# Patient Record
Sex: Male | Born: 1935 | Race: White | Hispanic: No | Marital: Married | Smoking: Former smoker
Health system: Southern US, Community
[De-identification: ages and names within clinical notes are randomized; demographics above are authoritative.]

## PROBLEM LIST (undated history)

## (undated) DIAGNOSIS — L570 Actinic keratosis: Secondary | ICD-10-CM

## (undated) DIAGNOSIS — E079 Disorder of thyroid, unspecified: Secondary | ICD-10-CM

## (undated) DIAGNOSIS — E119 Type 2 diabetes mellitus without complications: Secondary | ICD-10-CM

## (undated) DIAGNOSIS — I10 Essential (primary) hypertension: Secondary | ICD-10-CM

## (undated) DIAGNOSIS — N189 Chronic kidney disease, unspecified: Secondary | ICD-10-CM

## (undated) DIAGNOSIS — Z974 Presence of external hearing-aid: Secondary | ICD-10-CM

## (undated) DIAGNOSIS — T7840XA Allergy, unspecified, initial encounter: Secondary | ICD-10-CM

## (undated) DIAGNOSIS — K219 Gastro-esophageal reflux disease without esophagitis: Secondary | ICD-10-CM

## (undated) DIAGNOSIS — R42 Dizziness and giddiness: Secondary | ICD-10-CM

## (undated) DIAGNOSIS — E785 Hyperlipidemia, unspecified: Secondary | ICD-10-CM

## (undated) HISTORY — DX: Allergy, unspecified, initial encounter: T78.40XA

## (undated) HISTORY — DX: Actinic keratosis: L57.0

## (undated) HISTORY — PX: EYE SURGERY: SHX253

## (undated) HISTORY — DX: Chronic kidney disease, unspecified: N18.9

## (undated) HISTORY — DX: Essential (primary) hypertension: I10

## (undated) HISTORY — DX: Hyperlipidemia, unspecified: E78.5

## (undated) HISTORY — DX: Type 2 diabetes mellitus without complications: E11.9

## (undated) HISTORY — DX: Gastro-esophageal reflux disease without esophagitis: K21.9

## (undated) HISTORY — DX: Disorder of thyroid, unspecified: E07.9

---

## 2004-11-05 ENCOUNTER — Ambulatory Visit: Payer: Self-pay | Admitting: Family Medicine

## 2004-11-19 ENCOUNTER — Ambulatory Visit: Payer: Self-pay | Admitting: Family Medicine

## 2007-09-06 DIAGNOSIS — E785 Hyperlipidemia, unspecified: Secondary | ICD-10-CM | POA: Insufficient documentation

## 2007-10-18 DIAGNOSIS — I129 Hypertensive chronic kidney disease with stage 1 through stage 4 chronic kidney disease, or unspecified chronic kidney disease: Secondary | ICD-10-CM | POA: Insufficient documentation

## 2008-04-10 ENCOUNTER — Ambulatory Visit: Payer: Self-pay | Admitting: Family Medicine

## 2008-05-14 DIAGNOSIS — J309 Allergic rhinitis, unspecified: Secondary | ICD-10-CM | POA: Insufficient documentation

## 2008-10-03 ENCOUNTER — Ambulatory Visit: Payer: Self-pay | Admitting: Family Medicine

## 2009-02-26 ENCOUNTER — Ambulatory Visit: Payer: Self-pay | Admitting: Gastroenterology

## 2009-02-26 LAB — HM COLONOSCOPY

## 2009-05-24 ENCOUNTER — Emergency Department: Payer: Self-pay | Admitting: Emergency Medicine

## 2009-10-02 ENCOUNTER — Ambulatory Visit: Payer: Self-pay | Admitting: Ophthalmology

## 2009-11-03 ENCOUNTER — Ambulatory Visit: Payer: Self-pay | Admitting: Ophthalmology

## 2010-01-14 ENCOUNTER — Ambulatory Visit: Payer: Self-pay | Admitting: Ophthalmology

## 2010-04-29 LAB — CBC AND DIFFERENTIAL
HCT: 38 % — AB (ref 41–53)
Hemoglobin: 13.4 g/dL — AB (ref 13.5–17.5)
Neutrophils Absolute: 45 /uL
Platelets: 309 10*3/uL (ref 150–399)
WBC: 7.8 10*3/mL

## 2010-05-07 DIAGNOSIS — N184 Chronic kidney disease, stage 4 (severe): Secondary | ICD-10-CM | POA: Insufficient documentation

## 2011-10-13 ENCOUNTER — Ambulatory Visit: Payer: Self-pay | Admitting: Family Medicine

## 2013-11-22 LAB — TSH: TSH: 2.31 u[IU]/mL (ref <=5.90)

## 2013-11-22 LAB — BASIC METABOLIC PANEL
BUN: 31 mg/dL — AB (ref 4–21)
CREATININE: 2.1 mg/dL — AB (ref <=1.3)
Glucose: 116 mg/dL
POTASSIUM: 4.1 mmol/L (ref 3.4–5.3)
Sodium: 139 mmol/L (ref 137–147)

## 2013-11-22 LAB — HEPATIC FUNCTION PANEL
ALT: 24 U/L (ref 10–40)
AST: 26 U/L (ref 14–40)
Alkaline Phosphatase: 70 U/L (ref 25–125)
BILIRUBIN, TOTAL: 0.3 mg/dL

## 2014-06-11 LAB — PSA: PSA: 1

## 2014-12-18 LAB — HEMOGLOBIN A1C: HEMOGLOBIN A1C: 8.2 % — AB (ref 4.0–6.0)

## 2014-12-23 DIAGNOSIS — Z794 Long term (current) use of insulin: Secondary | ICD-10-CM | POA: Insufficient documentation

## 2014-12-23 DIAGNOSIS — R809 Proteinuria, unspecified: Secondary | ICD-10-CM | POA: Insufficient documentation

## 2014-12-23 DIAGNOSIS — H3500 Unspecified background retinopathy: Secondary | ICD-10-CM | POA: Insufficient documentation

## 2014-12-26 LAB — LIPID PANEL
CHOLESTEROL: 147 mg/dL (ref 0–200)
HDL: 54 mg/dL (ref 35–70)
LDL Cholesterol: 77 mg/dL
TRIGLYCERIDES: 80 mg/dL (ref 40–160)

## 2015-03-26 DIAGNOSIS — C4492 Squamous cell carcinoma of skin, unspecified: Secondary | ICD-10-CM

## 2015-03-26 HISTORY — DX: Squamous cell carcinoma of skin, unspecified: C44.92

## 2015-05-05 DIAGNOSIS — J449 Chronic obstructive pulmonary disease, unspecified: Secondary | ICD-10-CM | POA: Insufficient documentation

## 2015-05-05 DIAGNOSIS — L570 Actinic keratosis: Secondary | ICD-10-CM | POA: Insufficient documentation

## 2015-05-05 DIAGNOSIS — K219 Gastro-esophageal reflux disease without esophagitis: Secondary | ICD-10-CM | POA: Insufficient documentation

## 2015-05-05 DIAGNOSIS — M129 Arthropathy, unspecified: Secondary | ICD-10-CM | POA: Insufficient documentation

## 2015-05-05 DIAGNOSIS — N529 Male erectile dysfunction, unspecified: Secondary | ICD-10-CM | POA: Insufficient documentation

## 2015-07-02 ENCOUNTER — Encounter: Payer: Self-pay | Admitting: Family Medicine

## 2015-07-02 ENCOUNTER — Ambulatory Visit (INDEPENDENT_AMBULATORY_CARE_PROVIDER_SITE_OTHER): Payer: Medicare Other | Admitting: Family Medicine

## 2015-07-02 VITALS — BP 142/68 | HR 78 | Temp 98.6°F | Resp 16 | Ht 67.5 in | Wt 192.0 lb

## 2015-07-02 DIAGNOSIS — T148XXA Other injury of unspecified body region, initial encounter: Secondary | ICD-10-CM

## 2015-07-02 DIAGNOSIS — T148 Other injury of unspecified body region: Secondary | ICD-10-CM | POA: Diagnosis not present

## 2015-07-02 DIAGNOSIS — Z0000 Encounter for general adult medical examination without abnormal findings: Secondary | ICD-10-CM | POA: Diagnosis not present

## 2015-07-02 NOTE — Progress Notes (Signed)
Patient ID: Stephen White, male   DOB: 16-Mar-1936, 79 y.o.   MRN: [id number] Patient: Stephen White, Male    DOB: 03-23-1936, 38 y.o.   MRN: [id number] Visit Date: 07/02/2015  Today's Provider: Wilhemena Durie, MD   Chief Complaint  Patient presents with  . Annual Exam   Subjective:   Stephen White is a 79 y.o. male who presents today for his Subsequent Annual Wellness Visit. He feels well. He reports exercising  . He reports he is sleeping well.  Review of Systems  Constitutional: Negative.   HENT: Negative.   Eyes: Negative.   Respiratory: Negative.   Cardiovascular: Negative.   Gastrointestinal: Negative.   Endocrine: Negative.   Genitourinary: Negative.   Musculoskeletal: Negative.   Skin: Negative.   Allergic/Immunologic: Negative.   Neurological: Negative.   Hematological: Negative.   Psychiatric/Behavioral: Negative.          Patient Active Problem List   Diagnosis Date Noted  . Multiple actinic keratoses 05/05/2015  . Arthropathia 05/05/2015  . CAFL (chronic airflow limitation) 05/05/2015  . 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 Gastro-esophageal reflux disease without esophagitis 05/05/2015  . Adult hypothyroidism 05/05/2015  . Type 2 diabetes mellitus treated with insulin 05/29/2010  . Chronic kidney disease 05/07/2010  . Allergic rhinitis 05/14/2008  . Esophagitis, reflux 04/17/2008  . Essential (primary) hypertension 10/18/2007  . HLD (hyperlipidemia) 09/06/2007    History   Social History  . Marital Status: Married    Spouse Name: N/A  . Number of Children: N/A  . Years of Education: N/A   Occupational History  . Not on file.   Social History Main Topics  . Smoking status: Not on file  . Smokeless tobacco: Not on file  . Alcohol Use: Not on file  . Drug Use: Not on file  . Sexual Activity: Not on file   Other Topics Concern  . Not on file   Social History Narrative    Past Surgical History  Procedure Laterality Date  . Eye  surgery      His family history includes Cancer in his brother; Diabetes in his brother, brother, brother, brother, brother, father, mother, sister, and sister.    Outpatient Prescriptions Prior to Visit  Medication Sig Dispense Refill  . amLODipine (NORVASC) 5 MG tablet Take by mouth.    Marland Kitchen aspirin 81 MG tablet Take by mouth.    . fluticasone (FLONASE) 50 MCG/ACT nasal spray Place into the nose.    . insulin aspart (NOVOLOG) 100 UNIT/ML injection Inject into the skin.    Marland Kitchen insulin detemir (LEVEMIR) 100 UNIT/ML injection Inject into the skin.    Marland Kitchen linagliptin (TRADJENTA) 5 MG TABS tablet Take by mouth.    . losartan (COZAAR) 100 MG tablet Take by mouth.    . magnesium oxide (MAG-OX) 400 (241.3 MG) MG tablet Take by mouth.    . montelukast (SINGULAIR) 10 MG tablet Take by mouth.    . MULTIPLE VITAMIN PO Take by mouth.    Marland Kitchen omeprazole (PRILOSEC) 20 MG capsule Take by mouth.    . pravastatin (PRAVACHOL) 10 MG tablet Take by mouth.     No facility-administered medications prior to visit.    No Known Allergies  Patient Care Team: Jerrol Banana., MD as PCP - General (Family Medicine)  Objective:   Vitals: There were no vitals filed for this visit.  Physical Exam  Constitutional: He is oriented to person, place, and time. He appears well-developed  and well-nourished.  HENT:  Head: Normocephalic and atraumatic.  Right Ear: External ear normal.  Left Ear: External ear normal.  Mouth/Throat: Oropharynx is clear and moist.  Eyes: Conjunctivae and EOM are normal. Pupils are equal, round, and reactive to light.  Neck: Normal range of motion. Neck supple.  Cardiovascular: Normal rate, regular rhythm, normal heart sounds and intact distal pulses.   Pulmonary/Chest: Effort normal and breath sounds normal.  Abdominal: Soft. Bowel sounds are normal.  Genitourinary: Rectum normal, prostate normal and penis normal. No penile tenderness.  Musculoskeletal: Normal range of motion.   Neurological: He is alert and oriented to person, place, and time.  Skin: Skin is warm and dry.  Psychiatric: He has a normal mood and affect. His behavior is normal. Judgment and thought content normal.    Activities of Daily Living No flowsheet data found.  Fall Risk Assessment No flowsheet data found.   Depression Screen No flowsheet data found.  Cognitive Testing - 6-CIT    Year: 0 4 points  Month: 0 3 points  Memorize "Pia Mau, [address], [place]"  Time (within 1 hour:) 0 3 points  Count backwards from 20: 0 2 4 points  Name months of year: 0 2 4 points  Repeat Address: 0 2 4 6 8 10  points   Total Score: 6/28  Interpretation : Normal (0-7) Abnormal (8-28)    Assessment & Plan:     Annual Wellness Visit  Reviewed patient's Family Medical History Reviewed and updated list of patient's medical providers Assessment of cognitive impairment was done Assessed patient's functional ability Established a written schedule for health screening [place] Completed and Reviewed  Exercise Activities and Dietary recommendations Goals    None      Immunization History  Administered Date(s) Administered  . Pneumococcal Conjugate-13 12/24/2014  . Pneumococcal Polysaccharide-23 12/25/2012  . Zoster 12/25/2012    Health Maintenance  Topic Date Due  . FOOT EXAM  12/14/1948  . OPHTHALMOLOGY EXAM  12/14/1948  . URINE MICROALBUMIN  12/14/1948  . TETANUS/TDAP  12/14/1957  . HEMOGLOBIN A1C  06/19/2015  . INFLUENZA VACCINE  07/21/2015  . COLONOSCOPY  02/27/2019  . ZOSTAVAX  Completed  . PNA vac Low Risk Adult  Completed      Discussed health benefits of physical activity, and encouraged him to engage in regular exercise appropriate for his age and condition.    Miguel Aschoff MD [facility] 07/02/2015 11:03  AM  ------------------------------------------------------------------------------------------------------------

## 2015-09-11 ENCOUNTER — Other Ambulatory Visit: Payer: Self-pay

## 2015-09-11 MED ORDER — LOSARTAN POTASSIUM 100 MG PO TABS: 100.0000 mg | ORAL_TABLET | Freq: Every day | ORAL | Status: DC

## 2015-10-03 ENCOUNTER — Ambulatory Visit (INDEPENDENT_AMBULATORY_CARE_PROVIDER_SITE_OTHER): Payer: Medicare Other | Admitting: Physician Assistant

## 2015-10-03 ENCOUNTER — Encounter: Payer: Self-pay | Admitting: Physician Assistant

## 2015-10-03 VITALS — BP 158/70 | HR 68 | Temp 97.6°F | Resp 16 | Wt 196.0 lb

## 2015-10-03 DIAGNOSIS — H8110 Benign paroxysmal vertigo, unspecified ear: Secondary | ICD-10-CM

## 2015-10-03 DIAGNOSIS — Z23 Encounter for immunization: Secondary | ICD-10-CM

## 2015-10-03 NOTE — Patient Instructions (Signed)
Benign Positional Vertigo Vertigo is the feeling that you or your surroundings are moving when they are not. Benign positional vertigo is the most common form of vertigo. The cause of this condition is not serious (is benign). This condition is triggered by certain movements and positions (is positional). This condition can be dangerous if it occurs while you are doing something that could endanger you or others, such as driving.  CAUSES In many cases, the cause of this condition is not known. It may be caused by a disturbance in an area of the inner ear that helps your brain to sense movement and balance. This disturbance can be caused by a viral infection (labyrinthitis), head injury, or repetitive motion. RISK FACTORS This condition is more likely to develop in:  Women.  People who are 50 years of age or older. SYMPTOMS Symptoms of this condition usually happen when you move your head or your eyes in different directions. Symptoms may start suddenly, and they usually last for less than a minute. Symptoms may include:  Loss of balance and falling.  Feeling like you are spinning or moving.  Feeling like your surroundings are spinning or moving.  Nausea and vomiting.  Blurred vision.  Dizziness.  Involuntary eye movement (nystagmus). Symptoms can be mild and cause only slight annoyance, or they can be severe and interfere with daily life. Episodes of benign positional vertigo may return (recur) over time, and they may be triggered by certain movements. Symptoms may improve over time. DIAGNOSIS This condition is usually diagnosed by medical history and a physical exam of the head, neck, and ears. You may be referred to a health care provider who specializes in ear, nose, and throat (ENT) problems (otolaryngologist) or a provider who specializes in disorders of the nervous system (neurologist). You may have additional testing, including:  MRI.  A CT scan.  Eye movement tests. Your  health care provider may ask you to change positions quickly while he or she watches you for symptoms of benign positional vertigo, such as nystagmus. Eye movement may be tested with an electronystagmogram (ENG), caloric stimulation, the Dix-Hallpike test, or the roll test.  An electroencephalogram (EEG). This records electrical activity in your brain.  Hearing tests. TREATMENT Usually, your health care provider will treat this by moving your head in specific positions to adjust your inner ear back to normal. Surgery may be needed in severe cases, but this is rare. In some cases, benign positional vertigo may resolve on its own in 2-4 weeks. HOME CARE INSTRUCTIONS Safety  Move slowly.Avoid sudden body or head movements.  Avoid driving.  Avoid operating heavy machinery.  Avoid doing any tasks that would be dangerous to you or others if a vertigo episode would occur.  If you have trouble walking or keeping your balance, try using a cane for stability. If you feel dizzy or unstable, sit down right away.  Return to your normal activities as told by your health care provider. Ask your health care provider what activities are safe for you. General Instructions  Take over-the-counter and prescription medicines only as told by your health care provider.  Avoid certain positions or movements as told by your health care provider.  Drink enough fluid to keep your urine clear or pale yellow.  Keep all follow-up visits as told by your health care provider. This is important. SEEK MEDICAL CARE IF:  You have a fever.  Your condition gets worse or you develop new symptoms.  Your family or friends   notice any behavioral changes.  Your nausea or vomiting gets worse.  You have numbness or a "pins and needles" sensation. SEEK IMMEDIATE MEDICAL CARE IF:  You have difficulty speaking or moving.  You are always dizzy.  You faint.  You develop severe headaches.  You have weakness in your  legs or arms.  You have changes in your hearing or vision.  You develop a stiff neck.  You develop sensitivity to light.   This information is not intended to replace advice given to you by your health care provider. Make sure you discuss any questions you have with your health care provider.   Document Released: 09/13/2006 Document Revised: 08/27/2015 Document Reviewed: 03/31/2015 Elsevier Interactive Patient Education 2016 [employer]  

## 2015-10-03 NOTE — Progress Notes (Signed)
Patient: Stephen White Male    DOB: [birth year]   79 y.o.   MRN: [id number] Visit Date: 10/03/2015  Today's Provider: Mar Daring, PA-C   Chief Complaint  Patient presents with  . Dizziness   Subjective:    Dizziness This is a new problem. The current episode started 1 to 4 weeks ago. The problem occurs daily. The problem has been gradually worsening. Associated symptoms include congestion (mild) and vertigo. Pertinent negatives include no chest pain, chills, coughing, fatigue, fever, headaches, nausea, sore throat, visual change or vomiting. The symptoms are aggravated by standing (when laying down feels that head is turning). Treatments tried: when patient gets the spells, patient states he just closes his eyes.   He has had symptoms like this before a little while back. He does have over-the-counter meclizine at home for when the dizziness does not go away. He has not taken any with this most recent occurrence. He denies any headaches, visual changes, nausea or vomiting, chest pain, shortness of breath, numbness or weakness on one side of the body or the other.    No Known Allergies Previous Medications   AMLODIPINE (NORVASC) 5 MG TABLET       ASPIRIN 81 MG TABLET    Take by mouth.   FLUTICASONE (FLONASE) 50 MCG/ACT NASAL SPRAY    Place into the nose.   HYDROCHLOROTHIAZIDE (HYDRODIURIL) 25 MG TABLET       INSULIN ASPART (NOVOLOG) 100 UNIT/ML INJECTION    Inject into the skin.   INSULIN DETEMIR (LEVEMIR) 100 UNIT/ML INJECTION    Inject into the skin.   LOSARTAN (COZAAR) 100 MG TABLET    Take 1 tablet (100 mg total) by mouth daily.   MAGNESIUM OXIDE (MAG-OX) 400 (241.3 MG) MG TABLET    Take by mouth.   MULTIPLE VITAMIN PO    Take by mouth.   OMEPRAZOLE (PRILOSEC) 20 MG CAPSULE    Take by mouth.   ONE TOUCH ULTRA TEST TEST STRIP       PRAVASTATIN (PRAVACHOL) 10 MG TABLET        Review of Systems  Constitutional: Negative for fever, chills and fatigue.  HENT:  Positive for congestion (mild), hearing loss (uses hearing aids) and rhinorrhea. Negative for ear discharge, ear pain, postnasal drip, sinus pressure, sneezing, sore throat, tinnitus and trouble swallowing.   Eyes: Negative for pain, redness and visual disturbance.  Respiratory: Negative for cough, chest tightness, shortness of breath and wheezing.   Cardiovascular: Negative for chest pain and palpitations.  Gastrointestinal: Negative for nausea and vomiting.  Musculoskeletal: Negative for gait problem.  Neurological: Positive for dizziness, vertigo and light-headedness (when stands up). Negative for headaches.    Social History  Substance Use Topics  . Smoking status: Never Smoker   . Smokeless tobacco: Not on file  . Alcohol Use: No   Objective:   BP 158/70 mmHg  Pulse 68  Temp(Src) 97.6 F (36.4 C) (Oral)  Resp 16  Wt 196 lb (88.905 kg)  SpO2 97%  Physical Exam  Constitutional: He is oriented to person, place, and time. He appears well-developed and well-nourished. No distress.  HENT:  Head: Normocephalic and atraumatic.  Right Ear: Tympanic membrane, external ear and ear canal normal. Decreased hearing (uses hearing aids) is noted.  Left Ear: Tympanic membrane, external ear and ear canal normal. Decreased hearing (uses hearing aids) is noted.  Nose: Nose normal. Right sinus exhibits no maxillary sinus tenderness and no frontal sinus  tenderness. Left sinus exhibits no maxillary sinus tenderness and no frontal sinus tenderness.  Mouth/Throat: Uvula is midline, oropharynx is clear and moist and mucous membranes are normal. No oropharyngeal exudate.  Eyes: Conjunctivae and EOM are normal. Pupils are equal, round, and reactive to light. Right eye exhibits no discharge. Left eye exhibits no discharge. Right eye exhibits no nystagmus. Left eye exhibits no nystagmus.  Neck: Normal range of motion. Neck supple. No JVD present. Carotid bruit is not present. No tracheal deviation present.  No Brudzinski's sign and no Kernig's sign noted. No thyromegaly present.  Cardiovascular: Normal rate, regular rhythm and normal heart sounds.  Exam reveals no gallop and no friction rub.   No murmur heard. Pulmonary/Chest: Effort normal and breath sounds normal. No stridor. No respiratory distress. He has no wheezes. He has no rales.  Lymphadenopathy:    He has no cervical adenopathy.  Neurological: He is alert and oriented to person, place, and time. He has normal strength. No cranial nerve deficit or sensory deficit. He exhibits normal muscle tone. He displays a negative Romberg sign. Coordination and gait normal.  Skin: Skin is warm and dry. He is not diaphoretic.  Psychiatric: He has a normal mood and affect. His behavior is normal. Judgment and thought content normal.  Vitals reviewed.       Assessment & Plan:     1. BPPV (benign paroxysmal positional vertigo), unspecified laterality I do feel that his dizziness is most likely secondary to benign paroxysmal positional vertigo. Neurological exam today was normal. I did offer him a prescription of meclizine but he states he has medication at home already. He has not had any nausea or require an anti-emetic. I did advise him on how to perform Brandt-Daroff exercises at home. I also gave him a handout explaining BPPV. I did advise him to call the office if the dizziness persists and does not improve. I also advised him to call 911 if the dizziness worsens and becomes consistent, is associated with visual changes or headache, or not intractable vomiting. He voiced understanding and agrees.  2. Need for influenza vaccination Flu vaccine was given today in the office without complication. - Flu vaccine HIGH DOSE PF (Fluzone High dose)       Mar Daring, PA-C  [facility]

## 2015-10-15 ENCOUNTER — Other Ambulatory Visit: Payer: Self-pay

## 2015-10-15 MED ORDER — OMEPRAZOLE 20 MG PO CPDR: 20.0000 mg | DELAYED_RELEASE_CAPSULE | Freq: Every day | ORAL | Status: DC

## 2015-11-04 ENCOUNTER — Encounter: Payer: Self-pay | Admitting: Family Medicine

## 2015-11-04 ENCOUNTER — Ambulatory Visit (INDEPENDENT_AMBULATORY_CARE_PROVIDER_SITE_OTHER): Payer: Medicare Other | Admitting: Family Medicine

## 2015-11-04 VITALS — BP 136/68 | HR 60 | Temp 98.3°F | Resp 16 | Ht 68.0 in | Wt 196.0 lb

## 2015-11-04 DIAGNOSIS — E039 Hypothyroidism, unspecified: Secondary | ICD-10-CM

## 2015-11-04 DIAGNOSIS — E119 Type 2 diabetes mellitus without complications: Secondary | ICD-10-CM

## 2015-11-04 DIAGNOSIS — E785 Hyperlipidemia, unspecified: Secondary | ICD-10-CM

## 2015-11-04 DIAGNOSIS — Z794 Long term (current) use of insulin: Secondary | ICD-10-CM | POA: Diagnosis not present

## 2015-11-04 DIAGNOSIS — I10 Essential (primary) hypertension: Secondary | ICD-10-CM

## 2015-11-04 NOTE — Progress Notes (Signed)
Patient ID: Stephen White, male   DOB: 10-05-1936, 79 y.o.   MRN: [id number]       Patient: Stephen White Male    DOB: 1936-10-07   77 y.o.   MRN: [id number] Visit Date: 11/04/2015  Today's Provider: Wilhemena Durie, MD   Chief Complaint  Patient presents with  . Dizziness    1 month F/U.   Marland Kitchen Hyperlipidemia    4 month F/U.  Marland Kitchen Hypertension    4 month F/U.    Subjective:    Dizziness This is a recurrent problem. The current episode started 1 to 4 weeks ago. The problem occurs 2 to 4 times per day. The problem has been unchanged. Pertinent negatives include no chest pain or myalgias. The symptoms are aggravated by walking. He has tried rest and position changes (Patient reports that on last OV, Tawanna Sat recommended some exerices that he could do to help symptoms. ) for the symptoms. The treatment provided no relief.  Hyperlipidemia This is a new problem. The problem is controlled. Pertinent negatives include no chest pain, focal sensory loss, focal weakness, leg pain, myalgias or shortness of breath. Current antihyperlipidemic treatment includes statins. There are no compliance problems.   Hypertension This is a chronic problem. The problem is unchanged. The problem is controlled. Pertinent negatives include no chest pain or shortness of breath. There are no compliance problems.        No Known Allergies Previous Medications   AMLODIPINE (NORVASC) 5 MG TABLET       ASPIRIN 81 MG TABLET    Take by mouth.   FLUTICASONE (FLONASE) 50 MCG/ACT NASAL SPRAY    Place into the nose.   HYDROCHLOROTHIAZIDE (HYDRODIURIL) 25 MG TABLET       INSULIN ASPART (NOVOLOG) 100 UNIT/ML INJECTION    Inject into the skin.   INSULIN DETEMIR (LEVEMIR) 100 UNIT/ML INJECTION    Inject into the skin.   LOSARTAN (COZAAR) 100 MG TABLET    Take 1 tablet (100 mg total) by mouth daily.   MAGNESIUM OXIDE (MAG-OX) 400 (241.3 MG) MG TABLET    Take by mouth.   MULTIPLE VITAMIN PO    Take by mouth.   OMEPRAZOLE  (PRILOSEC) 20 MG CAPSULE    Take 1 capsule (20 mg total) by mouth daily.   ONE TOUCH ULTRA TEST TEST STRIP       PRAVASTATIN (PRAVACHOL) 10 MG TABLET        Review of Systems  Constitutional: Negative.   HENT: Negative.   Respiratory: Negative.  Negative for shortness of breath.   Cardiovascular: Negative.  Negative for chest pain.  Endocrine: Negative.   Musculoskeletal: Negative.  Negative for myalgias.  Neurological: Positive for dizziness. Negative for focal weakness.  Psychiatric/Behavioral: Negative.     Social History  Substance Use Topics  . Smoking status: Never Smoker   . Smokeless tobacco: Not on file  . Alcohol Use: No   Objective:   BP 136/68 mmHg  Pulse 60  Temp(Src) 98.3 F (36.8 C)  Resp 16  Ht 5\' 8"  (1.727 m)  Wt 196 lb (88.905 kg)  BMI 29.81 kg/m2  Physical Exam  Constitutional: He is oriented to person, place, and time. He appears well-developed and well-nourished.  HENT:  Head: Normocephalic and atraumatic.  Right Ear: External ear normal.  Left Ear: External ear normal.  Nose: Nose normal.  Mouth/Throat: Oropharynx is clear and moist.  Eyes:  Bilateral nystagmus.   Neck: Normal range of motion. Neck  supple.  Cardiovascular: Normal rate, regular rhythm, normal heart sounds and intact distal pulses.   Pulmonary/Chest: Effort normal and breath sounds normal.  Neurological: He is alert and oriented to person, place, and time. He has normal reflexes.  Grossly nonfocal. Mild nystagmus noted.  Skin: Skin is warm and dry.  Psychiatric: He has a normal mood and affect. His behavior is normal. Thought content normal.  Nursing note and vitals reviewed.       Assessment & Plan:     1. Essential (primary) hypertension  - Comprehensive metabolic panel  2. Hypothyroidism, unspecified hypothyroidism type  - TSH  3. HLD (hyperlipidemia)  - Lipid panel 4.TIIDM with retinopathy Per endocrine 5.Dizziness nondiagnostic evaluation today. May need  cardiology evaluation due to exertional nature.  I have done the exam and reviewed the above chart and it is accurate to the best of my knowledge.  I have done the exam and reviewed the above chart and it is accurate to the best of my knowledge.       Caro Brundidge Cranford Mon, MD  [facility]

## 2015-11-12 LAB — COMPREHENSIVE METABOLIC PANEL
ALBUMIN: 4.1 g/dL (ref 3.5–4.8)
ALT: 23 IU/L (ref 0–44)
AST: 24 IU/L (ref 0–40)
Albumin/Globulin Ratio: 1.7 (ref 1.1–2.5)
Alkaline Phosphatase: 60 IU/L (ref 39–117)
BILIRUBIN TOTAL: 0.4 mg/dL (ref 0.0–1.2)
BUN / CREAT RATIO: 12 (ref 10–22)
BUN: 28 mg/dL — AB (ref 8–27)
CO2: 26 mmol/L (ref 18–29)
CREATININE: 2.27 mg/dL — AB (ref 0.76–1.27)
Calcium: 9.5 mg/dL (ref 8.6–10.2)
Chloride: 100 mmol/L (ref 97–106)
GFR, EST AFRICAN AMERICAN: 31 mL/min/{1.73_m2} — AB (ref >59)
GFR, EST NON AFRICAN AMERICAN: 27 mL/min/{1.73_m2} — AB (ref >59)
Globulin, Total: 2.4 g/dL (ref 1.5–4.5)
Glucose: 103 mg/dL — ABNORMAL HIGH (ref 65–99)
Potassium: 4.6 mmol/L (ref 3.5–5.2)
Sodium: 141 mmol/L (ref 136–144)
TOTAL PROTEIN: 6.5 g/dL (ref 6.0–8.5)

## 2015-11-12 LAB — LIPID PANEL
Chol/HDL Ratio: 3.5 ratio units (ref 0.0–5.0)
Cholesterol, Total: 191 mg/dL (ref 100–199)
HDL: 55 mg/dL (ref >39)
LDL Calculated: 115 mg/dL — ABNORMAL HIGH (ref 0–99)
Triglycerides: 104 mg/dL (ref 0–149)
VLDL CHOLESTEROL CAL: 21 mg/dL (ref 5–40)

## 2015-11-12 LAB — TSH: TSH: 2.17 u[IU]/mL (ref 0.450–4.500)

## 2015-12-03 ENCOUNTER — Other Ambulatory Visit: Payer: Self-pay

## 2015-12-03 MED ORDER — PRAVASTATIN SODIUM 10 MG PO TABS: 10.0000 mg | ORAL_TABLET | Freq: Every day | ORAL | Status: DC

## 2015-12-26 DIAGNOSIS — E113313 Type 2 diabetes mellitus with moderate nonproliferative diabetic retinopathy with macular edema, bilateral: Secondary | ICD-10-CM | POA: Diagnosis not present

## 2015-12-31 DIAGNOSIS — R69 Illness, unspecified: Secondary | ICD-10-CM | POA: Diagnosis not present

## 2016-01-15 DIAGNOSIS — L578 Other skin changes due to chronic exposure to nonionizing radiation: Secondary | ICD-10-CM | POA: Diagnosis not present

## 2016-01-15 DIAGNOSIS — L820 Inflamed seborrheic keratosis: Secondary | ICD-10-CM | POA: Diagnosis not present

## 2016-01-15 DIAGNOSIS — L570 Actinic keratosis: Secondary | ICD-10-CM | POA: Diagnosis not present

## 2016-01-15 DIAGNOSIS — L812 Freckles: Secondary | ICD-10-CM | POA: Diagnosis not present

## 2016-01-15 DIAGNOSIS — L821 Other seborrheic keratosis: Secondary | ICD-10-CM | POA: Diagnosis not present

## 2016-01-15 DIAGNOSIS — D485 Neoplasm of uncertain behavior of skin: Secondary | ICD-10-CM | POA: Diagnosis not present

## 2016-01-15 DIAGNOSIS — D692 Other nonthrombocytopenic purpura: Secondary | ICD-10-CM | POA: Diagnosis not present

## 2016-01-15 DIAGNOSIS — Z85828 Personal history of other malignant neoplasm of skin: Secondary | ICD-10-CM | POA: Diagnosis not present

## 2016-01-16 DIAGNOSIS — Z794 Long term (current) use of insulin: Secondary | ICD-10-CM | POA: Diagnosis not present

## 2016-01-16 DIAGNOSIS — E113293 Type 2 diabetes mellitus with mild nonproliferative diabetic retinopathy without macular edema, bilateral: Secondary | ICD-10-CM | POA: Diagnosis not present

## 2016-01-23 DIAGNOSIS — Z794 Long term (current) use of insulin: Secondary | ICD-10-CM | POA: Diagnosis not present

## 2016-01-23 DIAGNOSIS — N184 Chronic kidney disease, stage 4 (severe): Secondary | ICD-10-CM | POA: Diagnosis not present

## 2016-01-23 DIAGNOSIS — E1129 Type 2 diabetes mellitus with other diabetic kidney complication: Secondary | ICD-10-CM | POA: Diagnosis not present

## 2016-01-23 DIAGNOSIS — E113293 Type 2 diabetes mellitus with mild nonproliferative diabetic retinopathy without macular edema, bilateral: Secondary | ICD-10-CM | POA: Diagnosis not present

## 2016-01-23 DIAGNOSIS — L84 Corns and callosities: Secondary | ICD-10-CM | POA: Diagnosis not present

## 2016-01-23 DIAGNOSIS — E1122 Type 2 diabetes mellitus with diabetic chronic kidney disease: Secondary | ICD-10-CM | POA: Diagnosis not present

## 2016-01-23 DIAGNOSIS — R809 Proteinuria, unspecified: Secondary | ICD-10-CM | POA: Diagnosis not present

## 2016-02-06 DIAGNOSIS — R69 Illness, unspecified: Secondary | ICD-10-CM | POA: Diagnosis not present

## 2016-02-24 DIAGNOSIS — L603 Nail dystrophy: Secondary | ICD-10-CM | POA: Diagnosis not present

## 2016-02-24 DIAGNOSIS — M79671 Pain in right foot: Secondary | ICD-10-CM | POA: Diagnosis not present

## 2016-02-24 DIAGNOSIS — Z794 Long term (current) use of insulin: Secondary | ICD-10-CM | POA: Diagnosis not present

## 2016-02-24 DIAGNOSIS — D2371 Other benign neoplasm of skin of right lower limb, including hip: Secondary | ICD-10-CM | POA: Diagnosis not present

## 2016-02-24 DIAGNOSIS — E11628 Type 2 diabetes mellitus with other skin complications: Secondary | ICD-10-CM | POA: Diagnosis not present

## 2016-03-04 DIAGNOSIS — H02831 Dermatochalasis of right upper eyelid: Secondary | ICD-10-CM | POA: Diagnosis not present

## 2016-03-04 DIAGNOSIS — H02834 Dermatochalasis of left upper eyelid: Secondary | ICD-10-CM | POA: Diagnosis not present

## 2016-03-18 DIAGNOSIS — Z794 Long term (current) use of insulin: Secondary | ICD-10-CM | POA: Diagnosis not present

## 2016-03-18 DIAGNOSIS — M79671 Pain in right foot: Secondary | ICD-10-CM | POA: Diagnosis not present

## 2016-03-18 DIAGNOSIS — D2371 Other benign neoplasm of skin of right lower limb, including hip: Secondary | ICD-10-CM | POA: Diagnosis not present

## 2016-03-18 DIAGNOSIS — E11628 Type 2 diabetes mellitus with other skin complications: Secondary | ICD-10-CM | POA: Diagnosis not present

## 2016-04-23 DIAGNOSIS — R69 Illness, unspecified: Secondary | ICD-10-CM | POA: Diagnosis not present

## 2016-05-03 ENCOUNTER — Ambulatory Visit (INDEPENDENT_AMBULATORY_CARE_PROVIDER_SITE_OTHER): Payer: Medicare HMO | Admitting: Family Medicine

## 2016-05-03 ENCOUNTER — Encounter: Payer: Self-pay | Admitting: Family Medicine

## 2016-05-03 VITALS — BP 138/66 | HR 76 | Temp 97.9°F | Resp 16 | Wt 200.0 lb

## 2016-05-03 DIAGNOSIS — I10 Essential (primary) hypertension: Secondary | ICD-10-CM | POA: Diagnosis not present

## 2016-05-03 DIAGNOSIS — Z794 Long term (current) use of insulin: Secondary | ICD-10-CM | POA: Diagnosis not present

## 2016-05-03 DIAGNOSIS — E119 Type 2 diabetes mellitus without complications: Secondary | ICD-10-CM

## 2016-05-03 DIAGNOSIS — M25511 Pain in right shoulder: Secondary | ICD-10-CM | POA: Diagnosis not present

## 2016-05-03 DIAGNOSIS — E785 Hyperlipidemia, unspecified: Secondary | ICD-10-CM | POA: Diagnosis not present

## 2016-05-03 MED ORDER — NAPROXEN 500 MG PO TABS: 500.0000 mg | ORAL_TABLET | Freq: Two times a day (BID) | ORAL | Status: DC

## 2016-05-03 NOTE — Patient Instructions (Signed)
Take naproxen (sent to pharmacy) for the rest of the month and stop. Call if you need a referral.

## 2016-05-03 NOTE — Progress Notes (Signed)
Patient ID: Stephen White, male   DOB: [birth year], 80 y.o.   MRN: [id number]    Subjective:  HPI  Hypertension, follow-up:  BP Readings from Last 3 Encounters:  05/03/16 138/66  11/04/15 136/68  10/03/15 158/70    He was last seen for hypertension 6 months ago.  BP at that visit was 136/68. Management since that visit includes none. He reports good compliance with treatment. He is not having side effects.  He is not exercising formally but is active. He is adherent to low salt diet.   Outside blood pressures are not being checked. He is experiencing none.  Patient denies chest pain, chest pressure/discomfort, claudication, dyspnea, exertional chest pressure/discomfort, fatigue, irregular heart beat, lower extremity edema, near-syncope, orthopnea, palpitations and paroxysmal nocturnal dyspnea.     Wt Readings from Last 3 Encounters:  05/03/16 200 lb (90.719 kg)  11/04/15 196 lb (88.905 kg)  10/03/15 196 lb (88.905 kg)  ------------------------------------------------------------------------  Pt is also having shoulder pain. Both shoulders bother him but the right is the one that is worse because he is right handed. He hurt his shoudler while doing yard work. He would like a referral to ortho.    Prior to Admission medications   Medication Sig Start Date End Date Taking? Authorizing Provider  amLODipine (NORVASC) 5 MG tablet  09/10/15  Yes Historical Provider, MD  aspirin 81 MG tablet Take by mouth. 02/03/11  Yes Historical Provider, MD  hydrochlorothiazide (HYDRODIURIL) 25 MG tablet  09/10/15  Yes Historical Provider, MD  insulin aspart (NOVOLOG) 100 UNIT/ML injection Inject into the skin.   Yes Historical Provider, MD  insulin detemir (LEVEMIR) 100 UNIT/ML injection Inject into the skin.   Yes Historical Provider, MD  losartan (COZAAR) 100 MG tablet Take 1 tablet (100 mg total) by mouth daily. 09/11/15  Yes Richard Maceo Pro., MD  magnesium oxide (MAG-OX) 400 (241.3 MG) MG  tablet Take by mouth. 12/24/14  Yes Historical Provider, MD  MULTIPLE VITAMIN PO Take by mouth. 02/03/11  Yes Historical Provider, MD  omeprazole (PRILOSEC) 20 MG capsule Take 1 capsule (20 mg total) by mouth daily. 10/15/15  Yes Richard Maceo Pro., MD  ONE TOUCH ULTRA TEST test strip  09/13/15  Yes Historical Provider, MD  pravastatin (PRAVACHOL) 10 MG tablet Take 1 tablet (10 mg total) by mouth daily. 12/03/15  Yes Richard Maceo Pro., MD  fluticasone [facility]) 50 MCG/ACT nasal spray Place into the nose. Reported on 05/03/2016 12/24/14   Historical Provider, MD    Patient Active Problem List   Diagnosis Date Noted  . Multiple actinic keratoses 05/05/2015  . Arthropathia 05/05/2015  . CAFL (chronic airflow limitation) ([place]) 05/05/2015  . 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 Gastro-esophageal reflux disease without esophagitis 05/05/2015  . Adult hypothyroidism 05/05/2015  . Retinopathy 12/23/2014  . Long term current use of insulin ([place]) 12/23/2014  . Microalbuminuria 12/23/2014  . Type 2 diabetes mellitus treated with insulin ([place]) 05/29/2010  . Chronic kidney disease 05/07/2010  . Allergic rhinitis 05/14/2008  . Esophagitis, reflux 04/17/2008  . Essential (primary) hypertension 10/18/2007  . HLD (hyperlipidemia) 09/06/2007    Past Medical History  Diagnosis Date  . Diabetes mellitus without complication ([place])   . GERD (gastroesophageal reflux disease)   . Hyperlipidemia   . Hypertension   . Chronic kidney disease   . Thyroid disease     Social History   Social History  . Marital Status: Married    Spouse Name: N/A  .  Number of Children: N/A  . Years of Education: N/A   Occupational History  . Not on file.   Social History Main Topics  . Smoking status: Never Smoker   . Smokeless tobacco: Not on file  . Alcohol Use: No  . Drug Use: No  . Sexual Activity: Not on file   Other Topics Concern  . Not on file   Social History Narrative    No Known  Allergies  Review of Systems  Constitutional: Negative.   HENT: Negative.   Eyes: Negative.   Respiratory: Negative.   Cardiovascular: Negative.   Gastrointestinal: Negative.   Genitourinary: Negative.   Musculoskeletal: Positive for joint pain.  Skin: Negative.   Neurological: Negative.   Endo/Heme/Allergies: Negative.   Psychiatric/Behavioral: Negative.     Immunization History  Administered Date(s) Administered  . Influenza, High Dose Seasonal PF 10/03/2015  . Pneumococcal Conjugate-13 12/24/2014  . Pneumococcal Polysaccharide-23 12/25/2012  . Td 07/02/2015  . Zoster 12/25/2012   Objective:  BP 138/66 mmHg  Pulse 76  Temp(Src) 97.9 F (36.6 C) (Oral)  Resp 16  Wt 200 lb (90.719 kg)  Physical Exam  Constitutional: He is oriented to person, place, and time and well-developed, well-nourished, and in no distress.  HENT:  Head: Normocephalic and atraumatic.  Right Ear: External ear normal.  Left Ear: External ear normal.  Nose: Nose normal.  Eyes: Conjunctivae are normal.  Neck: Normal range of motion. Neck supple.  Cardiovascular: Normal rate, regular rhythm, normal heart sounds and intact distal pulses.   Pulmonary/Chest: Effort normal and breath sounds normal.  Musculoskeletal: Normal range of motion.   Exam of his shoulders and upper extremities is completely normal. Full range of motion and no point tenderness  Neurological: He is alert and oriented to person, place, and time. He has normal reflexes. Gait normal. GCS score is 15.  Skin: Skin is warm and dry.  Psychiatric: Mood, memory, affect and judgment normal.    Lab Results  Component Value Date   WBC 7.8 04/29/2010   HGB 13.4* 04/29/2010   HCT 38* 04/29/2010   PLT 309 04/29/2010   GLUCOSE 103* 11/11/2015   CHOL 191 11/11/2015   TRIG 104 11/11/2015   HDL 55 11/11/2015   LDLCALC 115* 11/11/2015   TSH 2.170 11/11/2015   PSA 1.0 06/11/2014   HGBA1C 8.2* 12/18/2014    CMP     Component Value  Date/Time   NA 141 11/11/2015 0950   K 4.6 11/11/2015 0950   CL 100 11/11/2015 0950   CO2 26 11/11/2015 0950   GLUCOSE 103* 11/11/2015 0950   BUN 28* 11/11/2015 0950   CREATININE 2.27* 11/11/2015 0950   CREATININE 2.1* 11/22/2013   CALCIUM 9.5 11/11/2015 0950   PROT 6.5 11/11/2015 0950   ALBUMIN 4.1 11/11/2015 0950   AST 24 11/11/2015 0950   ALT 23 11/11/2015 0950   ALKPHOS 60 11/11/2015 0950   BILITOT 0.4 11/11/2015 0950   GFRNONAA 27* 11/11/2015 0950   GFRAA 31* 11/11/2015 0950    Assessment and Plan :  1. Essential (primary) hypertension Stable.  2. HLD (hyperlipidemia)   3. Type 2 diabetes mellitus treated with insulin ([place]) Followed by Endocrine  4. Right shoulder pain Take Naproxen until the end of the month then stop. If not better may need referral to ortho. - naproxen (NAPROSYN) 500 MG tablet; Take 1 tablet (500 mg total) by mouth 2 (two) times daily with a meal.  Dispense: 60 tablet; Refill: 0 - DG Shoulder  Right; Future  Patient was seen and examined by Dr. Miguel Aschoff, and noted scribed by Webb Laws, [place] MD [facility] 05/03/2016 1:39 PM

## 2016-06-18 DIAGNOSIS — E113293 Type 2 diabetes mellitus with mild nonproliferative diabetic retinopathy without macular edema, bilateral: Secondary | ICD-10-CM | POA: Diagnosis not present

## 2016-06-18 DIAGNOSIS — Z794 Long term (current) use of insulin: Secondary | ICD-10-CM | POA: Diagnosis not present

## 2016-06-25 DIAGNOSIS — E1122 Type 2 diabetes mellitus with diabetic chronic kidney disease: Secondary | ICD-10-CM | POA: Diagnosis not present

## 2016-06-25 DIAGNOSIS — Z794 Long term (current) use of insulin: Secondary | ICD-10-CM | POA: Diagnosis not present

## 2016-06-25 DIAGNOSIS — R809 Proteinuria, unspecified: Secondary | ICD-10-CM | POA: Diagnosis not present

## 2016-06-25 DIAGNOSIS — E1129 Type 2 diabetes mellitus with other diabetic kidney complication: Secondary | ICD-10-CM | POA: Diagnosis not present

## 2016-06-25 DIAGNOSIS — E113293 Type 2 diabetes mellitus with mild nonproliferative diabetic retinopathy without macular edema, bilateral: Secondary | ICD-10-CM | POA: Diagnosis not present

## 2016-06-25 DIAGNOSIS — E1121 Type 2 diabetes mellitus with diabetic nephropathy: Secondary | ICD-10-CM | POA: Diagnosis not present

## 2016-06-25 DIAGNOSIS — N183 Chronic kidney disease, stage 3 (moderate): Secondary | ICD-10-CM | POA: Diagnosis not present

## 2016-07-03 DIAGNOSIS — R609 Edema, unspecified: Secondary | ICD-10-CM | POA: Diagnosis not present

## 2016-07-03 DIAGNOSIS — R69 Illness, unspecified: Secondary | ICD-10-CM | POA: Diagnosis not present

## 2016-07-03 DIAGNOSIS — Z954 Presence of other heart-valve replacement: Secondary | ICD-10-CM | POA: Diagnosis not present

## 2016-07-14 DIAGNOSIS — D1800 Hemangioma unspecified site: Secondary | ICD-10-CM | POA: Diagnosis not present

## 2016-07-14 DIAGNOSIS — L570 Actinic keratosis: Secondary | ICD-10-CM | POA: Diagnosis not present

## 2016-07-14 DIAGNOSIS — L812 Freckles: Secondary | ICD-10-CM | POA: Diagnosis not present

## 2016-07-14 DIAGNOSIS — L820 Inflamed seborrheic keratosis: Secondary | ICD-10-CM | POA: Diagnosis not present

## 2016-07-14 DIAGNOSIS — Z1283 Encounter for screening for malignant neoplasm of skin: Secondary | ICD-10-CM | POA: Diagnosis not present

## 2016-07-14 DIAGNOSIS — Z85828 Personal history of other malignant neoplasm of skin: Secondary | ICD-10-CM | POA: Diagnosis not present

## 2016-07-14 DIAGNOSIS — L578 Other skin changes due to chronic exposure to nonionizing radiation: Secondary | ICD-10-CM | POA: Diagnosis not present

## 2016-07-14 DIAGNOSIS — L821 Other seborrheic keratosis: Secondary | ICD-10-CM | POA: Diagnosis not present

## 2016-07-14 DIAGNOSIS — D229 Melanocytic nevi, unspecified: Secondary | ICD-10-CM | POA: Diagnosis not present

## 2016-07-23 DIAGNOSIS — E113313 Type 2 diabetes mellitus with moderate nonproliferative diabetic retinopathy with macular edema, bilateral: Secondary | ICD-10-CM | POA: Diagnosis not present

## 2016-08-06 ENCOUNTER — Other Ambulatory Visit: Payer: Self-pay

## 2016-08-06 MED ORDER — PRAVASTATIN SODIUM 10 MG PO TABS: 10.0000 mg | ORAL_TABLET | Freq: Every day | ORAL | 3 refills | Status: DC

## 2016-08-06 NOTE — Telephone Encounter (Signed)
This is a Dr. Alben Spittle pt.   Thanks,   -Mickel Baas

## 2016-08-11 DIAGNOSIS — I10 Essential (primary) hypertension: Secondary | ICD-10-CM | POA: Diagnosis not present

## 2016-08-11 DIAGNOSIS — E119 Type 2 diabetes mellitus without complications: Secondary | ICD-10-CM | POA: Diagnosis not present

## 2016-08-11 DIAGNOSIS — K219 Gastro-esophageal reflux disease without esophagitis: Secondary | ICD-10-CM | POA: Diagnosis not present

## 2016-08-11 DIAGNOSIS — Z0000 Encounter for general adult medical examination without abnormal findings: Secondary | ICD-10-CM | POA: Diagnosis not present

## 2016-08-11 DIAGNOSIS — Z794 Long term (current) use of insulin: Secondary | ICD-10-CM | POA: Diagnosis not present

## 2016-09-01 ENCOUNTER — Other Ambulatory Visit: Payer: Self-pay

## 2016-09-01 MED ORDER — LOSARTAN POTASSIUM 100 MG PO TABS: 100.0000 mg | ORAL_TABLET | Freq: Every day | ORAL | 3 refills | Status: DC

## 2016-09-15 DIAGNOSIS — L309 Dermatitis, unspecified: Secondary | ICD-10-CM | POA: Diagnosis not present

## 2016-09-15 DIAGNOSIS — L821 Other seborrheic keratosis: Secondary | ICD-10-CM | POA: Diagnosis not present

## 2016-09-15 DIAGNOSIS — R69 Illness, unspecified: Secondary | ICD-10-CM | POA: Diagnosis not present

## 2016-09-15 DIAGNOSIS — J029 Acute pharyngitis, unspecified: Secondary | ICD-10-CM | POA: Diagnosis not present

## 2016-09-15 DIAGNOSIS — R609 Edema, unspecified: Secondary | ICD-10-CM | POA: Diagnosis not present

## 2016-09-15 DIAGNOSIS — K592 Neurogenic bowel, not elsewhere classified: Secondary | ICD-10-CM | POA: Diagnosis not present

## 2016-09-15 DIAGNOSIS — D1800 Hemangioma unspecified site: Secondary | ICD-10-CM | POA: Diagnosis not present

## 2016-09-15 DIAGNOSIS — L570 Actinic keratosis: Secondary | ICD-10-CM | POA: Diagnosis not present

## 2016-09-15 DIAGNOSIS — G8252 Quadriplegia, C1-C4 incomplete: Secondary | ICD-10-CM | POA: Diagnosis not present

## 2016-09-15 DIAGNOSIS — L578 Other skin changes due to chronic exposure to nonionizing radiation: Secondary | ICD-10-CM | POA: Diagnosis not present

## 2016-09-15 DIAGNOSIS — D229 Melanocytic nevi, unspecified: Secondary | ICD-10-CM | POA: Diagnosis not present

## 2016-09-29 DIAGNOSIS — Z794 Long term (current) use of insulin: Secondary | ICD-10-CM | POA: Diagnosis not present

## 2016-09-29 DIAGNOSIS — E113293 Type 2 diabetes mellitus with mild nonproliferative diabetic retinopathy without macular edema, bilateral: Secondary | ICD-10-CM | POA: Diagnosis not present

## 2016-10-06 DIAGNOSIS — E1122 Type 2 diabetes mellitus with diabetic chronic kidney disease: Secondary | ICD-10-CM | POA: Diagnosis not present

## 2016-10-06 DIAGNOSIS — Z794 Long term (current) use of insulin: Secondary | ICD-10-CM | POA: Diagnosis not present

## 2016-10-06 DIAGNOSIS — E1121 Type 2 diabetes mellitus with diabetic nephropathy: Secondary | ICD-10-CM | POA: Diagnosis not present

## 2016-10-06 DIAGNOSIS — E1129 Type 2 diabetes mellitus with other diabetic kidney complication: Secondary | ICD-10-CM | POA: Diagnosis not present

## 2016-10-06 DIAGNOSIS — N183 Chronic kidney disease, stage 3 (moderate): Secondary | ICD-10-CM | POA: Diagnosis not present

## 2016-10-06 DIAGNOSIS — E113393 Type 2 diabetes mellitus with moderate nonproliferative diabetic retinopathy without macular edema, bilateral: Secondary | ICD-10-CM | POA: Diagnosis not present

## 2016-10-06 DIAGNOSIS — R809 Proteinuria, unspecified: Secondary | ICD-10-CM | POA: Diagnosis not present

## 2016-10-23 DIAGNOSIS — R69 Illness, unspecified: Secondary | ICD-10-CM | POA: Diagnosis not present

## 2016-10-23 DIAGNOSIS — G8252 Quadriplegia, C1-C4 incomplete: Secondary | ICD-10-CM | POA: Diagnosis not present

## 2016-10-23 DIAGNOSIS — K592 Neurogenic bowel, not elsewhere classified: Secondary | ICD-10-CM | POA: Diagnosis not present

## 2016-10-23 DIAGNOSIS — R609 Edema, unspecified: Secondary | ICD-10-CM | POA: Diagnosis not present

## 2016-10-23 DIAGNOSIS — J029 Acute pharyngitis, unspecified: Secondary | ICD-10-CM | POA: Diagnosis not present

## 2016-11-03 ENCOUNTER — Ambulatory Visit (INDEPENDENT_AMBULATORY_CARE_PROVIDER_SITE_OTHER): Payer: Medicare HMO | Admitting: Family Medicine

## 2016-11-03 ENCOUNTER — Encounter: Payer: Self-pay | Admitting: Family Medicine

## 2016-11-03 ENCOUNTER — Ambulatory Visit: Payer: Medicare HMO | Admitting: Family Medicine

## 2016-11-03 VITALS — BP 128/64 | HR 72 | Temp 98.6°F | Resp 16 | Wt 202.0 lb

## 2016-11-03 DIAGNOSIS — Z794 Long term (current) use of insulin: Secondary | ICD-10-CM

## 2016-11-03 DIAGNOSIS — R143 Flatulence: Secondary | ICD-10-CM | POA: Diagnosis not present

## 2016-11-03 DIAGNOSIS — Z23 Encounter for immunization: Secondary | ICD-10-CM | POA: Diagnosis not present

## 2016-11-03 DIAGNOSIS — E7800 Pure hypercholesterolemia, unspecified: Secondary | ICD-10-CM | POA: Diagnosis not present

## 2016-11-03 DIAGNOSIS — I10 Essential (primary) hypertension: Secondary | ICD-10-CM | POA: Diagnosis not present

## 2016-11-03 DIAGNOSIS — E119 Type 2 diabetes mellitus without complications: Secondary | ICD-10-CM

## 2016-11-03 LAB — POCT GLYCOSYLATED HEMOGLOBIN (HGB A1C): HEMOGLOBIN A1C: 7.1

## 2016-11-03 NOTE — Progress Notes (Signed)
Patient: Stephen White Male    DOB: [birth year]   80 y.o.   MRN: [id number] Visit Date: 11/03/2016  Today's Provider: Wilhemena Durie, MD   Chief Complaint  Patient presents with  . Hyperlipidemia  . Diabetes  . Follow-up  . Hypertension   Subjective:    HPI  Diabetes Mellitus Type II, Follow-up:   Lab Results  Component Value Date   HGBA1C 7.1 11/03/2016   HGBA1C 8.2 (A) 12/18/2014   Last seen for diabetes 6 months ago.  Management since then includes continue medication. He reports good compliance with treatment. He is not having side effects.  Current symptoms include none and have been stable. Home blood sugar records: fasting range: 80-130's  Episodes of hypoglycemia? yes    Current Insulin Regimen: Novolin N and Novolin R Weight trend: stable Current diet: in general, an "unhealthy" diet Current exercise: none  ------------------------------------------------------------------------   Hypertension, follow-up:  BP Readings from Last 3 Encounters:  11/03/16 128/64  05/03/16 138/66  11/04/15 136/68    He was last seen for hypertension 6 months ago.  BP at that visit was 138/66. Management since that visit includes continue medication.He reports good compliance with treatment. He is not having side effects.  He is not exercising. He is not adherent to low salt diet.   Outside blood pressures are not being checked. He is experiencing none.  Patient denies chest pain, irregular heart beat and palpitations.   Cardiovascular risk factors include advanced age (older than 57 for men, 14 for women), diabetes mellitus, dyslipidemia and male gender.  Use of agents associated with hypertension: none.   ------------------------------------------------------------------------    Lipid/Cholesterol, Follow-up:   Last seen for this 6 months ago.  Management since that visit includes continue medications.  Last Lipid Panel:    Component Value Date/Time     CHOL 191 11/11/2015 0950   TRIG 104 11/11/2015 0950   HDL 55 11/11/2015 0950   CHOLHDL 3.5 11/11/2015 0950   LDLCALC 115 (H) 11/11/2015 0950    He reports good compliance with treatment. He is not having side effects.   Wt Readings from Last 3 Encounters:  11/03/16 202 lb (91.6 kg)  05/03/16 200 lb (90.7 kg)  11/04/15 196 lb (88.9 kg)   In last couple of months pt has increased flatulence. No other GI symptom. Weight stable. ------------------------------------------------------------------------    Previous Medications   AMLODIPINE (NORVASC) 5 MG TABLET       ASPIRIN 81 MG TABLET    Take by mouth.   FLUTICASONE (FLONASE) 50 MCG/ACT NASAL SPRAY    Place into the nose. Reported on 05/03/2016   HYDROCHLOROTHIAZIDE (HYDRODIURIL) 25 MG TABLET       INSULIN NPH HUMAN (HUMULIN N,NOVOLIN N) 100 UNIT/ML INJECTION    Inject into the skin.   INSULIN REGULAR (NOVOLIN R,HUMULIN R) 250 UNITS/2.5ML (100 UNITS/ML) INJECTION    Inject 15 units at breakfast, 15 units at lunch and 20 units at supper   LOSARTAN (COZAAR) 100 MG TABLET    Take 1 tablet (100 mg total) by mouth daily.   MAGNESIUM OXIDE (MAG-OX) 400 (241.3 MG) MG TABLET    Take by mouth.   MULTIPLE VITAMIN PO    Take by mouth.   NAPROXEN (NAPROSYN) 500 MG TABLET    Take 1 tablet (500 mg total) by mouth 2 (two) times daily with a meal.   OMEPRAZOLE (PRILOSEC) 20 MG CAPSULE    Take 1 capsule (20 mg total) by mouth daily.  ONE TOUCH ULTRA TEST TEST STRIP       PRAVASTATIN (PRAVACHOL) 10 MG TABLET    Take 1 tablet (10 mg total) by mouth daily.    Review of Systems  Constitutional: Negative.   Eyes: Negative.   Respiratory: Negative.   Cardiovascular: Negative.   Gastrointestinal:       Flatulence  Endocrine: Negative.   Genitourinary: Negative.   Allergic/Immunologic: Negative.   Psychiatric/Behavioral: Negative.     Social History  Substance Use Topics  . Smoking status: Never Smoker  . Smokeless tobacco: Not on file  .  Alcohol use No   Objective:   BP 128/64 (BP Location: Right Arm, Patient Position: Sitting, Cuff Size: Normal)   Pulse 72   Temp 98.6 F (37 C) (Oral)   Resp 16   Wt 202 lb (91.6 kg)   BMI 30.71 kg/m   Physical Exam  Constitutional: He is oriented to person, place, and time. He appears well-developed and well-nourished.  HENT:  Head: Normocephalic and atraumatic.  Right Ear: External ear normal.  Left Ear: External ear normal.  Mouth/Throat: Oropharynx is clear and moist.  Neck: Normal range of motion. Neck supple.  Cardiovascular: Normal rate, regular rhythm, normal heart sounds and intact distal pulses.   Pulmonary/Chest: Effort normal and breath sounds normal.  Abdominal: Soft.  Musculoskeletal: Normal range of motion.  Neurological: He is alert and oriented to person, place, and time. He has normal reflexes.  Skin: Skin is warm and dry.  Psychiatric: He has a normal mood and affect. His behavior is normal. Judgment and thought content normal.        Assessment & Plan:     1. Essential (primary) hypertension Stable. Continue Medication  2. Type 2 diabetes mellitus treated with insulin (H[facility]) Stable Continue medication. Followed by [name] /endocrinology. - POCT HgB A1C--7.1 today.  3. Pure hypercholesterolemia Stable. Continue medication  4. Need for influenza vaccination  - Flu vaccine HIGH DOSE PF  5. Flatulence Stop Omeprazole. Start Probiotic once daily. Follow up in 1 month. Consider labs and GI w/u if needed.  Follow up: Return in about 1 month (around 12/03/2016) for flatulence.    I have done the exam and reviewed the chart and it is accurate to the best of my knowledge. Development worker, community has been used and  any errors in dictation or transcription are unintentional. Miguel Aschoff M.D. [facility]

## 2016-11-04 DIAGNOSIS — I10 Essential (primary) hypertension: Secondary | ICD-10-CM | POA: Diagnosis not present

## 2016-11-04 DIAGNOSIS — E7800 Pure hypercholesterolemia, unspecified: Secondary | ICD-10-CM | POA: Diagnosis not present

## 2016-11-05 ENCOUNTER — Telehealth: Payer: Self-pay

## 2016-11-05 LAB — COMPREHENSIVE METABOLIC PANEL
A/G RATIO: 1.5 (ref 1.2–2.2)
ALBUMIN: 4 g/dL (ref 3.5–4.8)
ALK PHOS: 63 IU/L (ref 39–117)
ALT: 16 IU/L (ref 0–44)
AST: 17 IU/L (ref 0–40)
BILIRUBIN TOTAL: 0.3 mg/dL (ref 0.0–1.2)
BUN / CREAT RATIO: 17 (ref 10–24)
BUN: 36 mg/dL — AB (ref 8–27)
CHLORIDE: 102 mmol/L (ref 96–106)
CO2: 26 mmol/L (ref 18–29)
Calcium: 9.3 mg/dL (ref 8.6–10.2)
Creatinine, Ser: 2.18 mg/dL — ABNORMAL HIGH (ref 0.76–1.27)
GFR calc Af Amer: 33 mL/min/{1.73_m2} — ABNORMAL LOW (ref >59)
GFR calc non Af Amer: 28 mL/min/{1.73_m2} — ABNORMAL LOW (ref >59)
GLUCOSE: 141 mg/dL — AB (ref 65–99)
Globulin, Total: 2.6 g/dL (ref 1.5–4.5)
POTASSIUM: 4.7 mmol/L (ref 3.5–5.2)
Sodium: 142 mmol/L (ref 134–144)
Total Protein: 6.6 g/dL (ref 6.0–8.5)

## 2016-11-05 LAB — LIPID PANEL
Chol/HDL Ratio: 3.2 ratio units (ref 0.0–5.0)
Cholesterol, Total: 157 mg/dL (ref 100–199)
HDL: 49 mg/dL (ref >39)
LDL Calculated: 89 mg/dL (ref 0–99)
Triglycerides: 93 mg/dL (ref 0–149)
VLDL Cholesterol Cal: 19 mg/dL (ref 5–40)

## 2016-11-05 LAB — CBC WITH DIFFERENTIAL
BASOS ABS: 0.1 10*3/uL (ref 0.0–0.2)
Basos: 1 %
EOS (ABSOLUTE): 0.9 10*3/uL — AB (ref 0.0–0.4)
Eos: 11 %
Hematocrit: 41.9 % (ref 37.5–51.0)
Hemoglobin: 14.4 g/dL (ref 12.6–17.7)
Immature Grans (Abs): 0 10*3/uL (ref 0.0–0.1)
Immature Granulocytes: 0 %
LYMPHS ABS: 2.5 10*3/uL (ref 0.7–3.1)
Lymphs: 31 %
MCH: 30.8 pg (ref 26.6–33.0)
MCHC: 34.4 g/dL (ref 31.5–35.7)
MCV: 90 fL (ref 79–97)
MONOS ABS: 0.9 10*3/uL (ref 0.1–0.9)
Monocytes: 11 %
NEUTROS ABS: 3.8 10*3/uL (ref 1.4–7.0)
NEUTROS PCT: 46 %
RBC: 4.67 x10E6/uL (ref 4.14–5.80)
RDW: 13.7 % (ref 12.3–15.4)
WBC: 8.1 10*3/uL (ref 3.4–10.8)

## 2016-11-05 LAB — TSH: TSH: 3.35 u[IU]/mL (ref 0.450–4.500)

## 2016-11-05 NOTE — Telephone Encounter (Signed)
Patient has been advised. KW 

## 2016-11-05 NOTE — Telephone Encounter (Signed)
-----   Message from Trinna Post, [place] sent at 11/05/2016 10:46 AM EST ----- Cmp showed slightly high glucose, unsure if this was fasting. However, A1C is 7.1 and seems to be at goal per endocrinology. TSH normal. Lipid panel normal. CBC normal except for slightly high eosinophils. In absence of any unusual symptoms, likely allergies.

## 2016-11-17 DIAGNOSIS — Z85828 Personal history of other malignant neoplasm of skin: Secondary | ICD-10-CM | POA: Diagnosis not present

## 2016-11-17 DIAGNOSIS — L578 Other skin changes due to chronic exposure to nonionizing radiation: Secondary | ICD-10-CM | POA: Diagnosis not present

## 2016-11-17 DIAGNOSIS — L821 Other seborrheic keratosis: Secondary | ICD-10-CM | POA: Diagnosis not present

## 2016-11-17 DIAGNOSIS — L570 Actinic keratosis: Secondary | ICD-10-CM | POA: Diagnosis not present

## 2016-11-17 DIAGNOSIS — L820 Inflamed seborrheic keratosis: Secondary | ICD-10-CM | POA: Diagnosis not present

## 2016-11-25 ENCOUNTER — Telehealth: Payer: Self-pay | Admitting: Family Medicine

## 2016-11-25 MED ORDER — RANITIDINE HCL 150 MG PO CAPS
150.0000 mg | ORAL_CAPSULE | Freq: Two times a day (BID) | ORAL | 5 refills | Status: DC
Start: 2016-11-25 — End: 2017-06-07

## 2016-11-25 NOTE — Telephone Encounter (Signed)
Advised patient as below. Patient requested that Rx be sent. Patient also has an appt scheduled with Dr. Rosanna Randy on 12/18.

## 2016-11-25 NOTE — Telephone Encounter (Signed)
Pt is still having acid reflux.  Everything he has tried is not helping.  Please advise   [facility]   Callback is [phone] or [phone]  Thanks Con Memos

## 2016-11-25 NOTE — Telephone Encounter (Signed)
Left message to call back  

## 2016-11-25 NOTE — Telephone Encounter (Signed)
Zantac 150mg  BID and see me next week or see Adriana tomorrow.

## 2016-11-25 NOTE — Telephone Encounter (Signed)
Patient reports that he his still off of the omeprazole, and he is off of the probiotic. He reports that the probiotic made him feel worse so he stopped taking it. Patient is wanting to know what he can take to help with GERD symptoms. Patient reports that it is worse after eating. Please advise. Thanks!

## 2016-11-26 ENCOUNTER — Encounter: Payer: Self-pay | Admitting: Physician Assistant

## 2016-11-26 ENCOUNTER — Ambulatory Visit (INDEPENDENT_AMBULATORY_CARE_PROVIDER_SITE_OTHER): Payer: Medicare HMO | Admitting: Physician Assistant

## 2016-11-26 VITALS — BP 138/72 | HR 64 | Temp 98.5°F | Resp 16 | Wt 197.0 lb

## 2016-11-26 DIAGNOSIS — K219 Gastro-esophageal reflux disease without esophagitis: Secondary | ICD-10-CM | POA: Diagnosis not present

## 2016-11-26 DIAGNOSIS — K5900 Constipation, unspecified: Secondary | ICD-10-CM

## 2016-11-26 MED ORDER — POLYETHYLENE GLYCOL 3350 17 GM/SCOOP PO POWD: 17.0000 g | Freq: Every day | ORAL | 0 refills | Status: AC

## 2016-11-26 NOTE — Progress Notes (Signed)
Patient: Stephen White Male    DOB: Apr 24, 1936   80 y.o.   MRN: [id number] Visit Date: 11/26/2016  Today's Provider: Trinna Post, PA-C   Chief Complaint  Patient presents with  . Gastroesophageal Reflux   Subjective:    Gastroesophageal Reflux  He complains of abdominal pain, belching, coughing, early satiety and heartburn. He reports no choking, no hoarse voice, no nausea or no wheezing. This is a chronic problem. The problem has been gradually worsening. The heartburn is of moderate intensity. The heartburn does not wake him from sleep. Pertinent negatives include no fatigue. He has tried a PPI for the symptoms.   Patient is a 80 y/o overweight male with hx of HTN, GERD, and Type II DM complaining of heartburn today. He says he has felt this way the past couple of weeks. There is a burning pain in his epigastric region, especially after he eats and made worse when he lies down. He says it is triggered by greasy foods. No melena/hematochezia. No Nausea or vomiting. He has discussed this problem with Dr. Rosanna Randy and tried omeprazole, which did help, however gave him the unpleasant side effect of flatulence and was discontinued. Tried probiotic, which he states gave him "rotten belches" and so he stopped this. Prescription for Zantac was sent to [facility], but for some reason, patient has not started this. Also c/o constipation today, not having bowel movements despite taking laxative twice.     No Known Allergies   Current Outpatient Prescriptions:  .  amLODipine (NORVASC) 5 MG tablet, , Disp: , Rfl:  .  aspirin 81 MG tablet, Take by mouth., Disp: , Rfl:  .  fluticasone (FLONASE) 50 MCG/ACT nasal spray, Place into the nose. Reported on 05/03/2016, Disp: , Rfl:  .  hydrochlorothiazide (HYDRODIURIL) 25 MG tablet, , Disp: , Rfl:  .  losartan (COZAAR) 100 MG tablet, Take 1 tablet (100 mg total) by mouth daily., Disp: 90 tablet, Rfl: 3 .  magnesium oxide (MAG-OX) 400 (241.3 MG) MG  tablet, Take by mouth., Disp: , Rfl:  .  MULTIPLE VITAMIN PO, Take by mouth., Disp: , Rfl:  .  naproxen (NAPROSYN) 500 MG tablet, Take 1 tablet (500 mg total) by mouth 2 (two) times daily with a meal., Disp: 60 tablet, Rfl: 0 .  ONE TOUCH ULTRA TEST test strip, , Disp: , Rfl:  .  pravastatin (PRAVACHOL) 10 MG tablet, Take 1 tablet (10 mg total) by mouth daily., Disp: 90 tablet, Rfl: 3 .  insulin NPH Human (HUMULIN N,NOVOLIN N) 100 UNIT/ML injection, Inject into the skin., Disp: , Rfl:  .  insulin regular (NOVOLIN R,HUMULIN R) 250 units/2.27mL (100 units/mL) injection, Inject 15 units at breakfast, 15 units at lunch and 20 units at supper, Disp: , Rfl:  .  omeprazole (PRILOSEC) 20 MG capsule, Take 1 capsule (20 mg total) by mouth daily. (Patient not taking: Reported on 11/26/2016), Disp: 30 capsule, Rfl: 12 .  polyethylene glycol powder (GLYCOLAX/MIRALAX) powder, Take 17 g by mouth daily., Disp: 119 g, Rfl: 0 .  ranitidine (ZANTAC) 150 MG capsule, Take 1 capsule (150 mg total) by mouth 2 (two) times daily. (Patient not taking: Reported on 11/26/2016), Disp: 60 capsule, Rfl: 5  Review of Systems  Constitutional: Positive for appetite change (Does not have much of an appetite.). Negative for activity change, chills, diaphoresis, fatigue, fever and unexpected weight change.  HENT: Negative for hoarse voice.   Respiratory: Positive for cough. Negative for apnea,  choking, chest tightness, shortness of breath, wheezing and stridor.   Cardiovascular: Negative.   Gastrointestinal: Positive for abdominal pain, constipation and heartburn. Negative for abdominal distention, anal bleeding, blood in stool, diarrhea, nausea, rectal pain and vomiting.  Neurological: Negative for dizziness, light-headedness and headaches.    Social History  Substance Use Topics  . Smoking status: Never Smoker  . Smokeless tobacco: Not on file  . Alcohol use No   Objective:   BP 138/72 (BP Location: Left Arm, Patient Position:  Sitting, Cuff Size: Normal)   Pulse 64   Temp 98.5 F (36.9 C) (Oral)   Resp 16   Wt 197 lb (89.4 kg)   BMI 29.95 kg/m   Physical Exam  Constitutional: He is oriented to person, place, and time. He appears well-developed and well-nourished.  Cardiovascular: Normal rate and regular rhythm.   Pulmonary/Chest: Effort normal and breath sounds normal.  Abdominal: Soft. Bowel sounds are normal. He exhibits no distension, no abdominal bruit, no pulsatile midline mass and no mass. There is no tenderness. There is no rebound and no guarding.  Neurological: He is alert and oriented to person, place, and time.  Skin: Skin is warm and dry.  Psychiatric: He has a normal mood and affect. His behavior is normal.        Assessment & Plan:      Problem List Items Addressed This Visit      Digestive   Gastro-esophageal reflux disease without esophagitis - Primary   Relevant Medications   polyethylene glycol powder (GLYCOLAX/MIRALAX) powder    Other Visit Diagnoses    Constipation, unspecified constipation type       Relevant Medications   polyethylene glycol powder (GLYCOLAX/MIRALAX) powder     Patient is 80 y/o male presenting with heartburn. Has prescription for zantac, but has not taken, so instructed him to start taking 150 mg Zantac BID. Also counseled about putting brick/yoga block under head of bead to elevate while sleeping and hopefully reduce symptoms. For constipation, he may do one scoop of Miralax stirred into his coffee in the morning x 7 days. Has appt with Dr. Rosanna Randy on 12/06/2016 so he can discuss progress then.   Return if symptoms worsen or fail to improve.   Patient Instructions  Gastroesophageal Reflux Disease, Adult Introduction Normally, food travels down the esophagus and stays in the stomach to be digested. If a person has gastroesophageal reflux disease (GERD), food and stomach acid move back up into the esophagus. When this happens, the esophagus becomes sore and  swollen (inflamed). Over time, GERD can make small holes (ulcers) in the lining of the esophagus. Follow these instructions at home: Diet  Follow a diet as told by your doctor. You may need to avoid foods and drinks such as:  Coffee and tea (with or without caffeine).  Drinks that contain alcohol.  Energy drinks and sports drinks.  Carbonated drinks or sodas.  Chocolate and cocoa.  Peppermint and mint flavorings.  Garlic and onions.  Horseradish.  Spicy and acidic foods, such as peppers, chili powder, curry powder, vinegar, hot sauces, and BBQ sauce.  Citrus fruit juices and citrus fruits, such as oranges, lemons, and limes.  Tomato-based foods, such as red sauce, chili, salsa, and pizza with red sauce.  Fried and fatty foods, such as donuts, french fries, potato chips, and high-fat dressings.  High-fat meats, such as hot dogs, rib eye steak, sausage, ham, and bacon.  High-fat dairy items, such as whole milk, butter, and cream cheese.  Eat small meals often. Avoid eating large meals.  Avoid drinking large amounts of liquid with your meals.  Avoid eating meals during the 2-3 hours before bedtime.  Avoid lying down right after you eat.  Do not exercise right after you eat. General instructions  Pay attention to any changes in your symptoms.  Take over-the-counter and prescription medicines only as told by your doctor. Do not take aspirin, ibuprofen, or other NSAIDs unless your doctor says it is okay.  Do not use any tobacco products, including cigarettes, chewing tobacco, and e-cigarettes. If you need help quitting, ask your doctor.  Wear loose clothes. Do not wear anything tight around your waist.  Raise (elevate) the head of your bed about 6 inches (15 cm).  Try to lower your stress. If you need help doing this, ask your doctor.  If you are overweight, lose an amount of weight that is healthy for you. Ask your doctor about a safe weight loss goal.  Keep all  follow-up visits as told by your doctor. This is important. Contact a doctor if:  You have new symptoms.  You lose weight and you do not know why it is happening.  You have trouble swallowing, or it hurts to swallow.  You have wheezing or a cough that keeps happening.  Your symptoms do not get better with treatment.  You have a hoarse voice. Get help right away if:  You have pain in your arms, neck, jaw, teeth, or back.  You feel sweaty, dizzy, or light-headed.  You have chest pain or shortness of breath.  You throw up (vomit) and your throw up looks like blood or coffee grounds.  You pass out (faint).  Your poop (stool) is bloody or black.  You cannot swallow, drink, or eat. This information is not intended to replace advice given to you by your health care provider. Make sure you discuss any questions you have with your health care provider. Document Released: 05/24/2008 Document Revised: 05/13/2016 Document Reviewed: 04/02/2015  2017 Elsevier    The entirety of the information documented in the History of Present Illness, Review of Systems and Physical Exam were personally obtained by me. Portions of this information were initially documented by Ashley Royalty, CMA and reviewed by me for thoroughness and accuracy.         Trinna Post, PA-C  [facility]

## 2016-11-26 NOTE — Patient Instructions (Signed)
Gastroesophageal Reflux Disease, Adult Introduction Normally, food travels down the esophagus and stays in the stomach to be digested. If a person has gastroesophageal reflux disease (GERD), food and stomach acid move back up into the esophagus. When this happens, the esophagus becomes sore and swollen (inflamed). Over time, GERD can make small holes (ulcers) in the lining of the esophagus. Follow these instructions at home: Diet  Follow a diet as told by your doctor. You may need to avoid foods and drinks such as:  Coffee and tea (with or without caffeine).  Drinks that contain alcohol.  Energy drinks and sports drinks.  Carbonated drinks or sodas.  Chocolate and cocoa.  Peppermint and mint flavorings.  Garlic and onions.  Horseradish.  Spicy and acidic foods, such as peppers, chili powder, curry powder, vinegar, hot sauces, and BBQ sauce.  Citrus fruit juices and citrus fruits, such as oranges, lemons, and limes.  Tomato-based foods, such as red sauce, chili, salsa, and pizza with red sauce.  Fried and fatty foods, such as donuts, french fries, potato chips, and high-fat dressings.  High-fat meats, such as hot dogs, rib eye steak, sausage, ham, and bacon.  High-fat dairy items, such as whole milk, butter, and cream cheese.  Eat small meals often. Avoid eating large meals.  Avoid drinking large amounts of liquid with your meals.  Avoid eating meals during the 2-3 hours before bedtime.  Avoid lying down right after you eat.  Do not exercise right after you eat. General instructions  Pay attention to any changes in your symptoms.  Take over-the-counter and prescription medicines only as told by your doctor. Do not take aspirin, ibuprofen, or other NSAIDs unless your doctor says it is okay.  Do not use any tobacco products, including cigarettes, chewing tobacco, and e-cigarettes. If you need help quitting, ask your doctor.  Wear loose clothes. Do not wear anything  tight around your waist.  Raise (elevate) the head of your bed about 6 inches (15 cm).  Try to lower your stress. If you need help doing this, ask your doctor.  If you are overweight, lose an amount of weight that is healthy for you. Ask your doctor about a safe weight loss goal.  Keep all follow-up visits as told by your doctor. This is important. Contact a doctor if:  You have new symptoms.  You lose weight and you do not know why it is happening.  You have trouble swallowing, or it hurts to swallow.  You have wheezing or a cough that keeps happening.  Your symptoms do not get better with treatment.  You have a hoarse voice. Get help right away if:  You have pain in your arms, neck, jaw, teeth, or back.  You feel sweaty, dizzy, or light-headed.  You have chest pain or shortness of breath.  You throw up (vomit) and your throw up looks like blood or coffee grounds.  You pass out (faint).  Your poop (stool) is bloody or black.  You cannot swallow, drink, or eat. This information is not intended to replace advice given to you by your health care provider. Make sure you discuss any questions you have with your health care provider. Document Released: 05/24/2008 Document Revised: 05/13/2016 Document Reviewed: 04/02/2015  2017 Elsevier

## 2016-11-29 DIAGNOSIS — N183 Chronic kidney disease, stage 3 (moderate): Secondary | ICD-10-CM | POA: Diagnosis not present

## 2016-11-29 DIAGNOSIS — R69 Illness, unspecified: Secondary | ICD-10-CM | POA: Diagnosis not present

## 2016-11-29 DIAGNOSIS — N182 Chronic kidney disease, stage 2 (mild): Secondary | ICD-10-CM | POA: Diagnosis not present

## 2016-12-06 ENCOUNTER — Ambulatory Visit (INDEPENDENT_AMBULATORY_CARE_PROVIDER_SITE_OTHER): Payer: Medicare HMO | Admitting: Family Medicine

## 2016-12-06 VITALS — BP 144/58 | HR 72 | Temp 98.7°F | Resp 16 | Wt 200.0 lb

## 2016-12-06 DIAGNOSIS — E1140 Type 2 diabetes mellitus with diabetic neuropathy, unspecified: Secondary | ICD-10-CM | POA: Diagnosis not present

## 2016-12-06 DIAGNOSIS — E118 Type 2 diabetes mellitus with unspecified complications: Secondary | ICD-10-CM

## 2016-12-06 DIAGNOSIS — K219 Gastro-esophageal reflux disease without esophagitis: Secondary | ICD-10-CM | POA: Diagnosis not present

## 2016-12-06 DIAGNOSIS — Z794 Long term (current) use of insulin: Secondary | ICD-10-CM | POA: Diagnosis not present

## 2016-12-06 DIAGNOSIS — I10 Essential (primary) hypertension: Secondary | ICD-10-CM

## 2016-12-06 DIAGNOSIS — E7800 Pure hypercholesterolemia, unspecified: Secondary | ICD-10-CM

## 2016-12-06 NOTE — Progress Notes (Signed)
Stephen White  MRN: [id number] DOB: July 06, 1936  Subjective:  HPI  Patient is here for 1 month follow up. GERD: patient was advised to stop Prilosec due to possible side effects and was told to take Probiotic for flatulence. Heartburn got worse been off Prilosec and patient then advised to take Zantac and heartburn got better but not as good controlled as when he was on Omeprazole. He also developed constipation since been off Prilosec and came to see Adriana on 11/26/16 for constipation and advised to try Glycolax which he did, issue did not improve. He then went and got Magnesium Citrate and symptoms improved after using it. He usually has 1 to 3 bowels a day and now he is lucky to have 1 bowel a day maybe. He does have some abdominal pain in the upper middle abdomen area in the evenings he has noticed. Patient Active Problem List   Diagnosis Date Noted  . Flatulence 11/03/2016  . Multiple actinic keratoses 05/05/2015  . Arthropathia 05/05/2015  . CAFL (chronic airflow limitation) ([place]) 05/05/2015  . 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 Gastro-esophageal reflux disease without esophagitis 05/05/2015  . Retinopathy 12/23/2014  . Long term current use of insulin ([place]) 12/23/2014  . Microalbuminuria 12/23/2014  . Type 2 diabetes mellitus treated with insulin ([place]) 05/29/2010  . Chronic kidney disease 05/07/2010  . Allergic rhinitis 05/14/2008  . Essential (primary) hypertension 10/18/2007  . HLD (hyperlipidemia) 09/06/2007    Past Medical History:  Diagnosis Date  . Chronic kidney disease   . Diabetes mellitus without complication ([place])   . GERD (gastroesophageal reflux disease)   . Hyperlipidemia   . Hypertension   . Thyroid disease     Social History   Social History  . Marital status: Married    Spouse name: N/A  . Number of children: N/A  . Years of education: N/A   Occupational History  . Not on file.   Social History Main Topics  . Smoking status: Never  Smoker  . Smokeless tobacco: Not on file  . Alcohol use No  . Drug use: No  . Sexual activity: Not on file   Other Topics Concern  . Not on file   Social History Narrative  . No narrative on file    Outpatient Encounter Prescriptions as of 12/06/2016  Medication Sig Note  . amLODipine (NORVASC) 5 MG tablet  10/03/2015: Received from: External Pharmacy  . aspirin 81 MG tablet Take by mouth. 05/05/2015: Received from: [facility]  . fluticasone (FLONASE) 50 MCG/ACT nasal spray Place into the nose. Reported on 05/03/2016 05/05/2015: Received from: [facility]  . hydrochlorothiazide (HYDRODIURIL) 25 MG tablet  10/03/2015: Received from: External Pharmacy  . insulin NPH Human (HUMULIN N,NOVOLIN N) 100 UNIT/ML injection Inject into the skin. 11/03/2016: Received from: [place]: Inject subcutaneously. Inject 15 units every morning and 22 units every evening  . insulin regular (NOVOLIN R,HUMULIN R) 250 units/2.108mL (100 units/mL) injection Inject 15 units at breakfast, 15 units at lunch and 20 units at supper 11/03/2016: Received from: [facility]  . losartan (COZAAR) 100 MG tablet Take 1 tablet (100 mg total) by mouth daily.   . magnesium oxide (MAG-OX) 400 (241.3 MG) MG tablet Take by mouth. 05/05/2015: Received from: [facility]  . MULTIPLE VITAMIN PO Take by mouth. 05/05/2015: Received from: [facility]  . naproxen (NAPROSYN) 500 MG tablet Take 1 tablet (500 mg total) by mouth 2 (  two) times daily with a meal.   . ONE TOUCH ULTRA TEST test strip  10/03/2015: Received from: External Pharmacy  . pravastatin (PRAVACHOL) 10 MG tablet Take 1 tablet (10 mg total) by mouth daily.   . ranitidine (ZANTAC) 150 MG capsule Take 1 capsule (150 mg total) by mouth 2 (two) times daily.   Marland Kitchen omeprazole (PRILOSEC) 20 MG capsule Take 1 capsule (20 mg total) by mouth daily. (Patient not taking:  Reported on 12/06/2016)    No facility-administered encounter medications on file as of 12/06/2016.     No Known Allergies  Review of Systems  Constitutional: Negative.   HENT: Positive for hearing loss (chronic).   Eyes: Negative.   Respiratory: Negative.   Cardiovascular: Negative.   Gastrointestinal: Positive for abdominal pain, constipation and heartburn.       Gas, better  Musculoskeletal: Positive for joint pain.  Neurological: Negative.   Endo/Heme/Allergies: Negative.   Psychiatric/Behavioral: Negative.     Objective:  BP (!) 144/58   Pulse 72   Temp 98.7 F (37.1 C)   Resp 16   Wt 200 lb (90.7 kg)   BMI 30.41 kg/m   Physical Exam  Constitutional: He is oriented to person, place, and time and well-developed, well-nourished, and in no distress.  HENT:  Head: Normocephalic and atraumatic.  Eyes: Conjunctivae are normal. Pupils are equal, round, and reactive to light.  Neck: Normal range of motion. Neck supple.  Cardiovascular: Normal rate, regular rhythm, normal heart sounds and intact distal pulses.   No murmur heard. Pulmonary/Chest: Effort normal and breath sounds normal. No respiratory distress. He has no wheezes.  Musculoskeletal: He exhibits no edema or tenderness.  Neurological: He is alert and oriented to person, place, and time.    Assessment and Plan :  1. Gastro-esophageal reflux disease without esophagitis Symptoms are stable at this time on Zantac. Patient seems to have some abdominal pain off and on Zantac but on Prilosec he had gas. Patient will continue Zantac at this time. 2. Controlled type 2 diabetes mellitus with diabetic neuropathy, with long-term current use of insulin ([place]) Followed by endocrinologist.   3. Essential (primary) hypertension Stable.  4. Pure hypercholesterolemia Stable.  5.Hypothyroidism Resolved.  HPI, Exam and A&P transcribed under direction and in the presence of Miguel Aschoff, MD. I have done the exam and  reviewed the chart and it is accurate to the best of my knowledge. Development worker, community has been used and  any errors in dictation or transcription are unintentional. Miguel Aschoff M.D. [facility]

## 2016-12-16 ENCOUNTER — Ambulatory Visit (INDEPENDENT_AMBULATORY_CARE_PROVIDER_SITE_OTHER): Payer: Medicare HMO | Admitting: Physician Assistant

## 2016-12-16 ENCOUNTER — Encounter: Payer: Self-pay | Admitting: Physician Assistant

## 2016-12-16 VITALS — BP 124/66 | HR 63 | Temp 98.1°F | Resp 16 | Wt 199.4 lb

## 2016-12-16 DIAGNOSIS — K5900 Constipation, unspecified: Secondary | ICD-10-CM

## 2016-12-16 NOTE — Patient Instructions (Signed)
METAMUCIL 1 tsp per day in a cup of water/drink of choice. Drink lots of fluids.   Constipation, Adult Constipation is when a person:  Poops (has a bowel movement) fewer times in a week than normal.  Has a hard time pooping.  Has poop that is dry, hard, or bigger than normal. Follow these instructions at home: Eating and drinking  Eat foods that have a lot of fiber, such as:  Fresh fruits and vegetables.  Whole grains.  Beans.  Eat less of foods that are high in fat, low in fiber, or overly processed, such as:  [place] fries.  Hamburgers.  Cookies.  Candy.  Soda.  Drink enough fluid to keep your pee (urine) clear or pale yellow. General instructions  Exercise regularly or as told by your doctor.  Go to the restroom when you feel like you need to poop. Do not hold it in.  Take over-the-counter and prescription medicines only as told by your doctor. These include any fiber supplements.  Do pelvic floor retraining exercises, such as:  Doing deep breathing while relaxing your lower belly (abdomen).  Relaxing your pelvic floor while pooping.  Watch your condition for any changes.  Keep all follow-up visits as told by your doctor. This is important. Contact a doctor if:  You have pain that gets worse.  You have a fever.  You have not pooped for 4 days.  You throw up (vomit).  You are not hungry.  You lose weight.  You are bleeding from the anus.  You have thin, pencil-like poop (stool). Get help right away if:  You have a fever, and your symptoms suddenly get worse.  You leak poop or have blood in your poop.  Your belly feels hard or bigger than normal (is bloated).  You have very bad belly pain.  You feel dizzy or you faint. This information is not intended to replace advice given to you by your health care provider. Make sure you discuss any questions you have with your health care provider. Document Released: 05/24/2008 Document Revised:  06/25/2016 Document Reviewed: 05/26/2016 Elsevier Interactive Patient Education  2017 [employer].

## 2016-12-16 NOTE — Progress Notes (Signed)
Patient: Stephen White Male    DOB: 1936/06/22   80 y.o.   MRN: [id number] Visit Date: 12/16/2016  Today's Provider: Trinna Post, PA-C   Chief Complaint  Patient presents with  . Abdominal Pain   Subjective:    Abdominal Pain  This is a new problem. Episode onset: 1 week ago. The problem has been resolved. The pain is located in the epigastric region and LUQ. The quality of the pain is dull and aching. Associated symptoms include constipation. The pain is relieved by bowel movements. Treatments tried: milk of mag and miralax. The treatment provided significant relief.   Patient reports he has been constipated on and off for a month. Had bowel movement today and the pain significantly decreased. Denies radiation of pain to his back. Denies melena or hematochezia. Denies obstipation. Denies urinary problems. No nausea and no vomiting. Milk of magnesia provided short term relief, though he was told he could not be on this long term. Miralax did not work. He describes his diet as "eating whatever he wants." He gets minimal exercise.    Previous Medications   AMLODIPINE (NORVASC) 5 MG TABLET       ASPIRIN 81 MG TABLET    Take by mouth.   FLUTICASONE (FLONASE) 50 MCG/ACT NASAL SPRAY    Place into the nose. Reported on 05/03/2016   HYDROCHLOROTHIAZIDE (HYDRODIURIL) 25 MG TABLET       INSULIN NPH HUMAN (HUMULIN N,NOVOLIN N) 100 UNIT/ML INJECTION    Inject into the skin.   INSULIN REGULAR (NOVOLIN R,HUMULIN R) 250 UNITS/2.5ML (100 UNITS/ML) INJECTION    Inject 15 units at breakfast, 15 units at lunch and 20 units at supper   LOSARTAN (COZAAR) 100 MG TABLET    Take 1 tablet (100 mg total) by mouth daily.   MAGNESIUM OXIDE (MAG-OX) 400 (241.3 MG) MG TABLET    Take by mouth.   MULTIPLE VITAMIN PO    Take by mouth.   ONE TOUCH ULTRA TEST TEST STRIP       PRAVASTATIN (PRAVACHOL) 10 MG TABLET    Take 1 tablet (10 mg total) by mouth daily.   RANITIDINE (ZANTAC) 150 MG CAPSULE    Take 1 capsule  (150 mg total) by mouth 2 (two) times daily.    Review of Systems  Constitutional: Negative.   Respiratory: Negative.   Cardiovascular: Negative.   Gastrointestinal: Positive for abdominal pain and constipation.    Social History  Substance Use Topics  . Smoking status: Never Smoker  . Smokeless tobacco: Not on file  . Alcohol use No   Objective:   BP 124/66 (BP Location: Right Arm, Patient Position: Sitting, Cuff Size: Normal)   Pulse 63   Temp 98.1 F (36.7 C) (Oral)   Resp 16   Wt 199 lb 6.4 oz (90.4 kg)   BMI 30.32 kg/m   Physical Exam  Constitutional: He is oriented to person, place, and time. He appears well-developed and well-nourished.  Abdominal: Soft. Bowel sounds are normal. He exhibits no distension and no mass. There is no hepatosplenomegaly. There is no tenderness. There is no rebound, no guarding and no CVA tenderness.  Neurological: He is alert and oriented to person, place, and time.  Skin: Skin is warm and dry.  Psychiatric: He has a normal mood and affect. His behavior is normal.        Assessment & Plan:     1. Constipation, unspecified constipation type  Patient presenting with concern for constipation.  Have seen him in clinic nearly a month ago and he was constipated then as well. No alarm symptoms. Have counseled him to try a teaspoon of metamucil stirred into his morning drink, increase fluid intake, and increase moderate walking to promote digestion.   Return if symptoms worsen or fail to improve.   Patient Instructions  METAMUCIL 1 tsp per day in a cup of water/drink of choice. Drink lots of fluids.   Constipation, Adult Constipation is when a person:  Poops (has a bowel movement) fewer times in a week than normal.  Has a hard time pooping.  Has poop that is dry, hard, or bigger than normal. Follow these instructions at home: Eating and drinking  Eat foods that have a lot of fiber, such as:  Fresh fruits and vegetables.  Whole  grains.  Beans.  Eat less of foods that are high in fat, low in fiber, or overly processed, such as:  [place] fries.  Hamburgers.  Cookies.  Candy.  Soda.  Drink enough fluid to keep your pee (urine) clear or pale yellow. General instructions  Exercise regularly or as told by your doctor.  Go to the restroom when you feel like you need to poop. Do not hold it in.  Take over-the-counter and prescription medicines only as told by your doctor. These include any fiber supplements.  Do pelvic floor retraining exercises, such as:  Doing deep breathing while relaxing your lower belly (abdomen).  Relaxing your pelvic floor while pooping.  Watch your condition for any changes.  Keep all follow-up visits as told by your doctor. This is important. Contact a doctor if:  You have pain that gets worse.  You have a fever.  You have not pooped for 4 days.  You throw up (vomit).  You are not hungry.  You lose weight.  You are bleeding from the anus.  You have thin, pencil-like poop (stool). Get help right away if:  You have a fever, and your symptoms suddenly get worse.  You leak poop or have blood in your poop.  Your belly feels hard or bigger than normal (is bloated).  You have very bad belly pain.  You feel dizzy or you faint. This information is not intended to replace advice given to you by your health care provider. Make sure you discuss any questions you have with your health care provider. Document Released: 05/24/2008 Document Revised: 06/25/2016 Document Reviewed: 05/26/2016 Elsevier Interactive Patient Education  2017 [employer].     The entirety of the information documented in the History of Present Illness, Review of Systems and Physical Exam were personally obtained by me. Portions of this information were initially documented by Tiburcio Pea and reviewed by me for thoroughness and accuracy.         Follow up: No Follow-up on file.

## 2017-01-06 DIAGNOSIS — R69 Illness, unspecified: Secondary | ICD-10-CM | POA: Diagnosis not present

## 2017-01-19 DIAGNOSIS — E113313 Type 2 diabetes mellitus with moderate nonproliferative diabetic retinopathy with macular edema, bilateral: Secondary | ICD-10-CM | POA: Diagnosis not present

## 2017-02-11 DIAGNOSIS — R69 Illness, unspecified: Secondary | ICD-10-CM | POA: Diagnosis not present

## 2017-02-21 DIAGNOSIS — Z794 Long term (current) use of insulin: Secondary | ICD-10-CM | POA: Diagnosis not present

## 2017-02-21 DIAGNOSIS — E113393 Type 2 diabetes mellitus with moderate nonproliferative diabetic retinopathy without macular edema, bilateral: Secondary | ICD-10-CM | POA: Diagnosis not present

## 2017-03-01 DIAGNOSIS — N183 Chronic kidney disease, stage 3 (moderate): Secondary | ICD-10-CM | POA: Diagnosis not present

## 2017-03-01 DIAGNOSIS — Z794 Long term (current) use of insulin: Secondary | ICD-10-CM | POA: Diagnosis not present

## 2017-03-01 DIAGNOSIS — E1121 Type 2 diabetes mellitus with diabetic nephropathy: Secondary | ICD-10-CM | POA: Diagnosis not present

## 2017-03-01 DIAGNOSIS — E1122 Type 2 diabetes mellitus with diabetic chronic kidney disease: Secondary | ICD-10-CM | POA: Diagnosis not present

## 2017-03-01 DIAGNOSIS — E113393 Type 2 diabetes mellitus with moderate nonproliferative diabetic retinopathy without macular edema, bilateral: Secondary | ICD-10-CM | POA: Diagnosis not present

## 2017-03-01 DIAGNOSIS — E1129 Type 2 diabetes mellitus with other diabetic kidney complication: Secondary | ICD-10-CM | POA: Diagnosis not present

## 2017-03-01 DIAGNOSIS — R809 Proteinuria, unspecified: Secondary | ICD-10-CM | POA: Diagnosis not present

## 2017-03-16 DIAGNOSIS — K219 Gastro-esophageal reflux disease without esophagitis: Secondary | ICD-10-CM | POA: Diagnosis not present

## 2017-03-16 DIAGNOSIS — Z794 Long term (current) use of insulin: Secondary | ICD-10-CM | POA: Diagnosis not present

## 2017-03-16 DIAGNOSIS — Z0000 Encounter for general adult medical examination without abnormal findings: Secondary | ICD-10-CM | POA: Diagnosis not present

## 2017-03-16 DIAGNOSIS — Z7982 Long term (current) use of aspirin: Secondary | ICD-10-CM | POA: Diagnosis not present

## 2017-03-16 DIAGNOSIS — Z972 Presence of dental prosthetic device (complete) (partial): Secondary | ICD-10-CM | POA: Diagnosis not present

## 2017-03-16 DIAGNOSIS — I10 Essential (primary) hypertension: Secondary | ICD-10-CM | POA: Diagnosis not present

## 2017-03-16 DIAGNOSIS — Z9849 Cataract extraction status, unspecified eye: Secondary | ICD-10-CM | POA: Diagnosis not present

## 2017-03-16 DIAGNOSIS — E119 Type 2 diabetes mellitus without complications: Secondary | ICD-10-CM | POA: Diagnosis not present

## 2017-03-16 DIAGNOSIS — Z6830 Body mass index (BMI) 30.0-30.9, adult: Secondary | ICD-10-CM | POA: Diagnosis not present

## 2017-03-16 DIAGNOSIS — E669 Obesity, unspecified: Secondary | ICD-10-CM | POA: Diagnosis not present

## 2017-03-16 DIAGNOSIS — H9113 Presbycusis, bilateral: Secondary | ICD-10-CM | POA: Diagnosis not present

## 2017-03-16 DIAGNOSIS — Z974 Presence of external hearing-aid: Secondary | ICD-10-CM | POA: Diagnosis not present

## 2017-03-16 DIAGNOSIS — R252 Cramp and spasm: Secondary | ICD-10-CM | POA: Diagnosis not present

## 2017-03-17 DIAGNOSIS — L578 Other skin changes due to chronic exposure to nonionizing radiation: Secondary | ICD-10-CM | POA: Diagnosis not present

## 2017-03-17 DIAGNOSIS — L812 Freckles: Secondary | ICD-10-CM | POA: Diagnosis not present

## 2017-03-17 DIAGNOSIS — L821 Other seborrheic keratosis: Secondary | ICD-10-CM | POA: Diagnosis not present

## 2017-03-17 DIAGNOSIS — L570 Actinic keratosis: Secondary | ICD-10-CM | POA: Diagnosis not present

## 2017-03-24 DIAGNOSIS — R69 Illness, unspecified: Secondary | ICD-10-CM | POA: Diagnosis not present

## 2017-04-29 DIAGNOSIS — R69 Illness, unspecified: Secondary | ICD-10-CM | POA: Diagnosis not present

## 2017-06-06 ENCOUNTER — Ambulatory Visit: Payer: Medicare HMO | Admitting: Family Medicine

## 2017-06-07 ENCOUNTER — Ambulatory Visit (INDEPENDENT_AMBULATORY_CARE_PROVIDER_SITE_OTHER): Payer: Medicare HMO | Admitting: Family Medicine

## 2017-06-07 ENCOUNTER — Encounter: Payer: Self-pay | Admitting: Family Medicine

## 2017-06-07 ENCOUNTER — Ambulatory Visit (INDEPENDENT_AMBULATORY_CARE_PROVIDER_SITE_OTHER): Payer: Medicare HMO

## 2017-06-07 VITALS — BP 132/56 | HR 72 | Temp 98.6°F | Ht 68.0 in | Wt 194.8 lb

## 2017-06-07 VITALS — BP 132/56 | HR 72 | Temp 98.6°F | Wt 194.0 lb

## 2017-06-07 DIAGNOSIS — E1140 Type 2 diabetes mellitus with diabetic neuropathy, unspecified: Secondary | ICD-10-CM | POA: Diagnosis not present

## 2017-06-07 DIAGNOSIS — R079 Chest pain, unspecified: Secondary | ICD-10-CM

## 2017-06-07 DIAGNOSIS — I10 Essential (primary) hypertension: Secondary | ICD-10-CM | POA: Diagnosis not present

## 2017-06-07 DIAGNOSIS — Z0000 Encounter for general adult medical examination without abnormal findings: Secondary | ICD-10-CM

## 2017-06-07 DIAGNOSIS — R69 Illness, unspecified: Secondary | ICD-10-CM | POA: Diagnosis not present

## 2017-06-07 DIAGNOSIS — Z794 Long term (current) use of insulin: Secondary | ICD-10-CM | POA: Diagnosis not present

## 2017-06-07 DIAGNOSIS — K219 Gastro-esophageal reflux disease without esophagitis: Secondary | ICD-10-CM

## 2017-06-07 DIAGNOSIS — E7800 Pure hypercholesterolemia, unspecified: Secondary | ICD-10-CM | POA: Diagnosis not present

## 2017-06-07 NOTE — Progress Notes (Signed)
Subjective:   Stephen White is a 81 y.o. male who presents for Medicare Annual/Subsequent preventive examination.  Review of Systems:  N/A  Cardiac Risk Factors include: advanced age (>63men, >63 women);diabetes mellitus;dyslipidemia;hypertension;male gender     Objective:    Vitals: BP (!) 132/56 (BP Location: Right Arm)   Pulse 72   Temp 98.6 F (37 C) (Oral)   Ht 5\' 8"  (1.727 m)   Wt 194 lb 12.8 oz (88.4 kg)   BMI 29.62 kg/m   Body mass index is 29.62 kg/m.  Tobacco History  Smoking Status  . Never Smoker  Smokeless Tobacco  . Never Used     Counseling given: Not Answered   Past Medical History:  Diagnosis Date  . Chronic kidney disease   . Diabetes mellitus without complication ([place])   . GERD (gastroesophageal reflux disease)   . Hyperlipidemia   . Hypertension   . Thyroid disease    Past Surgical History:  Procedure Laterality Date  . EYE SURGERY     Family History  Problem Relation Age of Onset  . Diabetes Mother   . Diabetes Father   . Diabetes Brother   . Diabetes Sister   . Diabetes Sister   . Diabetes Brother   . Diabetes Brother   . Diabetes Brother   . Cancer Brother   . Diabetes Brother    History  Sexual Activity  . Sexual activity: Not on file    Outpatient Encounter Prescriptions as of 06/07/2017  Medication Sig  . amLODipine (NORVASC) 5 MG tablet Take 5 mg by mouth daily.   Marland Kitchen aspirin 81 MG tablet Take by mouth.  . hydrochlorothiazide (HYDRODIURIL) 25 MG tablet Take 25 mg by mouth daily.   . insulin NPH Human (HUMULIN N,NOVOLIN N) 100 UNIT/ML injection Inject 15 Units into the skin daily before breakfast.   . insulin regular (NOVOLIN R,HUMULIN R) 250 units/2.38mL (100 units/mL) injection Inject 15 units at breakfast, 15 units at lunch and 20 units at supper  . losartan (COZAAR) 100 MG tablet Take 1 tablet (100 mg total) by mouth daily.  . MULTIPLE VITAMIN PO Take by mouth.  Marland Kitchen omeprazole (PRILOSEC) 20 MG capsule Take 20 mg by  mouth daily.  . ONE TOUCH ULTRA TEST test strip   . magnesium oxide (MAG-OX) 400 (241.3 MG) MG tablet Take by mouth.  . [DISCONTINUED] fluticasone (FLONASE) 50 MCG/ACT nasal spray Place into the nose. Reported on 05/03/2016  . [DISCONTINUED] pravastatin (PRAVACHOL) 10 MG tablet Take 1 tablet (10 mg total) by mouth daily.  . [DISCONTINUED] ranitidine (ZANTAC) 150 MG capsule Take 1 capsule (150 mg total) by mouth 2 (two) times daily.   No facility-administered encounter medications on file as of 06/07/2017.     Activities of Daily Living In your present state of health, do you have any difficulty performing the following activities: 06/07/2017  Hearing? Y  Vision? N  Difficulty concentrating or making decisions? N  Walking or climbing stairs? N  Dressing or bathing? N  Doing errands, shopping? N  Preparing Food and eating ? N  Using the Toilet? N  In the past six months, have you accidently leaked urine? Y  Do you have problems with loss of bowel control? N  Managing your Medications? N  Managing your Finances? N  Housekeeping or managing your Housekeeping? N  Some recent data might be hidden    Patient Care Team: Jerrol Banana., MD as PCP - General (Family Medicine) Benay Pillow  Elta Guadeloupe, MD as Consulting Physician (Ophthalmology) Samara Deist, DPM as Referring Physician (Podiatry) Solum, Betsey Holiday, MD as Physician Assistant (Endocrinology) Ralene Bathe, MD as Consulting Physician (Dermatology)   Assessment:     Exercise Activities and Dietary recommendations Current Exercise Habits: The patient does not participate in regular exercise at present (pt states he does a lot for his wife), Exercise limited by: None identified  Goals    . Increase water intake          Recommend increasing water intake to 4-6 glasses of water a day.       Fall Risk Fall Risk  06/07/2017 12/06/2016 07/02/2015  Falls in the past year? No No No   Depression Screen PHQ 2/9 Scores  06/07/2017 12/06/2016 07/02/2015  PHQ - 2 Score 0 0 0    Cognitive Function     6CIT Screen 06/07/2017  What Year? 0 points  What month? 0 points  What time? 0 points  Count back from 20 0 points  Months in reverse 4 points  Repeat phrase 0 points  Total Score 4    Immunization History  Administered Date(s) Administered  . Influenza, High Dose Seasonal PF 10/03/2015, 11/03/2016  . Pneumococcal Conjugate-13 12/24/2014  . Pneumococcal Polysaccharide-23 12/25/2012  . Td 07/02/2015  . Zoster 12/25/2012   Screening Tests Health Maintenance  Topic Date Due  . HEMOGLOBIN A1C  05/03/2017  . INFLUENZA VACCINE  07/20/2017  . OPHTHALMOLOGY EXAM  12/20/2017  . FOOT EXAM  03/01/2018  . TETANUS/TDAP  07/01/2025  . PNA vac Low Risk Adult  Completed      Plan:  I have personally reviewed and addressed the Medicare Annual Wellness questionnaire and have noted the following in the patient's chart:  A. Medical and social history B. Use of alcohol, tobacco or illicit drugs  C. Current medications and supplements D. Functional ability and status E.  Nutritional status F.  Physical activity G. Advance directives H. List of other physicians I.  Hospitalizations, surgeries, and ER visits in previous 12 months J.  [place] such as hearing and vision if needed, cognitive and depression L. Referrals and appointments - none  In addition, I have reviewed and discussed with patient certain preventive protocols, quality metrics, and best practice recommendations. A written personalized care plan for preventive services as well as general preventive health recommendations were provided to patient.  See attached scanned questionnaire for additional information.   Signed,  Fabio Neighbors, LPN Nurse Health Advisor   MD Recommendations: Pt needs Hgb A1c checked today.

## 2017-06-07 NOTE — Progress Notes (Signed)
Subjective:  HPI  Hypertension, follow-up:  BP Readings from Last 3 Encounters:  06/07/17 (!) 132/56  06/07/17 (!) 132/56  12/16/16 124/66    He was last seen for hypertension 6 months ago.  BP at that visit was 124/66. Management since that visit includes none. He reports good compliance with treatment. He is not having side effects.  He is exercising. He is adherent to low salt diet.   Outside blood pressures are not being checked at home. He is experiencing chest pain. This has been occurring for a year and no other symptoms with it and only last for a couple of seconds. Patient denies chest pressure/discomfort, claudication, dyspnea, exertional chest pressure/discomfort, fatigue, irregular heart beat, lower extremity edema, near-syncope, orthopnea, palpitations, paroxysmal nocturnal dyspnea, syncope and tachypnea.   Cardiovascular risk factors include advanced age (older than 4 for men, 44 for women), diabetes mellitus, dyslipidemia, hypertension and male gender.  Wt Readings from Last 3 Encounters:  06/07/17 194 lb (88 kg)  06/07/17 194 lb 12.8 oz (88.4 kg)  12/16/16 199 lb 6.4 oz (90.4 kg)    ------------------------------------------------------------------------    Prior to Admission medications   Medication Sig Start Date End Date Taking? Authorizing Provider  amLODipine (NORVASC) 5 MG tablet Take 5 mg by mouth daily.  09/10/15  Yes [provider]  aspirin 81 MG tablet Take by mouth. 02/03/11  Yes [provider]  hydrochlorothiazide (HYDRODIURIL) 25 MG tablet Take 25 mg by mouth daily.  09/10/15  Yes [provider]  insulin NPH Human (HUMULIN N,NOVOLIN N) 100 UNIT/ML injection Inject 15 Units into the skin daily before breakfast.    Yes [provider]  insulin regular (NOVOLIN R,HUMULIN R) 250 units/2.63mL (100 units/mL) injection Inject 15 units at breakfast, 15 units at lunch and 20 units at supper 03/09/16  Yes [provider]  losartan (COZAAR) 100 MG tablet Take 1 tablet (100 mg total) by mouth daily. 09/01/16  Yes Jerrol Banana., MD  magnesium oxide (MAG-OX) 400 (241.3 MG) MG tablet Take by mouth. 12/24/14  Yes [provider]  MULTIPLE VITAMIN PO Take by mouth. 02/03/11  Yes [provider]  omeprazole (PRILOSEC) 20 MG capsule Take 20 mg by mouth daily. 09/12/14  Yes [provider]  ONE TOUCH ULTRA TEST test strip  09/13/15  Yes [provider]    Patient Active Problem List   Diagnosis Date Noted  . Flatulence 11/03/2016  . Multiple actinic keratoses 05/05/2015  . Arthropathia 05/05/2015  . CAFL (chronic airflow limitation) ([place]) 05/05/2015  . 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 Gastro-esophageal reflux disease without esophagitis 05/05/2015  . Retinopathy 12/23/2014  . Long term current use of insulin ([place]) 12/23/2014  . Microalbuminuria 12/23/2014  . Type 2 diabetes mellitus treated with insulin ([place]) 05/29/2010  . Chronic kidney disease 05/07/2010  . Allergic rhinitis 05/14/2008  . Essential (primary) hypertension 10/18/2007  . HLD (hyperlipidemia) 09/06/2007    Past Medical History:  Diagnosis Date  . Chronic kidney disease   . Diabetes mellitus without complication ([place])   . GERD (gastroesophageal reflux disease)   . Hyperlipidemia   . Hypertension   . Thyroid disease     Social History   Social History  . Marital status: Married    Spouse name: N/A  . Number of children: N/A  . Years of education: N/A   Occupational History  . Not on file.   Social History Main Topics  . Smoking status:  Never Smoker  . Smokeless tobacco: Never Used  . Alcohol use No  . Drug use: No  . Sexual activity: Not on file   Other Topics Concern  . Not on file   Social History Narrative  . No narrative on file    No Known Allergies  Review of Systems  Constitutional: Negative.   HENT: Negative.   Eyes: Negative.   Respiratory:  Negative.   Cardiovascular: Positive for chest pain (x 1 year).       Nonexertional CP. Lasts seconds/minutes. No associated sympptoms.  Gastrointestinal: Negative.   Genitourinary: Negative.   Musculoskeletal: Negative.   Skin: Negative.   Neurological: Negative.   Endo/Heme/Allergies: Negative.   Psychiatric/Behavioral: Negative.     Immunization History  Administered Date(s) Administered  . Influenza, High Dose Seasonal PF 10/03/2015, 11/03/2016  . Pneumococcal Conjugate-13 12/24/2014  . Pneumococcal Polysaccharide-23 12/25/2012  . Td 07/02/2015  . Zoster 12/25/2012    Objective:  BP (!) 132/56   Pulse 72   Temp 98.6 F (37 C)   Wt 194 lb (88 kg)   BMI 29.50 kg/m   Physical Exam  Constitutional: He is oriented to person, place, and time and well-developed, well-nourished, and in no distress.  Eyes: Conjunctivae and EOM are normal. Pupils are equal, round, and reactive to light.  Neck: Normal range of motion. Neck supple.  Cardiovascular: Normal rate, regular rhythm, normal heart sounds and intact distal pulses.   Pulmonary/Chest: Effort normal and breath sounds normal.  Musculoskeletal: Normal range of motion.  Neurological: He is alert and oriented to person, place, and time. He has normal reflexes. Gait normal. GCS score is 15.  Skin: Skin is warm and dry.  Early squamous cell on right side of face   Psychiatric: Mood, memory, affect and judgment normal.    Lab Results  Component Value Date   WBC 8.1 11/04/2016   HGB 14.4 11/04/2016   HCT 41.9 11/04/2016   PLT 309 04/29/2010   GLUCOSE 141 (H) 11/04/2016   CHOL 157 11/04/2016   TRIG 93 11/04/2016   HDL 49 11/04/2016   LDLCALC 89 11/04/2016   TSH 3.350 11/04/2016   PSA 1.0 06/11/2014   HGBA1C 7.1 11/03/2016    CMP     Component Value Date/Time   NA 142 11/04/2016 0853   K 4.7 11/04/2016 0853   CL 102 11/04/2016 0853   CO2 26 11/04/2016 0853   GLUCOSE 141 (H) 11/04/2016 0853   BUN 36 (H) 11/04/2016  0853   CREATININE 2.18 (H) 11/04/2016 0853   CALCIUM 9.3 11/04/2016 0853   PROT 6.6 11/04/2016 0853   ALBUMIN 4.0 11/04/2016 0853   AST 17 11/04/2016 0853   ALT 16 11/04/2016 0853   ALKPHOS 63 11/04/2016 0853   BILITOT 0.3 11/04/2016 0853   GFRNONAA 28 (L) 11/04/2016 0853   GFRAA 33 (L) 11/04/2016 0853    Assessment and Plan :  1. Chest pain, unspecified type Noncrdiac. - EKG 12-Lead  2. Essential (primary) hypertension   3. Gastro-esophageal reflux disease without esophagitis   4. Controlled type 2 diabetes mellitus with diabetic neuropathy, with long-term current use of insulin ([place])   5. Pure hypercholesterolemia   HPI, Exam, and A&P Transcribed under the direction and in the presence of Katilynn Sinkler L. Cranford Mon, MD  Electronically Signed: Katina Dung, [facility] 06/07/2017 3:25 PM

## 2017-06-07 NOTE — Patient Instructions (Signed)
Stephen White , Thank you for taking time to come for your Medicare Wellness Visit. I appreciate your ongoing commitment to your health goals. Please review the following plan we discussed and let me know if I can assist you in the future.   Screening recommendations/referrals: Colonoscopy: completed 02/26/09 Recommended yearly ophthalmology/optometry visit for glaucoma screening and checkup Recommended yearly dental visit for hygiene and checkup  Vaccinations: Influenza vaccine: up to date, due 08/2017 Pneumococcal vaccine: completed series Tdap vaccine: completed 07/02/15, due 06/2025 Shingles vaccine: completed 12/25/12  Advanced directives: Please bring a copy of your POA (Power of [place]) and/or Living Will to your next appointment.   Conditions/risks identified: Recommend increasing water intake to 4-6 glasses of water a day.   Next appointment: None, need to schedule 1 year AWV.  Preventive Care 79 Years and Older, Male Preventive care refers to lifestyle choices and visits with your health care provider that can promote health and wellness. What does preventive care include?  A yearly physical exam. This is also called an annual well check.  Dental exams once or twice a year.  Routine eye exams. Ask your health care provider how often you should have your eyes checked.  Personal lifestyle choices, including:  Daily care of your teeth and gums.  Regular physical activity.  Eating a healthy diet.  Avoiding tobacco and drug use.  Limiting alcohol use.  Practicing safe sex.  Taking low doses of aspirin every day.  Taking vitamin and mineral supplements as recommended by your health care provider. What happens during an annual well check? The services and screenings done by your health care provider during your annual well check will depend on your age, overall health, lifestyle risk factors, and family history of disease. Counseling  Your health care provider may  ask you questions about your:  Alcohol use.  Tobacco use.  Drug use.  Emotional well-being.  Home and relationship well-being.  Sexual activity.  Eating habits.  History of falls.  Memory and ability to understand (cognition).  Work and work Statistician. Screening  You may have the following tests or measurements:  Height, weight, and BMI.  Blood pressure.  Lipid and cholesterol levels. These may be checked every 5 years, or more frequently if you are over 79 years old.  Skin check.  Lung cancer screening. You may have this screening every year starting at age 31 if you have a 30-pack-year history of smoking and currently smoke or have quit within the past 15 years.  Fecal occult blood test (FOBT) of the stool. You may have this test every year starting at age 27.  Flexible sigmoidoscopy or colonoscopy. You may have a sigmoidoscopy every 5 years or a colonoscopy every 10 years starting at age 81.  Prostate cancer screening. Recommendations will vary depending on your family history and other risks.  Hepatitis C blood test.  Hepatitis B blood test.  Sexually transmitted disease (STD) testing.  Diabetes screening. This is done by checking your blood sugar (glucose) after you have not eaten for a while (fasting). You may have this done every 1-3 years.  Abdominal aortic aneurysm (AAA) screening. You may need this if you are a current or former smoker.  Osteoporosis. You may be screened starting at age 79 if you are at high risk. Talk with your health care provider about your test results, treatment options, and if necessary, the need for more tests. Vaccines  Your health care provider may recommend certain vaccines, such as:  Influenza  vaccine. This is recommended every year.  Tetanus, diphtheria, and acellular pertussis (Tdap, Td) vaccine. You may need a Td booster every 10 years.  Zoster vaccine. You may need this after age 53.  Pneumococcal 13-valent  conjugate (PCV13) vaccine. One dose is recommended after age 8.  Pneumococcal polysaccharide (PPSV23) vaccine. One dose is recommended after age 48. Talk to your health care provider about which screenings and vaccines you need and how often you need them. This information is not intended to replace advice given to you by your health care provider. Make sure you discuss any questions you have with your health care provider. Document Released: 01/02/2016 Document Revised: 08/25/2016 Document Reviewed: 10/07/2015 Elsevier Interactive Patient Education  2017 [place] Prevention in the Home Falls can cause injuries. They can happen to people of all ages. There are many things you can do to make your home safe and to help prevent falls. What can I do on the outside of my home?  Regularly fix the edges of walkways and driveways and fix any cracks.  Remove anything that might make you trip as you walk through a door, such as a raised step or threshold.  Trim any bushes or trees on the path to your home.  Use bright outdoor lighting.  Clear any walking paths of anything that might make someone trip, such as rocks or tools.  Regularly check to see if handrails are loose or broken. Make sure that both sides of any steps have handrails.  Any raised decks and porches should have guardrails on the edges.  Have any leaves, snow, or ice cleared regularly.  Use sand or salt on walking paths during winter.  Clean up any spills in your garage right away. This includes oil or grease spills. What can I do in the bathroom?  Use night lights.  Install grab bars by the toilet and in the tub and shower. Do not use towel bars as grab bars.  Use non-skid mats or decals in the tub or shower.  If you need to sit down in the shower, use a plastic, non-slip stool.  Keep the floor dry. Clean up any water that spills on the floor as soon as it happens.  Remove soap buildup in the tub or  shower regularly.  Attach bath mats securely with double-sided non-slip rug tape.  Do not have throw rugs and other things on the floor that can make you trip. What can I do in the bedroom?  Use night lights.  Make sure that you have a light by your bed that is easy to reach.  Do not use any sheets or blankets that are too big for your bed. They should not hang down onto the floor.  Have a firm chair that has side arms. You can use this for support while you get dressed.  Do not have throw rugs and other things on the floor that can make you trip. What can I do in the kitchen?  Clean up any spills right away.  Avoid walking on wet floors.  Keep items that you use a lot in easy-to-reach places.  If you need to reach something above you, use a strong step stool that has a grab bar.  Keep electrical cords out of the way.  Do not use floor polish or wax that makes floors slippery. If you must use wax, use non-skid floor wax.  Do not have throw rugs and other things on the floor that can  make you trip. What can I do with my stairs?  Do not leave any items on the stairs.  Make sure that there are handrails on both sides of the stairs and use them. Fix handrails that are broken or loose. Make sure that handrails are as long as the stairways.  Check any carpeting to make sure that it is firmly attached to the stairs. Fix any carpet that is loose or worn.  Avoid having throw rugs at the top or bottom of the stairs. If you do have throw rugs, attach them to the floor with carpet tape.  Make sure that you have a light switch at the top of the stairs and the bottom of the stairs. If you do not have them, ask someone to add them for you. What else can I do to help prevent falls?  Wear shoes that:  Do not have high heels.  Have rubber bottoms.  Are comfortable and fit you well.  Are closed at the toe. Do not wear sandals.  If you use a stepladder:  Make sure that it is fully  opened. Do not climb a closed stepladder.  Make sure that both sides of the stepladder are locked into place.  Ask someone to hold it for you, if possible.  Clearly mark and make sure that you can see:  Any grab bars or handrails.  First and last steps.  Where the edge of each step is.  Use tools that help you move around (mobility aids) if they are needed. These include:  Canes.  Walkers.  Scooters.  Crutches.  Turn on the lights when you go into a dark area. Replace any light bulbs as soon as they burn out.  Set up your furniture so you have a clear path. Avoid moving your furniture around.  If any of your floors are uneven, fix them.  If there are any pets around you, be aware of where they are.  Review your medicines with your doctor. Some medicines can make you feel dizzy. This can increase your chance of falling. Ask your doctor what other things that you can do to help prevent falls. This information is not intended to replace advice given to you by your health care provider. Make sure you discuss any questions you have with your health care provider. Document Released: 10/02/2009 Document Revised: 05/13/2016 Document Reviewed: 01/10/2015 Elsevier Interactive Patient Education  2017 [employer].

## 2017-07-11 ENCOUNTER — Telehealth: Payer: Self-pay | Admitting: Family Medicine

## 2017-07-11 MED ORDER — MECLIZINE HCL 25 MG PO TABS
25.0000 mg | ORAL_TABLET | Freq: Three times a day (TID) | ORAL | 1 refills | Status: DC | PRN
Start: 2017-07-11 — End: 2017-11-21

## 2017-07-11 NOTE — Telephone Encounter (Signed)
RX sent to [facility]. Left patient a message on home voicemail advising him.

## 2017-07-11 NOTE — Telephone Encounter (Signed)
Antivert 25 mg TID prn ,#35,1rf/

## 2017-07-11 NOTE — Telephone Encounter (Signed)
Pt has been having vertigo.  Pt wants Dr. Rayetta Pigg to prescribe him something.  Pt does not want to come in.  I offered him an appt telling him I am not sure something could be called in without him being seen.   Pt's call back is [phone]  He uses [facility]  Thanks Con Memos

## 2017-07-15 DIAGNOSIS — R69 Illness, unspecified: Secondary | ICD-10-CM | POA: Diagnosis not present

## 2017-08-13 DIAGNOSIS — R69 Illness, unspecified: Secondary | ICD-10-CM | POA: Diagnosis not present

## 2017-09-06 DIAGNOSIS — E113393 Type 2 diabetes mellitus with moderate nonproliferative diabetic retinopathy without macular edema, bilateral: Secondary | ICD-10-CM | POA: Diagnosis not present

## 2017-09-06 DIAGNOSIS — Z794 Long term (current) use of insulin: Secondary | ICD-10-CM | POA: Diagnosis not present

## 2017-09-06 DIAGNOSIS — N183 Chronic kidney disease, stage 3 (moderate): Secondary | ICD-10-CM | POA: Diagnosis not present

## 2017-09-06 DIAGNOSIS — E1122 Type 2 diabetes mellitus with diabetic chronic kidney disease: Secondary | ICD-10-CM | POA: Diagnosis not present

## 2017-09-06 DIAGNOSIS — E1121 Type 2 diabetes mellitus with diabetic nephropathy: Secondary | ICD-10-CM | POA: Diagnosis not present

## 2017-09-06 LAB — HEMOGLOBIN A1C: HEMOGLOBIN A1C: 7.6

## 2017-09-06 LAB — MICROALBUMIN, URINE: MICROALB UR: 37

## 2017-09-08 ENCOUNTER — Other Ambulatory Visit: Payer: Self-pay | Admitting: Family Medicine

## 2017-09-09 DIAGNOSIS — Z794 Long term (current) use of insulin: Secondary | ICD-10-CM | POA: Diagnosis not present

## 2017-09-09 DIAGNOSIS — E1122 Type 2 diabetes mellitus with diabetic chronic kidney disease: Secondary | ICD-10-CM | POA: Diagnosis not present

## 2017-09-09 DIAGNOSIS — E11649 Type 2 diabetes mellitus with hypoglycemia without coma: Secondary | ICD-10-CM | POA: Diagnosis not present

## 2017-09-09 DIAGNOSIS — E113293 Type 2 diabetes mellitus with mild nonproliferative diabetic retinopathy without macular edema, bilateral: Secondary | ICD-10-CM | POA: Diagnosis not present

## 2017-09-09 DIAGNOSIS — N183 Chronic kidney disease, stage 3 (moderate): Secondary | ICD-10-CM | POA: Diagnosis not present

## 2017-09-09 DIAGNOSIS — E1121 Type 2 diabetes mellitus with diabetic nephropathy: Secondary | ICD-10-CM | POA: Diagnosis not present

## 2017-09-09 DIAGNOSIS — R69 Illness, unspecified: Secondary | ICD-10-CM | POA: Diagnosis not present

## 2017-09-12 DIAGNOSIS — E113313 Type 2 diabetes mellitus with moderate nonproliferative diabetic retinopathy with macular edema, bilateral: Secondary | ICD-10-CM | POA: Diagnosis not present

## 2017-09-12 DIAGNOSIS — H2512 Age-related nuclear cataract, left eye: Secondary | ICD-10-CM | POA: Diagnosis not present

## 2017-09-19 DIAGNOSIS — D1800 Hemangioma unspecified site: Secondary | ICD-10-CM | POA: Diagnosis not present

## 2017-09-19 DIAGNOSIS — L578 Other skin changes due to chronic exposure to nonionizing radiation: Secondary | ICD-10-CM | POA: Diagnosis not present

## 2017-09-19 DIAGNOSIS — L821 Other seborrheic keratosis: Secondary | ICD-10-CM | POA: Diagnosis not present

## 2017-09-19 DIAGNOSIS — Z1283 Encounter for screening for malignant neoplasm of skin: Secondary | ICD-10-CM | POA: Diagnosis not present

## 2017-09-19 DIAGNOSIS — D229 Melanocytic nevi, unspecified: Secondary | ICD-10-CM | POA: Diagnosis not present

## 2017-09-19 DIAGNOSIS — L820 Inflamed seborrheic keratosis: Secondary | ICD-10-CM | POA: Diagnosis not present

## 2017-09-19 DIAGNOSIS — L570 Actinic keratosis: Secondary | ICD-10-CM | POA: Diagnosis not present

## 2017-09-19 DIAGNOSIS — L812 Freckles: Secondary | ICD-10-CM | POA: Diagnosis not present

## 2017-10-06 DIAGNOSIS — R69 Illness, unspecified: Secondary | ICD-10-CM | POA: Diagnosis not present

## 2017-10-10 DIAGNOSIS — R69 Illness, unspecified: Secondary | ICD-10-CM | POA: Diagnosis not present

## 2017-10-26 ENCOUNTER — Other Ambulatory Visit: Payer: Self-pay | Admitting: Family Medicine

## 2017-10-26 MED ORDER — LOSARTAN POTASSIUM 100 MG PO TABS: 100.0000 mg | ORAL_TABLET | Freq: Every day | ORAL | 3 refills | Status: DC

## 2017-10-26 NOTE — Telephone Encounter (Signed)
[facility] faxed a request for the following medication. Thanks [facility]  losartan (COZAAR) 100 MG tablet

## 2017-10-27 ENCOUNTER — Other Ambulatory Visit: Payer: Self-pay

## 2017-10-27 ENCOUNTER — Telehealth: Payer: Self-pay

## 2017-10-27 MED ORDER — RANITIDINE HCL 150 MG PO TABS: 150.0000 mg | ORAL_TABLET | Freq: Two times a day (BID) | ORAL | 3 refills | Status: DC

## 2017-10-27 NOTE — Telephone Encounter (Signed)
Patient's wife, Mardene Celeste is calling back saying a nurse called her about a medication refill (?) please call her back. Contact number is correct. Thanks!

## 2017-10-27 NOTE — Telephone Encounter (Signed)
Despite our chart records verified today patient is taking Ranitidine 2 times daily and not Omeprazole. Medications updated and refill sent in-Anastasiya Estell Harpin, RMA

## 2017-11-03 ENCOUNTER — Ambulatory Visit: Payer: Medicare HMO | Admitting: Family Medicine

## 2017-11-08 DIAGNOSIS — R69 Illness, unspecified: Secondary | ICD-10-CM | POA: Diagnosis not present

## 2017-11-21 ENCOUNTER — Ambulatory Visit: Payer: Medicare HMO | Admitting: Family Medicine

## 2017-11-21 VITALS — BP 136/56 | HR 68 | Temp 98.1°F | Resp 16 | Wt 198.6 lb

## 2017-11-21 DIAGNOSIS — E1140 Type 2 diabetes mellitus with diabetic neuropathy, unspecified: Secondary | ICD-10-CM

## 2017-11-21 DIAGNOSIS — E7800 Pure hypercholesterolemia, unspecified: Secondary | ICD-10-CM

## 2017-11-21 DIAGNOSIS — Z794 Long term (current) use of insulin: Secondary | ICD-10-CM

## 2017-11-21 DIAGNOSIS — I10 Essential (primary) hypertension: Secondary | ICD-10-CM | POA: Diagnosis not present

## 2017-11-21 DIAGNOSIS — K219 Gastro-esophageal reflux disease without esophagitis: Secondary | ICD-10-CM | POA: Diagnosis not present

## 2017-11-21 NOTE — Progress Notes (Signed)
Stephen White  MRN: [id number] DOB: [birth year]  Subjective:  HPI  Patient is here for 6 months follow up. Last office visit was on 06/07/17. HTN: patient does not check his b/p. No cardiac symptoms. BP Readings from Last 3 Encounters:  11/21/17 (!) 136/56  06/07/17 (!) 132/56  06/07/17 (!) 132/56   Wt Readings from Last 3 Encounters:  11/21/17 198 lb 9.6 oz (90.1 kg)  06/07/17 194 lb (88 kg)  06/07/17 194 lb 12.8 oz (88.4 kg)   Patient is still seen [name] for diabetic follow up. Last lab work with [place] was done on 11/04/16. BMP was done with [name] on 09/06/17.  Patient Active Problem List   Diagnosis Date Noted  . Flatulence 11/03/2016  . Multiple actinic keratoses 05/05/2015  . Arthropathia 05/05/2015  . CAFL (chronic airflow limitation) ([place]) 05/05/2015  . 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 Gastro-esophageal reflux disease without esophagitis 05/05/2015  . Retinopathy 12/23/2014  . Long term current use of insulin ([place]) 12/23/2014  . Microalbuminuria 12/23/2014  . Type 2 diabetes mellitus treated with insulin ([place]) 05/29/2010  . Chronic kidney disease 05/07/2010  . Allergic rhinitis 05/14/2008  . Essential (primary) hypertension 10/18/2007  . HLD (hyperlipidemia) 09/06/2007    Past Medical History:  Diagnosis Date  . Chronic kidney disease   . Diabetes mellitus without complication ([place])   . GERD (gastroesophageal reflux disease)   . Hyperlipidemia   . Hypertension   . Thyroid disease     Social History   Socioeconomic History  . Marital status: Married    Spouse name: Not on file  . Number of children: Not on file  . Years of education: Not on file  . Highest education level: Not on file  Social Needs  . Financial resource strain: Not on file  . Food insecurity - worry: Not on file  . Food insecurity - inability: Not on file  . Transportation needs - medical: Not on file  . Transportation needs - non-medical: Not on file  Occupational  History  . Not on file  Tobacco Use  . Smoking status: Never Smoker  . Smokeless tobacco: Never Used  Substance and Sexual Activity  . Alcohol use: No    Alcohol/week: 0.0 oz  . Drug use: No  . Sexual activity: Not on file  Other Topics Concern  . Not on file  Social History Narrative  . Not on file    Outpatient Encounter Medications as of 11/21/2017  Medication Sig Note  . amLODipine (NORVASC) 5 MG tablet Take 5 mg by mouth daily.  10/03/2015: Received from: External Pharmacy  . aspirin 81 MG tablet Take by mouth. 05/05/2015: Received from: [facility]  . FLUZONE HIGH-DOSE 0.5 ML injection    . hydrochlorothiazide (HYDRODIURIL) 25 MG tablet Take 25 mg by mouth daily.  10/03/2015: Received from: External Pharmacy  . insulin NPH Human (HUMULIN N,NOVOLIN N) 100 UNIT/ML injection Inject 15 Units into the skin daily before breakfast.  11/03/2016: Received from: [facility]: Inject subcutaneously. Inject 15 units every morning and 22 units every evening  . insulin regular (NOVOLIN R,HUMULIN R) 250 units/2.24mL (100 units/mL) injection Inject 15 units at breakfast, 15 units at lunch and 20 units at supper 11/03/2016: Received from: [facility]  . losartan (COZAAR) 100 MG tablet Take 1 tablet (100 mg total) daily by mouth.   . MULTIPLE VITAMIN PO Take by mouth. 05/05/2015: Received from: [facility]  .  ONE TOUCH ULTRA TEST test strip  10/03/2015: Received from: External Pharmacy  . pravastatin (PRAVACHOL) 10 MG tablet TAKE ONE (1) TABLET BY MOUTH EVERY DAY   . ranitidine (ZANTAC) 150 MG tablet Take 1 tablet (150 mg total) 2 (two) times daily by mouth.   . [DISCONTINUED] magnesium oxide (MAG-OX) 400 (241.3 MG) MG tablet Take by mouth. 05/05/2015: Received from: [facility]  . [DISCONTINUED] meclizine (ANTIVERT) 25 MG tablet Take 1 tablet (25 mg total) by mouth 3 (three) times daily as needed for  dizziness.    No facility-administered encounter medications on file as of 11/21/2017.     No Known Allergies  Review of Systems  Constitutional: Negative.   Eyes: Negative.   Respiratory: Negative.   Cardiovascular: Negative.   Gastrointestinal: Negative.   Musculoskeletal: Negative.   Skin: Negative.   Neurological: Negative.   Endo/Heme/Allergies: Negative.   Psychiatric/Behavioral: Negative.     Objective:  BP (!) 136/56   Pulse 68   Temp 98.1 F (36.7 C)   Resp 16   Wt 198 lb 9.6 oz (90.1 kg)   BMI 30.20 kg/m   Physical Exam  Constitutional: He is oriented to person, place, and time and well-developed, well-nourished, and in no distress.  HENT:  Head: Normocephalic and atraumatic.  Right Ear: External ear normal.  Left Ear: External ear normal.  Nose: Nose normal.  Eyes: Conjunctivae are normal.  Neck: No thyromegaly present.  Cardiovascular: Normal rate, regular rhythm and normal heart sounds.  Pulmonary/Chest: Effort normal and breath sounds normal.  Abdominal: Soft.  Lymphadenopathy:    He has no cervical adenopathy.  Neurological: He is alert and oriented to person, place, and time. Gait normal. GCS score is 15.  Skin: Skin is warm and dry.  Psychiatric: Mood, memory, affect and judgment normal.    Assessment and Plan :  1. Essential (primary) hypertension Stable. - CBC with Differential/Platelet - COMPLETE METABOLIC PANEL WITH GFR - TSH  2. Gastro-esophageal reflux disease without esophagitis Stable. Patient advised to verify his medication at home and compare to the list we have on file. - TSH  3. Controlled type 2 diabetes mellitus with diabetic neuropathy, with long-term current use of insulin ([facility]) Follows [name].  4. Pure hypercholesterolemia - COMPLETE METABOLIC PANEL WITH GFR - Lipid Profile  HPI, Exam and A&P transcribed by Tiffany Kocher, RMA under direction and in the presence of Miguel Aschoff, MD. I have done the exam and  reviewed the chart and it is accurate to the best of my knowledge. Development worker, community has been used and  any errors in dictation or transcription are unintentional. Miguel Aschoff M.D. [facility]

## 2017-12-05 DIAGNOSIS — E7800 Pure hypercholesterolemia, unspecified: Secondary | ICD-10-CM | POA: Diagnosis not present

## 2017-12-05 DIAGNOSIS — K219 Gastro-esophageal reflux disease without esophagitis: Secondary | ICD-10-CM | POA: Diagnosis not present

## 2017-12-05 DIAGNOSIS — I10 Essential (primary) hypertension: Secondary | ICD-10-CM | POA: Diagnosis not present

## 2017-12-05 LAB — CBC WITH DIFFERENTIAL/PLATELET
BASOS ABS: 176 {cells}/uL (ref 0–200)
Basophils Relative: 1.6 %
Eosinophils Absolute: 1551 cells/uL — ABNORMAL HIGH (ref 15–500)
Eosinophils Relative: 14.1 %
HCT: 43.3 % (ref 38.5–50.0)
Hemoglobin: 14.7 g/dL (ref 13.2–17.1)
Lymphs Abs: 3267 cells/uL (ref 850–3900)
MCH: 31 pg (ref 27.0–33.0)
MCHC: 33.9 g/dL (ref 32.0–36.0)
MCV: 91.4 fL (ref 80.0–100.0)
MONOS PCT: 8.8 %
MPV: 11.5 fL (ref 7.5–12.5)
Neutro Abs: 5038 cells/uL (ref 1500–7800)
Neutrophils Relative %: 45.8 %
PLATELETS: 253 10*3/uL (ref 140–400)
RBC: 4.74 10*6/uL (ref 4.20–5.80)
RDW: 12.2 % (ref 11.0–15.0)
TOTAL LYMPHOCYTE: 29.7 %
WBC mixed population: 968 cells/uL — ABNORMAL HIGH (ref 200–950)
WBC: 11 10*3/uL — ABNORMAL HIGH (ref 3.8–10.8)

## 2017-12-05 LAB — COMPLETE METABOLIC PANEL WITH GFR
AG RATIO: 1.6 (calc) (ref 1.0–2.5)
ALBUMIN MSPROF: 3.9 g/dL (ref 3.6–5.1)
ALKALINE PHOSPHATASE (APISO): 46 U/L (ref 40–115)
ALT: 17 U/L (ref 9–46)
AST: 18 U/L (ref 10–35)
BILIRUBIN TOTAL: 0.6 mg/dL (ref 0.2–1.2)
BUN / CREAT RATIO: 13 (calc) (ref 6–22)
BUN: 30 mg/dL — AB (ref 7–25)
CHLORIDE: 102 mmol/L (ref 98–110)
CO2: 29 mmol/L (ref 20–32)
Calcium: 9.3 mg/dL (ref 8.6–10.3)
Creat: 2.35 mg/dL — ABNORMAL HIGH (ref 0.70–1.18)
GFR, Est African American: 30 mL/min/{1.73_m2} — ABNORMAL LOW (ref >=60)
GFR, Est Non African American: 26 mL/min/{1.73_m2} — ABNORMAL LOW (ref >=60)
GLUCOSE: 133 mg/dL — AB (ref 65–99)
Globulin: 2.5 g/dL (calc) (ref 1.9–3.7)
POTASSIUM: 4.3 mmol/L (ref 3.5–5.3)
Sodium: 139 mmol/L (ref 135–146)
Total Protein: 6.4 g/dL (ref 6.1–8.1)

## 2017-12-05 LAB — LIPID PANEL
Cholesterol: 178 mg/dL (ref <200)
HDL: 59 mg/dL (ref >40)
LDL Cholesterol (Calc): 99 mg/dL (calc)
Non-HDL Cholesterol (Calc): 119 mg/dL (calc) (ref <130)
TRIGLYCERIDES: 105 mg/dL (ref <150)
Total CHOL/HDL Ratio: 3 (calc) (ref <5.0)

## 2017-12-05 LAB — TSH: TSH: 2.31 m[IU]/L (ref 0.40–4.50)

## 2017-12-06 ENCOUNTER — Telehealth: Payer: Self-pay | Admitting: Family Medicine

## 2017-12-06 NOTE — Telephone Encounter (Signed)
Patient advised as below.  

## 2017-12-06 NOTE — Telephone Encounter (Signed)
-----   Message from Jerrol Banana., MD sent at 12/06/2017  9:18 AM EST ----- Stable for now.

## 2017-12-09 DIAGNOSIS — R69 Illness, unspecified: Secondary | ICD-10-CM | POA: Diagnosis not present

## 2017-12-12 DIAGNOSIS — E119 Type 2 diabetes mellitus without complications: Secondary | ICD-10-CM | POA: Diagnosis not present

## 2017-12-23 DIAGNOSIS — Z794 Long term (current) use of insulin: Secondary | ICD-10-CM | POA: Diagnosis not present

## 2017-12-23 DIAGNOSIS — E11649 Type 2 diabetes mellitus with hypoglycemia without coma: Secondary | ICD-10-CM | POA: Diagnosis not present

## 2017-12-30 DIAGNOSIS — Z794 Long term (current) use of insulin: Secondary | ICD-10-CM | POA: Diagnosis not present

## 2017-12-30 DIAGNOSIS — N184 Chronic kidney disease, stage 4 (severe): Secondary | ICD-10-CM | POA: Diagnosis not present

## 2017-12-30 DIAGNOSIS — E113293 Type 2 diabetes mellitus with mild nonproliferative diabetic retinopathy without macular edema, bilateral: Secondary | ICD-10-CM | POA: Diagnosis not present

## 2017-12-30 DIAGNOSIS — E1122 Type 2 diabetes mellitus with diabetic chronic kidney disease: Secondary | ICD-10-CM | POA: Diagnosis not present

## 2017-12-30 DIAGNOSIS — E1165 Type 2 diabetes mellitus with hyperglycemia: Secondary | ICD-10-CM | POA: Diagnosis not present

## 2017-12-30 DIAGNOSIS — E1121 Type 2 diabetes mellitus with diabetic nephropathy: Secondary | ICD-10-CM | POA: Diagnosis not present

## 2018-01-11 DIAGNOSIS — E119 Type 2 diabetes mellitus without complications: Secondary | ICD-10-CM | POA: Diagnosis not present

## 2018-01-23 DIAGNOSIS — R234 Changes in skin texture: Secondary | ICD-10-CM | POA: Diagnosis not present

## 2018-01-23 DIAGNOSIS — L570 Actinic keratosis: Secondary | ICD-10-CM | POA: Diagnosis not present

## 2018-01-23 DIAGNOSIS — L578 Other skin changes due to chronic exposure to nonionizing radiation: Secondary | ICD-10-CM | POA: Diagnosis not present

## 2018-01-23 DIAGNOSIS — L821 Other seborrheic keratosis: Secondary | ICD-10-CM | POA: Diagnosis not present

## 2018-01-23 DIAGNOSIS — L812 Freckles: Secondary | ICD-10-CM | POA: Diagnosis not present

## 2018-01-25 ENCOUNTER — Encounter: Payer: Self-pay | Admitting: Family Medicine

## 2018-01-25 ENCOUNTER — Ambulatory Visit (INDEPENDENT_AMBULATORY_CARE_PROVIDER_SITE_OTHER): Payer: Medicare HMO | Admitting: Family Medicine

## 2018-01-25 ENCOUNTER — Ambulatory Visit
Admission: RE | Admit: 2018-01-25 | Discharge: 2018-01-25 | Disposition: A | Discharge status: Home / Self Care | Payer: Medicare HMO | Source: Ambulatory Visit | Attending: Family Medicine | Admitting: Family Medicine

## 2018-01-25 VITALS — BP 110/62 | HR 77 | Temp 97.7°F | Resp 16

## 2018-01-25 DIAGNOSIS — Z794 Long term (current) use of insulin: Secondary | ICD-10-CM

## 2018-01-25 DIAGNOSIS — M5136 Other intervertebral disc degeneration, lumbar region: Secondary | ICD-10-CM | POA: Diagnosis not present

## 2018-01-25 DIAGNOSIS — E119 Type 2 diabetes mellitus without complications: Secondary | ICD-10-CM

## 2018-01-25 DIAGNOSIS — M545 Low back pain, unspecified: Secondary | ICD-10-CM

## 2018-01-25 DIAGNOSIS — I10 Essential (primary) hypertension: Secondary | ICD-10-CM

## 2018-01-25 DIAGNOSIS — E7800 Pure hypercholesterolemia, unspecified: Secondary | ICD-10-CM | POA: Diagnosis not present

## 2018-01-25 MED ORDER — NAPROXEN 375 MG PO TABS: 375.0000 mg | ORAL_TABLET | Freq: Two times a day (BID) | ORAL | 0 refills | Status: DC

## 2018-01-25 NOTE — Progress Notes (Signed)
Patient: Stephen White Male    DOB: 07-Sep-1936   82 y.o.   MRN: [id number] Visit Date: 01/25/2018  Today's Provider: Wilhemena Durie, MD   Chief Complaint  Patient presents with  . Back Pain   Subjective:    Back Pain  This is a new problem. Episode onset: x 2-3 weeks. The problem occurs constantly. The problem is unchanged. The pain is present in the gluteal (bilateral; right worse than left). The quality of the pain is described as aching. The pain does not radiate. The symptoms are aggravated by standing. Associated symptoms include weakness. Pertinent negatives include no abdominal pain, bladder incontinence, bowel incontinence, chest pain, dysuria, fever, headaches, leg pain, numbness or tingling. He has tried NSAIDs for the symptoms. The treatment provided moderate relief.       No Known Allergies   Current Outpatient Medications:  .  amLODipine (NORVASC) 5 MG tablet, Take 5 mg by mouth daily. , Disp: , Rfl:  .  aspirin 81 MG tablet, Take by mouth., Disp: , Rfl:  .  hydrochlorothiazide (HYDRODIURIL) 25 MG tablet, Take 25 mg by mouth daily., Disp: , Rfl:  .  insulin NPH Human (HUMULIN N,NOVOLIN N) 100 UNIT/ML injection, Inject 15 Units into the skin daily before breakfast. , Disp: , Rfl:  .  insulin regular (NOVOLIN R,HUMULIN R) 250 units/2.47mL (100 units/mL) injection, Inject 15 units at breakfast, and 22 units at supper, Disp: , Rfl:  .  losartan (COZAAR) 100 MG tablet, Take 1 tablet (100 mg total) daily by mouth., Disp: 90 tablet, Rfl: 3 .  MULTIPLE VITAMIN PO, Take by mouth., Disp: , Rfl:  .  ONE TOUCH ULTRA TEST test strip, , Disp: , Rfl:  .  pravastatin (PRAVACHOL) 10 MG tablet, TAKE ONE (1) TABLET BY MOUTH EVERY DAY, Disp: 90 tablet, Rfl: 3 .  ranitidine (ZANTAC) 150 MG tablet, Take 1 tablet (150 mg total) 2 (two) times daily by mouth., Disp: 180 tablet, Rfl: 3  Review of Systems  Constitutional: Negative for fever.  Cardiovascular: Negative for chest pain.    Gastrointestinal: Negative for abdominal pain and bowel incontinence.  Genitourinary: Negative for bladder incontinence and dysuria.  Musculoskeletal: Positive for back pain.  Neurological: Positive for weakness. Negative for tingling, numbness and headaches.    Social History   Tobacco Use  . Smoking status: Former Smoker    Packs/day: 0.50    Years: 15.00    Pack years: 7.50    Types: Cigarettes    Last attempt to quit: 12/19/1978    Years since quitting: 39.1  . Smokeless tobacco: Never Used  Substance Use Topics  . Alcohol use: No    Alcohol/week: 0.0 oz   Objective:   BP 110/62 (BP Location: Right Arm, Patient Position: Sitting, Cuff Size: Large)   Pulse 77   Temp 97.7 F (36.5 C) (Oral)   Resp 16   SpO2 95%  Vitals:   01/25/18 1335  BP: 110/62  Pulse: 77  Resp: 16  Temp: 97.7 F (36.5 C)  TempSrc: Oral  SpO2: 95%     Physical Exam  Constitutional: He is oriented to person, place, and time. He appears well-developed and well-nourished.  HENT:  Head: Normocephalic and atraumatic.  Eyes: Conjunctivae are normal. No scleral icterus.  Neck: No thyromegaly present.  Cardiovascular: Normal rate, regular rhythm and normal heart sounds.  Pulmonary/Chest: Effort normal and breath sounds normal.  Abdominal: Soft. There is no tenderness. There is no  CVA tenderness.  Musculoskeletal:  Straight leg raise negative.  Neurological: He is alert and oriented to person, place, and time.  Skin: Skin is warm and dry.  Psychiatric: He has a normal mood and affect. His behavior is normal. Judgment and thought content normal.        Assessment & Plan:     1. Acute bilateral low back pain without sciatica Consider PT.RTC 2-3 weeks. - naproxen (NAPROSYN) 375 MG tablet; Take 1 tablet (375 mg total) by mouth 2 (two) times daily with a meal.  Dispense: 60 tablet; Refill: 0 - DG Lumbar Spine Complete 2.DM 3.HTN 4.HLD    Patient seen and examined by Miguel Aschoff, MD,  and note scribed by Martha Clan, CMA. I have done the exam and reviewed the above chart and it is accurate to the best of my knowledge. Development worker, community has been used in this note in any air is in the dictation or transcription are unintentional. Wilhemena Durie, MD  [place]

## 2018-02-07 DIAGNOSIS — L570 Actinic keratosis: Secondary | ICD-10-CM | POA: Diagnosis not present

## 2018-02-08 ENCOUNTER — Ambulatory Visit: Payer: Self-pay | Admitting: Family Medicine

## 2018-02-18 DIAGNOSIS — R69 Illness, unspecified: Secondary | ICD-10-CM | POA: Diagnosis not present

## 2018-02-22 DIAGNOSIS — I10 Essential (primary) hypertension: Secondary | ICD-10-CM | POA: Diagnosis not present

## 2018-02-22 DIAGNOSIS — E669 Obesity, unspecified: Secondary | ICD-10-CM | POA: Diagnosis not present

## 2018-02-22 DIAGNOSIS — K219 Gastro-esophageal reflux disease without esophagitis: Secondary | ICD-10-CM | POA: Diagnosis not present

## 2018-02-22 DIAGNOSIS — E785 Hyperlipidemia, unspecified: Secondary | ICD-10-CM | POA: Diagnosis not present

## 2018-02-22 DIAGNOSIS — Z809 Family history of malignant neoplasm, unspecified: Secondary | ICD-10-CM | POA: Diagnosis not present

## 2018-02-22 DIAGNOSIS — Z6830 Body mass index (BMI) 30.0-30.9, adult: Secondary | ICD-10-CM | POA: Diagnosis not present

## 2018-02-22 DIAGNOSIS — Z791 Long term (current) use of non-steroidal anti-inflammatories (NSAID): Secondary | ICD-10-CM | POA: Diagnosis not present

## 2018-02-22 DIAGNOSIS — E119 Type 2 diabetes mellitus without complications: Secondary | ICD-10-CM | POA: Diagnosis not present

## 2018-02-22 DIAGNOSIS — Z794 Long term (current) use of insulin: Secondary | ICD-10-CM | POA: Diagnosis not present

## 2018-02-22 DIAGNOSIS — G8929 Other chronic pain: Secondary | ICD-10-CM | POA: Diagnosis not present

## 2018-03-03 DIAGNOSIS — E119 Type 2 diabetes mellitus without complications: Secondary | ICD-10-CM | POA: Diagnosis not present

## 2018-03-13 DIAGNOSIS — E113313 Type 2 diabetes mellitus with moderate nonproliferative diabetic retinopathy with macular edema, bilateral: Secondary | ICD-10-CM | POA: Diagnosis not present

## 2018-03-13 DIAGNOSIS — H2512 Age-related nuclear cataract, left eye: Secondary | ICD-10-CM | POA: Diagnosis not present

## 2018-03-13 LAB — HM DIABETES EYE EXAM

## 2018-03-28 DIAGNOSIS — E1165 Type 2 diabetes mellitus with hyperglycemia: Secondary | ICD-10-CM | POA: Diagnosis not present

## 2018-03-28 DIAGNOSIS — N184 Chronic kidney disease, stage 4 (severe): Secondary | ICD-10-CM | POA: Diagnosis not present

## 2018-03-28 DIAGNOSIS — E1122 Type 2 diabetes mellitus with diabetic chronic kidney disease: Secondary | ICD-10-CM | POA: Diagnosis not present

## 2018-03-28 DIAGNOSIS — Z794 Long term (current) use of insulin: Secondary | ICD-10-CM | POA: Diagnosis not present

## 2018-03-31 DIAGNOSIS — H9190 Unspecified hearing loss, unspecified ear: Secondary | ICD-10-CM | POA: Diagnosis not present

## 2018-04-03 DIAGNOSIS — E119 Type 2 diabetes mellitus without complications: Secondary | ICD-10-CM | POA: Diagnosis not present

## 2018-04-04 DIAGNOSIS — E1121 Type 2 diabetes mellitus with diabetic nephropathy: Secondary | ICD-10-CM | POA: Diagnosis not present

## 2018-04-04 DIAGNOSIS — Z794 Long term (current) use of insulin: Secondary | ICD-10-CM | POA: Diagnosis not present

## 2018-04-04 DIAGNOSIS — E1165 Type 2 diabetes mellitus with hyperglycemia: Secondary | ICD-10-CM | POA: Diagnosis not present

## 2018-04-04 DIAGNOSIS — E113293 Type 2 diabetes mellitus with mild nonproliferative diabetic retinopathy without macular edema, bilateral: Secondary | ICD-10-CM | POA: Diagnosis not present

## 2018-04-04 DIAGNOSIS — N184 Chronic kidney disease, stage 4 (severe): Secondary | ICD-10-CM | POA: Diagnosis not present

## 2018-04-04 DIAGNOSIS — E1122 Type 2 diabetes mellitus with diabetic chronic kidney disease: Secondary | ICD-10-CM | POA: Diagnosis not present

## 2018-04-24 DIAGNOSIS — C44219 Basal cell carcinoma of skin of left ear and external auricular canal: Secondary | ICD-10-CM | POA: Diagnosis not present

## 2018-04-24 DIAGNOSIS — D485 Neoplasm of uncertain behavior of skin: Secondary | ICD-10-CM | POA: Diagnosis not present

## 2018-04-24 DIAGNOSIS — L570 Actinic keratosis: Secondary | ICD-10-CM | POA: Diagnosis not present

## 2018-04-24 DIAGNOSIS — E119 Type 2 diabetes mellitus without complications: Secondary | ICD-10-CM | POA: Diagnosis not present

## 2018-04-24 DIAGNOSIS — L578 Other skin changes due to chronic exposure to nonionizing radiation: Secondary | ICD-10-CM | POA: Diagnosis not present

## 2018-04-25 ENCOUNTER — Other Ambulatory Visit: Payer: Self-pay | Admitting: Family Medicine

## 2018-04-25 ENCOUNTER — Ambulatory Visit: Payer: Medicare HMO | Admitting: Family Medicine

## 2018-04-25 ENCOUNTER — Encounter: Payer: Self-pay | Admitting: Family Medicine

## 2018-04-25 VITALS — BP 132/62 | HR 68 | Temp 97.9°F | Resp 16 | Wt 198.0 lb

## 2018-04-25 DIAGNOSIS — G8929 Other chronic pain: Secondary | ICD-10-CM

## 2018-04-25 DIAGNOSIS — M5136 Other intervertebral disc degeneration, lumbar region: Secondary | ICD-10-CM

## 2018-04-25 DIAGNOSIS — I10 Essential (primary) hypertension: Secondary | ICD-10-CM

## 2018-04-25 DIAGNOSIS — Z794 Long term (current) use of insulin: Secondary | ICD-10-CM

## 2018-04-25 DIAGNOSIS — E119 Type 2 diabetes mellitus without complications: Secondary | ICD-10-CM

## 2018-04-25 DIAGNOSIS — M545 Low back pain: Secondary | ICD-10-CM

## 2018-04-25 MED ORDER — CYCLOBENZAPRINE HCL 5 MG PO TABS: 5.0000 mg | ORAL_TABLET | Freq: Three times a day (TID) | ORAL | 2 refills | Status: DC | PRN

## 2018-04-25 MED ORDER — PREDNISONE 10 MG (21) PO TBPK: ORAL_TABLET | ORAL | 0 refills | Status: DC

## 2018-04-25 MED ORDER — IBUPROFEN 600 MG PO TABS: 600.0000 mg | ORAL_TABLET | Freq: Three times a day (TID) | ORAL | 1 refills | Status: DC | PRN

## 2018-04-25 NOTE — Progress Notes (Signed)
Patient: Stephen White Male    DOB: Mar 29, 1936   82 y.o.   MRN: [id number] Visit Date: 04/25/2018  Today's Provider: Wilhemena Durie, MD   Chief Complaint  Patient presents with  . Back Pain   Subjective:    HPI Pt is here for back pain. He reports that he was seen on 01/25/18 and treated with naproxen. He reports that this did not help. He report that ibuprofen helps better than the Naproxen. X- rays showed DDD. 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 He reports that pain is worse when he stands up to walk.  The pain is better today, but it has down this before.        No Known Allergies   Current Outpatient Medications:  .  amLODipine (NORVASC) 5 MG tablet, Take 5 mg by mouth daily. , Disp: , Rfl:  .  aspirin 81 MG tablet, Take by mouth., Disp: , Rfl:  .  hydrochlorothiazide (HYDRODIURIL) 25 MG tablet, Take 25 mg by mouth daily., Disp: , Rfl:  .  insulin NPH Human (HUMULIN N,NOVOLIN N) 100 UNIT/ML injection, Inject 15 Units into the skin daily before breakfast. , Disp: , Rfl:  .  insulin regular (NOVOLIN R,HUMULIN R) 250 units/2.56mL (100 units/mL) injection, Inject 15 units at breakfast, and 22 units at supper, Disp: , Rfl:  .  losartan (COZAAR) 100 MG tablet, Take 1 tablet (100 mg total) daily by mouth., Disp: 90 tablet, Rfl: 3 .  ONE TOUCH ULTRA TEST test strip, , Disp: , Rfl:  .  pravastatin (PRAVACHOL) 10 MG tablet, TAKE ONE (1) TABLET BY MOUTH EVERY DAY, Disp: 90 tablet, Rfl: 3 .  ranitidine (ZANTAC) 150 MG tablet, Take 1 tablet (150 mg total) 2 (two) times daily by mouth., Disp: 180 tablet, Rfl: 3 .  MULTIPLE VITAMIN PO, Take by mouth., Disp: , Rfl:  .  naproxen (NAPROSYN) 375 MG tablet, Take 1 tablet (375 mg total) by mouth 2 (two) times daily with a meal. (Patient not taking: Reported on 04/25/2018), Disp: 60 tablet, Rfl: 0  Review of Systems    Constitutional: Negative.   HENT: Negative.   Eyes: Negative.   Respiratory: Negative.   Cardiovascular: Negative.   Gastrointestinal: Negative.   Endocrine: Negative.   Genitourinary: Negative.   Musculoskeletal: Positive for back pain.  Skin: Negative.   Allergic/Immunologic: Negative.   Neurological: Negative.   Hematological: Negative.   Psychiatric/Behavioral: Negative.     Social History   Tobacco Use  . Smoking status: Former Smoker    Packs/day: 0.50    Years: 15.00    Pack years: 7.50    Types: Cigarettes    Last attempt to quit: 12/19/1978    Years since quitting: 39.3  . Smokeless tobacco: Never Used  Substance Use Topics  . Alcohol use: No    Alcohol/week: 0.0 oz   Objective:   BP 132/62 (BP Location: Left Arm, Patient Position: Sitting, Cuff Size: Normal)   Pulse 68   Temp 97.9 F (36.6 C) (Oral)   Resp 16   Wt 198 lb (89.8 kg)   BMI 30.11 kg/m  Vitals:   04/25/18 0956  BP: 132/62  Pulse: 68  Resp: 16  Temp: 97.9 F (36.6 C)  TempSrc: Oral  Weight: 198 lb (89.8 kg)     Physical Exam  Constitutional: He is oriented to person, place, and time. He appears well-developed and well-nourished.  HENT:  Head: Normocephalic and atraumatic.  Nose: Nose normal.  Eyes: Conjunctivae are normal. No scleral icterus.  Neck: No thyromegaly present.  Cardiovascular: Normal rate, regular rhythm and normal heart sounds.  Pulmonary/Chest: Effort normal and breath sounds normal.  Abdominal: Soft.  Musculoskeletal: He exhibits no edema.  Neurological: He is alert and oriented to person, place, and time.  Skin: Skin is warm and dry.  Psychiatric: He has a normal mood and affect. His behavior is normal. Judgment and thought content normal.        Assessment & Plan:     1. DDD (degenerative disc disease), lumbar  - cyclobenzaprine (FLEXERIL) 5 MG tablet; Take 1 tablet (5 mg total) by mouth 3 (three) times daily as needed for muscle spasms.  Dispense: 60  tablet; Refill: 2 - Ambulatory referral to Physical Therapy  2. Chronic low back pain, unspecified back pain laterality, with sciatica presence unspecified  - cyclobenzaprine (FLEXERIL) 5 MG tablet; Take 1 tablet (5 mg total) by mouth 3 (three) times daily as needed for muscle spasms.  Dispense: 60 tablet; Refill: 2 - Ambulatory referral to Physical Therapy 3.TIIDM 4.HTN  I have done the exam and reviewed the chart and it is accurate to the best of my knowledge. Development worker, community has been used and  any errors in dictation or transcription are unintentional. Miguel Aschoff M.D. [place], MD  [facility]

## 2018-05-09 ENCOUNTER — Ambulatory Visit: Payer: Medicare HMO | Attending: Family Medicine | Admitting: Physical Therapy

## 2018-05-09 ENCOUNTER — Encounter: Payer: Self-pay | Admitting: Physical Therapy

## 2018-05-09 DIAGNOSIS — M545 Low back pain, unspecified: Secondary | ICD-10-CM

## 2018-05-09 NOTE — Therapy (Signed)
[facility] [address], [place], [address] Phone: [phone]   Fax:  [phone]  Physical Therapy Evaluation  Patient Details  Name: Stephen White MRN: [id number] Date of Birth: May 19, 1936 Referring Provider: Rosanna Randy MD   Encounter Date: 05/09/2018  PT End of Session - 05/09/18 1327    Visit Number  1    Number of Visits  17    Date for PT Re-Evaluation  07/04/18    PT Start Time  0100    PT Stop Time  0200    PT Time Calculation (min)  60 min    Activity Tolerance  Patient tolerated treatment well    Behavior During Therapy  [facility] for tasks assessed/performed       Past Medical History:  Diagnosis Date  . Chronic kidney disease   . Diabetes mellitus without complication ([place])   . GERD (gastroesophageal reflux disease)   . Hyperlipidemia   . Hypertension   . Thyroid disease     Past Surgical History:  Procedure Laterality Date  . EYE SURGERY      There were no vitals filed for this visit.   Subjective Assessment - 05/09/18 1308    Subjective  LBP    Pertinent History  Patient is a 82 year old male that reports LBP that has progressed over the past 2 months with insidious onset. 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 Patient reports his pain is worse in the morning, and limits how far he can walk. Patient reports doing an exercise (described like a prone press up) that makes it easier for him to get out of bed. Patient reports his back pain is often only on the R side into the R hip, patient reports no radiation down the back of the LEs. Patient reports in the past week the worst pain he has had was 8/10; best 0/10    Limitations  Walking;House hold activities;Lifting    How long can you sit comfortably?  unlimited    How long can you stand comfortably?  80min    How long can you walk comfortably?  35min    Diagnostic tests  DG  lumbar spine Moderate to severe degenerative disc disease of the lower lumbar spine    Currently in Pain?  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    Multiple Pain Sites  No       ROM Lumbar flexion: slightly limited d/t hamstring tightness Lumbar ext wnl Lumbar rotation wnl bilat with slight pain in lower lumbar paraspinals with R rotation that increases with over pressure Lumbar side bending wnl bilat Hip motions wnl bilat with soft tissue end feel with bilat FADIR and 90/90, with FADIR reproducing back pain sx  Strength Hip flex: 5/5 bilat Hip abd L:5/5 R 4/+5  Hip Ext in prone: 4+/5 bilat Hip IR: 5/5 bilat Hip ER: 4+/5 bilat     Special Tests/ Other Reflexes 2+ bilat patella and achilles  (-) slump bilat (+) SLR (-) Crossed SLR (+) Elys on L for hip flexor tightness Patient reports no pain response with lumbar CPA/UPAs (+) 90/90 bilat for hamstring tightness (-) FABER bilat (+) FADIR bilat R>L for reproduction of back pain (-) Obers bilat (-) Scour bilat (-) SI cluster 6MWT: 1471ft 5xSTS 14sec  Gait Assessment: Patient ambulates with decreased trunk rotation and decreased arm swing   Ther-Ex -Seated hamstring stretch 4x  30sec holds (HEP) - Repeated standing ext x 10 (HEP) - Seated hamstring stretch 4x  30sec holds (HEP) -Education on PT role and there-ex role on pain and strengthening muscles surrounding the spine to increase stability and decrease pain  [facility] PT Assessment - 05/09/18 0001      Assessment    Medical Diagnosis  Lumbar DDD    Referring Provider  Rosanna Randy MD    Onset Date/Surgical Date  02/21/18    Hand Dominance  Right    Next MD Visit  Yes; unknown    Prior Therapy  no      Balance Screen   Has the patient fallen in the past 6 months  No    Has the patient had a decrease in activity level because of a fear of falling?   No    Is the patient reluctant to leave their home because of a fear of falling?   No      Home Environment   Living Environment  Private residence    Living Arrangements  Spouse/significant other    Available Help at Discharge  Family    Type of [place]  One level      Prior Function   Level of [place]  Retired    Leisure  -- [place]; running      Sensation   Light Touch  Appears Intact                      Objective measurements completed on examination: See above findings.              PT Education - 05/09/18 1328    Education provided  Yes    Education Details  Patient was educated on diagnosis, anatomy and pathology involved, prognosis, role of PT, and was given an HEP, demonstrating exercise with proper form following verbal and tactile cues, and was given a paper hand out to continue exercise at home. Pt was educated on and agreed to plan of care.    Person(s) Educated  Patient    Methods  Explanation;Demonstration;Tactile cues;Verbal cues;Handout    Comprehension  Verbal cues required;Returned demonstration;Verbalized understanding;Tactile cues required       PT Short Term Goals - 05/09/18 1427      PT SHORT TERM GOAL #1   Title  Pt will be independent with HEP in order to improve strength and balance in order to decrease fall risk and improve function at home and work.    Time  4    Period  Weeks    Status  New        PT Long Term Goals - 05/09/18 1425      PT LONG TERM GOAL #1   Title  Patient will  increase FOTO  score to 66 to demonstrate predicted increase in functional mobility to complete ADLs    Baseline  5/21: 55     Time  8    Period  Weeks    Status  New      PT LONG TERM GOAL #2   Title  Pt will decrease worst pain as reported on NPRS by at least 3 points in order to demonstrate clinically significant reduction in pain.    Baseline  Worst pain: 9/10    Time  8    Period  Weeks    Status  New      PT LONG TERM GOAL #3   Title  Pt will increase 6MWT by at least 30m (135ft); with normalized gait pattern in order to demonstrate clinically significant improvement in muscular endurance and community ambulation    Baseline  05/09/18: 1465ft with decreased trunk motion and arm swing    Time  8    Period  Weeks    Status  New             Plan - 05/09/18 1428    Clinical Impression Statement   Pt is a 82 year old male with R low back/hip pain. Current activity limitations in bending, prolonged walking, and squatting/kneeling at work. Impairments including painful trunk ROM, soft tissue tenderness at R lower lumbar paraspinals, glute weakness, low back/L flank pain with referral into L buttock region, and core weakness. 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 Pt will benefit from skilled PT intervention to address the aforementioned impairments and activity limitations for best return to PLOF.    History and Personal Factors relevant to plan of care:  HTN, lumbar DDD    Clinical Presentation  Evolving    Clinical Presentation due to:  2 personal factors/comorbidities, 3 body systems/activity limitations/participation restrictions     Rehab Potential  Good    Clinical Impairments Affecting Rehab Potential  (+) Motivation, active lifestyle (-) Caregiver burden, age, other comorbidities    PT Frequency  2x / week    PT Duration  8 weeks    PT Next Visit Plan  Squat assessment and review of proper mechanics, HEP and goal review    PT Home Exercise Plan  Hamstring and  modified childs pose stretch 30sec hold, repeated ext for when pain occurs x12    Consulted and Agree with Plan of Care  Patient       Patient will benefit from skilled therapeutic intervention in order to improve the following deficits and impairments:  Abnormal gait, Decreased endurance, Decreased mobility, Difficulty walking, Increased muscle spasms, Decreased range of motion, Improper body mechanics, Decreased activity tolerance, Decreased strength, Increased fascial restricitons, Postural dysfunction, Pain  Visit Diagnosis: Acute right-sided low back pain without sciatica     Problem List Patient Active Problem List   Diagnosis Date Noted  . Flatulence 11/03/2016  . Multiple actinic keratoses 05/05/2015  . Arthropathia 05/05/2015  . CAFL (chronic airflow limitation) ([place]) 05/05/2015  . 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 Gastro-esophageal reflux disease without esophagitis 05/05/2015  . Retinopathy 12/23/2014  . Long term current use of insulin ([place]) 12/23/2014  . Microalbuminuria 12/23/2014  . Type 2 diabetes mellitus treated with insulin ([place]) 05/29/2010  . Chronic kidney disease 05/07/2010  . Allergic rhinitis 05/14/2008  . Essential (primary) hypertension 10/18/2007  . HLD (hyperlipidemia) 09/06/2007   Shelton Silvas PT, DPT Shelton Silvas 05/09/2018, 2:34 PM  [facility] [address], [place], [address] Phone: [phone]   Fax:  [phone]  Name: LEDON WEIHE MRN: [id number] Date of Birth: 06-Aug-1936

## 2018-05-17 ENCOUNTER — Encounter: Payer: Self-pay | Admitting: Physical Therapy

## 2018-05-17 ENCOUNTER — Ambulatory Visit: Payer: Medicare HMO | Admitting: Physical Therapy

## 2018-05-17 DIAGNOSIS — M545 Low back pain, unspecified: Secondary | ICD-10-CM

## 2018-05-17 DIAGNOSIS — E119 Type 2 diabetes mellitus without complications: Secondary | ICD-10-CM | POA: Diagnosis not present

## 2018-05-17 NOTE — Therapy (Addendum)
[facility] [address], [place], [address] Phone: [phone]   Fax:  [phone]  Physical Therapy Treatment  Patient Details  Name: Stephen White MRN: [id number] Date of Birth: [birth year] Referring Provider: Rosanna Randy MD   Encounter Date: 05/17/2018  PT End of Session - 05/22/18 1342    Visit Number  3    Number of Visits  17    Date for PT Re-Evaluation  07/04/18    PT Start Time  0100    PT Stop Time  0145    PT Time Calculation (min)  45 min    Activity Tolerance  Patient tolerated treatment well    Behavior During Therapy  [facility] for tasks assessed/performed       Past Medical History:  Diagnosis Date  . Chronic kidney disease   . Diabetes mellitus without complication ([place])   . GERD (gastroesophageal reflux disease)   . Hyperlipidemia   . Hypertension   . Thyroid disease     Past Surgical History:  Procedure Laterality Date  . EYE SURGERY      There were no vitals filed for this visit.  Subjective Assessment - 05/22/18 1257    Subjective  Patient reports good pain relief the "couple days" following last session. Patient reports that he felt as though he had "good pain relief" from ESTIM + heat treatment. Patient reported his back pain "flared up" this morning, but reports little pain now.     Pertinent History  Patient is a 82 year old male that reports LBP that has progressed over the past 2 months with insidious onset. 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 Patient reports his pain is worse in the morning, and limits how far he can walk. Patient reports doing an exercise (described like a prone press up) that makes it easier for him to get out of bed. Patient reports his back pain is often only on the R side into the R hip, patient reports no radiation down the back of the LEs. Patient reports in the past week the worst pain he  has had was 8/10; best 0/10    Limitations  Walking;House hold activities;Lifting    How long can you sit comfortably?  unlimited    How long can you stand comfortably?  32min    How long can you walk comfortably?  65min    Diagnostic tests  DG lumbar spine Moderate to severe degenerative disc disease of the lower lumbar spine    Pain Onset  More than a month ago       Ther-Ex -Hamstring stretch 3x 30sec (HEP review) -Repeated ext (HEP review)  -Modified child's pose 30sec hold each side (HEP review)  -Prone hip ext 3x 07/28/09 on R with cuing for eccentric control, with muscle fatigue noted at the end of sets; patient able to complete 1x 10 on L with no difficulty for comparison -Sidelying abd 3x 10 with cuing for eccentric control and TC initially for proper form    ESTIM + heat pack [employer] ESTIM 13 min at patient tolerated 280V increased to 290V through treatment at R lumbar paraspinals . Attempted to decrease muscle spasm pain at this area. PT explained use, benefits, and precautions to treatment and patient consented to treatment. With PT assessing patient tolerance throughout (decreasing intensity as needed), monitoring skin integrity (normal), with decreased pain noted from patient. Utilized  this time to educate and demonstrate proper squat mechanics, as opposed to bending at the spine. Patient reports he tries to do this when he is working in his yard, but sometimes he feels as though it is hard to stand back up from this position- PT educated patient on the importance of glute strength for this                        PT Education - 05/22/18 1341    Education provided  Yes    Education Details  Exercise form; education on length/tension relationship between lumbar paraspinals and ant core musculature.     Person(s) Educated  Patient    Methods  Explanation;Demonstration;Verbal cues    Comprehension  Verbal cues required;Returned demonstration;Verbalized  understanding       PT Short Term Goals - 05/09/18 1427      PT SHORT TERM GOAL #1   Title  Pt will be independent with HEP in order to improve strength and balance in order to decrease fall risk and improve function at home and work.    Time  4    Period  Weeks    Status  New        PT Long Term Goals - 05/09/18 1425      PT LONG TERM GOAL #1   Title  Patient will increase FOTO score to 66 to demonstrate predicted increase in functional mobility to complete ADLs    Baseline  5/21: 55     Time  8    Period  Weeks    Status  New      PT LONG TERM GOAL #2   Title  Pt will decrease worst pain as reported on NPRS by at least 3 points in order to demonstrate clinically significant reduction in pain.    Baseline  Worst pain: 9/10    Time  8    Period  Weeks    Status  New      PT LONG TERM GOAL #3   Title  Pt will increase 6MWT by at least 49m (144ft); with normalized gait pattern in order to demonstrate clinically significant improvement in muscular endurance and community ambulation    Baseline  05/09/18: 1431ft with decreased trunk motion and arm swing    Time  8    Period  Weeks    Status  New              Patient will benefit from skilled therapeutic intervention in order to improve the following deficits and impairments:  Abnormal gait, Decreased endurance, Decreased mobility, Difficulty walking, Increased muscle spasms, Decreased range of motion, Improper body mechanics, Decreased activity tolerance, Decreased strength, Increased fascial restricitons, Postural dysfunction, Pain  Visit Diagnosis: Acute right-sided low back pain without sciatica     Problem List Patient Active Problem List   Diagnosis Date Noted  . Flatulence 11/03/2016  . Multiple actinic keratoses 05/05/2015  . Arthropathia 05/05/2015  . CAFL (chronic airflow limitation) ([place]) 05/05/2015  . 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 Gastro-esophageal reflux disease without esophagitis  05/05/2015  . Retinopathy 12/23/2014  . Long term current use of insulin ([place]) 12/23/2014  . Microalbuminuria 12/23/2014  . Type 2 diabetes mellitus treated with insulin ([place]) 05/29/2010  . Chronic kidney disease 05/07/2010  . Allergic rhinitis 05/14/2008  . Essential (primary) hypertension 10/18/2007  . HLD (hyperlipidemia) 09/06/2007   Shelton Silvas PT, DPT Shelton Silvas 05/22/2018, 1:44 PM  [facility]  [place] [address], [place], [address] Phone: [phone]   Fax:  [phone]  Name: Stephen White MRN: [id number] Date of Birth: July 12, 1936

## 2018-05-18 DIAGNOSIS — R69 Illness, unspecified: Secondary | ICD-10-CM | POA: Diagnosis not present

## 2018-05-19 ENCOUNTER — Ambulatory Visit: Payer: Medicare HMO | Admitting: Physical Therapy

## 2018-05-22 ENCOUNTER — Ambulatory Visit: Payer: Medicare HMO | Attending: Family Medicine | Admitting: Physical Therapy

## 2018-05-22 ENCOUNTER — Encounter: Payer: Self-pay | Admitting: Physical Therapy

## 2018-05-22 DIAGNOSIS — M545 Low back pain, unspecified: Secondary | ICD-10-CM

## 2018-05-22 NOTE — Therapy (Signed)
[facility] [address] [place], [address] Phone: [phone]   Fax:  [phone]  Physical Therapy Treatment  Patient Details  Name: Stephen White MRN: [id number] Date of Birth: April 17, 1936 Referring Provider: Rosanna Randy MD   Encounter Date: 05/22/2018  PT End of Session - 05/22/18 1342    Visit Number  3    Number of Visits  17    Date for PT Re-Evaluation  07/04/18    PT Start Time  0100    PT Stop Time  0145    PT Time Calculation (min)  45 min    Activity Tolerance  Patient tolerated treatment well    Behavior During Therapy  [facility] for tasks assessed/performed       Past Medical History:  Diagnosis Date  . Chronic kidney disease   . Diabetes mellitus without complication ([place])   . GERD (gastroesophageal reflux disease)   . Hyperlipidemia   . Hypertension   . Thyroid disease     Past Surgical History:  Procedure Laterality Date  . EYE SURGERY      There were no vitals filed for this visit.  Subjective Assessment - 05/22/18 1257    Subjective  Patient reports good pain relief the "couple days" following last session. Patient reports that he felt as though he had "good pain relief" from ESTIM + heat treatment. Patient reported his back pain "flared up" this morning, but reports little pain now.     Pertinent History  Patient is a 82 year old male that reports LBP that has progressed over the past 2 months with insidious onset. 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 Patient reports his pain is worse in the morning, and limits how far he can walk. Patient reports doing an exercise (described like a prone press up) that makes it easier for him to get out of bed. Patient reports his back pain is often only on the R side into the R hip, patient reports no radiation down the back of the LEs. Patient reports in the past week the worst pain he  has had was 8/10; best 0/10    Limitations  Walking;House hold activities;Lifting    How long can you sit comfortably?  unlimited    How long can you stand comfortably?  39min    How long can you walk comfortably?  26min    Diagnostic tests  DG lumbar spine Moderate to severe degenerative disc disease of the lower lumbar spine    Pain Onset  More than a month ago       Manual -R lumbar paraspinal STM with trigger point release  -L2-S3 Grade I-II CPA mobs 30sec bouts; 4 bouts each segment for pain modulation - Manual lumbar traction 10sec traction/10sec relax x10  -R hamstring contract relax 10sec contract/10sec relax x3 (increasing ROM each time)   Ther-Ex -Bridge with red tband at knees 3x 10 with cuing for eccentric control  -Posterior pelvic tilts 3x 10 with 3sec holds with max TC and VC needed for proper core contraction without glute activation and proper breath control -Prone hip ext 3x 10 on R with 90d knee bend to reduce lever arm to increase pt success, with min cuing needed for proper glute activation    ESTIM+ heat packHiVolt ESTIM13 min at patient tolerated240Vincreased to255V through treatmentat R lumbar paraspinals. Attempted to d/t success of previous treatment. With  PT assessing patient tolerance throughout (decreasing intensity as needed), monitoring skin integrity (normal), with decreased pain noted from patient. Utilized this time to re-encourage HEP, with further education on restoring length/tension relationship, to strengthen hip ext and core to reduce lumbar paraspinal strain with heavy activities.                           PT Education - 05/22/18 1341    Education provided  Yes    Education Details  Exercise form; education on length/tension relationship between lumbar paraspinals and ant core musculature.     Person(s) Educated  Patient    Methods  Explanation;Demonstration;Verbal cues    Comprehension  Verbal cues  required;Returned demonstration;Verbalized understanding       PT Short Term Goals - 05/09/18 1427      PT SHORT TERM GOAL #1   Title  Pt will be independent with HEP in order to improve strength and balance in order to decrease fall risk and improve function at home and work.    Time  4    Period  Weeks    Status  New        PT Long Term Goals - 05/09/18 1425      PT LONG TERM GOAL #1   Title  Patient will increase FOTO score to 66 to demonstrate predicted increase in functional mobility to complete ADLs    Baseline  5/21: 55     Time  8    Period  Weeks    Status  New      PT LONG TERM GOAL #2   Title  Pt will decrease worst pain as reported on NPRS by at least 3 points in order to demonstrate clinically significant reduction in pain.    Baseline  Worst pain: 9/10    Time  8    Period  Weeks    Status  New      PT LONG TERM GOAL #3   Title  Pt will increase 6MWT by at least 64m (169ft); with normalized gait pattern in order to demonstrate clinically significant improvement in muscular endurance and community ambulation    Baseline  05/09/18: 1473ft with decreased trunk motion and arm swing    Time  8    Period  Weeks    Status  New            Plan - 05/22/18 1344    Clinical Impression Statement   PT continued to lead patient through hip ext strenghtening, which patient is able to complete with min cuing needed from PT. PT initiated core strengthening to begin to restore length/tension relationship, educating patient on the importance of strengthening core and stretching/relieving tension in over-active lumbar paraspinals. PT utilized visual aid to ensure understanding from patient. Following session patient reports no pain.    Clinical Impairments Affecting Rehab Potential  (+) Motivation, active lifestyle (-) Caregiver burden, age, other comorbidities    PT Frequency  2x / week    PT Duration  8 weeks    PT Treatment/Interventions  Gait training;Moist  Heat;Iontophoresis 4mg /ml Dexamethasone;Stair training;Functional mobility training;Traction;Ultrasound;Cryotherapy;Electrical Stimulation;Therapeutic exercise;Therapeutic activities;Patient/family education;Manual techniques;Dry needling;Energy conservation;Taping;Passive range of motion;Neuromuscular re-education;Balance training    PT Next Visit Plan  Glute/core strengthening as tolerated, manual + modalities for soft tissue restriciton (add mobilization to increase lumbar rotation)    PT Home Exercise Plan  Hamstring and modified childs pose stretch 30sec hold, repeated ext for when pain occurs  x12    Consulted and Agree with Plan of Care  Patient       Patient will benefit from skilled therapeutic intervention in order to improve the following deficits and impairments:  Abnormal gait, Decreased endurance, Decreased mobility, Difficulty walking, Increased muscle spasms, Decreased range of motion, Improper body mechanics, Decreased activity tolerance, Decreased strength, Increased fascial restricitons, Postural dysfunction, Pain  Visit Diagnosis: Acute right-sided low back pain without sciatica     Problem List Patient Active Problem List   Diagnosis Date Noted  . Flatulence 11/03/2016  . Multiple actinic keratoses 05/05/2015  . Arthropathia 05/05/2015  . CAFL (chronic airflow limitation) ([place]) 05/05/2015  . 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 Gastro-esophageal reflux disease without esophagitis 05/05/2015  . Retinopathy 12/23/2014  . Long term current use of insulin ([place]) 12/23/2014  . Microalbuminuria 12/23/2014  . Type 2 diabetes mellitus treated with insulin ([place]) 05/29/2010  . Chronic kidney disease 05/07/2010  . Allergic rhinitis 05/14/2008  . Essential (primary) hypertension 10/18/2007  . HLD (hyperlipidemia) 09/06/2007   Shelton Silvas PT, DPT  Shelton Silvas 05/22/2018, 1:56 PM  [facility] [place] [address], [place], [address] Phone: [phone]   Fax:  [phone]  Name: MONTEY EBEL MRN: [id number] Date of Birth: [birth year]

## 2018-05-23 ENCOUNTER — Ambulatory Visit: Payer: Self-pay | Admitting: Family Medicine

## 2018-05-24 ENCOUNTER — Ambulatory Visit: Payer: Medicare HMO | Admitting: Physical Therapy

## 2018-05-29 ENCOUNTER — Encounter: Payer: Self-pay | Admitting: Physical Therapy

## 2018-05-29 ENCOUNTER — Ambulatory Visit: Payer: Medicare HMO | Admitting: Physical Therapy

## 2018-05-29 DIAGNOSIS — M545 Low back pain, unspecified: Secondary | ICD-10-CM

## 2018-05-29 NOTE — Therapy (Signed)
[facility] [address], [place], [address] Phone: [phone]   Fax:  [phone]  Physical Therapy Treatment  Patient Details  Name: RODELL MARRS MRN: [id number] Date of Birth: [birth year] Referring Provider: Rosanna Randy MD   Encounter Date: 05/29/2018  PT End of Session - 05/29/18 1525    Visit Number  4    Number of Visits  17    Date for PT Re-Evaluation  07/04/18    PT Start Time  0315    PT Stop Time  0400    PT Time Calculation (min)  45 min    Activity Tolerance  Patient tolerated treatment well    Behavior During Therapy  [facility] for tasks assessed/performed       Past Medical History:  Diagnosis Date  . Chronic kidney disease   . Diabetes mellitus without complication ([place])   . GERD (gastroesophageal reflux disease)   . Hyperlipidemia   . Hypertension   . Thyroid disease     Past Surgical History:  Procedure Laterality Date  . EYE SURGERY      There were no vitals filed for this visit.  Subjective Assessment - 05/29/18 1521    Subjective  Patient reports following last session he had no pain for a couple days, and reports he did not have to take any pain medication or wear his back brace for a few days. Patient reports his pain came back on this morning and is a 2/10 right now. Reports compliance with his HEP with no questions or concerns.     Pertinent History  Patient is a 82 year old male that reports LBP that has progressed over the past 2 months with insidious onset. 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 Patient reports his pain is worse in the morning, and limits how far he can walk. Patient reports doing an exercise (described like a prone press up) that makes it easier for him to get out of bed. Patient reports his back pain is often only on the R side into the R hip, patient reports no radiation down the back of the LEs.  Patient reports in the past week the worst pain he has had was 8/10; best 0/10    Limitations  Walking;House hold activities;Lifting    How long can you sit comfortably?  unlimited    How long can you stand comfortably?  67min    How long can you walk comfortably?  61min    Diagnostic tests  DG lumbar spine Moderate to severe degenerative disc disease of the lower lumbar spine    Pain Onset  More than a month ago          Manual -R lumbar paraspinal STM with trigger point release  -L2-S3 Grade I-II CPA mobs 30sec bouts; 4 bouts each segment for pain modulation  -R hamstring contract relax 10sec contract/10sec relax x3 (increasing ROM each time)   Ther-Ex -Bridge with red tband at knees 3x 12 with cuing for eccentric control  -Posterior pelvic tilts x15 with 3sec holds with mod TC and VC needed for proper core contraction without glute activation and proper breath control (attemted TA marches initially, but needed to return to post pelvic tilt for proper TA activation.  -TA marches 3x 10 (5each LE lift) with max TC and VC to maintain core activation/post pelvic tilt throughout -Lower lumbar rotations in hooklying x20  ESTIM+ heat packHiVolt ESTIM3min at patient tolerated210Vincreased to235V through treatmentatR lumbar paraspinals. Attemptedto d/t success of previous treatment.With PT assessing patient tolerance throughout (decreasing intensity as needed), monitoring skin integrity (normal), with decreased pain noted from patient. Utilized this time to re-encourage HEP                       PT Education - 05/29/18 1524    Education provided  Yes    Education Details  Exercise form; HEP review and encouragement    Person(s) Educated  Patient    Methods  Explanation;Tactile cues;Verbal cues    Comprehension  Verbalized understanding;Verbal cues required;Tactile cues required       PT Short Term Goals - 05/09/18 1427      PT SHORT TERM GOAL #1    Title  Pt will be independent with HEP in order to improve strength and balance in order to decrease fall risk and improve function at home and work.    Time  4    Period  Weeks    Status  New        PT Long Term Goals - 05/09/18 1425      PT LONG TERM GOAL #1   Title  Patient will increase FOTO score to 66 to demonstrate predicted increase in functional mobility to complete ADLs    Baseline  5/21: 55     Time  8    Period  Weeks    Status  New      PT LONG TERM GOAL #2   Title  Pt will decrease worst pain as reported on NPRS by at least 3 points in order to demonstrate clinically significant reduction in pain.    Baseline  Worst pain: 9/10    Time  8    Period  Weeks    Status  New      PT LONG TERM GOAL #3   Title  Pt will increase 6MWT by at least 53m (175ft); with normalized gait pattern in order to demonstrate clinically significant improvement in muscular endurance and community ambulation    Baseline  05/09/18: 1459ft with decreased trunk motion and arm swing    Time  8    Period  Weeks    Status  New            Plan - 05/29/18 1549    Clinical Impression Statement  Patient is continuing to respond well to [facility] + modality treatment with no pain reported following. PT was able to led patient through therex with increased core activation demand, which patient was able to complete with accuracy following max cuing from PT. Pt will continued to benefit from skilled PT to increase core activation, with functional movements, and pain modulation.     Rehab Potential  Good    Clinical Impairments Affecting Rehab Potential  (+) Motivation, active lifestyle (-) Caregiver burden, age, other comorbidities    PT Frequency  2x / week    PT Duration  8 weeks    PT Treatment/Interventions  Gait training;Moist Heat;Iontophoresis 4mg /ml Dexamethasone;Stair training;Functional mobility training;Traction;Ultrasound;Cryotherapy;Electrical Stimulation;Therapeutic exercise;Therapeutic  activities;Patient/family education;Manual techniques;Dry needling;Energy conservation;Taping;Passive range of motion;Neuromuscular re-education;Balance training    PT Next Visit Plan  Glute/core strengthening as tolerated, manual + modalities for soft tissue restriciton (add mobilization to increase lumbar rotation)    PT Home Exercise Plan  Hamstring and modified childs pose stretch 30sec hold, repeated ext for when pain occurs x12    Consulted and Agree  with Plan of Care  Patient       Patient will benefit from skilled therapeutic intervention in order to improve the following deficits and impairments:  Abnormal gait, Decreased endurance, Decreased mobility, Difficulty walking, Increased muscle spasms, Decreased range of motion, Improper body mechanics, Decreased activity tolerance, Decreased strength, Increased fascial restricitons, Postural dysfunction, Pain  Visit Diagnosis: Acute right-sided low back pain without sciatica     Problem List Patient Active Problem List   Diagnosis Date Noted  . Flatulence 11/03/2016  . Multiple actinic keratoses 05/05/2015  . Arthropathia 05/05/2015  . CAFL (chronic airflow limitation) ([place]) 05/05/2015  . 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 Gastro-esophageal reflux disease without esophagitis 05/05/2015  . Retinopathy 12/23/2014  . Long term current use of insulin ([place]) 12/23/2014  . Microalbuminuria 12/23/2014  . Type 2 diabetes mellitus treated with insulin ([place]) 05/29/2010  . Chronic kidney disease 05/07/2010  . Allergic rhinitis 05/14/2008  . Essential (primary) hypertension 10/18/2007  . HLD (hyperlipidemia) 09/06/2007   Shelton Silvas PT, DPT Shelton Silvas 05/29/2018, 3:58 PM  [facility] [address] [place], [address] Phone: [phone]   Fax:  [phone]  Name: JAQUAVIAN FIRKUS MRN: [id number] Date of Birth: 02-07-1936

## 2018-05-31 ENCOUNTER — Ambulatory Visit: Payer: Medicare HMO | Admitting: Physical Therapy

## 2018-05-31 ENCOUNTER — Encounter: Payer: Self-pay | Admitting: Physical Therapy

## 2018-05-31 DIAGNOSIS — M545 Low back pain, unspecified: Secondary | ICD-10-CM

## 2018-05-31 NOTE — Therapy (Signed)
[facility] [address], [place], [address] Phone: [phone]   Fax:  [phone]  Physical Therapy Treatment  Patient Details  Name: Stephen White MRN: [id number] Date of Birth: Sep 16, 1936 Referring Provider: Rosanna Randy MD   Encounter Date: 05/31/2018  PT End of Session - 05/31/18 1010    Visit Number  5    Number of Visits  17    Date for PT Re-Evaluation  07/04/18    PT Start Time  0945    PT Stop Time  1030    PT Time Calculation (min)  45 min    Activity Tolerance  Patient tolerated treatment well    Behavior During Therapy  [facility] for tasks assessed/performed       Past Medical History:  Diagnosis Date  . Chronic kidney disease   . Diabetes mellitus without complication ([place])   . GERD (gastroesophageal reflux disease)   . Hyperlipidemia   . Hypertension   . Thyroid disease     Past Surgical History:  Procedure Laterality Date  . EYE SURGERY      There were no vitals filed for this visit.  Subjective Assessment - 05/31/18 0942    Subjective  Patient reports minimal pain following last session. He reports yesterday he had some pain while he was walking around "in town" but was able to put on his lumbar brace support, which alleviated  his pain. Patient reports minimal pain today, with 8/10 pain yesterday with prolonged walking. Patient reports compliance with his HEP with no questions or concerns    Pertinent History  Patient is a 82 year old male that reports LBP that has progressed over the past 2 months with insidious onset. 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 Patient reports his pain is worse in the morning, and limits how far he can walk. Patient reports doing an exercise (described like a prone press up) that makes it easier for him to get out of bed. Patient reports his back pain is often only on the R side into the R hip,  patient reports no radiation down the back of the LEs. Patient reports in the past week the worst pain he has had was 8/10; best 0/10    Limitations  Walking;House hold activities;Lifting    How long can you sit comfortably?  unlimited    How long can you stand comfortably?  50min    How long can you walk comfortably?  42min    Diagnostic tests  DG lumbar spine Moderate to severe degenerative disc disease of the lower lumbar spine    Pain Onset  More than a month ago           Manual -R lumbar paraspinal STM withtrigger point release -L2-S3 Grade I-II CPA mobs 30sec bouts; 4 bouts each segment for pain modulation -L2-5 UPA grade III-IV 30sec bouts 4 bouts each segment, each side for increased trunk rotation  -R hamstring contract relax 10sec contract/10sec relax x3 (increasing ROM each time) / 2x on L with patient reporting this was not as difficult   Ther-Ex  -TA marches 2x 10 (5each LE lift) with min TC and VC to maintain core activation/post pelvic tilt throughout, with patient demonstrating good carry over from previous session -TA pull downs using lat pull down bar 3x 10 20# with min cuing for proper posture -Palloff press tband 3x 10 with visual  target for proper UE "pushing alignment" without trunk rotation compensation for proper oblique contraction  -Lower lumbar rotations in hooklying x20   ESTIM+ heat packHiVolt ESTIM2min at patient tolerated205Vincreased to215V through treatmentatRlumbar paraspinalsand 210V at L paraspinals. Attemptedtod/tsuccess of previous treatment, and to decrease muscle tension following increased therex progression.With PT assessing patient tolerance throughout (decreasing intensity as needed), monitoring skin integrity (normal), with decreased pain noted from patient. Utilized this timeto educate patient on strengthening core musculature to use this as an "intrinsic back brace" which patient verbalized understanding of.                          PT Education - 05/31/18 1010    Education provided  Yes    Education Details  Exercise form    Person(s) Educated  Patient    Methods  Explanation;Demonstration;Tactile cues;Verbal cues    Comprehension  Verbal cues required;Returned demonstration;Verbalized understanding;Tactile cues required       PT Short Term Goals - 05/09/18 1427      PT SHORT TERM GOAL #1   Title  Pt will be independent with HEP in order to improve strength and balance in order to decrease fall risk and improve function at home and work.    Time  4    Period  Weeks    Status  New        PT Long Term Goals - 05/09/18 1425      PT LONG TERM GOAL #1   Title  Patient will increase FOTO score to 66 to demonstrate predicted increase in functional mobility to complete ADLs    Baseline  5/21: 55     Time  8    Period  Weeks    Status  New      PT LONG TERM GOAL #2   Title  Pt will decrease worst pain as reported on NPRS by at least 3 points in order to demonstrate clinically significant reduction in pain.    Baseline  Worst pain: 9/10    Time  8    Period  Weeks    Status  New      PT LONG TERM GOAL #3   Title  Pt will increase 6MWT by at least 57m (126ft); with normalized gait pattern in order to demonstrate clinically significant improvement in muscular endurance and community ambulation    Baseline  05/09/18: 1414ft with decreased trunk motion and arm swing    Time  8    Period  Weeks    Status  New            Plan - 05/31/18 1044    Clinical Impression Statement  PT led patient through therex progression for carry over of core stability in WB positions, with no pain, only fatigue noted, requiring min cuing for proper form. Patient is continuing to respond well to [facility] + modality treatment with more normalized trunk rotation in gait following and "tension relief" noted from patient. Pt  is reporting pain relief when using his back brace, and PT  educated him on the importance of neutral spine posture to reduce lumbar strain/pain, and the goal of strengthening core musculature (intrinsic back brace) to correct posture deficits.     Rehab Potential  Good    Clinical Impairments Affecting Rehab Potential  (+) Motivation, active lifestyle (-) Caregiver burden, age, other comorbidities    PT Frequency  2x / week    PT Duration  8 weeks  PT Treatment/Interventions  Gait training;Moist Heat;Iontophoresis 4mg /ml Dexamethasone;Stair training;Functional mobility training;Traction;Ultrasound;Cryotherapy;Electrical Stimulation;Therapeutic exercise;Therapeutic activities;Patient/family education;Manual techniques;Dry needling;Energy conservation;Taping;Passive range of motion;Neuromuscular re-education;Balance training    PT Next Visit Plan  Glute/core strengthening as tolerated, manual + modalities for soft tissue restriciton (add mobilization to increase lumbar rotation)    PT Home Exercise Plan  Hamstring and modified childs pose stretch 30sec hold, repeated ext for when pain occurs x12    Consulted and Agree with Plan of Care  Patient       Patient will benefit from skilled therapeutic intervention in order to improve the following deficits and impairments:  Abnormal gait, Decreased endurance, Decreased mobility, Difficulty walking, Increased muscle spasms, Decreased range of motion, Improper body mechanics, Decreased activity tolerance, Decreased strength, Increased fascial restricitons, Postural dysfunction, Pain  Visit Diagnosis: Acute right-sided low back pain without sciatica     Problem List Patient Active Problem List   Diagnosis Date Noted  . Flatulence 11/03/2016  . Multiple actinic keratoses 05/05/2015  . Arthropathia 05/05/2015  . CAFL (chronic airflow limitation) ([place]) 05/05/2015  . 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 Gastro-esophageal reflux disease without esophagitis 05/05/2015  . Retinopathy 12/23/2014  . Long  term current use of insulin ([place]) 12/23/2014  . Microalbuminuria 12/23/2014  . Type 2 diabetes mellitus treated with insulin ([place]) 05/29/2010  . Chronic kidney disease 05/07/2010  . Allergic rhinitis 05/14/2008  . Essential (primary) hypertension 10/18/2007  . HLD (hyperlipidemia) 09/06/2007   Shelton Silvas PT, DPT Shelton Silvas 05/31/2018, 11:16 AM  [place] [facility] [address] [place], [address] Phone: [phone]   Fax:  [phone]  Name: Stephen White MRN: [id number] Date of Birth: [birth year]

## 2018-06-06 ENCOUNTER — Ambulatory Visit: Payer: Medicare HMO | Admitting: Physical Therapy

## 2018-06-08 ENCOUNTER — Ambulatory Visit (INDEPENDENT_AMBULATORY_CARE_PROVIDER_SITE_OTHER): Payer: Medicare HMO | Admitting: Family Medicine

## 2018-06-08 ENCOUNTER — Ambulatory Visit (INDEPENDENT_AMBULATORY_CARE_PROVIDER_SITE_OTHER): Payer: Medicare HMO

## 2018-06-08 ENCOUNTER — Ambulatory Visit: Payer: Medicare HMO | Admitting: Physical Therapy

## 2018-06-08 VITALS — BP 136/58 | HR 68 | Temp 98.3°F | Ht 68.0 in | Wt 196.8 lb

## 2018-06-08 DIAGNOSIS — Z0000 Encounter for general adult medical examination without abnormal findings: Secondary | ICD-10-CM | POA: Diagnosis not present

## 2018-06-08 NOTE — Progress Notes (Signed)
Subjective:   Stephen White is a 82 y.o. male who presents for Medicare Annual/Subsequent preventive examination.  Review of Systems:  N/A  Cardiac Risk Factors include: advanced age (>75men, >6 women);diabetes mellitus;dyslipidemia;hypertension;male gender     Objective:    Vitals: BP (!) 136/58 (BP Location: Right Arm)   Pulse 68   Temp 98.3 F (36.8 C) (Oral)   Ht 5\' 8"  (1.727 m)   Wt 196 lb 12.8 oz (89.3 kg)   BMI 29.92 kg/m   Body mass index is 29.92 kg/m.  Advanced Directives 06/08/2018 05/09/2018 06/07/2017 10/03/2015 07/02/2015  Does Patient Have a Medical Advance Directive? Yes Yes Yes Yes Yes  Type of Paramedic of [place];Living will [place];Living will Living will [place];Living will Living will  Does patient want to make changes to medical advance directive? - Yes (Inpatient - patient defers changing a medical advance directive at this time) - - -  Copy of [place] in Chart? No - copy requested No - copy requested - No - copy requested -    Tobacco Social History   Tobacco Use  Smoking Status Former Smoker  . Packs/day: 0.50  . Years: 15.00  . Pack years: 7.50  . Types: Cigarettes  . Last attempt to quit: 12/19/1978  . Years since quitting: 39.4  Smokeless Tobacco Never Used     Counseling given: Not Answered   Clinical Intake:  Pre-visit preparation completed: Yes  Pain : No/denies pain Pain Score: 0-No pain     Nutritional Status: BMI 25 -29 Overweight Nutritional Risks: None Diabetes: Yes(type 2) CBG done?: No Did pt. bring in CBG monitor from home?: No  How often do you need to have someone help you when you read instructions, pamphlets, or other written materials from your doctor or pharmacy?: 1 - Never  Interpreter Needed?: No  Information entered by :: [facility], LPN  Past Medical History:  Diagnosis Date  . Chronic kidney disease   .  Diabetes mellitus without complication ([place])   . GERD (gastroesophageal reflux disease)   . Hyperlipidemia   . Hypertension   . Thyroid disease    Past Surgical History:  Procedure Laterality Date  . EYE SURGERY     Family History  Problem Relation Age of Onset  . Diabetes Mother   . Diabetes Father   . Diabetes Brother   . Diabetes Sister   . Diabetes Sister   . Diabetes Brother   . Diabetes Brother   . Diabetes Brother   . Cancer Brother   . Diabetes Brother    Social History   Socioeconomic History  . Marital status: Married    Spouse name: Not on file  . Number of children: 1  . Years of education: Not on file  . Highest education level: 12th grade  Occupational History  . Occupation: retired  Scientific laboratory technician  . Financial resource strain: Not hard at all  . Food insecurity:    Worry: Never true    Inability: Never true  . Transportation needs:    Medical: No    Non-medical: No  Tobacco Use  . Smoking status: Former Smoker    Packs/day: 0.50    Years: 15.00    Pack years: 7.50    Types: Cigarettes    Last attempt to quit: 12/19/1978    Years since quitting: 39.4  . Smokeless tobacco: Never Used  Substance and Sexual Activity  . Alcohol  use: No    Alcohol/week: 0.0 oz  . Drug use: No  . Sexual activity: Not on file  Lifestyle  . Physical activity:    Days per week: Not on file    Minutes per session: Not on file  . Stress: Not at all  Relationships  . Social connections:    Talks on phone: Not on file    Gets together: Not on file    Attends religious service: Not on file    Active member of club or organization: Not on file    Attends meetings of clubs or organizations: Not on file    Relationship status: Not on file  Other Topics Concern  . Not on file  Social History Narrative  . Not on file    Outpatient Encounter Medications as of 06/08/2018  Medication Sig  . amLODipine (NORVASC) 5 MG tablet Take 5 mg by mouth daily.   Marland Kitchen aspirin 81 MG  tablet Take 81 mg by mouth daily.   . hydrochlorothiazide (HYDRODIURIL) 25 MG tablet Take 25 mg by mouth daily.  Marland Kitchen ibuprofen (ADVIL,MOTRIN) 600 MG tablet TAKE 1 TABLET(600 MG) BY MOUTH THREE TIMES DAILY AS NEEDED  . insulin NPH Human (HUMULIN N,NOVOLIN N) 100 UNIT/ML injection Inject 15 Units into the skin daily before breakfast.   . insulin regular (NOVOLIN R,HUMULIN R) 250 units/2.80mL (100 units/mL) injection Inject 8 units at breakfast, and 15 units at lunch and 15 units at supper  . losartan (COZAAR) 100 MG tablet Take 1 tablet (100 mg total) daily by mouth.  . ONE TOUCH ULTRA TEST test strip   . pravastatin (PRAVACHOL) 10 MG tablet TAKE ONE (1) TABLET BY MOUTH EVERY DAY  . cyclobenzaprine (FLEXERIL) 5 MG tablet Take 1 tablet (5 mg total) by mouth 3 (three) times daily as needed for muscle spasms. (Patient not taking: Reported on 06/08/2018)  . MULTIPLE VITAMIN PO Take by mouth daily.   Marland Kitchen omeprazole (PRILOSEC) 20 MG capsule Take 20 mg by mouth daily.  . ranitidine (ZANTAC) 150 MG tablet Take 1 tablet (150 mg total) 2 (two) times daily by mouth. (Patient not taking: Reported on 06/08/2018)   No facility-administered encounter medications on file as of 06/08/2018.     Activities of Daily Living In your present state of health, do you have any difficulty performing the following activities: 06/08/2018  Hearing? Y  Comment Wears bilateral hearing aids.  Vision? Y  Comment Up close vision.   Difficulty concentrating or making decisions? N  Walking or climbing stairs? N  Dressing or bathing? N  Doing errands, shopping? N  Preparing Food and eating ? N  Using the Toilet? N  In the past six months, have you accidently leaked urine? N  Do you have problems with loss of bowel control? N  Managing your Medications? N  Managing your Finances? N  Housekeeping or managing your Housekeeping? N  Some recent data might be hidden    Patient Care Team: Jerrol Banana., MD as PCP - General  (Family Medicine) Eulogio Bear, MD as Consulting Physician (Ophthalmology) Gabriel Carina Betsey Holiday, MD as Physician Assistant (Endocrinology) Ralene Bathe, MD as Consulting Physician (Dermatology)   Assessment:   This is a routine wellness examination for [employer].  Exercise Activities and Dietary recommendations Current Exercise Habits: The patient does not participate in regular exercise at present(Does yardwork. ), Exercise limited by: None identified  Goals    . DIET - INCREASE WATER INTAKE  Recommend increasing water intake to 6-8 glasses a day.        Fall Risk Fall Risk  06/08/2018 06/07/2017 12/06/2016 07/02/2015  Falls in the past year? No No No No   Is the patient's home free of loose throw rugs in walkways, pet beds, electrical cords, etc?   yes      Grab bars in the bathroom? yes      Handrails on the stairs?   no      Adequate lighting?   yes  Timed Get Up and Go Performed: N/A  Depression Screen PHQ 2/9 Scores 06/08/2018 06/07/2017 12/06/2016 07/02/2015  PHQ - 2 Score 0 0 0 0    Cognitive Function     6CIT Screen 06/08/2018 06/07/2017  What Year? 4 points 0 points  What month? 0 points 0 points  What time? 0 points 0 points  Count back from 20 0 points 0 points  Months in reverse 2 points 4 points  Repeat phrase 0 points 0 points  Total Score 6 4    Immunization History  Administered Date(s) Administered  . Influenza, High Dose Seasonal PF 10/03/2015, 11/03/2016, 10/06/2017  . Pneumococcal Conjugate-13 12/24/2014  . Pneumococcal Polysaccharide-23 12/25/2012  . Td 07/02/2015  . Zoster 12/25/2012    Qualifies for Shingles Vaccine? Due for Shingles vaccine. Declined my offer to administer today. Education has been provided regarding the importance of this vaccine. Pt has been advised to call her insurance company to determine her out of pocket expense. Advised she may also receive this vaccine at her local pharmacy or Health Dept. Verbalized acceptance and  understanding.  Screening Tests Health Maintenance  Topic Date Due  . HEMOGLOBIN A1C  03/06/2018  . INFLUENZA VACCINE  07/20/2018  . OPHTHALMOLOGY EXAM  03/14/2019  . FOOT EXAM  04/05/2019  . TETANUS/TDAP  07/01/2025  . PNA vac Low Risk Adult  Completed   Cancer Screenings: Lung: Low Dose CT Chest recommended if Age 38-80 years, 30 pack-year currently smoking OR have quit w/in 15years. Patient does not qualify. Colorectal: Up to date  Additional Screenings:  Hepatitis C Screening: N/A      Plan:  I have personally reviewed and addressed the Medicare Annual Wellness questionnaire and have noted the following in the patient's chart:  A. Medical and social history B. Use of alcohol, tobacco or illicit drugs  C. Current medications and supplements D. Functional ability and status E.  Nutritional status F.  Physical activity G. Advance directives H. List of other physicians I.  Hospitalizations, surgeries, and ER visits in previous 12 months J.  [place] such as hearing and vision if needed, cognitive and depression L. Referrals and appointments - none  In addition, I have reviewed and discussed with patient certain preventive protocols, quality metrics, and best practice recommendations. A written personalized care plan for preventive services as well as general preventive health recommendations were provided to patient.  See attached scanned questionnaire for additional information.   Signed,  Fabio Neighbors, LPN Nurse Health Advisor   Nurse Recommendations: Pt needs his Hgb A1c checked today.

## 2018-06-08 NOTE — Progress Notes (Signed)
Patient: Stephen White, Male    DOB: [birth year], 82 y.o.   MRN: [id number] Visit Date: 06/08/2018  Today's Provider: Wilhemena Durie, MD   Chief Complaint  Patient presents with  . Annual Wellness Exam   Subjective:    Annual wellness visit Stephen White is a 82 y.o. male. He feels well. He reports exercising not regul. He reports he is sleeping well. 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  Colonoscopy- 02/26/2009. Hyperplastic polyps.     Review of Systems  Constitutional: Negative.   HENT: Negative.   Eyes: Negative.   Respiratory: Negative.   Cardiovascular: Negative.   Gastrointestinal: Negative.   Endocrine: Negative.   Genitourinary: Negative.   Musculoskeletal: Negative.   Skin: Negative.   Allergic/Immunologic: Negative.   Neurological: Negative.   Hematological: Negative.   Psychiatric/Behavioral: Negative.     Social History   Socioeconomic History  . Marital status: Married    Spouse name: Not on file  . Number of children: 1  . Years of education: Not on file  . Highest education level: 12th grade  Occupational History  . Occupation: retired  Scientific laboratory technician  . Financial resource strain: Not hard at all  . Food insecurity:    Worry: Never true    Inability: Never true  . Transportation needs:    Medical: No    Non-medical: No  Tobacco Use  . Smoking status: Former Smoker    Packs/day: 0.50    Years: 15.00    Pack years: 7.50    Types: Cigarettes    Last attempt to quit: 12/19/1978    Years since quitting: 39.4  . Smokeless tobacco: Never Used  Substance and Sexual Activity  . Alcohol use: No    Alcohol/week: 0.0 oz  . Drug use: No  . Sexual activity: Not on file  Lifestyle  . Physical activity:    Days per week: Not on file    Minutes per session: Not on file  . Stress: Not at all  Relationships  . Social connections:    Talks on phone: Not on file    Gets together: Not on file    Attends  religious service: Not on file    Active member of club or organization: Not on file    Attends meetings of clubs or organizations: Not on file    Relationship status: Not on file  . Intimate partner violence:    Fear of current or ex partner: Not on file    Emotionally abused: Not on file    Physically abused: Not on file    Forced sexual activity: Not on file  Other Topics Concern  . Not on file  Social History Narrative  . Not on file    Past Medical History:  Diagnosis Date  . Chronic kidney disease   . Diabetes mellitus without complication ([place])   . GERD (gastroesophageal reflux disease)   . Hyperlipidemia   . Hypertension   . Thyroid disease      Patient Active Problem List   Diagnosis Date Noted  . Flatulence 11/03/2016  . Multiple actinic keratoses 05/05/2015  . Arthropathia 05/05/2015  . CAFL (chronic airflow limitation) ([place]) 05/05/2015  . 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 Gastro-esophageal reflux disease without esophagitis 05/05/2015  . Retinopathy 12/23/2014  . Long term current use of insulin ([place]) 12/23/2014  . Microalbuminuria 12/23/2014  . Type 2 diabetes mellitus treated with insulin (  [place]) 05/29/2010  . Chronic kidney disease 05/07/2010  . Allergic rhinitis 05/14/2008  . Essential (primary) hypertension 10/18/2007  . HLD (hyperlipidemia) 09/06/2007    Past Surgical History:  Procedure Laterality Date  . EYE SURGERY      His family history includes Cancer in his brother; Diabetes in his brother, brother, brother, brother, brother, father, mother, sister, and sister.      Current Outpatient Medications:  .  amLODipine (NORVASC) 5 MG tablet, Take 5 mg by mouth daily. , Disp: , Rfl:  .  aspirin 81 MG tablet, Take 81 mg by mouth daily. , Disp: , Rfl:  .  cyclobenzaprine (FLEXERIL) 5 MG tablet, Take 1 tablet (5 mg total) by mouth 3 (three) times daily as needed for muscle spasms. (Patient not taking: Reported on 06/08/2018), Disp: 60 tablet,  Rfl: 2 .  hydrochlorothiazide (HYDRODIURIL) 25 MG tablet, Take 25 mg by mouth daily., Disp: , Rfl:  .  ibuprofen (ADVIL,MOTRIN) 600 MG tablet, TAKE 1 TABLET(600 MG) BY MOUTH THREE TIMES DAILY AS NEEDED, Disp: 270 tablet, Rfl: 1 .  insulin NPH Human (HUMULIN N,NOVOLIN N) 100 UNIT/ML injection, Inject 15 Units into the skin daily before breakfast. , Disp: , Rfl:  .  insulin regular (NOVOLIN R,HUMULIN R) 250 units/2.57mL (100 units/mL) injection, Inject 8 units at breakfast, and 15 units at lunch and 15 units at supper, Disp: , Rfl:  .  losartan (COZAAR) 100 MG tablet, Take 1 tablet (100 mg total) daily by mouth., Disp: 90 tablet, Rfl: 3 .  MULTIPLE VITAMIN PO, Take by mouth daily. , Disp: , Rfl:  .  omeprazole (PRILOSEC) 20 MG capsule, Take 20 mg by mouth daily., Disp: , Rfl:  .  ONE TOUCH ULTRA TEST test strip, , Disp: , Rfl:  .  pravastatin (PRAVACHOL) 10 MG tablet, TAKE ONE (1) TABLET BY MOUTH EVERY DAY, Disp: 90 tablet, Rfl: 3 .  ranitidine (ZANTAC) 150 MG tablet, Take 1 tablet (150 mg total) 2 (two) times daily by mouth. (Patient not taking: Reported on 06/08/2018), Disp: 180 tablet, Rfl: 3  Patient Care Team: Jerrol Banana., MD as PCP - General (Family Medicine) Eulogio Bear, MD as Consulting Physician (Ophthalmology) Gabriel Carina Betsey Holiday, MD as Physician Assistant (Endocrinology) Ralene Bathe, MD as Consulting Physician (Dermatology)     Objective:    Vitals: BP 136/58 (BP Location: Right Arm)    Pulse 68    Temp 98.3 F (36.8 C) (Oral)    Ht 5\' 8"  (1.727 m)    Wt 196 lb 12.8 oz (89.3 kg)    BMI 29.92 kg/m    BSA 2.07 m        More Vitals   Physical Exam  Constitutional: He is oriented to person, place, and time. He appears well-developed and well-nourished.  HENT:  Head: Normocephalic and atraumatic.  Right Ear: External ear normal.  Left Ear: External ear normal.  Nose: Nose normal.  Mouth/Throat: Oropharynx is clear and moist.  Eyes: Conjunctivae  are normal. No scleral icterus.  Left cataract.  Neck: No thyromegaly present.  Cardiovascular: Normal rate, regular rhythm and normal heart sounds.  Pulmonary/Chest: Effort normal and breath sounds normal.  Abdominal: Soft.  Musculoskeletal: He exhibits no edema.  Lymphadenopathy:    He has no cervical adenopathy.  Neurological: He is alert and oriented to person, place, and time.  Skin: Skin is warm and dry.  Psychiatric: He has a normal mood and affect. His behavior is normal. Judgment and thought  content normal.    Activities of Daily Living In your present state of health, do you have any difficulty performing the following activities: 06/08/2018  Hearing? Y  Comment Wears bilateral hearing aids.  Vision? Y  Comment Up close vision.   Difficulty concentrating or making decisions? N  Walking or climbing stairs? N  Dressing or bathing? N  Doing errands, shopping? N  Preparing Food and eating ? N  Using the Toilet? N  In the past six months, have you accidently leaked urine? N  Do you have problems with loss of bowel control? N  Managing your Medications? N  Managing your Finances? N  Housekeeping or managing your Housekeeping? N  Some recent data might be hidden    Fall Risk Assessment Fall Risk  06/08/2018 06/07/2017 12/06/2016 07/02/2015  Falls in the past year? No No No No     Depression Screen PHQ 2/9 Scores 06/08/2018 06/07/2017 12/06/2016 07/02/2015  PHQ - 2 Score 0 0 0 0    Cognitive Testing - 6-CIT 6CIT Screen 06/08/2018 06/07/2017  What Year? 4 points 0 points  What month? 0 points 0 points  What time? 0 points 0 points  Count back from 20 0 points 0 points  Months in reverse 2 points 4 points  Repeat phrase 0 points 0 points  Total Score 6 4         Assessment & Plan:     Annual Wellness Visit  Reviewed patient's Family Medical History Reviewed and updated list of patient's medical providers Assessment of cognitive impairment was done Assessed  patient's functional ability Established a written schedule for health screening [place] Completed and Reviewed  Exercise Activities and Dietary recommendations Goals    . DIET - INCREASE WATER INTAKE     Recommend increasing water intake to 6-8 glasses a day.        Immunization History  Administered Date(s) Administered  . Influenza, High Dose Seasonal PF 10/03/2015, 11/03/2016, 10/06/2017  . Pneumococcal Conjugate-13 12/24/2014  . Pneumococcal Polysaccharide-23 12/25/2012  . Td 07/02/2015  . Zoster 12/25/2012    Health Maintenance  Topic Date Due  . HEMOGLOBIN A1C  03/06/2018  . INFLUENZA VACCINE  07/20/2018  . OPHTHALMOLOGY EXAM  03/14/2019  . FOOT EXAM  04/05/2019  . TETANUS/TDAP  07/01/2025  . PNA vac Low Risk Adult  Completed     Discussed health benefits of physical activity, and encouraged him to engage in regular exercise appropriate for his age and condition.  TIIDM Per [name].  I have done the exam and reviewed the above chart and it is accurate to the best of my knowledge. Development worker, community has been used in this note in any air is in the dictation or transcription are unintentional.  Wilhemena Durie, MD  [place]

## 2018-06-08 NOTE — Patient Instructions (Addendum)
[name] , Thank you for taking time to come for your Medicare Wellness Visit. I appreciate your ongoing commitment to your health goals. Please review the following plan we discussed and let me know if I can assist you in the future.   Screening recommendations/referrals: Colonoscopy: Up to date Recommended yearly ophthalmology/optometry visit for glaucoma screening and checkup Recommended yearly dental visit for hygiene and checkup  Vaccinations: Influenza vaccine: Up to date Pneumococcal vaccine: Up to date Tdap vaccine: Up to date Shingles vaccine: Pt declines today.     Advanced directives: Please bring a copy of your POA (Power of Attorney) and/or Living Will to your next appointment.   Conditions/risks identified: Recommend increasing water intake to 6-8 glasses a day.   Next appointment: 9:40 AM with Dr Rosanna Randy.  Preventive Care 17 Years and Older, Male Preventive care refers to lifestyle choices and visits with your health care provider that can promote health and wellness. What does preventive care include?  A yearly physical exam. This is also called an annual well check.  Dental exams once or twice a year.  Routine eye exams. Ask your health care provider how often you should have your eyes checked.  Personal lifestyle choices, including:  Daily care of your teeth and gums.  Regular physical activity.  Eating a healthy diet.  Avoiding tobacco and drug use.  Limiting alcohol use.  Practicing safe sex.  Taking low doses of aspirin every day.  Taking vitamin and mineral supplements as recommended by your health care provider. What happens during an annual well check? The services and screenings done by your health care provider during your annual well check will depend on your age, overall health, lifestyle risk factors, and family history of disease. Counseling  Your health care provider may ask you questions about your:  Alcohol use.  Tobacco  use.  Drug use.  Emotional well-being.  Home and relationship well-being.  Sexual activity.  Eating habits.  History of falls.  Memory and ability to understand (cognition).  Work and work Statistician. Screening  You may have the following tests or measurements:  Height, weight, and BMI.  Blood pressure.  Lipid and cholesterol levels. These may be checked every 5 years, or more frequently if you are over 63 years old.  Skin check.  Lung cancer screening. You may have this screening every year starting at age 60 if you have a 30-pack-year history of smoking and currently smoke or have quit within the past 15 years.  Fecal occult blood test (FOBT) of the stool. You may have this test every year starting at age 79.  Flexible sigmoidoscopy or colonoscopy. You may have a sigmoidoscopy every 5 years or a colonoscopy every 10 years starting at age 80.  Prostate cancer screening. Recommendations will vary depending on your family history and other risks.  Hepatitis C blood test.  Hepatitis B blood test.  Sexually transmitted disease (STD) testing.  Diabetes screening. This is done by checking your blood sugar (glucose) after you have not eaten for a while (fasting). You may have this done every 1-3 years.  Abdominal aortic aneurysm (AAA) screening. You may need this if you are a current or former smoker.  Osteoporosis. You may be screened starting at age 54 if you are at high risk. Talk with your health care provider about your test results, treatment options, and if necessary, the need for more tests. Vaccines  Your health care provider may recommend certain vaccines, such as:  Influenza vaccine.  This is recommended every year.  Tetanus, diphtheria, and acellular pertussis (Tdap, Td) vaccine. You may need a Td booster every 10 years.  Zoster vaccine. You may need this after age 67.  Pneumococcal 13-valent conjugate (PCV13) vaccine. One dose is recommended after age  71.  Pneumococcal polysaccharide (PPSV23) vaccine. One dose is recommended after age 86. Talk to your health care provider about which screenings and vaccines you need and how often you need them. This information is not intended to replace advice given to you by your health care provider. Make sure you discuss any questions you have with your health care provider. Document Released: 01/02/2016 Document Revised: 08/25/2016 Document Reviewed: 10/07/2015 Elsevier Interactive Patient Education  2017 [employer] Prevention in the Home Falls can cause injuries. They can happen to people of all ages. There are many things you can do to make your home safe and to help prevent falls. What can I do on the outside of my home?  Regularly fix the edges of walkways and driveways and fix any cracks.  Remove anything that might make you trip as you walk through a door, such as a raised step or threshold.  Trim any bushes or trees on the path to your home.  Use bright outdoor lighting.  Clear any walking paths of anything that might make someone trip, such as rocks or tools.  Regularly check to see if handrails are loose or broken. Make sure that both sides of any steps have handrails.  Any raised decks and porches should have guardrails on the edges.  Have any leaves, snow, or ice cleared regularly.  Use sand or salt on walking paths during winter.  Clean up any spills in your garage right away. This includes oil or grease spills. What can I do in the bathroom?  Use night lights.  Install grab bars by the toilet and in the tub and shower. Do not use towel bars as grab bars.  Use non-skid mats or decals in the tub or shower.  If you need to sit down in the shower, use a plastic, non-slip stool.  Keep the floor dry. Clean up any water that spills on the floor as soon as it happens.  Remove soap buildup in the tub or shower regularly.  Attach bath mats securely with double-sided  non-slip rug tape.  Do not have throw rugs and other things on the floor that can make you trip. What can I do in the bedroom?  Use night lights.  Make sure that you have a light by your bed that is easy to reach.  Do not use any sheets or blankets that are too big for your bed. They should not hang down onto the floor.  Have a firm chair that has side arms. You can use this for support while you get dressed.  Do not have throw rugs and other things on the floor that can make you trip. What can I do in the kitchen?  Clean up any spills right away.  Avoid walking on wet floors.  Keep items that you use a lot in easy-to-reach places.  If you need to reach something above you, use a strong step stool that has a grab bar.  Keep electrical cords out of the way.  Do not use floor polish or wax that makes floors slippery. If you must use wax, use non-skid floor wax.  Do not have throw rugs and other things on the floor that can make  you trip. What can I do with my stairs?  Do not leave any items on the stairs.  Make sure that there are handrails on both sides of the stairs and use them. Fix handrails that are broken or loose. Make sure that handrails are as long as the stairways.  Check any carpeting to make sure that it is firmly attached to the stairs. Fix any carpet that is loose or worn.  Avoid having throw rugs at the top or bottom of the stairs. If you do have throw rugs, attach them to the floor with carpet tape.  Make sure that you have a light switch at the top of the stairs and the bottom of the stairs. If you do not have them, ask someone to add them for you. What else can I do to help prevent falls?  Wear shoes that:  Do not have high heels.  Have rubber bottoms.  Are comfortable and fit you well.  Are closed at the toe. Do not wear sandals.  If you use a stepladder:  Make sure that it is fully opened. Do not climb a closed stepladder.  Make sure that both  sides of the stepladder are locked into place.  Ask someone to hold it for you, if possible.  Clearly mark and make sure that you can see:  Any grab bars or handrails.  First and last steps.  Where the edge of each step is.  Use tools that help you move around (mobility aids) if they are needed. These include:  Canes.  Walkers.  Scooters.  Crutches.  Turn on the lights when you go into a dark area. Replace any light bulbs as soon as they burn out.  Set up your furniture so you have a clear path. Avoid moving your furniture around.  If any of your floors are uneven, fix them.  If there are any pets around you, be aware of where they are.  Review your medicines with your doctor. Some medicines can make you feel dizzy. This can increase your chance of falling. Ask your doctor what other things that you can do to help prevent falls. This information is not intended to replace advice given to you by your health care provider. Make sure you discuss any questions you have with your health care provider. Document Released: 10/02/2009 Document Revised: 05/13/2016 Document Reviewed: 01/10/2015 Elsevier Interactive Patient Education  2017 [employer].

## 2018-06-12 ENCOUNTER — Ambulatory Visit: Payer: Medicare HMO | Admitting: Physical Therapy

## 2018-06-16 ENCOUNTER — Ambulatory Visit: Payer: Medicare HMO | Admitting: Physical Therapy

## 2018-06-19 ENCOUNTER — Encounter: Payer: Medicare HMO | Admitting: Physical Therapy

## 2018-06-21 ENCOUNTER — Encounter: Payer: Medicare HMO | Admitting: Physical Therapy

## 2018-06-26 ENCOUNTER — Encounter: Payer: Medicare HMO | Admitting: Physical Therapy

## 2018-06-28 ENCOUNTER — Encounter: Payer: Medicare HMO | Admitting: Physical Therapy

## 2018-06-29 DIAGNOSIS — E119 Type 2 diabetes mellitus without complications: Secondary | ICD-10-CM | POA: Diagnosis not present

## 2018-07-12 DIAGNOSIS — E1165 Type 2 diabetes mellitus with hyperglycemia: Secondary | ICD-10-CM | POA: Diagnosis not present

## 2018-07-19 DIAGNOSIS — Z794 Long term (current) use of insulin: Secondary | ICD-10-CM | POA: Diagnosis not present

## 2018-07-19 DIAGNOSIS — E113293 Type 2 diabetes mellitus with mild nonproliferative diabetic retinopathy without macular edema, bilateral: Secondary | ICD-10-CM | POA: Diagnosis not present

## 2018-07-19 DIAGNOSIS — E1122 Type 2 diabetes mellitus with diabetic chronic kidney disease: Secondary | ICD-10-CM | POA: Diagnosis not present

## 2018-07-19 DIAGNOSIS — E1165 Type 2 diabetes mellitus with hyperglycemia: Secondary | ICD-10-CM | POA: Diagnosis not present

## 2018-07-19 DIAGNOSIS — N184 Chronic kidney disease, stage 4 (severe): Secondary | ICD-10-CM | POA: Diagnosis not present

## 2018-07-19 DIAGNOSIS — E1121 Type 2 diabetes mellitus with diabetic nephropathy: Secondary | ICD-10-CM | POA: Diagnosis not present

## 2018-08-04 DIAGNOSIS — E119 Type 2 diabetes mellitus without complications: Secondary | ICD-10-CM | POA: Diagnosis not present

## 2018-08-29 DIAGNOSIS — C44219 Basal cell carcinoma of skin of left ear and external auricular canal: Secondary | ICD-10-CM | POA: Diagnosis not present

## 2018-08-29 DIAGNOSIS — C4491 Basal cell carcinoma of skin, unspecified: Secondary | ICD-10-CM

## 2018-08-29 HISTORY — DX: Basal cell carcinoma of skin, unspecified: C44.91

## 2018-09-04 DIAGNOSIS — C44219 Basal cell carcinoma of skin of left ear and external auricular canal: Secondary | ICD-10-CM | POA: Diagnosis not present

## 2018-09-05 DIAGNOSIS — E119 Type 2 diabetes mellitus without complications: Secondary | ICD-10-CM | POA: Diagnosis not present

## 2018-09-12 DIAGNOSIS — E113313 Type 2 diabetes mellitus with moderate nonproliferative diabetic retinopathy with macular edema, bilateral: Secondary | ICD-10-CM | POA: Diagnosis not present

## 2018-09-12 DIAGNOSIS — H2512 Age-related nuclear cataract, left eye: Secondary | ICD-10-CM | POA: Diagnosis not present

## 2018-09-14 DIAGNOSIS — R69 Illness, unspecified: Secondary | ICD-10-CM | POA: Diagnosis not present

## 2018-09-15 ENCOUNTER — Other Ambulatory Visit: Payer: Self-pay | Admitting: Family Medicine

## 2018-09-15 DIAGNOSIS — M545 Low back pain, unspecified: Secondary | ICD-10-CM

## 2018-09-28 ENCOUNTER — Other Ambulatory Visit: Payer: Self-pay | Admitting: Family Medicine

## 2018-09-28 DIAGNOSIS — C44219 Basal cell carcinoma of skin of left ear and external auricular canal: Secondary | ICD-10-CM | POA: Diagnosis not present

## 2018-10-16 ENCOUNTER — Ambulatory Visit: Payer: Medicare HMO | Admitting: Family Medicine

## 2018-10-16 DIAGNOSIS — E1121 Type 2 diabetes mellitus with diabetic nephropathy: Secondary | ICD-10-CM | POA: Diagnosis not present

## 2018-10-16 DIAGNOSIS — Z794 Long term (current) use of insulin: Secondary | ICD-10-CM | POA: Diagnosis not present

## 2018-10-20 DIAGNOSIS — E119 Type 2 diabetes mellitus without complications: Secondary | ICD-10-CM | POA: Diagnosis not present

## 2018-11-06 ENCOUNTER — Ambulatory Visit: Payer: Medicare HMO | Admitting: Family Medicine

## 2018-11-06 ENCOUNTER — Encounter: Payer: Self-pay | Admitting: Family Medicine

## 2018-11-06 VITALS — BP 134/62 | HR 68 | Temp 97.9°F | Resp 16 | Ht 68.0 in | Wt 198.0 lb

## 2018-11-06 DIAGNOSIS — Z794 Long term (current) use of insulin: Secondary | ICD-10-CM

## 2018-11-06 DIAGNOSIS — I10 Essential (primary) hypertension: Secondary | ICD-10-CM | POA: Diagnosis not present

## 2018-11-06 DIAGNOSIS — E119 Type 2 diabetes mellitus without complications: Secondary | ICD-10-CM

## 2018-11-06 DIAGNOSIS — E113293 Type 2 diabetes mellitus with mild nonproliferative diabetic retinopathy without macular edema, bilateral: Secondary | ICD-10-CM | POA: Diagnosis not present

## 2018-11-06 DIAGNOSIS — E1122 Type 2 diabetes mellitus with diabetic chronic kidney disease: Secondary | ICD-10-CM | POA: Diagnosis not present

## 2018-11-06 DIAGNOSIS — E7800 Pure hypercholesterolemia, unspecified: Secondary | ICD-10-CM | POA: Diagnosis not present

## 2018-11-06 DIAGNOSIS — N183 Chronic kidney disease, stage 3 (moderate): Secondary | ICD-10-CM | POA: Diagnosis not present

## 2018-11-06 DIAGNOSIS — Z23 Encounter for immunization: Secondary | ICD-10-CM | POA: Diagnosis not present

## 2018-11-06 DIAGNOSIS — E1165 Type 2 diabetes mellitus with hyperglycemia: Secondary | ICD-10-CM | POA: Diagnosis not present

## 2018-11-06 DIAGNOSIS — L309 Dermatitis, unspecified: Secondary | ICD-10-CM | POA: Diagnosis not present

## 2018-11-06 DIAGNOSIS — E1121 Type 2 diabetes mellitus with diabetic nephropathy: Secondary | ICD-10-CM | POA: Diagnosis not present

## 2018-11-06 NOTE — Progress Notes (Signed)
Patient: Stephen White Male    DOB: 1936-04-07   82 y.o.   MRN: [id number] Visit Date: 11/06/2018  Today's Provider: Wilhemena Durie, MD   Chief Complaint  Patient presents with  . Hyperlipidemia  . Hypertension   Subjective:    HPI  Hypertension, follow-up:  BP Readings from Last 3 Encounters:  11/06/18 134/62  06/08/18 (!) 136/58  04/25/18 132/62    He was last seen for hypertension 5 months ago.  BP at that visit was 136/58. Management since that visit includes . He reports good compliance with treatment. He is not having side effects.  He is not exercising. He is adherent to low salt diet.   Outside blood pressures are not being checked. He is experiencing none.  Patient denies exertional chest pressure/discomfort, lower extremity edema and palpitations.   Cardiovascular risk factors include diabetes mellitus and dyslipidemia.   Weight trend: stable Wt Readings from Last 3 Encounters:  11/06/18 198 lb (89.8 kg)  06/08/18 196 lb 12.8 oz (89.3 kg)  04/25/18 198 lb (89.8 kg)    Current diet: well balanced   Lipid/Cholesterol, Follow-up:   Last seen for this5 months ago.  Management changes since that visit include no changes. . Last Lipid Panel:    Component Value Date/Time   CHOL 178 12/05/2017 0944   CHOL 157 11/04/2016 0853   TRIG 105 12/05/2017 0944   HDL 59 12/05/2017 0944   HDL 49 11/04/2016 0853   CHOLHDL 3.0 12/05/2017 0944   LDLCALC 99 12/05/2017 0944    Risk factors for vascular disease include diabetes mellitus and hypertension  He reports good compliance with treatment. He is not having side effects.  Current symptoms include none and have been stable.  No Known Allergies   Current Outpatient Medications:  .  amLODipine (NORVASC) 5 MG tablet, Take 5 mg by mouth daily. , Disp: , Rfl:  .  aspirin 81 MG tablet, Take 81 mg by mouth daily. , Disp: , Rfl:  .  hydrochlorothiazide (HYDRODIURIL) 25 MG tablet, Take 25 mg by  mouth daily., Disp: , Rfl:  .  ibuprofen (ADVIL,MOTRIN) 600 MG tablet, TAKE 1 TABLET(600 MG) BY MOUTH THREE TIMES DAILY AS NEEDED, Disp: 270 tablet, Rfl: 1 .  insulin NPH Human (HUMULIN N,NOVOLIN N) 100 UNIT/ML injection, Inject 15 Units into the skin daily before breakfast. , Disp: , Rfl:  .  insulin regular (NOVOLIN R,HUMULIN R) 250 units/2.48mL (100 units/mL) injection, Inject 8 units at breakfast, and 15 units at lunch and 15 units at supper, Disp: , Rfl:  .  losartan (COZAAR) 100 MG tablet, Take 1 tablet (100 mg total) daily by mouth., Disp: 90 tablet, Rfl: 3 .  cyclobenzaprine (FLEXERIL) 5 MG tablet, Take 1 tablet (5 mg total) by mouth 3 (three) times daily as needed for muscle spasms. (Patient not taking: Reported on 06/08/2018), Disp: 60 tablet, Rfl: 2 .  MULTIPLE VITAMIN PO, Take by mouth daily. , Disp: , Rfl:  .  omeprazole (PRILOSEC) 20 MG capsule, Take 20 mg by mouth daily., Disp: , Rfl:  .  ONE TOUCH ULTRA TEST test strip, , Disp: , Rfl:  .  pravastatin (PRAVACHOL) 10 MG tablet, TAKE 1 TABLET BY MOUTH EVERY DAY, Disp: 90 tablet, Rfl: 3 .  ranitidine (ZANTAC) 150 MG tablet, Take 1 tablet (150 mg total) 2 (two) times daily by mouth. (Patient not taking: Reported on 06/08/2018), Disp: 180 tablet, Rfl: 3  Review of Systems  Constitutional:  Negative.   HENT: Negative.   Eyes: Negative.   Respiratory: Negative.   Cardiovascular: Negative.   Endocrine: Negative.   Musculoskeletal: Negative.   Allergic/Immunologic: Negative.   Neurological: Negative.   Psychiatric/Behavioral: Negative.     Social History   Tobacco Use  . Smoking status: Former Smoker    Packs/day: 0.50    Years: 15.00    Pack years: 7.50    Types: Cigarettes    Last attempt to quit: 12/19/1978    Years since quitting: 39.9  . Smokeless tobacco: Never Used  Substance Use Topics  . Alcohol use: No    Alcohol/week: 0.0 standard drinks   Objective:   BP 134/62   Pulse 68   Temp 97.9 F (36.6 C)   Resp 16    Ht 5\' 8"  (1.727 m)   Wt 198 lb (89.8 kg)   SpO2 96%   BMI 30.11 kg/m  Vitals:   11/06/18 1325  BP: 134/62  Pulse: 68  Resp: 16  Temp: 97.9 F (36.6 C)  SpO2: 96%  Weight: 198 lb (89.8 kg)  Height: 5\' 8"  (1.727 m)     Physical Exam  Constitutional: He is oriented to person, place, and time. He appears well-developed and well-nourished.  HENT:  Head: Normocephalic and atraumatic.  Right Ear: External ear normal.  Left Ear: External ear normal.  Nose: Nose normal.  Eyes: Conjunctivae are normal. No scleral icterus.  Neck: No thyromegaly present.  Cardiovascular: Normal rate, regular rhythm and normal heart sounds.  Pulmonary/Chest: Effort normal and breath sounds normal.  Abdominal: Soft.  Musculoskeletal: He exhibits no edema.  Neurological: He is alert and oriented to person, place, and time.  Skin: Skin is dry.  Psychiatric: He has a normal mood and affect. His behavior is normal. Judgment and thought content normal.        Assessment & Plan:     1. Essential (primary) hypertension  - Comprehensive metabolic panel  2. Pure hypercholesterolemia  - Lipid panel - TSH  3. Type 2 diabetes mellitus treated with insulin ([place]) Per Endocrine. - CBC with Differential/Platelet  4. Eczema, unspecified type Behind knee.  5. Needs flu shot  - Flu vaccine HIGH DOSE PF (Fluzone High dose     I have done the exam and reviewed the above chart and it is accurate to the best of my knowledge. Development worker, community has been used in this note in any air is in the dictation or transcription are unintentional.  Wilhemena Durie, MD  [facility]

## 2018-11-07 DIAGNOSIS — E119 Type 2 diabetes mellitus without complications: Secondary | ICD-10-CM | POA: Diagnosis not present

## 2018-11-07 DIAGNOSIS — Z794 Long term (current) use of insulin: Secondary | ICD-10-CM | POA: Diagnosis not present

## 2018-11-07 DIAGNOSIS — I10 Essential (primary) hypertension: Secondary | ICD-10-CM | POA: Diagnosis not present

## 2018-11-07 DIAGNOSIS — E7800 Pure hypercholesterolemia, unspecified: Secondary | ICD-10-CM | POA: Diagnosis not present

## 2018-11-08 LAB — COMPREHENSIVE METABOLIC PANEL
ALBUMIN: 4.1 g/dL (ref 3.5–4.8)
ALK PHOS: 55 IU/L (ref 39–117)
ALT: 17 IU/L (ref 0–44)
AST: 16 IU/L (ref 0–40)
Albumin/Globulin Ratio: 1.6 (ref 1.2–2.2)
BUN/Creatinine Ratio: 16 (ref 10–24)
BUN: 37 mg/dL — AB (ref 8–27)
Bilirubin Total: 0.4 mg/dL (ref 0.0–1.2)
CALCIUM: 9.6 mg/dL (ref 8.6–10.2)
CO2: 22 mmol/L (ref 20–29)
CREATININE: 2.32 mg/dL — AB (ref 0.76–1.27)
Chloride: 102 mmol/L (ref 96–106)
GFR calc Af Amer: 30 mL/min/{1.73_m2} — ABNORMAL LOW (ref >59)
GFR calc non Af Amer: 26 mL/min/{1.73_m2} — ABNORMAL LOW (ref >59)
GLUCOSE: 139 mg/dL — AB (ref 65–99)
Globulin, Total: 2.5 g/dL (ref 1.5–4.5)
Potassium: 4.7 mmol/L (ref 3.5–5.2)
Sodium: 141 mmol/L (ref 134–144)
Total Protein: 6.6 g/dL (ref 6.0–8.5)

## 2018-11-08 LAB — LIPID PANEL
CHOLESTEROL TOTAL: 162 mg/dL (ref 100–199)
Chol/HDL Ratio: 3 ratio (ref 0.0–5.0)
HDL: 54 mg/dL (ref >39)
LDL CALC: 92 mg/dL (ref 0–99)
Triglycerides: 78 mg/dL (ref 0–149)
VLDL CHOLESTEROL CAL: 16 mg/dL (ref 5–40)

## 2018-11-08 LAB — CBC WITH DIFFERENTIAL/PLATELET
Basophils Absolute: 0.1 10*3/uL (ref 0.0–0.2)
Basos: 1 %
EOS (ABSOLUTE): 1 10*3/uL — ABNORMAL HIGH (ref 0.0–0.4)
EOS: 11 %
HEMOGLOBIN: 13.8 g/dL (ref 13.0–17.7)
Hematocrit: 41.2 % (ref 37.5–51.0)
IMMATURE GRANULOCYTES: 0 %
Immature Grans (Abs): 0 10*3/uL (ref 0.0–0.1)
LYMPHS ABS: 2.7 10*3/uL (ref 0.7–3.1)
LYMPHS: 29 %
MCH: 30.9 pg (ref 26.6–33.0)
MCHC: 33.5 g/dL (ref 31.5–35.7)
MCV: 92 fL (ref 79–97)
MONOCYTES: 10 %
Monocytes Absolute: 0.9 10*3/uL (ref 0.1–0.9)
NEUTROS PCT: 49 %
Neutrophils Absolute: 4.4 10*3/uL (ref 1.4–7.0)
Platelets: 278 10*3/uL (ref 150–450)
RBC: 4.47 x10E6/uL (ref 4.14–5.80)
RDW: 12.4 % (ref 12.3–15.4)
WBC: 9.3 10*3/uL (ref 3.4–10.8)

## 2018-11-08 LAB — TSH: TSH: 2.67 u[IU]/mL (ref 0.450–4.500)

## 2018-11-22 DIAGNOSIS — E119 Type 2 diabetes mellitus without complications: Secondary | ICD-10-CM | POA: Diagnosis not present

## 2018-11-23 DIAGNOSIS — L578 Other skin changes due to chronic exposure to nonionizing radiation: Secondary | ICD-10-CM | POA: Diagnosis not present

## 2018-11-23 DIAGNOSIS — Z85828 Personal history of other malignant neoplasm of skin: Secondary | ICD-10-CM | POA: Diagnosis not present

## 2018-11-23 DIAGNOSIS — L570 Actinic keratosis: Secondary | ICD-10-CM | POA: Diagnosis not present

## 2018-12-04 ENCOUNTER — Other Ambulatory Visit: Payer: Self-pay | Admitting: Family Medicine

## 2018-12-28 DIAGNOSIS — E119 Type 2 diabetes mellitus without complications: Secondary | ICD-10-CM | POA: Diagnosis not present

## 2019-01-15 ENCOUNTER — Other Ambulatory Visit: Payer: Self-pay | Admitting: Family Medicine

## 2019-01-15 DIAGNOSIS — K219 Gastro-esophageal reflux disease without esophagitis: Secondary | ICD-10-CM

## 2019-01-15 NOTE — Telephone Encounter (Signed)
Please review. Thanks!  

## 2019-01-15 NOTE — Telephone Encounter (Signed)
[facility] faxed refill request for the following medications:  ranitidine (ZANTAC) 150 MG tablet   Please advise.

## 2019-01-16 NOTE — Telephone Encounter (Signed)
Patient's wife is calling stating the this medication ranitidine (ZANTAC) 150 MG tablet is on recall and they need a alternative sent to [facility] in [place].

## 2019-01-17 MED ORDER — FAMOTIDINE 20 MG PO TABS: 20.0000 mg | ORAL_TABLET | Freq: Two times a day (BID) | ORAL | 5 refills | Status: DC

## 2019-01-17 NOTE — Telephone Encounter (Signed)
Sent med into the pharmacy. Patient was advised.

## 2019-01-17 NOTE — Telephone Encounter (Signed)
Famotadine 20mg  BID prn please/

## 2019-01-30 DIAGNOSIS — E119 Type 2 diabetes mellitus without complications: Secondary | ICD-10-CM | POA: Diagnosis not present

## 2019-02-07 DIAGNOSIS — Z794 Long term (current) use of insulin: Secondary | ICD-10-CM | POA: Diagnosis not present

## 2019-02-07 DIAGNOSIS — E1121 Type 2 diabetes mellitus with diabetic nephropathy: Secondary | ICD-10-CM | POA: Diagnosis not present

## 2019-02-14 DIAGNOSIS — Z794 Long term (current) use of insulin: Secondary | ICD-10-CM | POA: Diagnosis not present

## 2019-02-14 DIAGNOSIS — N184 Chronic kidney disease, stage 4 (severe): Secondary | ICD-10-CM | POA: Diagnosis not present

## 2019-02-14 DIAGNOSIS — E1121 Type 2 diabetes mellitus with diabetic nephropathy: Secondary | ICD-10-CM | POA: Insufficient documentation

## 2019-02-14 DIAGNOSIS — E1165 Type 2 diabetes mellitus with hyperglycemia: Secondary | ICD-10-CM | POA: Diagnosis not present

## 2019-02-14 DIAGNOSIS — E1122 Type 2 diabetes mellitus with diabetic chronic kidney disease: Secondary | ICD-10-CM | POA: Diagnosis not present

## 2019-02-14 DIAGNOSIS — E113293 Type 2 diabetes mellitus with mild nonproliferative diabetic retinopathy without macular edema, bilateral: Secondary | ICD-10-CM | POA: Diagnosis not present

## 2019-02-27 DIAGNOSIS — E119 Type 2 diabetes mellitus without complications: Secondary | ICD-10-CM | POA: Diagnosis not present

## 2019-04-05 DIAGNOSIS — E119 Type 2 diabetes mellitus without complications: Secondary | ICD-10-CM | POA: Diagnosis not present

## 2019-04-26 ENCOUNTER — Encounter: Payer: Self-pay | Admitting: Family Medicine

## 2019-04-26 ENCOUNTER — Ambulatory Visit: Payer: Medicare HMO | Admitting: Family Medicine

## 2019-04-26 ENCOUNTER — Other Ambulatory Visit: Payer: Self-pay

## 2019-04-26 VITALS — BP 152/68 | HR 69 | Temp 97.6°F | Ht 67.0 in | Wt 194.8 lb

## 2019-04-26 DIAGNOSIS — E7800 Pure hypercholesterolemia, unspecified: Secondary | ICD-10-CM

## 2019-04-26 DIAGNOSIS — Z794 Long term (current) use of insulin: Secondary | ICD-10-CM | POA: Diagnosis not present

## 2019-04-26 DIAGNOSIS — E119 Type 2 diabetes mellitus without complications: Secondary | ICD-10-CM | POA: Diagnosis not present

## 2019-04-26 DIAGNOSIS — I10 Essential (primary) hypertension: Secondary | ICD-10-CM

## 2019-04-26 DIAGNOSIS — L603 Nail dystrophy: Secondary | ICD-10-CM | POA: Diagnosis not present

## 2019-04-26 NOTE — Progress Notes (Signed)
Patient: Stephen White Male    DOB: 06-13-1936   83 y.o.   MRN: [id number] Visit Date: 04/26/2019  Today's Provider: Wilhemena Durie, MD   Chief Complaint  Patient presents with  . fingernail fungus   Subjective:     HPI  Pt reports that his fingernails are red and breaks off on both hands.  The nails have ridges in them also.  He noticed it about 1 month ago or longer Feb 2020.   Pt reports the he has a dark spot on the back of his right knee.  No itching or burning. No Known Allergies   Current Outpatient Medications:  .  amLODipine (NORVASC) 5 MG tablet, Take 5 mg by mouth daily. , Disp: , Rfl:  .  aspirin 81 MG tablet, Take 81 mg by mouth daily. , Disp: , Rfl:  .  famotidine (PEPCID) 20 MG tablet, Take 1 tablet (20 mg total) by mouth 2 (two) times daily., Disp: 60 tablet, Rfl: 5 .  hydrochlorothiazide (HYDRODIURIL) 25 MG tablet, Take 25 mg by mouth daily., Disp: , Rfl:  .  ibuprofen (ADVIL,MOTRIN) 600 MG tablet, TAKE 1 TABLET(600 MG) BY MOUTH THREE TIMES DAILY AS NEEDED, Disp: 270 tablet, Rfl: 1 .  insulin NPH Human (HUMULIN N,NOVOLIN N) 100 UNIT/ML injection, Inject into the skin daily before breakfast. Takes 20 units in the morning, 15 units at lunch and 20 at night, Disp: , Rfl:  .  insulin regular (NOVOLIN R,HUMULIN R) 250 units/2.62mL (100 units/mL) injection, 15 units in the morning and 10 at night, Disp: , Rfl:  .  losartan (COZAAR) 100 MG tablet, TAKE 1 TABLET BY MOUTH EVERY DAY, Disp: 90 tablet, Rfl: 3 .  MULTIPLE VITAMIN PO, Take by mouth daily. , Disp: , Rfl:  .  omeprazole (PRILOSEC) 20 MG capsule, Take 20 mg by mouth daily., Disp: , Rfl:  .  ONE TOUCH ULTRA TEST test strip, , Disp: , Rfl:  .  pravastatin (PRAVACHOL) 10 MG tablet, TAKE 1 TABLET BY MOUTH EVERY DAY, Disp: 90 tablet, Rfl: 3  Review of Systems  Constitutional: Negative.   HENT: Negative.   Eyes: Negative.   Respiratory: Negative.   Cardiovascular: Negative.   Gastrointestinal: Negative.    Endocrine: Negative.   Genitourinary: Negative.   Musculoskeletal: Negative.   Skin: Negative.   Allergic/Immunologic: Negative.   Neurological: Negative.   Hematological: Negative.   Psychiatric/Behavioral: Negative.     Social History   Tobacco Use  . Smoking status: Former Smoker    Packs/day: 0.50    Years: 15.00    Pack years: 7.50    Types: Cigarettes    Last attempt to quit: 12/19/1978    Years since quitting: 40.3  . Smokeless tobacco: Never Used  Substance Use Topics  . Alcohol use: No    Alcohol/week: 0.0 standard drinks      Objective:   BP (!) 152/68 (BP Location: Right Arm, Patient Position: Sitting, Cuff Size: Normal)   Pulse 69   Temp 97.6 F (36.4 C) (Oral)   Ht 5\' 7"  (1.702 m)   Wt 194 lb 12.8 oz (88.4 kg)   SpO2 97%   BMI 30.51 kg/m  Vitals:   04/26/19 1356  BP: (!) 152/68  Pulse: 69  Temp: 97.6 F (36.4 C)  TempSrc: Oral  SpO2: 97%  Weight: 194 lb 12.8 oz (88.4 kg)  Height: 5\' 7"  (1.702 m)     Physical Exam Vitals signs reviewed.  Constitutional:      Appearance: Normal appearance.  HENT:     Right Ear: External ear normal.     Left Ear: External ear normal.  Eyes:     General: No scleral icterus. Cardiovascular:     Rate and Rhythm: Normal rate and regular rhythm.     Heart sounds: Normal heart sounds.  Pulmonary:     Effort: Pulmonary effort is normal.     Breath sounds: Normal breath sounds.  Skin:    General: Skin is warm and dry.     Comments: Brittle fingernails with ridges.  Neurological:     Mental Status: He is alert and oriented to person, place, and time. Mental status is at baseline.  Psychiatric:        Behavior: Behavior normal.        Thought Content: Thought content normal.        Judgment: Judgment normal.         Assessment & Plan    1. Brittle nails I do not think this is onychomychosis. - Ambulatory referral to Dermatology  2. Essential (primary) hypertension   3. Type 2 diabetes mellitus  treated with insulin ([place]) Followed by endocrine.  4. Pure hypercholesterolemia      Wilhemena Durie, MD  [facility]

## 2019-05-08 ENCOUNTER — Ambulatory Visit: Payer: Self-pay | Admitting: Family Medicine

## 2019-05-08 DIAGNOSIS — E119 Type 2 diabetes mellitus without complications: Secondary | ICD-10-CM | POA: Diagnosis not present

## 2019-05-10 DIAGNOSIS — D0461 Carcinoma in situ of skin of right upper limb, including shoulder: Secondary | ICD-10-CM | POA: Diagnosis not present

## 2019-05-10 DIAGNOSIS — D2230 Melanocytic nevi of unspecified part of face: Secondary | ICD-10-CM | POA: Diagnosis not present

## 2019-05-10 DIAGNOSIS — L570 Actinic keratosis: Secondary | ICD-10-CM | POA: Diagnosis not present

## 2019-05-10 DIAGNOSIS — L209 Atopic dermatitis, unspecified: Secondary | ICD-10-CM | POA: Diagnosis not present

## 2019-05-10 DIAGNOSIS — D229 Melanocytic nevi, unspecified: Secondary | ICD-10-CM | POA: Diagnosis not present

## 2019-05-10 DIAGNOSIS — Z85828 Personal history of other malignant neoplasm of skin: Secondary | ICD-10-CM | POA: Diagnosis not present

## 2019-05-10 DIAGNOSIS — Z1283 Encounter for screening for malignant neoplasm of skin: Secondary | ICD-10-CM | POA: Diagnosis not present

## 2019-05-10 DIAGNOSIS — L578 Other skin changes due to chronic exposure to nonionizing radiation: Secondary | ICD-10-CM | POA: Diagnosis not present

## 2019-05-10 DIAGNOSIS — D225 Melanocytic nevi of trunk: Secondary | ICD-10-CM | POA: Diagnosis not present

## 2019-05-10 DIAGNOSIS — L821 Other seborrheic keratosis: Secondary | ICD-10-CM | POA: Diagnosis not present

## 2019-05-18 ENCOUNTER — Other Ambulatory Visit: Payer: Self-pay

## 2019-05-18 ENCOUNTER — Ambulatory Visit: Payer: Medicare HMO | Admitting: Podiatry

## 2019-05-18 ENCOUNTER — Encounter: Payer: Self-pay | Admitting: Podiatry

## 2019-05-18 VITALS — BP 130/67 | HR 69 | Temp 97.9°F

## 2019-05-18 DIAGNOSIS — M79676 Pain in unspecified toe(s): Secondary | ICD-10-CM

## 2019-05-18 DIAGNOSIS — B351 Tinea unguium: Secondary | ICD-10-CM

## 2019-05-18 DIAGNOSIS — E0843 Diabetes mellitus due to underlying condition with diabetic autonomic (poly)neuropathy: Secondary | ICD-10-CM

## 2019-05-18 NOTE — Progress Notes (Signed)
[phone] 

## 2019-05-21 NOTE — Progress Notes (Signed)
   SUBJECTIVE Patient with a history of diabetes mellitus presents to office today complaining of elongated, thickened nails that cause pain while ambulating in shoes. He is unable to trim his own nails. Patient is here for further evaluation and treatment.   Past Medical History:  Diagnosis Date  . Chronic kidney disease   . Diabetes mellitus without complication ([place])   . GERD (gastroesophageal reflux disease)   . Hyperlipidemia   . Hypertension   . Thyroid disease     OBJECTIVE General Patient is awake, alert, and oriented x 3 and in no acute distress. Derm Skin is dry and supple bilateral. Negative open lesions or macerations. Remaining integument unremarkable. Nails are tender, long, thickened and dystrophic with subungual debris, consistent with onychomycosis, 1-5 bilateral. No signs of infection noted. Vasc  DP and PT pedal pulses palpable bilaterally. Temperature gradient within normal limits.  Neuro Epicritic and protective threshold sensation diminished bilaterally.  Musculoskeletal Exam No symptomatic pedal deformities noted bilateral. Muscular strength within normal limits.  ASSESSMENT 1. Diabetes Mellitus w/ peripheral neuropathy 2. Onychomycosis of nail due to dermatophyte bilateral 3. Pain in foot bilateral  PLAN OF CARE 1. Patient evaluated today. 2. Instructed to maintain good pedal hygiene and foot care. Stressed importance of controlling blood sugar.  3. Mechanical debridement of nails 1-5 bilaterally performed using a nail nipper. Filed with dremel without incident.  4. Return to clinic in 3 mos.     Edrick Kins, DPM [facility]  Dr. Edrick Kins, [address]                                        [place], [place] [address]                Office [phone]  Fax [phone]

## 2019-05-25 DIAGNOSIS — E113293 Type 2 diabetes mellitus with mild nonproliferative diabetic retinopathy without macular edema, bilateral: Secondary | ICD-10-CM | POA: Diagnosis not present

## 2019-05-25 DIAGNOSIS — Z794 Long term (current) use of insulin: Secondary | ICD-10-CM | POA: Diagnosis not present

## 2019-05-29 DIAGNOSIS — E1122 Type 2 diabetes mellitus with diabetic chronic kidney disease: Secondary | ICD-10-CM | POA: Diagnosis not present

## 2019-05-29 DIAGNOSIS — E113293 Type 2 diabetes mellitus with mild nonproliferative diabetic retinopathy without macular edema, bilateral: Secondary | ICD-10-CM | POA: Diagnosis not present

## 2019-05-29 DIAGNOSIS — E1165 Type 2 diabetes mellitus with hyperglycemia: Secondary | ICD-10-CM | POA: Diagnosis not present

## 2019-05-29 DIAGNOSIS — E1121 Type 2 diabetes mellitus with diabetic nephropathy: Secondary | ICD-10-CM | POA: Diagnosis not present

## 2019-05-29 DIAGNOSIS — N184 Chronic kidney disease, stage 4 (severe): Secondary | ICD-10-CM | POA: Diagnosis not present

## 2019-05-29 DIAGNOSIS — Z794 Long term (current) use of insulin: Secondary | ICD-10-CM | POA: Diagnosis not present

## 2019-06-07 DIAGNOSIS — E119 Type 2 diabetes mellitus without complications: Secondary | ICD-10-CM | POA: Diagnosis not present

## 2019-06-13 ENCOUNTER — Encounter: Payer: Medicare HMO | Admitting: Family Medicine

## 2019-06-13 ENCOUNTER — Ambulatory Visit: Payer: Medicare HMO

## 2019-06-20 DIAGNOSIS — E119 Type 2 diabetes mellitus without complications: Secondary | ICD-10-CM | POA: Diagnosis not present

## 2019-07-03 DIAGNOSIS — H2512 Age-related nuclear cataract, left eye: Secondary | ICD-10-CM | POA: Diagnosis not present

## 2019-07-03 DIAGNOSIS — E113313 Type 2 diabetes mellitus with moderate nonproliferative diabetic retinopathy with macular edema, bilateral: Secondary | ICD-10-CM | POA: Diagnosis not present

## 2019-07-21 DIAGNOSIS — E119 Type 2 diabetes mellitus without complications: Secondary | ICD-10-CM | POA: Diagnosis not present

## 2019-07-26 DIAGNOSIS — E113313 Type 2 diabetes mellitus with moderate nonproliferative diabetic retinopathy with macular edema, bilateral: Secondary | ICD-10-CM | POA: Diagnosis not present

## 2019-07-26 DIAGNOSIS — H2512 Age-related nuclear cataract, left eye: Secondary | ICD-10-CM | POA: Diagnosis not present

## 2019-07-26 LAB — HM DIABETES EYE EXAM

## 2019-08-23 ENCOUNTER — Encounter: Payer: Self-pay | Admitting: Podiatry

## 2019-08-23 ENCOUNTER — Other Ambulatory Visit: Payer: Self-pay

## 2019-08-23 ENCOUNTER — Ambulatory Visit: Payer: Medicare HMO | Admitting: Podiatry

## 2019-08-23 DIAGNOSIS — B351 Tinea unguium: Secondary | ICD-10-CM | POA: Diagnosis not present

## 2019-08-23 DIAGNOSIS — M79676 Pain in unspecified toe(s): Secondary | ICD-10-CM

## 2019-08-23 DIAGNOSIS — E0843 Diabetes mellitus due to underlying condition with diabetic autonomic (poly)neuropathy: Secondary | ICD-10-CM

## 2019-08-23 NOTE — Progress Notes (Signed)
Complaint:  Visit Type: Patient returns to my office for continued preventative foot care services. Complaint: Patient states" my nails have grown long and thick and become painful to walk and wear shoes" Patient has been diagnosed with DM with no foot complications. The patient presents for preventative foot care services. No changes to ROS  Podiatric Exam: Vascular: dorsalis pedis and posterior tibial pulses are palpable bilateral. Capillary return is immediate. Temperature gradient is WNL. Skin turgor WNL  Sensorium: Normal Semmes Weinstein monofilament test. Normal tactile sensation bilaterally. Nail Exam: Pt has thick disfigured discolored nails with subungual debris noted bilateral entire nail hallux through fifth toenails Ulcer Exam: There is no evidence of ulcer or pre-ulcerative changes or infection. Orthopedic Exam: Muscle tone and strength are WNL. No limitations in general ROM. No crepitus or effusions noted. Foot type and digits show no abnormalities. Bony prominences are unremarkable. Skin: No Porokeratosis. No infection or ulcers  Diagnosis:  Onychomycosis, , Pain in right toe, pain in left toes  Treatment & Plan Procedures and Treatment: Consent by patient was obtained for treatment procedures.   Debridement of mycotic and hypertrophic toenails, 1 through 5 bilateral and clearing of subungual debris. No ulceration, no infection noted.  Return Visit-Office Procedure: Patient instructed to return to the office for a follow up visit 3 months for continued evaluation and treatment.    Etosha Wetherell DPM 

## 2019-08-29 DIAGNOSIS — E1165 Type 2 diabetes mellitus with hyperglycemia: Secondary | ICD-10-CM | POA: Diagnosis not present

## 2019-09-05 DIAGNOSIS — E113293 Type 2 diabetes mellitus with mild nonproliferative diabetic retinopathy without macular edema, bilateral: Secondary | ICD-10-CM | POA: Diagnosis not present

## 2019-09-05 DIAGNOSIS — E1165 Type 2 diabetes mellitus with hyperglycemia: Secondary | ICD-10-CM | POA: Diagnosis not present

## 2019-09-05 DIAGNOSIS — E1121 Type 2 diabetes mellitus with diabetic nephropathy: Secondary | ICD-10-CM | POA: Diagnosis not present

## 2019-09-05 DIAGNOSIS — E1122 Type 2 diabetes mellitus with diabetic chronic kidney disease: Secondary | ICD-10-CM | POA: Diagnosis not present

## 2019-09-05 DIAGNOSIS — N184 Chronic kidney disease, stage 4 (severe): Secondary | ICD-10-CM | POA: Diagnosis not present

## 2019-09-05 DIAGNOSIS — Z794 Long term (current) use of insulin: Secondary | ICD-10-CM | POA: Diagnosis not present

## 2019-09-10 DIAGNOSIS — E119 Type 2 diabetes mellitus without complications: Secondary | ICD-10-CM | POA: Diagnosis not present

## 2019-09-27 DIAGNOSIS — E113299 Type 2 diabetes mellitus with mild nonproliferative diabetic retinopathy without macular edema, unspecified eye: Secondary | ICD-10-CM | POA: Insufficient documentation

## 2019-10-04 ENCOUNTER — Other Ambulatory Visit: Payer: Self-pay | Admitting: Family Medicine

## 2019-10-04 DIAGNOSIS — K219 Gastro-esophageal reflux disease without esophagitis: Secondary | ICD-10-CM

## 2019-10-05 ENCOUNTER — Other Ambulatory Visit: Payer: Self-pay | Admitting: Family Medicine

## 2019-10-17 DIAGNOSIS — E119 Type 2 diabetes mellitus without complications: Secondary | ICD-10-CM | POA: Diagnosis not present

## 2019-11-02 ENCOUNTER — Ambulatory Visit (INDEPENDENT_AMBULATORY_CARE_PROVIDER_SITE_OTHER): Payer: Medicare HMO

## 2019-11-02 DIAGNOSIS — Z23 Encounter for immunization: Secondary | ICD-10-CM

## 2019-11-19 DIAGNOSIS — E119 Type 2 diabetes mellitus without complications: Secondary | ICD-10-CM | POA: Diagnosis not present

## 2019-11-22 ENCOUNTER — Other Ambulatory Visit: Payer: Self-pay

## 2019-11-22 ENCOUNTER — Encounter: Payer: Self-pay | Admitting: Podiatry

## 2019-11-22 ENCOUNTER — Ambulatory Visit (INDEPENDENT_AMBULATORY_CARE_PROVIDER_SITE_OTHER): Payer: Medicare HMO | Admitting: Podiatry

## 2019-11-22 DIAGNOSIS — B351 Tinea unguium: Secondary | ICD-10-CM

## 2019-11-22 DIAGNOSIS — M79676 Pain in unspecified toe(s): Secondary | ICD-10-CM

## 2019-11-22 DIAGNOSIS — E0843 Diabetes mellitus due to underlying condition with diabetic autonomic (poly)neuropathy: Secondary | ICD-10-CM

## 2019-11-22 NOTE — Progress Notes (Signed)
Complaint:  Visit Type: Patient returns to my office for continued preventative foot care services. Complaint: Patient states" my nails have grown long and thick and become painful to walk and wear shoes" Patient has been diagnosed with DM with no foot complications. The patient presents for preventative foot care services. No changes to ROS  Podiatric Exam: Vascular: dorsalis pedis and posterior tibial pulses are palpable bilateral. Capillary return is immediate. Temperature gradient is WNL. Skin turgor WNL  Sensorium: Normal Semmes Weinstein monofilament test. Normal tactile sensation bilaterally. Nail Exam: Pt has thick disfigured discolored nails with subungual debris noted bilateral entire nail hallux through fifth toenails Ulcer Exam: There is no evidence of ulcer or pre-ulcerative changes or infection. Orthopedic Exam: Muscle tone and strength are WNL. No limitations in general ROM. No crepitus or effusions noted. Foot type and digits show no abnormalities. Bony prominences are unremarkable. Skin: No Porokeratosis. No infection or ulcers  Diagnosis:  Onychomycosis, , Pain in right toe, pain in left toes  Treatment & Plan Procedures and Treatment: Consent by patient was obtained for treatment procedures.   Debridement of mycotic and hypertrophic toenails, 1 through 5 bilateral and clearing of subungual debris. No ulceration, no infection noted.  Return Visit-Office Procedure: Patient instructed to return to the office for a follow up visit 6  months for continued evaluation and treatment.    August Longest DPM 

## 2019-11-30 DIAGNOSIS — Z794 Long term (current) use of insulin: Secondary | ICD-10-CM | POA: Diagnosis not present

## 2019-11-30 DIAGNOSIS — E1121 Type 2 diabetes mellitus with diabetic nephropathy: Secondary | ICD-10-CM | POA: Diagnosis not present

## 2019-11-30 LAB — HEMOGLOBIN A1C: Hemoglobin A1C: 8.5

## 2019-12-11 ENCOUNTER — Telehealth: Payer: Self-pay

## 2019-12-11 DIAGNOSIS — Z794 Long term (current) use of insulin: Secondary | ICD-10-CM | POA: Diagnosis not present

## 2019-12-11 DIAGNOSIS — E1165 Type 2 diabetes mellitus with hyperglycemia: Secondary | ICD-10-CM | POA: Diagnosis not present

## 2019-12-11 DIAGNOSIS — E1122 Type 2 diabetes mellitus with diabetic chronic kidney disease: Secondary | ICD-10-CM | POA: Diagnosis not present

## 2019-12-11 DIAGNOSIS — N184 Chronic kidney disease, stage 4 (severe): Secondary | ICD-10-CM | POA: Diagnosis not present

## 2019-12-11 DIAGNOSIS — E785 Hyperlipidemia, unspecified: Secondary | ICD-10-CM | POA: Diagnosis not present

## 2019-12-11 DIAGNOSIS — E113293 Type 2 diabetes mellitus with mild nonproliferative diabetic retinopathy without macular edema, bilateral: Secondary | ICD-10-CM | POA: Diagnosis not present

## 2019-12-11 DIAGNOSIS — E1169 Type 2 diabetes mellitus with other specified complication: Secondary | ICD-10-CM | POA: Diagnosis not present

## 2019-12-11 DIAGNOSIS — E1121 Type 2 diabetes mellitus with diabetic nephropathy: Secondary | ICD-10-CM | POA: Diagnosis not present

## 2019-12-11 NOTE — Telephone Encounter (Signed)
Scheduled telephonic AWV on 12/18/19 @ 10:20 AM.

## 2019-12-11 NOTE — Telephone Encounter (Signed)
Called pt to schedule a telephonic AWV and pt states he is not home currently and would like a CB around 330 this afternoon. Note made.

## 2019-12-17 NOTE — Progress Notes (Signed)
Subjective:   Stephen White is a 83 y.o. male who presents for Medicare Annual/Subsequent preventive examination.    This visit is being conducted through telemedicine due to the COVID-19 pandemic. This patient has given me verbal consent via doximity to conduct this visit, patient states they are participating from their home address. Some vital signs may be absent or patient reported.    Patient identification: identified by name, DOB, and current address  Review of Systems:  N/A  Cardiac Risk Factors include: advanced age (>5men, >77 women);diabetes mellitus;dyslipidemia;hypertension;male gender     Objective:    Vitals: There were no vitals taken for this visit.  There is no height or weight on file to calculate BMI. Unable to obtain vitals due to visit being conducted via telephonically.   Advanced Directives 12/18/2019 06/08/2018 05/09/2018 06/07/2017 10/03/2015 07/02/2015  Does Patient Have a Medical Advance Directive? Yes Yes Yes Yes Yes Yes  Type of Paramedic of [place];Living will [place];Living will [place];Living will Living will [place];Living will Living will  Does patient want to make changes to medical advance directive? - - Yes (Inpatient - patient defers changing a medical advance directive at this time) - - -  Copy of [place] in Chart? No - copy requested No - copy requested No - copy requested - No - copy requested -    Tobacco Social History   Tobacco Use  Smoking Status Former Smoker  . Packs/day: 0.50  . Years: 15.00  . Pack years: 7.50  . Types: Cigarettes  . Quit date: 12/19/1978  . Years since quitting: 41.0  Smokeless Tobacco Never Used     Counseling given: Not Answered   Clinical Intake:  Pre-visit preparation completed: Yes  Pain : No/denies pain Pain Score: 0-No pain     Nutritional Risks: None Diabetes: Yes  How often do  you need to have someone help you when you read instructions, pamphlets, or other written materials from your doctor or pharmacy?: 1 - Never   Diabetes:  Is the patient diabetic?  Yes type 2 If diabetic, was a CBG obtained today?  No  Did the patient bring in their glucometer from home?  No  How often do you monitor your CBG's? Several time daily.   Financial Strains and Diabetes Management:  Are you having any financial strains with the device, your supplies or your medication? No .  Does the patient want to be seen by Chronic Care Management for management of their diabetes?  No  Would the patient like to be referred to a Nutritionist or for Diabetic Management?  No   Diabetic Exams:  Diabetic Eye Exam: Completed 07/26/19. Repeat yearly.  Diabetic Foot Exam: Completed 07/26/19. Repeat yearly.  Interpreter Needed?: No  Information entered by :: [facility], LPN  Past Medical History:  Diagnosis Date  . Chronic kidney disease   . Diabetes mellitus without complication ([place])   . GERD (gastroesophageal reflux disease)   . Hyperlipidemia   . Hypertension   . Thyroid disease    Past Surgical History:  Procedure Laterality Date  . EYE SURGERY     Family History  Problem Relation Age of Onset  . Diabetes Mother   . Diabetes Father   . Diabetes Brother   . Diabetes Sister   . Diabetes Sister   . Diabetes Brother   . Diabetes Brother   . Diabetes Brother   . Cancer  Brother   . Diabetes Brother    Social History   Socioeconomic History  . Marital status: Married    Spouse name: Not on file  . Number of children: 1  . Years of education: Not on file  . Highest education level: 12th grade  Occupational History  . Occupation: retired  Tobacco Use  . Smoking status: Former Smoker    Packs/day: 0.50    Years: 15.00    Pack years: 7.50    Types: Cigarettes    Quit date: 12/19/1978    Years since quitting: 41.0  . Smokeless tobacco: Never Used  Substance and Sexual  Activity  . Alcohol use: No    Alcohol/week: 0.0 standard drinks  . Drug use: No  . Sexual activity: Not on file  Other Topics Concern  . Not on file  Social History Narrative  . Not on file   Social Determinants of Health   Financial Resource Strain: Low Risk   . Difficulty of Paying Living Expenses: Not hard at all  Food Insecurity: No Food Insecurity  . Worried About Charity fundraiser in the Last Year: Never true  . Ran Out of Food in the Last Year: Never true  Transportation Needs: No Transportation Needs  . Lack of Transportation (Medical): No  . Lack of Transportation (Non-Medical): No  Physical Activity: Inactive  . Days of Exercise per Week: 0 days  . Minutes of Exercise per Session: 0 min  Stress: No Stress Concern Present  . Feeling of Stress : Not at all  Social Connections: Somewhat Isolated  . Frequency of Communication with Friends and Family: Never  . Frequency of Social Gatherings with Friends and Family: Never  . Attends Religious Services: More than 4 times per year  . Active Member of Clubs or Organizations: No  . Attends Archivist Meetings: Never  . Marital Status: Married    Outpatient Encounter Medications as of 12/18/2019  Medication Sig  . amLODipine (NORVASC) 5 MG tablet Take 5 mg by mouth daily.   Marland Kitchen aspirin 81 MG tablet Take 81 mg by mouth daily.   . Biotin (BIOTIN 5000) 5 MG CAPS Take by mouth daily.  . Calcium-Magnesium-Zinc 333-133-5 MG TABS Take 1 tablet by mouth daily. Unsure dose  . Continuous Blood Gluc Receiver (FREESTYLE LIBRE 14 DAY READER) DEVI by Does not apply route.  . Emollient (GOLD BOND ULTIMATE) LOTN Apply topically at bedtime. Diabetic lotion  . famotidine (PEPCID) 20 MG tablet TAKE 1 TABLET(20 MG) BY MOUTH TWICE DAILY  . hydrochlorothiazide (HYDRODIURIL) 25 MG tablet Take 25 mg by mouth daily.  . Insulin Detemir (LEVEMIR) 100 UNIT/ML Pen Inject into the skin. 25 units in AM and 25 units in PM - depending on BS  reading  . Insulin Lispro (HUMALOG KWIKPEN [place]) Inject into the skin as directed. As needed depending on BS readings twice daily Prescribed as 25 in AM, 15 at dinner and 25 at supper.  . loratadine (ALLERGY RELIEF) 10 MG tablet Take 10 mg by mouth daily as needed for allergies.  Marland Kitchen losartan (COZAAR) 100 MG tablet TAKE 1 TABLET BY MOUTH EVERY DAY  . MULTIPLE VITAMIN PO Take by mouth daily.   . ONE TOUCH ULTRA TEST test strip 1 each by Other route as needed.   Marland Kitchen OZEMPIC, 0.25 OR 0.5 MG/DOSE, 2 MG/1.5ML SOPN Inject 0.5 mg into the skin once a week.  . pravastatin (PRAVACHOL) 10 MG tablet TAKE 1 TABLET BY MOUTH EVERY DAY  .  ibuprofen (ADVIL,MOTRIN) 600 MG tablet TAKE 1 TABLET(600 MG) BY MOUTH THREE TIMES DAILY AS NEEDED (Patient not taking: Reported on 12/18/2019)  . insulin NPH Human (HUMULIN N,NOVOLIN N) 100 UNIT/ML injection Inject into the skin daily before breakfast. Takes 25 units in the morning, 15 units at lunch and 20 at night  . insulin regular (NOVOLIN R,HUMULIN R) 250 units/2.45mL (100 units/mL) injection 15 units in the morning and 10 at night  . omeprazole (PRILOSEC) 20 MG capsule Take 20 mg by mouth daily.  . Semaglutide,0.25 or 0.5MG /DOS, 2 MG/1.5ML SOPN Inject 0.5 mg weekly   No facility-administered encounter medications on file as of 12/18/2019.    Activities of Daily Living In your present state of health, do you have any difficulty performing the following activities: 12/18/2019  Hearing? Y  Comment Wears bilteral hearing aids.  Vision? N  Difficulty concentrating or making decisions? N  Walking or climbing stairs? Y  Comment Due to tri-focal glasses. Pt states he needs new glasses. Pt plans to schedule an eye exam in 2021.  Dressing or bathing? N  Doing errands, shopping? N  Preparing Food and eating ? N  Using the Toilet? N  In the past six months, have you accidently leaked urine? Y  Comment Occasionally due to urges.  Do you have problems with loss of bowel control?  N  Managing your Medications? N  Managing your Finances? Y  Comment Wife manages finances.  Housekeeping or managing your Housekeeping? N  Some recent data might be hidden    Patient Care Team: Jerrol Banana., MD as PCP - General (Family Medicine) Eulogio Bear, MD as Consulting Physician (Ophthalmology) Gabriel Carina Betsey Holiday, MD as Physician Assistant (Endocrinology) Ralene Bathe, MD as Consulting Physician (Dermatology)   Assessment:   This is a routine wellness examination for [employer].  Exercise Activities and Dietary recommendations Current Exercise Habits: The patient does not participate in regular exercise at present, Exercise limited by: None identified  Goals    . DIET - INCREASE WATER INTAKE     Recommend increasing water intake to 6-8 glasses a day.        Fall Risk Fall Risk  12/18/2019 04/26/2019 06/08/2018 06/07/2017 12/06/2016  Falls in the past year? 0 0 No No No  Number falls in past yr: 0 - - - -  Injury with Fall? 0 - - - -   FALL RISK PREVENTION PERTAINING TO THE HOME:  Any stairs in or around the home? Yes  If so, are there any without handrails? No   Home free of loose throw rugs in walkways, pet beds, electrical cords, etc? Yes  Adequate lighting in your home to reduce risk of falls? Yes   ASSISTIVE DEVICES UTILIZED TO PREVENT FALLS:  Life alert? No  Use of a cane, walker or w/c? No  Grab bars in the bathroom? Yes  Shower chair or bench in shower? No  Elevated toilet seat or a handicapped toilet? Yes    TIMED UP AND GO:  Was the test performed? No .    Depression Screen PHQ 2/9 Scores 12/18/2019 04/26/2019 06/08/2018 06/07/2017  PHQ - 2 Score 0 0 0 0    Cognitive Function: Declined today.      6CIT Screen 06/08/2018 06/07/2017  What Year? 4 points 0 points  What month? 0 points 0 points  What time? 0 points 0 points  Count back from 20 0 points 0 points  Months in reverse 2 points 4 points  Repeat phrase 0 points 0 points    Total Score 6 4    Immunization History  Administered Date(s) Administered  . Fluad Quad(high Dose 65+) 11/02/2019  . Influenza, High Dose Seasonal PF 10/03/2015, 11/03/2016, 10/06/2017, 11/06/2018  . Pneumococcal Conjugate-13 12/24/2014  . Pneumococcal Polysaccharide-23 12/25/2012  . Td 07/02/2015  . Zoster 12/25/2012    Qualifies for Shingles Vaccine? Yes  Zostavax completed 12/25/12. Due for Shingrix. Pt has been advised to call insurance company to determine out of pocket expense. Advised may also receive vaccine at local pharmacy or Health Dept. Verbalized acceptance and understanding.  Tdap: Up to date  Flu Vaccine: Up to date  Pneumococcal Vaccine: Up to date  Screening Tests Health Maintenance  Topic Date Due  . HEMOGLOBIN A1C  06/10/2020  . OPHTHALMOLOGY EXAM  07/25/2020  . FOOT EXAM  08/22/2020  . TETANUS/TDAP  07/01/2025  . INFLUENZA VACCINE  Completed  . PNA vac Low Risk Adult  Completed   Cancer Screenings:  Colorectal Screening: No longer required.   Lung Cancer Screening: (Low Dose CT Chest recommended if Age 73-80 years, 30 pack-year currently smoking OR have quit w/in 15years.) does not qualify.   Additional Screening:  Dental Screening: Recommended annual dental exams for proper oral hygiene  Community Resource Referral:  CRR required this visit?  No        Plan:  I have personally reviewed and addressed the Medicare Annual Wellness questionnaire and have noted the following in the patient's chart:  A. Medical and social history B. Use of alcohol, tobacco or illicit drugs  C. Current medications and supplements D. Functional ability and status E.  Nutritional status F.  Physical activity G. Advance directives H. List of other physicians I.  Hospitalizations, surgeries, and ER visits in previous 12 months J.  [place] such as hearing and vision if needed, cognitive and depression L. Referrals and appointments   In addition, I  have reviewed and discussed with patient certain preventive protocols, quality metrics, and best practice recommendations. A written personalized care plan for preventive services as well as general preventive health recommendations were provided to patient.   Glendora Score, LPN  12/22/7251 Nurse Health Advisor  Nurse Notes: None.

## 2019-12-18 ENCOUNTER — Ambulatory Visit (INDEPENDENT_AMBULATORY_CARE_PROVIDER_SITE_OTHER): Payer: Medicare HMO

## 2019-12-18 ENCOUNTER — Other Ambulatory Visit: Payer: Self-pay | Admitting: Family Medicine

## 2019-12-18 ENCOUNTER — Other Ambulatory Visit: Payer: Self-pay

## 2019-12-18 DIAGNOSIS — Z0000 Encounter for general adult medical examination without abnormal findings: Secondary | ICD-10-CM | POA: Diagnosis not present

## 2019-12-18 MED ORDER — LOSARTAN POTASSIUM 100 MG PO TABS: 100.0000 mg | ORAL_TABLET | Freq: Every day | ORAL | 3 refills | Status: DC

## 2019-12-18 NOTE — Telephone Encounter (Signed)
[facility] faxed refill request for the following medications:   losartan (COZAAR) 100 MG tablet    Please advise.  

## 2019-12-18 NOTE — Patient Instructions (Signed)
[name] , Thank you for taking time to come for your Medicare Wellness Visit. I appreciate your ongoing commitment to your health goals. Please review the following plan we discussed and let me know if I can assist you in the future.   Screening recommendations/referrals: Colonoscopy: No longer required.  Recommended yearly ophthalmology/optometry visit for glaucoma screening and checkup Recommended yearly dental visit for hygiene and checkup  Vaccinations: Influenza vaccine: Up to date Pneumococcal vaccine: Completed series Tdap vaccine: Up to date, due 06/2025 Shingles vaccine: Pt declines today.     Advanced directives: Please bring a copy of your POA (Power of Attorney) and/or Living Will to your next appointment.   Conditions/risks identified: Continue to increase water intake to 6-8 8 oz glasses a day.   Next appointment: 01/10/20 @ 2:20 PM with Dr Rosanna Randy.   Preventive Care 65 Years and Older, Male Preventive care refers to lifestyle choices and visits with your health care provider that can promote health and wellness. What does preventive care include?  A yearly physical exam. This is also called an annual well check.  Dental exams once or twice a year.  Routine eye exams. Ask your health care provider how often you should have your eyes checked.  Personal lifestyle choices, including:  Daily care of your teeth and gums.  Regular physical activity.  Eating a healthy diet.  Avoiding tobacco and drug use.  Limiting alcohol use.  Practicing safe sex.  Taking low doses of aspirin every day.  Taking vitamin and mineral supplements as recommended by your health care provider. What happens during an annual well check? The services and screenings done by your health care provider during your annual well check will depend on your age, overall health, lifestyle risk factors, and family history of disease. Counseling  Your health care provider may ask you questions  about your:  Alcohol use.  Tobacco use.  Drug use.  Emotional well-being.  Home and relationship well-being.  Sexual activity.  Eating habits.  History of falls.  Memory and ability to understand (cognition).  Work and work Statistician. Screening  You may have the following tests or measurements:  Height, weight, and BMI.  Blood pressure.  Lipid and cholesterol levels. These may be checked every 5 years, or more frequently if you are over 63 years old.  Skin check.  Lung cancer screening. You may have this screening every year starting at age 34 if you have a 30-pack-year history of smoking and currently smoke or have quit within the past 15 years.  Fecal occult blood test (FOBT) of the stool. You may have this test every year starting at age 13.  Flexible sigmoidoscopy or colonoscopy. You may have a sigmoidoscopy every 5 years or a colonoscopy every 10 years starting at age 62.  Prostate cancer screening. Recommendations will vary depending on your family history and other risks.  Hepatitis C blood test.  Hepatitis B blood test.  Sexually transmitted disease (STD) testing.  Diabetes screening. This is done by checking your blood sugar (glucose) after you have not eaten for a while (fasting). You may have this done every 1-3 years.  Abdominal aortic aneurysm (AAA) screening. You may need this if you are a current or former smoker.  Osteoporosis. You may be screened starting at age 32 if you are at high risk. Talk with your health care provider about your test results, treatment options, and if necessary, the need for more tests. Vaccines  Your health care provider may  recommend certain vaccines, such as:  Influenza vaccine. This is recommended every year.  Tetanus, diphtheria, and acellular pertussis (Tdap, Td) vaccine. You may need a Td booster every 10 years.  Zoster vaccine. You may need this after age 38.  Pneumococcal 13-valent conjugate (PCV13)  vaccine. One dose is recommended after age 8.  Pneumococcal polysaccharide (PPSV23) vaccine. One dose is recommended after age 27. Talk to your health care provider about which screenings and vaccines you need and how often you need them. This information is not intended to replace advice given to you by your health care provider. Make sure you discuss any questions you have with your health care provider. Document Released: 01/02/2016 Document Revised: 08/25/2016 Document Reviewed: 10/07/2015 Elsevier Interactive Patient Education  2017 [place] Prevention in the Home Falls can cause injuries. They can happen to people of all ages. There are many things you can do to make your home safe and to help prevent falls. What can I do on the outside of my home?  Regularly fix the edges of walkways and driveways and fix any cracks.  Remove anything that might make you trip as you walk through a door, such as a raised step or threshold.  Trim any bushes or trees on the path to your home.  Use bright outdoor lighting.  Clear any walking paths of anything that might make someone trip, such as rocks or tools.  Regularly check to see if handrails are loose or broken. Make sure that both sides of any steps have handrails.  Any raised decks and porches should have guardrails on the edges.  Have any leaves, snow, or ice cleared regularly.  Use sand or salt on walking paths during winter.  Clean up any spills in your garage right away. This includes oil or grease spills. What can I do in the bathroom?  Use night lights.  Install grab bars by the toilet and in the tub and shower. Do not use towel bars as grab bars.  Use non-skid mats or decals in the tub or shower.  If you need to sit down in the shower, use a plastic, non-slip stool.  Keep the floor dry. Clean up any water that spills on the floor as soon as it happens.  Remove soap buildup in the tub or shower  regularly.  Attach bath mats securely with double-sided non-slip rug tape.  Do not have throw rugs and other things on the floor that can make you trip. What can I do in the bedroom?  Use night lights.  Make sure that you have a light by your bed that is easy to reach.  Do not use any sheets or blankets that are too big for your bed. They should not hang down onto the floor.  Have a firm chair that has side arms. You can use this for support while you get dressed.  Do not have throw rugs and other things on the floor that can make you trip. What can I do in the kitchen?  Clean up any spills right away.  Avoid walking on wet floors.  Keep items that you use a lot in easy-to-reach places.  If you need to reach something above you, use a strong step stool that has a grab bar.  Keep electrical cords out of the way.  Do not use floor polish or wax that makes floors slippery. If you must use wax, use non-skid floor wax.  Do not have throw rugs and  other things on the floor that can make you trip. What can I do with my stairs?  Do not leave any items on the stairs.  Make sure that there are handrails on both sides of the stairs and use them. Fix handrails that are broken or loose. Make sure that handrails are as long as the stairways.  Check any carpeting to make sure that it is firmly attached to the stairs. Fix any carpet that is loose or worn.  Avoid having throw rugs at the top or bottom of the stairs. If you do have throw rugs, attach them to the floor with carpet tape.  Make sure that you have a light switch at the top of the stairs and the bottom of the stairs. If you do not have them, ask someone to add them for you. What else can I do to help prevent falls?  Wear shoes that:  Do not have high heels.  Have rubber bottoms.  Are comfortable and fit you well.  Are closed at the toe. Do not wear sandals.  If you use a stepladder:  Make sure that it is fully  opened. Do not climb a closed stepladder.  Make sure that both sides of the stepladder are locked into place.  Ask someone to hold it for you, if possible.  Clearly mark and make sure that you can see:  Any grab bars or handrails.  First and last steps.  Where the edge of each step is.  Use tools that help you move around (mobility aids) if they are needed. These include:  Canes.  Walkers.  Scooters.  Crutches.  Turn on the lights when you go into a dark area. Replace any light bulbs as soon as they burn out.  Set up your furniture so you have a clear path. Avoid moving your furniture around.  If any of your floors are uneven, fix them.  If there are any pets around you, be aware of where they are.  Review your medicines with your doctor. Some medicines can make you feel dizzy. This can increase your chance of falling. Ask your doctor what other things that you can do to help prevent falls. This information is not intended to replace advice given to you by your health care provider. Make sure you discuss any questions you have with your health care provider. Document Released: 10/02/2009 Document Revised: 05/13/2016 Document Reviewed: 01/10/2015 Elsevier Interactive Patient Education  2017 [employer].

## 2019-12-19 DIAGNOSIS — E119 Type 2 diabetes mellitus without complications: Secondary | ICD-10-CM | POA: Diagnosis not present

## 2020-01-10 ENCOUNTER — Ambulatory Visit: Payer: Medicare HMO | Admitting: Family Medicine

## 2020-01-10 ENCOUNTER — Encounter: Payer: Self-pay | Admitting: Family Medicine

## 2020-01-10 ENCOUNTER — Ambulatory Visit (INDEPENDENT_AMBULATORY_CARE_PROVIDER_SITE_OTHER): Payer: Medicare HMO | Admitting: Family Medicine

## 2020-01-10 DIAGNOSIS — K219 Gastro-esophageal reflux disease without esophagitis: Secondary | ICD-10-CM | POA: Diagnosis not present

## 2020-01-10 DIAGNOSIS — Z794 Long term (current) use of insulin: Secondary | ICD-10-CM | POA: Diagnosis not present

## 2020-01-10 DIAGNOSIS — N183 Chronic kidney disease, stage 3 unspecified: Secondary | ICD-10-CM

## 2020-01-10 DIAGNOSIS — E119 Type 2 diabetes mellitus without complications: Secondary | ICD-10-CM | POA: Diagnosis not present

## 2020-01-10 DIAGNOSIS — I10 Essential (primary) hypertension: Secondary | ICD-10-CM

## 2020-01-10 NOTE — Progress Notes (Signed)
Patient: Stephen White Male    DOB: 11-30-1936   84 y.o.   MRN: [id number] Visit Date: 01/10/2020  Today's Provider: Wilhemena Durie, MD   Chief Complaint  Patient presents with  . Follow-up  . Hypertension  . Hyperlipidemia  . Diabetes   Subjective:     Virtual Visit via Video Note  I connected with Stephen White on 01/10/20 at  2:20 PM EST by a video enabled telemedicine application and verified that I am speaking with the correct person using two identifiers.  Location: Patient: Home Provider: Home   I discussed the limitations of evaluation and management by telemedicine and the availability of in person appointments. The patient expressed understanding and agreed to proceed.    HPI   Essential (primary) hypertension From 11/06/2018-labs stable.  Pure hypercholesterolemia From 11/06/2018-labs stable.  Type 2 diabetes mellitus treated with insulin ([place]) Per Endocrine. From care everywhere on 11/30/2019 Hgb A1c was 8.5.    Allergies  Allergen Reactions  . Prednisone     Unsure reaction      Current Outpatient Medications:  .  amLODipine (NORVASC) 5 MG tablet, Take 5 mg by mouth daily. , Disp: , Rfl:  .  aspirin 81 MG tablet, Take 81 mg by mouth daily. , Disp: , Rfl:  .  Biotin (BIOTIN 5000) 5 MG CAPS, Take by mouth daily., Disp: , Rfl:  .  Calcium-Magnesium-Zinc 333-133-5 MG TABS, Take 1 tablet by mouth daily. Unsure dose, Disp: , Rfl:  .  Carboxymethylcellulose Sodium (EYE DROPS OP), Apply to eye., Disp: , Rfl:  .  Continuous Blood Gluc Receiver (FREESTYLE LIBRE 14 DAY READER) DEVI, by Does not apply route., Disp: , Rfl:  .  Emollient (GOLD BOND ULTIMATE) LOTN, Apply topically at bedtime. Diabetic lotion, Disp: , Rfl:  .  famotidine (PEPCID) 20 MG tablet, TAKE 1 TABLET(20 MG) BY MOUTH TWICE DAILY, Disp: 60 tablet, Rfl: 5 .  hydrochlorothiazide (HYDRODIURIL) 25 MG tablet, Take 25 mg by mouth daily., Disp: , Rfl:  .  Insulin Detemir (LEVEMIR)  100 UNIT/ML Pen, Inject into the skin. 25 units in AM and 25 units in PM - depending on BS reading, Disp: , Rfl:  .  Insulin Lispro (HUMALOG KWIKPEN [place]), Inject into the skin as directed. As needed depending on BS readings twice daily Prescribed as 25 in AM, 15 at dinner and 25 at supper., Disp: , Rfl:  .  losartan (COZAAR) 100 MG tablet, Take 1 tablet (100 mg total) by mouth daily., Disp: 90 tablet, Rfl: 3 .  MULTIPLE VITAMIN PO, Take by mouth daily. , Disp: , Rfl:  .  ONE TOUCH ULTRA TEST test strip, 1 each by Other route as needed. , Disp: , Rfl:  .  OZEMPIC, 0.25 OR 0.5 MG/DOSE, 2 MG/1.5ML SOPN, Inject 0.5 mg into the skin once a week., Disp: , Rfl:  .  pravastatin (PRAVACHOL) 10 MG tablet, TAKE 1 TABLET BY MOUTH EVERY DAY, Disp: 90 tablet, Rfl: 3 .  ibuprofen (ADVIL,MOTRIN) 600 MG tablet, TAKE 1 TABLET(600 MG) BY MOUTH THREE TIMES DAILY AS NEEDED (Patient not taking: Reported on 12/18/2019), Disp: 270 tablet, Rfl: 1 .  insulin NPH Human (HUMULIN N,NOVOLIN N) 100 UNIT/ML injection, Inject into the skin daily before breakfast. Takes 25 units in the morning, 15 units at lunch and 20 at night, Disp: , Rfl:  .  insulin regular (NOVOLIN R,HUMULIN R) 250 units/2.75mL (100 units/mL) injection, 15 units in the morning and  10 at night, Disp: , Rfl:  .  loratadine (ALLERGY RELIEF) 10 MG tablet, Take 10 mg by mouth daily as needed for allergies., Disp: , Rfl:  .  omeprazole (PRILOSEC) 20 MG capsule, Take 20 mg by mouth daily., Disp: , Rfl:  .  Semaglutide,0.25 or 0.5MG /DOS, 2 MG/1.5ML SOPN, Inject 0.5 mg weekly, Disp: , Rfl:   Review of Systems  Constitutional: Negative for appetite change, chills and fever.  Respiratory: Negative for chest tightness, shortness of breath and wheezing.   Cardiovascular: Negative for chest pain and palpitations.  Gastrointestinal: Negative for abdominal pain, nausea and vomiting.    Social History   Tobacco Use  . Smoking status: Former Smoker    Packs/day: 0.50     Years: 15.00    Pack years: 7.50    Types: Cigarettes    Quit date: 12/19/1978    Years since quitting: 41.0  . Smokeless tobacco: Never Used  Substance Use Topics  . Alcohol use: No    Alcohol/week: 0.0 standard drinks      Objective:   There were no vitals taken for this visit. There were no vitals filed for this visit.There is no height or weight on file to calculate BMI.   Physical Exam   Results for orders placed or performed in visit on 01/10/20  Hemoglobin A1c  Result Value Ref Range   Hemoglobin A1C 8.5        Assessment & Plan     1. Essential (primary) hypertension Stable.  2. Gastro-esophageal reflux disease without esophagitis   3. Type 2 diabetes mellitus treated with insulin (HCC)   4. Stage 3 chronic kidney disease, unspecified whether stage 3a or 3b CKD   I discussed the assessment and treatment plan with the patient. The patient was provided an opportunity to ask questions and all were answered. The patient agreed with the plan and demonstrated an understanding of the instructions.   The patient was advised to call back or seek an in-person evaluation if the symptoms worsen or if the condition fails to improve as anticipated.  I provided 12 minutes of non-face-to-face time during this encounter.    Richard Cranford Mon, MD  [facility]

## 2020-01-30 LAB — HM DIABETES EYE EXAM

## 2020-02-19 ENCOUNTER — Encounter: Payer: Self-pay | Admitting: Family Medicine

## 2020-02-19 ENCOUNTER — Other Ambulatory Visit: Payer: Self-pay

## 2020-02-19 ENCOUNTER — Ambulatory Visit: Payer: Medicare HMO | Admitting: Family Medicine

## 2020-02-19 VITALS — BP 110/60 | HR 68 | Temp 97.5°F | Resp 16 | Ht 67.0 in | Wt 190.0 lb

## 2020-02-19 DIAGNOSIS — N1832 Chronic kidney disease, stage 3b: Secondary | ICD-10-CM | POA: Diagnosis not present

## 2020-02-19 DIAGNOSIS — E1165 Type 2 diabetes mellitus with hyperglycemia: Secondary | ICD-10-CM

## 2020-02-19 DIAGNOSIS — I10 Essential (primary) hypertension: Secondary | ICD-10-CM | POA: Diagnosis not present

## 2020-02-19 DIAGNOSIS — R42 Dizziness and giddiness: Secondary | ICD-10-CM

## 2020-02-19 DIAGNOSIS — E1121 Type 2 diabetes mellitus with diabetic nephropathy: Secondary | ICD-10-CM

## 2020-02-19 DIAGNOSIS — IMO0002 Reserved for concepts with insufficient information to code with codable children: Secondary | ICD-10-CM

## 2020-02-19 NOTE — Progress Notes (Signed)
Subjective:    Patient ID: Stephen White, male    DOB: September 24, 1936, 84 y.o.   MRN: [id number]  Stephen White is a 84 y.o. male presenting on 02/19/2020 for Establish Care (dizziness)  Previous PCP Dr Rosanna Randy at [facility]  HPI  CHRONIC DM, Type 2 with CKD-III Reports concerns with Endocrinology [facility] [name] started him on Ozempic in the past dose up from 0.25 to 0.5 he had side effects with dizziness, nose bleeding, constipation and just overall did not feel well. Has not discussed or seen Nephro CBGs: Avg 180-200+, Low >100. Checks CBGs regularly with Freestyle Libre sensor Meds: Levemir now down to 15u daily, and Humalog 25 15 25  units wc Reports good compliance. Tolerating well w/o side-effects Currently on ARB Denies hypoglycemia, polyuria, visual changes, numbness or tingling.  CHRONIC HTN: Reports not checking BP regularly. Seems to have dizziness assoc with morning and taking meds, see below Current Meds - HCTZ 25mg  daily, Losartan 100mg  daily, Amlodipine 5mg  daily all at once in AM Reports good compliance, took meds today. Tolerating well, w/o complaints. Denies CP, dyspnea, HA, edema  Dizziness Describes lightheadedness and swimmy headed during or after breakfast. Not worse if move or stand up sudden. He is cautious about falling. See above, takes HTN meds all at once - Also takes Biotene, Daily MVI, ASA 81  Health Maintenance: UTD CoVID19 vaccine, awaiting record.  Depression screen [facility] 2/9 02/19/2020 12/18/2019 04/26/2019  Decreased Interest 0 0 0  Down, Depressed, Hopeless 0 0 0  PHQ - 2 Score 0 0 0    Past Medical History:  Diagnosis Date  . Allergy   . Chronic kidney disease   . GERD (gastroesophageal reflux disease)   . Hyperlipidemia   . Hypertension   . Thyroid disease    Past Surgical History:  Procedure Laterality Date  . EYE SURGERY     Social History   Socioeconomic History  . Marital status: Married    Spouse name: Not on file  .  Number of children: 1  . Years of education: Not on file  . Highest education level: 12th grade  Occupational History  . Occupation: retired  Tobacco Use  . Smoking status: Former Smoker    Packs/day: 0.50    Years: 15.00    Pack years: 7.50    Types: Cigarettes    Quit date: 12/19/1978    Years since quitting: 41.1  . Smokeless tobacco: Former Network engineer and Sexual Activity  . Alcohol use: No    Alcohol/week: 0.0 standard drinks  . Drug use: No  . Sexual activity: Not on file  Other Topics Concern  . Not on file  Social History Narrative  . Not on file   Social Determinants of Health   Financial Resource Strain: Low Risk   . Difficulty of Paying Living Expenses: Not hard at all  Food Insecurity: No Food Insecurity  . Worried About Charity fundraiser in the Last Year: Never true  . Ran Out of Food in the Last Year: Never true  Transportation Needs: No Transportation Needs  . Lack of Transportation (Medical): No  . Lack of Transportation (Non-Medical): No  Physical Activity: Inactive  . Days of Exercise per Week: 0 days  . Minutes of Exercise per Session: 0 min  Stress: No Stress Concern Present  . Feeling of Stress : Not at all  Social Connections: Somewhat Isolated  . Frequency of Communication with Friends and Family: Never  .  Frequency of Social Gatherings with Friends and Family: Never  . Attends Religious Services: More than 4 times per year  . Active Member of Clubs or Organizations: No  . Attends Archivist Meetings: Never  . Marital Status: Married  Human resources officer Violence: Not At Risk  . Fear of Current or Ex-Partner: No  . Emotionally Abused: No  . Physically Abused: No  . Sexually Abused: No   Family History  Problem Relation Age of Onset  . Diabetes Mother   . Diabetes Father   . Diabetes Brother   . Diabetes Sister   . Diabetes Sister   . Diabetes Brother   . Diabetes Brother   . Diabetes Brother   . Cancer Brother   .  Diabetes Brother    Current Outpatient Medications on File Prior to Visit  Medication Sig  . amLODipine (NORVASC) 5 MG tablet Take 5 mg by mouth at bedtime.  Marland Kitchen aspirin 81 MG tablet Take 81 mg by mouth daily.   . Biotin (BIOTIN 5000) 5 MG CAPS Take by mouth daily.  . Calcium-Magnesium-Zinc 333-133-5 MG TABS Take 1 tablet by mouth daily. Unsure dose  . Continuous Blood Gluc Receiver (FREESTYLE LIBRE 14 DAY READER) DEVI by Does not apply route.  . Emollient (GOLD BOND ULTIMATE) LOTN Apply topically at bedtime. Diabetic lotion  . famotidine (PEPCID) 20 MG tablet TAKE 1 TABLET(20 MG) BY MOUTH TWICE DAILY  . hydrochlorothiazide (HYDRODIURIL) 25 MG tablet Take 12.5 mg by mouth daily. In AM (half dose)  . Insulin Detemir (LEVEMIR) 100 UNIT/ML Pen Inject 30-35 Units into the skin. 35 units in AM and 30 units in PM - depending on BS reading  . Insulin Lispro (HUMALOG KWIKPEN [place]) Inject into the skin as directed. As needed depending on BS readings twice daily Prescribed as 25 in AM, 15 at dinner and 25 at supper.  . loratadine (ALLERGY RELIEF) 10 MG tablet Take 10 mg by mouth daily as needed for allergies.  Marland Kitchen losartan (COZAAR) 100 MG tablet Take 1 tablet (100 mg total) by mouth daily.  . MULTIPLE VITAMIN PO Take by mouth daily.   . ONE TOUCH ULTRA TEST test strip 1 each by Other route as needed.   . pravastatin (PRAVACHOL) 10 MG tablet TAKE 1 TABLET BY MOUTH EVERY DAY  . Carboxymethylcellulose Sodium (EYE DROPS OP) Apply to eye.  Marland Kitchen ibuprofen (ADVIL,MOTRIN) 600 MG tablet TAKE 1 TABLET(600 MG) BY MOUTH THREE TIMES DAILY AS NEEDED (Patient not taking: Reported on 12/18/2019)  . omeprazole (PRILOSEC) 20 MG capsule Take 20 mg by mouth daily.   No current facility-administered medications on file prior to visit.    Review of Systems Per HPI unless specifically indicated above      Objective:    BP 110/60 (BP Location: Left Arm, Cuff Size: Normal)   Pulse 68   Temp (!) 97.5 F (36.4 C) (Temporal)    Resp 16   Ht 5\' 7"  (1.702 m)   Wt 190 lb (86.2 kg)   BMI 29.76 kg/m   Wt Readings from Last 3 Encounters:  02/19/20 190 lb (86.2 kg)  04/26/19 194 lb 12.8 oz (88.4 kg)  11/06/18 198 lb (89.8 kg)    Physical Exam Vitals and nursing note reviewed.  Constitutional:      General: He is not in acute distress.    Appearance: He is well-developed. He is not diaphoretic.     Comments: Well-appearing, comfortable, cooperative  HENT:     Head: Normocephalic  and atraumatic.     Comments: Poor hearing, has hearing aids bilateral.    Right Ear: Tympanic membrane, ear canal and external ear normal.     Left Ear: Tympanic membrane, ear canal and external ear normal.  Eyes:     General:        Right eye: No discharge.        Left eye: No discharge.     Conjunctiva/sclera: Conjunctivae normal.  Neck:     Thyroid: No thyromegaly.  Cardiovascular:     Rate and Rhythm: Normal rate and regular rhythm.     Heart sounds: Normal heart sounds. No murmur.  Pulmonary:     Effort: Pulmonary effort is normal. No respiratory distress.     Breath sounds: Normal breath sounds. No wheezing or rales.  Musculoskeletal:        General: Normal range of motion.     Cervical back: Normal range of motion and neck supple.  Lymphadenopathy:     Cervical: No cervical adenopathy.  Skin:    General: Skin is warm and dry.     Findings: No erythema or rash.  Neurological:     Mental Status: He is alert and oriented to person, place, and time.  Psychiatric:        Behavior: Behavior normal.     Comments: Well groomed, good eye contact, normal speech and thoughts       Results for orders placed or performed in visit on 01/10/20  Hemoglobin A1c  Result Value Ref Range   Hemoglobin A1C 8.5       Assessment & Plan:   Problem List Items Addressed This Visit    Uncontrolled type 2 diabetes mellitus with hyperglycemia ([place]) - Primary   Essential (primary) hypertension   Chronic kidney disease      #DM2,  uncontrolled Complicated by DM Nephropathy CKD III, Hyperglycemia - he is off Ozempic self discontinued now - I advised that he should be at least back on prior dose Levemir for basal dosing, 35u am and 30 u pm, instead of lower dose at 15, because he lowered insulin when switched to ozempic, now he is off ozempic, therefore needs to resume pre-ozempic insulin dosing - he can follow up soon w. [name] to discuss insulin dosing. - Offered in future maybe other meds may be better tolerated as add ons such as Rybelsus or Januvia, can review w/ Endocrinology  #HTN / Dizziness Clinically well appearing, no significant history or finding of vertigo or neuro deficits, seems to be localized dizziness vs lightheaded usually in AM around breakfast time, seems most linked to his HTN medication - Has been on same meds for while. Not checking BP at home - Today repeat BP is much lower than automated cuff - Advised to start we can try reduce HCTZ in HALF from 25 daily down to 12.5mg  AM daily and switch Amlodipine 5mg  from AM to PM dosing. Continue Losartan 100mg  daily. New instructions written. No new rx sent yet. - Keep track of BP and dizziness - Follow-up as needed or within 3 months   No orders of the defined types were placed in this encounter.    Follow up plan: Return in about 3 months (around 05/21/2020) for 3 month HTN, dizziness, med adjust, updates DM [facility] Endocrine).  Nobie Putnam, DO [facility] 02/19/2020, 3:37 PM

## 2020-02-19 NOTE — Patient Instructions (Addendum)
Thank you for coming to the office today.  Please resume previous insulin regimen:  Levemir insulin 35 units in morning and 30 units in evening NovoLog insulin 25 units before breakfast and 15-20 units before supper  You do have declining kidney function, Chronic Kidney Disease Stage IV  You can check back with [name] to determine if she would like to change your insulin further. And also can consider Januvia as an option, or Rybelsus (may be more mild, even though it is the same chemical as the Ozempic)  BP is lower when I rechecked it. Your dizziness may be due to medications.  Try CUTTING Hydrochlorothiazide 25mg  in HALF for dose 12.5mg  in MORNING.  Switch Amlodipine 5mg  to EVENING PM dose, instead of morning.  Continue Losartan 100mg  daily in morning.  Please schedule a Follow-up Appointment to: Return in about 3 months (around 05/21/2020) for 3 month HTN, dizziness, med adjust, updates DM [facility] Endocrine).  If you have any other questions or concerns, please feel free to call the office or send a message through [place]. You may also schedule an earlier appointment if necessary.  Additionally, you may be receiving a survey about your experience at our office within a few days to 1 week by e-mail or mail. We value your feedback.  Nobie Putnam, DO [place]

## 2020-03-06 LAB — COMPREHENSIVE METABOLIC PANEL: GFR calc non Af Amer: 25

## 2020-03-06 LAB — BASIC METABOLIC PANEL
BUN: 43 — AB (ref 4–21)
Creatinine: 2.5 — AB (ref 0.6–1.3)

## 2020-03-06 LAB — HEMOGLOBIN A1C: Hemoglobin A1C: 9.2

## 2020-04-01 ENCOUNTER — Encounter: Payer: Self-pay | Admitting: Family Medicine

## 2020-04-01 ENCOUNTER — Ambulatory Visit (INDEPENDENT_AMBULATORY_CARE_PROVIDER_SITE_OTHER): Payer: Medicare HMO | Admitting: Family Medicine

## 2020-04-01 ENCOUNTER — Other Ambulatory Visit: Payer: Self-pay

## 2020-04-01 VITALS — BP 125/50 | HR 72 | Temp 97.3°F | Ht 67.0 in | Wt 189.8 lb

## 2020-04-01 DIAGNOSIS — I10 Essential (primary) hypertension: Secondary | ICD-10-CM

## 2020-04-01 DIAGNOSIS — R0600 Dyspnea, unspecified: Secondary | ICD-10-CM

## 2020-04-01 DIAGNOSIS — R42 Dizziness and giddiness: Secondary | ICD-10-CM | POA: Diagnosis not present

## 2020-04-01 DIAGNOSIS — R55 Syncope and collapse: Secondary | ICD-10-CM | POA: Diagnosis not present

## 2020-04-01 DIAGNOSIS — R0609 Other forms of dyspnea: Secondary | ICD-10-CM

## 2020-04-01 NOTE — Patient Instructions (Addendum)
Thank you for coming to the office today.  I am concerned that your kidney function is reduced still, and I am worried this could be contributing. See if your Diabetes specialist can help recheck this, otherwise if you need [place] to check kidneys we can do this soon.  Referral to Cardiologist   [facility]) HeartCare at [place], [place] [address] Main: [phone]   Stay tuned for apt coming up. This is to evaluate your heart with the shortness of breath and variety of symptoms you are experiencing.  ------ Head CT scan ordered, stay tuned we will get it approved and scheduled.  [facility] - Radiology  Address: [address] [place], [place], [place] [address] Phone: [phone]  If you have any significant chest pain that does not go away within 30 minutes, is accompanied by nausea, sweating, shortness of breath, or made worse by activity, this may be evidence of a heart attack, especially if symptoms worsening instead of improving, please call 911 or go directly to the emergency room immediately for evaluation.  If you have severe headache, loss of vision, weakness in arm or leg, slurred speech, stroke symptoms, pass out then seek care immediately call 911 or go to emergency room   Please schedule a Follow-up Appointment to: Return in about 2 weeks (around 04/15/2020), or if symptoms worsen or fail to improve, for shortness of breath, lightheadedness, BP.  If you have any other questions or concerns, please feel free to call the office or send a message through [place]. You may also schedule an earlier appointment if necessary.  Additionally, you may be receiving a survey about your experience at our office within a few days to 1 week by e-mail or mail. We value your feedback.  Nobie Putnam, DO [place]

## 2020-04-01 NOTE — Progress Notes (Signed)
Subjective:    Patient ID: Stephen White, male    DOB: 1936-01-29, 84 y.o.   MRN: [id number]  Stephen White is a 84 y.o. male presenting on 04/01/2020 for Headache (per patient he has been feeling light headed for a couple of weeks. Pt also has SOB when doing little to no movement) and Extremity Weakness (3-4 days patient's legs have been shaking while standing)  Patient presents for a same day appointment.   HPI  Episodic Lightheadedness / Dizziness / Dyspnea on Exertion /Generalized Weakness  Previous visit with me for new patient evaluation on 02/19/20 discussed some concerns of lightheadedness and dizziness. Seemed to be provoked or predictable. His BP meds were changed as he has had some lower BP. He was reduced on HCTZ by half from 25 down to 12.5mg  and Amlodipine switched to PM dosing instead of AM.  Today he reports no significant improvement in HTN med changes. He is adhering to meds. Has monitored BP.  He is concerned about a variety of symptoms that have gradually worsened without improvement now with some headache associated with lightheadedness dizzy feeling still. Not always predictable.  He has bilateral lower extremity legs feeling somewhat weaker, some shaking if standing too long. He does not endorse one leg or arm weaker than other or giving out or passing out.  He has worsening shortness of breath, less able to tolerate mild exertion than he used to.  Additionally admits episodic Left sided chest pain - says very mild, Not active currently. Not always exertional  Previously saw Dr Serafina Royals Cardiology at [facility] - had negative work up by his report, but do not see any records in careeverywhere   Additional PMH - Type 2 DM uncontrolled, however recent sugars have improved. Back on insulin regimen, off Ozempic.  Known CKD-IV with some slight progression has been relatively stable >4+ years. Based on results Creatinine GFR < 30. - Last lab done in March 2021, he will  return to Endocrinology soon. - Last A1c 9.2 (03/06/20) - Now significant improved avg CBG 80-150s   Depression screen [facility] 2/9 04/01/2020 02/19/2020 12/18/2019  Decreased Interest 0 0 0  Down, Depressed, Hopeless 0 0 0  PHQ - 2 Score 0 0 0    Social History   Tobacco Use  . Smoking status: Former Smoker    Packs/day: 0.30    Years: 25.00    Pack years: 7.50    Types: Cigarettes    Quit date: 12/19/1978    Years since quitting: 41.3  . Smokeless tobacco: Former Network engineer Use Topics  . Alcohol use: No    Alcohol/week: 0.0 standard drinks  . Drug use: No    Review of Systems Per HPI unless specifically indicated above     Objective:    BP (!) 125/50   Pulse 72   Temp (!) 97.3 F (36.3 C) (Temporal)   Ht 5\' 7"  (1.702 m)   Wt 189 lb 12.8 oz (86.1 kg)   SpO2 96%   BMI 29.73 kg/m   Wt Readings from Last 3 Encounters:  04/01/20 189 lb 12.8 oz (86.1 kg)  02/19/20 190 lb (86.2 kg)  04/26/19 194 lb 12.8 oz (88.4 kg)    Physical Exam Vitals and nursing note reviewed.  Constitutional:      General: He is not in acute distress.    Appearance: He is well-developed. He is not diaphoretic.     Comments: Well-appearing, comfortable, cooperative  HENT:  Head: Normocephalic and atraumatic.  Eyes:     General:        Right eye: No discharge.        Left eye: No discharge.     Conjunctiva/sclera: Conjunctivae normal.  Neck:     Thyroid: No thyromegaly.  Cardiovascular:     Rate and Rhythm: Normal rate and regular rhythm.     Heart sounds: Normal heart sounds. No murmur.  Pulmonary:     Effort: Pulmonary effort is normal. No respiratory distress.     Breath sounds: Normal breath sounds. No wheezing or rales.  Musculoskeletal:        General: Normal range of motion.     Cervical back: Normal range of motion and neck supple.  Lymphadenopathy:     Cervical: No cervical adenopathy.  Skin:    General: Skin is warm and dry.     Findings: No erythema or rash.    Neurological:     Mental Status: He is alert and oriented to person, place, and time.     Cranial Nerves: No cranial nerve deficit, dysarthria or facial asymmetry.     Sensory: No sensory deficit.     Motor: No weakness.  Psychiatric:        Behavior: Behavior normal.     Comments: Well groomed, good eye contact, normal speech and thoughts      Recent Labs    11/30/19 0000  HGBA1C 8.5     Results for orders placed or performed in visit on 01/10/20  Hemoglobin A1c  Result Value Ref Range   Hemoglobin A1C 8.5       Assessment & Plan:   Problem List Items Addressed This Visit    Essential (primary) hypertension   Relevant Orders   Ambulatory referral to Cardiology   Episodic lightheadedness - Primary   Relevant Orders   CT Head Wo Contrast   Ambulatory referral to Cardiology    Other Visit Diagnoses    Near syncope       Relevant Orders   CT Head Wo Contrast   Ambulatory referral to Cardiology   Dyspnea on exertion       Relevant Orders   Ambulatory referral to Cardiology      Constellation of symptoms concerning at this time, no acute focal neurological change, therefore will not pursue STAT order or send to ED at this time. Will pursue outpatient work up with some CT head imaging and further diagnostic eval from Cardiology for ischemic work up.  Hemodynamically stable, already on reduced BP meds, no significant change to his lightheadedness/dizziness. Unclear etiology therefore will rule out other neurological causes with Head CT as next step.  Has been managed by Endocrinology for DM2, now improving control Progressive CKD-IV chronic issue for years now, seems less likely acute cause, but it is concerning and may be contributing.  No change to BP medications today.  Goal is pursue diagnostic testing at this time.  Consider repeat lab soon, however he has upcoming apt with Endocrinology.  Advised strict precautions when to go to hospital ED if worsening  headache, stroke symptoms, syncope, chest pain dyspnea.   Orders Placed This Encounter  Procedures  . CT Head Wo Contrast    Standing Status:   Future    Standing Expiration Date:   07/01/2021    Order Specific Question:   ** REASON FOR EXAM (FREE TEXT)    Answer:   episodic lightheadedness / dizziness, rule out vascular cause  Order Specific Question:   Preferred imaging location?    Answer:   [facility]    Order Specific Question:   Radiology Contrast Protocol - do NOT remove file path    Answer:   [email or web address]  . Hemoglobin A1c    This external order was created through the Results Console.  . Basic metabolic panel    This external order was created through the Results Console.  . Comprehensive metabolic panel    This external order was created through the Results Console.  . Ambulatory referral to Cardiology    Referral Priority:   Routine    Referral Type:   Consultation    Referral Reason:   Specialty Services Required    Requested Specialty:   Cardiology    Number of Visits Requested:   1      No orders of the defined types were placed in this encounter.     Follow up plan: Return in about 2 weeks (around 04/15/2020), or if symptoms worsen or fail to improve, for shortness of breath, lightheadedness, BP.   Nobie Putnam, [facility] 04/01/2020, 4:30 PM

## 2020-04-03 ENCOUNTER — Ambulatory Visit: Payer: Medicare HMO | Admitting: Cardiovascular Disease

## 2020-04-14 ENCOUNTER — Telehealth: Payer: Self-pay | Admitting: Family Medicine

## 2020-04-14 ENCOUNTER — Ambulatory Visit
Admission: RE | Admit: 2020-04-14 | Discharge: 2020-04-14 | Disposition: A | Discharge status: Home / Self Care | Payer: Medicare HMO | Source: Ambulatory Visit | Attending: Family Medicine | Admitting: Family Medicine

## 2020-04-14 ENCOUNTER — Other Ambulatory Visit: Payer: Self-pay

## 2020-04-14 DIAGNOSIS — R42 Dizziness and giddiness: Secondary | ICD-10-CM | POA: Diagnosis present

## 2020-04-14 DIAGNOSIS — R55 Syncope and collapse: Secondary | ICD-10-CM | POA: Diagnosis present

## 2020-04-14 NOTE — Telephone Encounter (Signed)
Patients wife called and was read CT of head note by Dr Parks Ranger today 04/14/20.  She verbalized understanding of all information.

## 2020-04-14 NOTE — Progress Notes (Signed)
Unable to reach the patient CRM message send.

## 2020-04-16 ENCOUNTER — Other Ambulatory Visit: Payer: Self-pay | Admitting: Family Medicine

## 2020-04-16 DIAGNOSIS — K219 Gastro-esophageal reflux disease without esophagitis: Secondary | ICD-10-CM

## 2020-04-22 ENCOUNTER — Encounter: Payer: Self-pay | Admitting: Cardiovascular Disease

## 2020-04-22 ENCOUNTER — Ambulatory Visit (INDEPENDENT_AMBULATORY_CARE_PROVIDER_SITE_OTHER): Payer: Medicare HMO | Admitting: Cardiovascular Disease

## 2020-04-22 ENCOUNTER — Other Ambulatory Visit: Payer: Self-pay

## 2020-04-22 VITALS — BP 122/60 | HR 70 | Ht 67.0 in | Wt 190.5 lb

## 2020-04-22 DIAGNOSIS — R42 Dizziness and giddiness: Secondary | ICD-10-CM

## 2020-04-22 DIAGNOSIS — I10 Essential (primary) hypertension: Secondary | ICD-10-CM | POA: Diagnosis not present

## 2020-04-22 DIAGNOSIS — E1165 Type 2 diabetes mellitus with hyperglycemia: Secondary | ICD-10-CM | POA: Diagnosis not present

## 2020-04-22 DIAGNOSIS — J449 Chronic obstructive pulmonary disease, unspecified: Secondary | ICD-10-CM

## 2020-04-22 DIAGNOSIS — R29898 Other symptoms and signs involving the musculoskeletal system: Secondary | ICD-10-CM

## 2020-04-22 NOTE — Progress Notes (Signed)
Cardiology Office Note  Date:  04/22/2020   ID:  Stephen White, DOB 08-Feb-1936, MRN [id number]  PCP:  Olin Hauser, DO   Chief Complaint  Patient presents with  . other    Ref by Dr. Parks Ranger for near syncope, lightheadedness & HTN. Meds reviewed by the pt. verbally. Pt. c/o shortness of breath and chest pain.     HPI:  [name] is a 84 year old gentleman with past medical history of Diabetes type 2, Last A1c 9.2 (03/06/20) CKD-IV,  GFR 25., CR 2.5, up from 2./1 six years ago Former smoker, 25 years Who presents by referral from Dr. Parks Ranger for dizziness, leg weakness  Per notes from primary care, he reports having feelings of light headness for a couple of weeks. Pt also has SOB and extremity Weakness   Medications were reduced with no significant improvement in symptoms reduced on HCTZ by half from 25 down to 12.5mg  and Amlodipine switched to PM dosing instead of AM.  some headache associated with lightheadedness dizzy feeling still. "not dizziness", head or neck feels weak   Brain CT 1. Negative for bleed or other acute intracranial process. 2. Atrophy and nonspecific white matter changes.  On further discussion, reports he has been very sedentary over the past year particularly this past winter [place], Plans on doing mowing and bush hogging  Legs weak past 2 months,  If he stands in one place they start to shake  Sometimes will depend on what he is done Able to walk longer distances, no leg shaking, no significant shortness of breath when he walks Hips hurt with long walks Able to walk from parking lot to medical mall to the medical arts building without stopping, no shortness of breath or leg weakness  Wife "is crippled" He has to do the house chores Gets the groceries, "I do fine" Wife does the driving, he does not drive, "my eyes"  With walking, no chest pain, no SOB  Does not feel he is having dizziness episodes per se,  just feels that his neck is weak at times, head feels unsteady  EKG personally reviewed by myself on todays visit NSR rate 70 bpm, PAC   PMH:   has a past medical history of Allergy, Chronic kidney disease, GERD (gastroesophageal reflux disease), Hyperlipidemia, Hypertension, and Thyroid disease.  PSH:    Past Surgical History:  Procedure Laterality Date  . EYE SURGERY      Current Outpatient Medications  Medication Sig Dispense Refill  . amLODipine (NORVASC) 5 MG tablet Take 5 mg by mouth at bedtime.    Marland Kitchen aspirin 81 MG tablet Take 81 mg by mouth daily.     . Biotin (BIOTIN 5000) 5 MG CAPS Take by mouth daily.    . Calcium-Magnesium-Zinc 333-133-5 MG TABS Take 1 tablet by mouth daily. Unsure dose    . Carboxymethylcellulose Sodium (EYE DROPS OP) Apply to eye.    . Continuous Blood Gluc Receiver (FREESTYLE LIBRE 14 DAY READER) DEVI by Does not apply route.    . Emollient (GOLD BOND ULTIMATE) LOTN Apply topically at bedtime. Diabetic lotion    . famotidine (PEPCID) 20 MG tablet TAKE 1 TABLET(20 MG) BY MOUTH TWICE DAILY 180 tablet 0  . hydrochlorothiazide (HYDRODIURIL) 25 MG tablet Take 12.5 mg by mouth daily. In AM (half dose)    . ibuprofen (ADVIL,MOTRIN) 600 MG tablet TAKE 1 TABLET(600 MG) BY MOUTH THREE TIMES DAILY AS NEEDED 270 tablet 1  . insulin aspart (  NOVOLOG) 100 UNIT/ML FlexPen Inject 25 units BEFORE breakfast and supper. Inject 15 units BEFORE lunch.    . Insulin Detemir (LEVEMIR) 100 UNIT/ML Pen Inject 30-35 Units into the skin. 35 units in AM and 30 units in PM - depending on BS reading    . Insulin Lispro (HUMALOG KWIKPEN [place]) Inject into the skin as directed. As needed depending on BS readings twice daily Prescribed as 25 in AM, 15 at dinner and 25 at supper.    . loratadine (ALLERGY RELIEF) 10 MG tablet Take 10 mg by mouth daily as needed for allergies.    Marland Kitchen losartan (COZAAR) 100 MG tablet Take 1 tablet (100 mg total) by mouth daily. 90 tablet 3  . MULTIPLE VITAMIN PO  Take by mouth daily.     Marland Kitchen omeprazole (PRILOSEC) 20 MG capsule Take 20 mg by mouth daily.    . ONE TOUCH ULTRA TEST test strip 1 each by Other route as needed.     . pravastatin (PRAVACHOL) 10 MG tablet TAKE 1 TABLET BY MOUTH EVERY DAY 90 tablet 3   No current facility-administered medications for this visit.     Allergies:   Prednisone   Social History:  The patient  reports that he quit smoking about 41 years ago. His smoking use included cigarettes. He has a 7.50 pack-year smoking history. He has quit using smokeless tobacco. He reports that he does not drink alcohol or use drugs.   Family History:   family history includes Cancer in his brother; Diabetes in his brother, brother, brother, brother, brother, father, mother, sister, and sister; Stroke in his father.    Review of Systems: Review of Systems  Constitutional: Negative.   HENT: Negative.   Eyes: Positive for blurred vision.  Respiratory: Negative.   Cardiovascular: Negative.   Gastrointestinal: Negative.   Musculoskeletal: Negative.        Leg weakness when standing in 1 place  Neurological: Negative.   Psychiatric/Behavioral: Negative.   All other systems reviewed and are negative.   PHYSICAL EXAM: VS:  BP 122/60 (BP Location: Right Arm, Patient Position: Sitting, Cuff Size: Normal)   Pulse 70   Ht 5\' 7"  (1.702 m)   Wt 190 lb 8 oz (86.4 kg)   SpO2 98%   BMI 29.84 kg/m  , BMI Body mass index is 29.84 kg/m. GEN: Well nourished, well developed, in no acute distress HEENT: normal Neck: no JVD, carotid bruits, or masses Cardiac: RRR; no murmurs, rubs, or gallops,no edema  Respiratory:  clear to auscultation bilaterally, normal work of breathing GI: soft, nontender, nondistended, + BS MS: no deformity or atrophy Skin: warm and dry, no rash Neuro:  Strength and sensation are intact Psych: euthymic mood, full affect  Recent Labs: 03/06/2020: BUN 43; Creatinine 2.5    Lipid Panel Lab Results  Component  Value Date   CHOL 162 11/07/2018   HDL 54 11/07/2018   LDLCALC 92 11/07/2018   TRIG 78 11/07/2018      Wt Readings from Last 3 Encounters:  04/22/20 190 lb 8 oz (86.4 kg)  04/01/20 189 lb 12.8 oz (86.1 kg)  02/19/20 190 lb (86.2 kg)     ASSESSMENT AND PLAN:  Problem List Items Addressed This Visit      Cardiology Problems   Essential (primary) hypertension     Other   Uncontrolled type 2 diabetes mellitus with hyperglycemia (HCC) - Primary   Relevant Medications   insulin aspart (NOVOLOG) 100 UNIT/ML FlexPen   CAFL (chronic  airflow limitation) ([place])    Other Visit Diagnoses    Dizziness       Weakness of lower extremity, unspecified laterality         Diabetes type 2 poorly controlled Stressed importance of aggressive diet, lifestyle modification Working with endocrinology Has been very sedentary through the winter, recommended regular walking program  Leg weakness/tremor Able to walk long distances " okay" Seems to happen more when he has muscle fatigue then tries to stand, has to sit down Likely secondary to conditioning Recommend a regular walking program for conditioning, has been very sedentary through the winter  Dizziness Atypical, he does not feel he is getting dizzy only has some neck weakness extending into his head, feels like he needs to drop his head to the side Does not seem to be a cardiac etiology, less likely arrhythmia  Hypertension Orthostatics checked today, No significant drop in numbers with standing No medication changes made   Disposition:   F/U as needed   Total encounter time more than 45 minutes  Greater than 50% was spent in counseling and coordination of care with the patient    Signed, Esmond Plants, M.D., Ph.D. [place], [place]

## 2020-04-22 NOTE — Patient Instructions (Signed)

## 2020-05-13 ENCOUNTER — Other Ambulatory Visit: Payer: Self-pay | Admitting: Family Medicine

## 2020-05-13 ENCOUNTER — Encounter: Payer: Self-pay | Admitting: Family Medicine

## 2020-05-13 ENCOUNTER — Ambulatory Visit (INDEPENDENT_AMBULATORY_CARE_PROVIDER_SITE_OTHER): Payer: Medicare HMO | Admitting: Family Medicine

## 2020-05-13 ENCOUNTER — Other Ambulatory Visit: Payer: Self-pay

## 2020-05-13 VITALS — BP 138/55 | HR 58 | Temp 97.1°F | Resp 16 | Ht 67.0 in | Wt 194.0 lb

## 2020-05-13 DIAGNOSIS — R42 Dizziness and giddiness: Secondary | ICD-10-CM | POA: Diagnosis not present

## 2020-05-13 DIAGNOSIS — R29898 Other symptoms and signs involving the musculoskeletal system: Secondary | ICD-10-CM

## 2020-05-13 DIAGNOSIS — I10 Essential (primary) hypertension: Secondary | ICD-10-CM

## 2020-05-13 DIAGNOSIS — I129 Hypertensive chronic kidney disease with stage 1 through stage 4 chronic kidney disease, or unspecified chronic kidney disease: Secondary | ICD-10-CM

## 2020-05-13 DIAGNOSIS — E1169 Type 2 diabetes mellitus with other specified complication: Secondary | ICD-10-CM

## 2020-05-13 DIAGNOSIS — E785 Hyperlipidemia, unspecified: Secondary | ICD-10-CM

## 2020-05-13 DIAGNOSIS — Z0000 Encounter for general adult medical examination without abnormal findings: Secondary | ICD-10-CM

## 2020-05-13 DIAGNOSIS — R351 Nocturia: Secondary | ICD-10-CM

## 2020-05-13 DIAGNOSIS — N184 Chronic kidney disease, stage 4 (severe): Secondary | ICD-10-CM

## 2020-05-13 DIAGNOSIS — E1165 Type 2 diabetes mellitus with hyperglycemia: Secondary | ICD-10-CM

## 2020-05-13 NOTE — Patient Instructions (Addendum)
Thank you for coming to the office today.  Since Dr Rockey Situ has already evaluated your heart, and there was no new concern. If you have more episodes of palpitations - call his office back.  I would recommend gradual improving your activity and see if that helps your lower extremities to limit shaking, it could be muscle weakness in legs at times.  Next step if not resolving or still having episodes of lightheaded or other concerns  I would recommend a referral to a Neurologist - at [facility] - let me know if you would like referral.  Maybe more than arthritis in back, may be pinched nerve or neuropathy affecting you - can cause tremors in legs as well, can discuss with Neurologist in future about MRI.  DUE for FASTING BLOOD WORK (no food or drink after midnight before the lab appointment, only water or coffee without cream/sugar on the morning of)  SCHEDULE "Lab Only" visit in the morning at the clinic for lab draw in 3 MONTHS   - Make sure Lab Only appointment is at about 1 week before your next appointment, so that results will be available  For Lab Results, once available within 2-3 days of blood draw, you can can log in to MyChart online to view your results and a brief explanation. Also, we can discuss results at next follow-up visit.    Please schedule a Follow-up Appointment to: Return in about 3 months (around 08/13/2020) for Annual Physical.  If you have any other questions or concerns, please feel free to call the office or send a message through [facility]. You may also schedule an earlier appointment if necessary.  Additionally, you may be receiving a survey about your experience at our office within a few days to 1 week by e-mail or mail. We value your feedback.  Nobie Putnam, DO [place]

## 2020-05-13 NOTE — Progress Notes (Signed)
Subjective:    Patient ID: Stephen White, male    DOB: [birth year], 84 y.o.   MRN: [id number]  Stephen White is a 84 y.o. male presenting on 05/13/2020 for Hypertension and Palpitations (just today and he could not sleep at night and had some palpitation in the morning)   HPI  Episodic Lightheadedness / Generalized Weakness vs Tremors in Lower Extremity - Last visit with me for same problem, on 04/01/20 treated with lab panel and referral to Cardiology and Head CT, see prior notes for background information. - Interval update with reviewed head CT negative for acute change or stroke, showed some chronic white matter changes, labs unremarkable - Saw Dr Rockey Situ on 04/22/20 evaluation done, ultimately thought his episodes and symptoms unrelated to his heart. He has since had a palpitation episode but did not discuss this with Dr Rockey Situ - it has resolved - Today patient reports same problem, seems unresolved, no worse or better. But has same issue with activity and prolonged activity standing he will get issue with lower extremity tremors and shaking at times. He feels like he needs to stop and rest at times. - He has episodic lightheadedness without obvious dizziness. He had prior heavy injury 50+ years ago, lifting a log and injured his back, has had pain ever since. He was told by prior PCP that he had arthritis but he doesn't agree with this says that he probably does have arthritis but he is concerned about nerves. He did PT in past, and they said he may have some nerve issues.  BP is improved. No concern with low BP  He has bilateral lower extremity legs feeling somewhat weaker, some shaking if standing too long. He does not endorse one leg or arm weaker than other or giving out or passing out.  No longer endorsing chest pain or dyspnea with exertion   Additional PMH - Type 2 DM uncontrolled, followed by [facility]  Known CKD-IV with some slight progression has been  relatively stable >4+ years. Based on results Creatinine GFR < 30. - Last lab done in March 2021, he will return to Endocrinology soon. - Last A1c 9.2 (03/06/20) - Now significant improved avg CBG 80-150s   Depression screen [facility] 2/9 04/01/2020 02/19/2020 12/18/2019  Decreased Interest 0 0 0  Down, Depressed, Hopeless 0 0 0  PHQ - 2 Score 0 0 0    Social History   Tobacco Use  . Smoking status: Former Smoker    Packs/day: 0.30    Years: 25.00    Pack years: 7.50    Types: Cigarettes    Quit date: 12/19/1978    Years since quitting: 41.4  . Smokeless tobacco: Former Network engineer Use Topics  . Alcohol use: No    Alcohol/week: 0.0 standard drinks  . Drug use: No    Review of Systems Per HPI unless specifically indicated above     Objective:    BP (!) 138/55   Pulse (!) 58   Temp (!) 97.1 F (36.2 C) (Temporal)   Resp 16   Ht 5\' 7"  (1.702 m)   Wt 194 lb (88 kg)   SpO2 100%   BMI 30.38 kg/m   Wt Readings from Last 3 Encounters:  05/13/20 194 lb (88 kg)  04/22/20 190 lb 8 oz (86.4 kg)  04/01/20 189 lb 12.8 oz (86.1 kg)    Physical Exam Vitals and nursing note reviewed.  Constitutional:      General: He is  not in acute distress.    Appearance: He is well-developed. He is not diaphoretic.     Comments: Well-appearing, comfortable, cooperative  HENT:     Head: Normocephalic and atraumatic.  Eyes:     General:        Right eye: No discharge.        Left eye: No discharge.     Conjunctiva/sclera: Conjunctivae normal.  Neck:     Thyroid: No thyromegaly.  Cardiovascular:     Rate and Rhythm: Normal rate and regular rhythm.     Heart sounds: Normal heart sounds. No murmur.     Comments: No ectopy Pulmonary:     Effort: Pulmonary effort is normal. No respiratory distress.     Breath sounds: Normal breath sounds. No wheezing or rales.  Musculoskeletal:        General: Normal range of motion.     Cervical back: Normal range of motion and neck supple.     Right  lower leg: No edema.     Left lower leg: No edema.     Comments: Upper / Lower Extremities: - Normal muscle tone, strength bilateral upper extremities 5/5, lower extremities 5/5 - Bilateral shoulders, knees, wrist, ankles without deformity, tenderness, effusion - Normal Gait   Lymphadenopathy:     Cervical: No cervical adenopathy.  Skin:    General: Skin is warm and dry.     Findings: No erythema or rash.  Neurological:     Mental Status: He is alert and oriented to person, place, and time.  Psychiatric:        Behavior: Behavior normal.     Comments: Well groomed, good eye contact, normal speech and thoughts      I have personally reviewed the radiology report from 04/14/20 - Head CT  CLINICAL DATA:  Dizziness  EXAM: CT HEAD WITHOUT CONTRAST  TECHNIQUE: Contiguous axial images were obtained from the base of the skull through the vertex without intravenous contrast.  COMPARISON:  None.  FINDINGS: Brain: Diffuse parenchymal atrophy. Patchy areas of hypoattenuation in deep and periventricular white matter bilaterally. Prominent arachnoid cyst in the posterior cranial fossa on the left. Negative for acute intracranial hemorrhage, mass lesion, acute infarction, midline shift, or mass-effect. Acute infarct may be inapparent on noncontrast CT. Ventricles and sulci symmetric.  Vascular: No hyperdense vessel or unexpected calcification.  Skull: Normal. Negative for fracture or focal lesion.  Sinuses/Orbits: No acute finding.  Other: None  IMPRESSION: 1. Negative for bleed or other acute intracranial process. 2. Atrophy and nonspecific white matter changes.   Electronically Signed   By: Lucrezia Europe M.D.   On: 04/14/2020 09:02  --------------  I have personally reviewed the radiology report from 01/25/18 Lumbar Spine.  CLINICAL DATA:  Low back pain for the past 2-3 weeks.  No injury.  EXAM: LUMBAR SPINE - COMPLETE 4+ VIEW  COMPARISON:   None.  FINDINGS: Five lumbar type vertebral bodies. No acute fracture or subluxation. Vertebral body heights are preserved. Slight dextrocurvature of the lumbar spine. Trace stepwise retrolisthesis at L2-L3 and L3-L4. Moderate to severe disc height loss and endplate spurring at Y3-K1 and L5-S1. Mild lumbar facet arthropathy. The sacroiliac joints are intact.  IMPRESSION: Moderate to severe degenerative disc disease of the lower lumbar spine.   Electronically Signed   By: Titus Dubin M.D.   On: 01/26/2018 08:17  Results for orders placed or performed in visit on 04/01/20  Hemoglobin A1c  Result Value Ref Range   Hemoglobin A1C  9.2   Basic metabolic panel  Result Value Ref Range   BUN 43 (A) 4 - 21   Creatinine 2.5 (A) 0.6 - 1.3  Comprehensive metabolic panel  Result Value Ref Range   GFR calc non Af Amer 25       Assessment & Plan:   Problem List Items Addressed This Visit    Essential (primary) hypertension - Primary   Episodic lightheadedness    Other Visit Diagnoses    Weakness of both lower extremities          Persistent problems, without worsening or improvement Reassuring Head CT without acute problem. Likely some component related to gradual atrophy Seems to describe mostly neurologically mediated symptoms at this point with lower extremity tremors with activity - may be weakness deconditioning - Also prior history spinal issue with heavy lifting injury in past, question if his spinal Lumbar DJD is contributing to any nerve impingement or symptoms affecting his legs  Reassurance with recent cardiac work up Dr Rockey Situ, less likely cardiac. Reviewed palpitation episode, self limited if recurrence can return to Cardiology if needed  Hemodynamically stable, already on reduced BP meds, no significant change to his lightheadedness/dizziness.   He will work on re-conditioning with exercise and more activity for now, as he was sedentary before.  If not  improving or persistent lower extremity and possible neurological episodes Will refer to [facility] for consultation. Ultimately he may warrant MRI of Lumbar spine if the thought is for some nerve impingement.  Advised strict precautions when to go to hospital ED if worsening headache, stroke symptoms, syncope, chest pain dyspnea.   No orders of the defined types were placed in this encounter.    Follow up plan: Return in about 3 months (around 08/13/2020) for Annual Physical.  Future labs ordered for 08/01/20 NO CMET A1c - but do LIPID CBC PSA TSH  Nobie Putnam, DO [facility] 05/13/2020, 1:34 PM

## 2020-05-22 ENCOUNTER — Ambulatory Visit: Payer: Medicare HMO | Admitting: Podiatry

## 2020-05-28 ENCOUNTER — Ambulatory Visit: Payer: Medicare HMO | Admitting: Dermatology

## 2020-05-28 ENCOUNTER — Other Ambulatory Visit: Payer: Self-pay

## 2020-05-28 DIAGNOSIS — L821 Other seborrheic keratosis: Secondary | ICD-10-CM

## 2020-05-28 DIAGNOSIS — L820 Inflamed seborrheic keratosis: Secondary | ICD-10-CM | POA: Diagnosis not present

## 2020-05-28 DIAGNOSIS — L570 Actinic keratosis: Secondary | ICD-10-CM | POA: Diagnosis not present

## 2020-05-28 DIAGNOSIS — L578 Other skin changes due to chronic exposure to nonionizing radiation: Secondary | ICD-10-CM

## 2020-05-28 NOTE — Patient Instructions (Signed)
Cryotherapy Aftercare  . Wash gently with soap and water everyday.   . Apply Vaseline and Band-Aid daily until healed.  

## 2020-05-28 NOTE — Progress Notes (Signed)
   Follow-Up Visit   Subjective  Stephen White is a 84 y.o. male who presents for the following: Actinic Keratosis (3 months f/u, pt c/o scaly pink irritated areas on his face ).   The following portions of the chart were reviewed this encounter and updated as appropriate:  Tobacco  Allergies  Meds  Problems  Med Hx  Surg Hx  Fam Hx      Review of Systems:  No other skin or systemic complaints except as noted in HPI or Assessment and Plan.  Objective  Well appearing patient in no apparent distress; mood and affect are within normal limits.  A focused examination was performed including face, scalp. Relevant physical exam findings are noted in the Assessment and Plan.  Objective  Head - Anterior (Face) (26): Erythematous thin papules/macules with gritty scale.   Objective  Head - Anterior (Face) (3): Erythematous keratotic or waxy stuck-on papule or plaque.    Assessment & Plan    Seborrheic Keratoses - Stuck-on, waxy, tan-brown papules and plaques  - Discussed benign etiology and prognosis. - Observe - Call for any changes  Actinic Damage - diffuse scaly erythematous macules with underlying dyspigmentation - Recommend daily broad spectrum sunscreen SPF 30+ to sun-exposed areas, reapply every 2 hours as needed.  - Call for new or changing lesions.  AK (actinic keratosis) (26) Head - Anterior (Face)  Destruction of lesion - Head - Anterior (Face) Complexity: simple   Destruction method: cryotherapy   Informed consent: discussed and consent obtained   Timeout:  patient name, date of birth, surgical site, and procedure verified Lesion destroyed using liquid nitrogen: Yes   Region frozen until ice ball extended beyond lesion: Yes   Outcome: patient tolerated procedure well with no complications   Post-procedure details: wound care instructions given    Inflamed seborrheic keratosis (3) Head - Anterior (Face)  Destruction of lesion - Head - Anterior  (Face) Complexity: simple   Destruction method: cryotherapy   Informed consent: discussed and consent obtained   Timeout:  patient name, date of birth, surgical site, and procedure verified Lesion destroyed using liquid nitrogen: Yes   Region frozen until ice ball extended beyond lesion: Yes   Outcome: patient tolerated procedure well with no complications   Post-procedure details: wound care instructions given    Return in about 2 months (around 07/28/2020). IMarye Round, CMA, am acting as scribe for Sarina Ser, MD .  Documentation: I have reviewed the above documentation for accuracy and completeness, and I agree with the above.  Sarina Ser, MD

## 2020-06-03 ENCOUNTER — Encounter: Payer: Self-pay | Admitting: Dermatology

## 2020-08-01 ENCOUNTER — Other Ambulatory Visit: Payer: Self-pay

## 2020-08-01 ENCOUNTER — Other Ambulatory Visit: Payer: Medicare HMO

## 2020-08-01 DIAGNOSIS — R351 Nocturia: Secondary | ICD-10-CM

## 2020-08-01 DIAGNOSIS — E1169 Type 2 diabetes mellitus with other specified complication: Secondary | ICD-10-CM

## 2020-08-01 DIAGNOSIS — Z0000 Encounter for general adult medical examination without abnormal findings: Secondary | ICD-10-CM

## 2020-08-01 DIAGNOSIS — N184 Chronic kidney disease, stage 4 (severe): Secondary | ICD-10-CM

## 2020-08-01 DIAGNOSIS — E785 Hyperlipidemia, unspecified: Secondary | ICD-10-CM

## 2020-08-01 DIAGNOSIS — E1165 Type 2 diabetes mellitus with hyperglycemia: Secondary | ICD-10-CM

## 2020-08-01 DIAGNOSIS — I129 Hypertensive chronic kidney disease with stage 1 through stage 4 chronic kidney disease, or unspecified chronic kidney disease: Secondary | ICD-10-CM

## 2020-08-01 LAB — CBC WITH DIFFERENTIAL/PLATELET
Absolute Monocytes: 886 cells/uL (ref 200–950)
Basophils Absolute: 139 cells/uL (ref 0–200)
Basophils Relative: 1.7 %
Eosinophils Absolute: 1000 cells/uL — ABNORMAL HIGH (ref 15–500)
Eosinophils Relative: 12.2 %
HCT: 42.2 % (ref 38.5–50.0)
Hemoglobin: 13.8 g/dL (ref 13.2–17.1)
Lymphs Abs: 2608 cells/uL (ref 850–3900)
MCH: 31.2 pg (ref 27.0–33.0)
MCHC: 32.7 g/dL (ref 32.0–36.0)
MCV: 95.3 fL (ref 80.0–100.0)
MPV: 11.3 fL (ref 7.5–12.5)
Monocytes Relative: 10.8 %
Neutro Abs: 3567 cells/uL (ref 1500–7800)
Neutrophils Relative %: 43.5 %
Platelets: 194 10*3/uL (ref 140–400)
RBC: 4.43 10*6/uL (ref 4.20–5.80)
RDW: 12 % (ref 11.0–15.0)
Total Lymphocyte: 31.8 %
WBC: 8.2 10*3/uL (ref 3.8–10.8)

## 2020-08-01 LAB — LIPID PANEL
Cholesterol: 152 mg/dL (ref <200)
HDL: 56 mg/dL (ref >=40)
LDL Cholesterol (Calc): 82 mg/dL (calc)
Non-HDL Cholesterol (Calc): 96 mg/dL (calc) (ref <130)
Total CHOL/HDL Ratio: 2.7 (calc) (ref <5.0)
Triglycerides: 68 mg/dL (ref <150)

## 2020-08-01 LAB — TSH: TSH: 2.37 mIU/L (ref 0.40–4.50)

## 2020-08-01 LAB — PSA: PSA: 1.6 ng/mL (ref <=4.0)

## 2020-08-04 ENCOUNTER — Other Ambulatory Visit: Payer: Self-pay | Admitting: Family Medicine

## 2020-08-05 ENCOUNTER — Ambulatory Visit: Payer: Medicare HMO | Admitting: Dermatology

## 2020-08-05 ENCOUNTER — Ambulatory Visit: Payer: Medicare HMO

## 2020-08-05 ENCOUNTER — Telehealth: Payer: Self-pay

## 2020-08-05 ENCOUNTER — Other Ambulatory Visit: Payer: Self-pay

## 2020-08-05 DIAGNOSIS — C44329 Squamous cell carcinoma of skin of other parts of face: Secondary | ICD-10-CM

## 2020-08-05 DIAGNOSIS — C44310 Basal cell carcinoma of skin of unspecified parts of face: Secondary | ICD-10-CM

## 2020-08-05 DIAGNOSIS — C44320 Squamous cell carcinoma of skin of unspecified parts of face: Secondary | ICD-10-CM

## 2020-08-05 DIAGNOSIS — C44319 Basal cell carcinoma of skin of other parts of face: Secondary | ICD-10-CM

## 2020-08-05 DIAGNOSIS — L820 Inflamed seborrheic keratosis: Secondary | ICD-10-CM | POA: Diagnosis not present

## 2020-08-05 DIAGNOSIS — L578 Other skin changes due to chronic exposure to nonionizing radiation: Secondary | ICD-10-CM

## 2020-08-05 DIAGNOSIS — D492 Neoplasm of unspecified behavior of bone, soft tissue, and skin: Secondary | ICD-10-CM

## 2020-08-05 DIAGNOSIS — L570 Actinic keratosis: Secondary | ICD-10-CM

## 2020-08-05 NOTE — Patient Instructions (Addendum)

## 2020-08-05 NOTE — Progress Notes (Signed)
Follow-Up Visit   Subjective  Stephen White is a 84 y.o. male who presents for the following: Actinic Keratosis (face, 24m f/u) and ISK f/u (face 37m f/u).  The following portions of the chart were reviewed this encounter and updated as appropriate:  Tobacco  Allergies  Meds  Problems  Med Hx  Surg Hx  Fam Hx     Review of Systems:  No other skin or systemic complaints except as noted in HPI or Assessment and Plan.  Objective  Well appearing patient in no apparent distress; mood and affect are within normal limits.  A focused examination was performed including face, ears. Relevant physical exam findings are noted in the Assessment and Plan.  Objective  face/ears x 18 (18): Pink scaly macules   Objective  Left lat forehead above the brow: 1.1cm hyperkeratotic pap  Objective  R of midline forehead: 0.6cm hyperkeratotic pap  Objective  face x 2 (2): Erythematous keratotic or waxy stuck-on papule or plaque.    Assessment & Plan    Actinic Damage - diffuse scaly erythematous macules with underlying dyspigmentation - Recommend daily broad spectrum sunscreen SPF 30+ to sun-exposed areas, reapply every 2 hours as needed.  - Call for new or changing lesions.   AK (actinic keratosis) (18) face/ears x 18  Destruction of lesion - face/ears x 18 Complexity: simple   Destruction method: cryotherapy   Informed consent: discussed and consent obtained   Timeout:  patient name, date of birth, surgical site, and procedure verified Lesion destroyed using liquid nitrogen: Yes   Region frozen until ice ball extended beyond lesion: Yes   Outcome: patient tolerated procedure well with no complications   Post-procedure details: wound care instructions given    Neoplasm of skin (2) Left lat forehead above the brow  Epidermal / dermal shaving  Lesion diameter (cm):  1.1 Informed consent: discussed and consent obtained   Timeout: patient name, date of birth, surgical site,  and procedure verified   Procedure prep:  Patient was prepped and draped in usual sterile fashion Prep type:  Isopropyl alcohol Anesthesia: the lesion was anesthetized in a standard fashion   Anesthetic:  1% lidocaine w/ epinephrine 1-100,000 buffered w/ 8.4% NaHCO3 Instrument used: flexible razor blade   Hemostasis achieved with: pressure, aluminum chloride and electrodesiccation   Outcome: patient tolerated procedure well   Post-procedure details: sterile dressing applied and wound care instructions given   Dressing type: bandage and petrolatum    Destruction of lesion Complexity: extensive   Destruction method: electrodesiccation and curettage   Informed consent: discussed and consent obtained   Timeout:  patient name, date of birth, surgical site, and procedure verified Procedure prep:  Patient was prepped and draped in usual sterile fashion Prep type:  Isopropyl alcohol Anesthesia: the lesion was anesthetized in a standard fashion   Anesthetic:  1% lidocaine w/ epinephrine 1-100,000 buffered w/ 8.4% NaHCO3 Curettage performed in three different directions: Yes   Electrodesiccation performed over the curetted area: Yes   Lesion length (cm):  1.1 Lesion width (cm):  1.1 Margin per side (cm):  0.2 Final wound size (cm):  1.5 Hemostasis achieved with:  pressure, aluminum chloride and electrodesiccation Outcome: patient tolerated procedure well with no complications   Post-procedure details: sterile dressing applied and wound care instructions given   Dressing type: bandage and petrolatum    Specimen 1 - Surgical pathology Differential Diagnosis: D48.5 R/O SCC Check Margins: No 1.1cm hyperkeratotic pap EDC today  R of midline forehead  Epidermal / dermal shaving  Lesion diameter (cm):  0.6 Informed consent: discussed and consent obtained   Timeout: patient name, date of birth, surgical site, and procedure verified   Procedure prep:  Patient was prepped and draped in usual  sterile fashion Prep type:  Isopropyl alcohol Anesthesia: the lesion was anesthetized in a standard fashion   Anesthetic:  1% lidocaine w/ epinephrine 1-100,000 buffered w/ 8.4% NaHCO3 Instrument used: flexible razor blade   Hemostasis achieved with: pressure, aluminum chloride and electrodesiccation   Outcome: patient tolerated procedure well   Post-procedure details: sterile dressing applied and wound care instructions given   Dressing type: bandage and petrolatum    Destruction of lesion Complexity: extensive   Destruction method: electrodesiccation and curettage   Informed consent: discussed and consent obtained   Timeout:  patient name, date of birth, surgical site, and procedure verified Procedure prep:  Patient was prepped and draped in usual sterile fashion Prep type:  Isopropyl alcohol Anesthesia: the lesion was anesthetized in a standard fashion   Anesthetic:  1% lidocaine w/ epinephrine 1-100,000 buffered w/ 8.4% NaHCO3 Curettage performed in three different directions: Yes   Electrodesiccation performed over the curetted area: Yes   Lesion length (cm):  0.6 Lesion width (cm):  0.6 Margin per side (cm):  0.2 Final wound size (cm):  1 Hemostasis achieved with:  pressure, aluminum chloride and electrodesiccation Outcome: patient tolerated procedure well with no complications   Post-procedure details: sterile dressing applied and wound care instructions given   Dressing type: bandage and petrolatum    Specimen 2 - Surgical pathology Differential Diagnosis: D48.5 R/O SCC Check Margins: No 0.6cm hyperkeratotic pap EDC today  R/O SCC L lat forehead above the brow, bx and EDC today  R/O SCC R of midline forehead, bx and EDC today  Inflamed seborrheic keratosis (2) face x 2  Destruction of lesion - face x 2 Complexity: simple   Destruction method: cryotherapy   Informed consent: discussed and consent obtained   Timeout:  patient name, date of birth, surgical site,  and procedure verified Lesion destroyed using liquid nitrogen: Yes   Region frozen until ice ball extended beyond lesion: Yes   Outcome: patient tolerated procedure well with no complications   Post-procedure details: wound care instructions given    Return in about 3 months (around 11/05/2020) for AK f/u.   I, Othelia Pulling, RMA, am acting as scribe for Sarina Ser, MD .  Documentation: I have reviewed the above documentation for accuracy and completeness, and I agree with the above.  Sarina Ser, MD

## 2020-08-05 NOTE — Telephone Encounter (Signed)
This nurse called patient in order  To perform scheduled telephonic AWV. Patient stated that he was unable to talk right now and asked if we could reschedule for next week. We will talk 08/12/2020 at 2:40.

## 2020-08-11 ENCOUNTER — Telehealth: Payer: Self-pay

## 2020-08-11 NOTE — Telephone Encounter (Signed)
-----   Message from Ralene Bathe, MD sent at 08/07/2020  6:31 PM EDT ----- 1. Skin , left lat forehead above the brow BASAL CELL CARCINOMA, NODULAR PATTERN 2. Skin , right of midline forehead WELL DIFFERENTIATED SQUAMOUS CELL CARCINOMA  1&2 - both cancer = BCC and SCC Both already treated Recheck next visit

## 2020-08-11 NOTE — Telephone Encounter (Signed)
Patient informed of pathology results 

## 2020-08-12 ENCOUNTER — Telehealth: Payer: Self-pay

## 2020-08-12 ENCOUNTER — Encounter: Payer: Self-pay | Admitting: Dermatology

## 2020-08-12 ENCOUNTER — Ambulatory Visit (INDEPENDENT_AMBULATORY_CARE_PROVIDER_SITE_OTHER): Payer: Medicare HMO

## 2020-08-12 VITALS — Ht 67.0 in | Wt 195.0 lb

## 2020-08-12 DIAGNOSIS — Z0000 Encounter for general adult medical examination without abnormal findings: Secondary | ICD-10-CM

## 2020-08-12 DIAGNOSIS — E119 Type 2 diabetes mellitus without complications: Secondary | ICD-10-CM | POA: Diagnosis not present

## 2020-08-12 DIAGNOSIS — Z794 Long term (current) use of insulin: Secondary | ICD-10-CM | POA: Diagnosis not present

## 2020-08-12 NOTE — Progress Notes (Signed)
I connected with Stephen White today by telephone and verified that I am speaking with the correct person using two identifiers. Location patient: home Location provider: work Persons participating in the virtual visit: Stephen White, Glenna Durand LPN.   I discussed the limitations, risks, security and privacy concerns of performing an evaluation and management service by telephone and the availability of in person appointments. I also discussed with the patient that there may be a patient responsible charge related to this service. The patient expressed understanding and verbally consented to this telephonic visit.    Interactive audio and video telecommunications were attempted between this provider and patient, however failed, due to patient having technical difficulties OR patient did not have access to video capability.  We continued and completed visit with audio only.    Vital signs may be patient reported or missing.   Subjective:   Stephen White is a 84 y.o. male who presents for Medicare Annual/Subsequent preventive examination.  Review of Systems     Cardiac Risk Factors include: advanced age (>26men, >11 women);diabetes mellitus;dyslipidemia;hypertension;male gender;obesity (BMI >30kg/m2);sedentary lifestyle     Objective:    Today's Vitals   08/12/20 1456  Weight: 195 lb (88.5 kg)  Height: 5\' 7"  (1.702 m)   Body mass index is 30.54 kg/m.  Advanced Directives 08/12/2020 12/18/2019 06/08/2018 05/09/2018 06/07/2017 10/03/2015 07/02/2015  Does Patient Have a Medical Advance Directive? Yes Yes Yes Yes Yes Yes Yes  Type of Paramedic of [place];Living will [place];Living will [place];Living will [place];Living will Living will [place];Living will Living will  Does patient want to make changes to medical advance directive? - - - Yes (Inpatient - patient defers changing a  medical advance directive at this time) - - -  Copy of [place] in Chart? No - copy requested No - copy requested No - copy requested No - copy requested - No - copy requested -    Current Medications (verified) Outpatient Encounter Medications as of 08/12/2020  Medication Sig  . amLODipine (NORVASC) 5 MG tablet Take 5 mg by mouth at bedtime.  Marland Kitchen aspirin 81 MG tablet Take 81 mg by mouth daily.   . Biotin (BIOTIN 5000) 5 MG CAPS Take by mouth daily.  . Carboxymethylcellulose Sodium (EYE DROPS OP) Apply to eye.  . Continuous Blood Gluc Receiver (FREESTYLE LIBRE 14 DAY READER) DEVI by Does not apply route.  . Emollient (GOLD BOND ULTIMATE) LOTN Apply topically at bedtime. Diabetic lotion  . famotidine (PEPCID) 20 MG tablet TAKE 1 TABLET(20 MG) BY MOUTH TWICE DAILY  . gabapentin (NEURONTIN) 300 MG capsule Take 300 mg by mouth at bedtime as needed.  . hydrochlorothiazide (HYDRODIURIL) 25 MG tablet Take 12.5 mg by mouth daily. In AM (half dose)  . insulin aspart (NOVOLOG) 100 UNIT/ML FlexPen Inject 25 units BEFORE breakfast and supper. Inject 15 units BEFORE lunch.  . Insulin Detemir (LEVEMIR) 100 UNIT/ML Pen Inject 30-35 Units into the skin. 35 units in AM and 30 units in PM - depending on BS reading  . loratadine (ALLERGY RELIEF) 10 MG tablet Take 10 mg by mouth daily as needed for allergies.  Marland Kitchen losartan (COZAAR) 100 MG tablet Take 1 tablet (100 mg total) by mouth daily.  . MULTIPLE VITAMIN PO Take by mouth daily.   . ONE TOUCH ULTRA TEST test strip 1 each by Other route as needed.   . pravastatin (PRAVACHOL) 10 MG tablet  TAKE 1 TABLET BY MOUTH EVERY DAY  . ibuprofen (ADVIL,MOTRIN) 600 MG tablet TAKE 1 TABLET(600 MG) BY MOUTH THREE TIMES DAILY AS NEEDED (Patient not taking: Reported on 08/12/2020)  . Insulin Lispro (HUMALOG KWIKPEN [place]) Inject into the skin as directed. As needed depending on BS readings twice daily Prescribed as 25 in AM, 15 at dinner and 25 at supper. (Patient  not taking: Reported on 08/12/2020)  . omeprazole (PRILOSEC) 20 MG capsule Take 20 mg by mouth daily. (Patient not taking: Reported on 08/12/2020)   No facility-administered encounter medications on file as of 08/12/2020.    Allergies (verified) Prednisone   History: Past Medical History:  Diagnosis Date  . Actinic keratosis   . Allergy   . Basal cell carcinoma 08/29/2018   L post inf ear/excision  . Chronic kidney disease   . GERD (gastroesophageal reflux disease)   . Hyperlipidemia   . Hypertension   . Squamous cell carcinoma of skin 03/26/2015   L dorsal hand  . Squamous cell carcinoma of skin 06/17/2015   R cheek  . Squamous cell carcinoma of skin 05/10/2019   R hand dorsum/in situ  . Thyroid disease    Past Surgical History:  Procedure Laterality Date  . EYE SURGERY     Family History  Problem Relation Age of Onset  . Diabetes Mother   . Diabetes Father   . Stroke Father   . Diabetes Brother   . Diabetes Sister   . Diabetes Sister   . Diabetes Brother   . Diabetes Brother   . Diabetes Brother   . Cancer Brother   . Diabetes Brother    Social History   Socioeconomic History  . Marital status: Married    Spouse name: Not on file  . Number of children: 1  . Years of education: Not on file  . Highest education level: 12th grade  Occupational History  . Occupation: retired  Tobacco Use  . Smoking status: Former Smoker    Packs/day: 0.30    Years: 25.00    Pack years: 7.50    Types: Cigarettes    Quit date: 12/19/1978    Years since quitting: 41.6  . Smokeless tobacco: Former Network engineer  . Vaping Use: Never used  Substance and Sexual Activity  . Alcohol use: No    Alcohol/week: 0.0 standard drinks  . Drug use: No  . Sexual activity: Not on file  Other Topics Concern  . Not on file  Social History Narrative  . Not on file   Social Determinants of Health   Financial Resource Strain: Low Risk   . Difficulty of Paying Living Expenses: Not  hard at all  Food Insecurity: No Food Insecurity  . Worried About Charity fundraiser in the Last Year: Never true  . Ran Out of Food in the Last Year: Never true  Transportation Needs: No Transportation Needs  . Lack of Transportation (Medical): No  . Lack of Transportation (Non-Medical): No  Physical Activity: Inactive  . Days of Exercise per Week: 0 days  . Minutes of Exercise per Session: 0 min  Stress: No Stress Concern Present  . Feeling of Stress : Not at all  Social Connections: Moderately Isolated  . Frequency of Communication with Friends and Family: Never  . Frequency of Social Gatherings with Friends and Family: Never  . Attends Religious Services: More than 4 times per year  . Active Member of Clubs or Organizations: No  . Attends  Club or Organization Meetings: Never  . Marital Status: Married    Tobacco Counseling Counseling given: Not Answered   Clinical Intake:  Pre-visit preparation completed: Yes  Pain : No/denies pain     Nutritional Status: BMI > 30  Obese Nutritional Risks: None Diabetes: Yes  How often do you need to have someone help you when you read instructions, pamphlets, or other written materials from your doctor or pharmacy?: 1 - Never What is the last grade level you completed in school?: 12th grade  Diabetic? Yes Nutrition Risk Assessment:  Has the patient had any N/V/D within the last 2 months?  No  Does the patient have any non-healing wounds?  No  Has the patient had any unintentional weight loss or weight gain?  Yes   Diabetes:  Is the patient diabetic?  Yes  If diabetic, was a CBG obtained today?  No  Did the patient bring in their glucometer from home?  No  How often do you monitor your CBG's? Constantly (has freestyle [place]).   Financial Strains and Diabetes Management:  Are you having any financial strains with the device, your supplies or your medication? No .  Does the patient want to be seen by Chronic Care Management  for management of their diabetes?  No  Would the patient like to be referred to a Nutritionist or for Diabetic Management?  No   Diabetic Exams:  Diabetic Eye Exam: Overdue for diabetic eye exam. Pt has been advised about the importance in completing this exam. Patient advised to call and schedule an eye exam. Diabetic Foot Exam: Completed 08/23/2019   Interpreter Needed?: No  Information entered by :: NAllen LPN   Activities of Daily Living In your present state of health, do you have any difficulty performing the following activities: 08/12/2020 04/01/2020  Hearing? Y N  Comment hearing aides -  Vision? Y N  Comment right eye blurriness, trouble focusing at times -  Difficulty concentrating or making decisions? N N  Walking or climbing stairs? N N  Comment - -  Dressing or bathing? N N  Doing errands, shopping? Y N  Comment has wife with him -  Conservation officer, nature and eating ? N -  Using the Toilet? N -  In the past six months, have you accidently leaked urine? Y -  Comment sometimes -  Do you have problems with loss of bowel control? N -  Managing your Medications? N -  Managing your Finances? N -  Comment - -  Housekeeping or managing your Housekeeping? N -  Some recent data might be hidden    Patient Care Team: Olin Hauser, DO as PCP - General (Family Medicine) Eulogio Bear, MD as Consulting Physician (Ophthalmology) Gabriel Carina Betsey Holiday, MD as Physician Assistant (Endocrinology) Ralene Bathe, MD as Consulting Physician (Dermatology)  Indicate any recent Medical Services you may have received from other than [facility] providers in the past year (date may be approximate).     Assessment:   This is a routine wellness examination for [employer].  Hearing/Vision screen  Hearing Screening   125Hz  250Hz  500Hz  1000Hz  2000Hz  3000Hz  4000Hz  6000Hz  8000Hz   Right ear:           Left ear:           Vision Screening Comments: Regular eye exams, Dr. Edison Pace  Dietary issues  and exercise activities discussed: Current Exercise Habits: The patient does not participate in regular exercise at present  Goals    .  DIET - INCREASE WATER INTAKE     Recommend increasing water intake to 6-8 glasses a day.     . Patient Stated     08/12/2020, no goals      Depression Screen PHQ 2/9 Scores 08/12/2020 04/01/2020 02/19/2020 12/18/2019 04/26/2019 06/08/2018 06/07/2017  PHQ - 2 Score 0 0 0 0 0 0 0    Fall Risk Fall Risk  08/12/2020 04/01/2020 02/19/2020 12/18/2019 04/26/2019  Falls in the past year? 0 0 0 0 0  Number falls in past yr: - 0 0 0 -  Injury with Fall? - 0 0 0 -  Risk for fall due to : Medication side effect - - - -  Follow up Falls evaluation completed;Education provided;Falls prevention discussed - Falls evaluation completed - -    Any stairs in or around the home? No  If so, are there any without handrails? n/a Home free of loose throw rugs in walkways, pet beds, electrical cords, etc? Yes  Adequate lighting in your home to reduce risk of falls? Yes   ASSISTIVE DEVICES UTILIZED TO PREVENT FALLS:  Life alert? No  Use of a cane, walker or w/c? No  Grab bars in the bathroom? Yes  Shower chair or bench in shower? No  Elevated toilet seat or a handicapped toilet? No   TIMED UP AND GO:  Was the test performed? No .    Cognitive Function:     6CIT Screen 08/12/2020 06/08/2018 06/07/2017  What Year? 0 points 4 points 0 points  What month? 0 points 0 points 0 points  What time? 0 points 0 points 0 points  Count back from 20 2 points 0 points 0 points  Months in reverse 4 points 2 points 4 points  Repeat phrase 0 points 0 points 0 points  Total Score 6 6 4     Immunizations Immunization History  Administered Date(s) Administered  . Fluad Quad(high Dose 65+) 11/02/2019  . Influenza, High Dose Seasonal PF 10/03/2015, 11/03/2016, 10/06/2017, 11/06/2018  . PFIZER SARS-COV-2 Vaccination 01/14/2020, 02/04/2020  . Pneumococcal Conjugate-13 12/24/2014  .  Pneumococcal Polysaccharide-23 12/25/2012  . Td 07/02/2015  . Zoster 12/25/2012    TDAP status: Up to date Flu Vaccine status: Up to date Pneumococcal vaccine status: Up to date Covid-19 vaccine status: Completed vaccines  Qualifies for Shingles Vaccine? Yes   Zostavax completed Yes   Shingrix Completed?: No.    Education has been provided regarding the importance of this vaccine. Patient has been advised to call insurance company to determine out of pocket expense if they have not yet received this vaccine. Advised may also receive vaccine at local pharmacy or Health Dept. Verbalized acceptance and understanding.  Screening Tests Health Maintenance  Topic Date Due  . INFLUENZA VACCINE  07/20/2020  . OPHTHALMOLOGY EXAM  07/25/2020  . FOOT EXAM  08/22/2020  . HEMOGLOBIN A1C  09/06/2020  . TETANUS/TDAP  07/01/2025  . COVID-19 Vaccine  Completed  . PNA vac Low Risk Adult  Completed    Health Maintenance  Health Maintenance Due  Topic Date Due  . INFLUENZA VACCINE  07/20/2020  . OPHTHALMOLOGY EXAM  07/25/2020    Colorectal cancer screening: No longer required.   Lung Cancer Screening: (Low Dose CT Chest recommended if Age 56-80 years, 30 pack-year currently smoking OR have quit w/in 15years.) does not qualify.   Lung Cancer Screening Referral: no  Additional Screening:  Hepatitis C Screening: does not qualify;  Vision Screening: Recommended annual ophthalmology exams for early detection  of glaucoma and other disorders of the eye. Is the patient up to date with their annual eye exam?  No  Who is the provider or what is the name of the office in which the patient attends annual eye exams? Dr. Edison Pace If pt is not established with a provider, would they like to be referred to a provider to establish care? No .   Dental Screening: Recommended annual dental exams for proper oral hygiene  Community Resource Referral / Chronic Care Management: CRR required this visit?  No    CCM required this visit?  Yes      Plan:     I have personally reviewed and noted the following in the patient's chart:   . Medical and social history . Use of alcohol, tobacco or illicit drugs  . Current medications and supplements . Functional ability and status . Nutritional status . Physical activity . Advanced directives . List of other physicians . Hospitalizations, surgeries, and ER visits in previous 12 months . Vitals . Screenings to include cognitive, depression, and falls . Referrals and appointments  In addition, I have reviewed and discussed with patient certain preventive protocols, quality metrics, and best practice recommendations. A written personalized care plan for preventive services as well as general preventive health recommendations were provided to patient.     Kellie Simmering, LPN   8/87/1957   Nurse Notes:

## 2020-08-12 NOTE — Chronic Care Management (AMB) (Signed)
  Chronic Care Management   Outreach Note  08/12/2020 Name: Stephen White MRN: [id number] DOB: 10-05-1936  Stephen White is a 84 y.o. year old male who is a primary care patient of Stephen Hauser, DO. I reached out to [name] by phone today in response to a referral sent by Stephen White PCP, Dr. Parks White     An unsuccessful telephone outreach was attempted today. The patient was referred to the case management team for assistance with care management and care coordination.   Follow Up Plan: A HIPPA compliant phone message was left for the patient providing contact information and requesting a return call.  The care management team will reach out to the patient again over the next 7 days.  If patient returns call to provider office, please advise to call [facility] [name], [name], [place], [name] [address] Direct Dial: [phone] [email or web address] Website: [email or web address]

## 2020-08-12 NOTE — Patient Instructions (Signed)
Stephen White , Thank you for taking time to come for your Medicare Wellness Visit. I appreciate your ongoing commitment to your health goals. Please review the following plan we discussed and let me know if I can assist you in the future.   Screening recommendations/referrals: Colonoscopy: not required Recommended yearly ophthalmology/optometry visit for glaucoma screening and checkup Recommended yearly dental visit for hygiene and checkup  Vaccinations: Influenza vaccine: due Pneumococcal vaccine: completed 12/24/2014 Tdap vaccine: completed 07/02/2015 Shingles vaccine: discussed   Covid-19:  02/04/2020, 01/14/2020  Advanced directives: Please bring a copy of your POA (Power of Attorney) and/or Living Will to your next appointment.   Conditions/risks identified: none  Next appointment: Follow up in one year for your annual wellness visit.   Preventive Care 42 Years and Older, Male Preventive care refers to lifestyle choices and visits with your health care provider that can promote health and wellness. What does preventive care include?  A yearly physical exam. This is also called an annual well check.  Dental exams once or twice a year.  Routine eye exams. Ask your health care provider how often you should have your eyes checked.  Personal lifestyle choices, including:  Daily care of your teeth and gums.  Regular physical activity.  Eating a healthy diet.  Avoiding tobacco and drug use.  Limiting alcohol use.  Practicing safe sex.  Taking low doses of aspirin every day.  Taking vitamin and mineral supplements as recommended by your health care provider. What happens during an annual well check? The services and screenings done by your health care provider during your annual well check will depend on your age, overall health, lifestyle risk factors, and family history of disease. Counseling  Your health care provider may ask you questions about your:  Alcohol  use.  Tobacco use.  Drug use.  Emotional well-being.  Home and relationship well-being.  Sexual activity.  Eating habits.  History of falls.  Memory and ability to understand (cognition).  Work and work Statistician. Screening  You may have the following tests or measurements:  Height, weight, and BMI.  Blood pressure.  Lipid and cholesterol levels. These may be checked every 5 years, or more frequently if you are over 45 years old.  Skin check.  Lung cancer screening. You may have this screening every year starting at age 57 if you have a 30-pack-year history of smoking and currently smoke or have quit within the past 15 years.  Fecal occult blood test (FOBT) of the stool. You may have this test every year starting at age 55.  Flexible sigmoidoscopy or colonoscopy. You may have a sigmoidoscopy every 5 years or a colonoscopy every 10 years starting at age 23.  Prostate cancer screening. Recommendations will vary depending on your family history and other risks.  Hepatitis C blood test.  Hepatitis B blood test.  Sexually transmitted disease (STD) testing.  Diabetes screening. This is done by checking your blood sugar (glucose) after you have not eaten for a while (fasting). You may have this done every 1-3 years.  Abdominal aortic aneurysm (AAA) screening. You may need this if you are a current or former smoker.  Osteoporosis. You may be screened starting at age 40 if you are at high risk. Talk with your health care provider about your test results, treatment options, and if necessary, the need for more tests. Vaccines  Your health care provider may recommend certain vaccines, such as:  Influenza vaccine. This is recommended every year.  Tetanus,  diphtheria, and acellular pertussis (Tdap, Td) vaccine. You may need a Td booster every 10 years.  Zoster vaccine. You may need this after age 32.  Pneumococcal 13-valent conjugate (PCV13) vaccine. One dose is  recommended after age 83.  Pneumococcal polysaccharide (PPSV23) vaccine. One dose is recommended after age 62. Talk to your health care provider about which screenings and vaccines you need and how often you need them. This information is not intended to replace advice given to you by your health care provider. Make sure you discuss any questions you have with your health care provider. Document Released: 01/02/2016 Document Revised: 08/25/2016 Document Reviewed: 10/07/2015 Elsevier Interactive Patient Education  2017 [place] Prevention in the Home Falls can cause injuries. They can happen to people of all ages. There are many things you can do to make your home safe and to help prevent falls. What can I do on the outside of my home?  Regularly fix the edges of walkways and driveways and fix any cracks.  Remove anything that might make you trip as you walk through a door, such as a raised step or threshold.  Trim any bushes or trees on the path to your home.  Use bright outdoor lighting.  Clear any walking paths of anything that might make someone trip, such as rocks or tools.  Regularly check to see if handrails are loose or broken. Make sure that both sides of any steps have handrails.  Any raised decks and porches should have guardrails on the edges.  Have any leaves, snow, or ice cleared regularly.  Use sand or salt on walking paths during winter.  Clean up any spills in your garage right away. This includes oil or grease spills. What can I do in the bathroom?  Use night lights.  Install grab bars by the toilet and in the tub and shower. Do not use towel bars as grab bars.  Use non-skid mats or decals in the tub or shower.  If you need to sit down in the shower, use a plastic, non-slip stool.  Keep the floor dry. Clean up any water that spills on the floor as soon as it happens.  Remove soap buildup in the tub or shower regularly.  Attach bath mats  securely with double-sided non-slip rug tape.  Do not have throw rugs and other things on the floor that can make you trip. What can I do in the bedroom?  Use night lights.  Make sure that you have a light by your bed that is easy to reach.  Do not use any sheets or blankets that are too big for your bed. They should not hang down onto the floor.  Have a firm chair that has side arms. You can use this for support while you get dressed.  Do not have throw rugs and other things on the floor that can make you trip. What can I do in the kitchen?  Clean up any spills right away.  Avoid walking on wet floors.  Keep items that you use a lot in easy-to-reach places.  If you need to reach something above you, use a strong step stool that has a grab bar.  Keep electrical cords out of the way.  Do not use floor polish or wax that makes floors slippery. If you must use wax, use non-skid floor wax.  Do not have throw rugs and other things on the floor that can make you trip. What can I do with  my stairs?  Do not leave any items on the stairs.  Make sure that there are handrails on both sides of the stairs and use them. Fix handrails that are broken or loose. Make sure that handrails are as long as the stairways.  Check any carpeting to make sure that it is firmly attached to the stairs. Fix any carpet that is loose or worn.  Avoid having throw rugs at the top or bottom of the stairs. If you do have throw rugs, attach them to the floor with carpet tape.  Make sure that you have a light switch at the top of the stairs and the bottom of the stairs. If you do not have them, ask someone to add them for you. What else can I do to help prevent falls?  Wear shoes that:  Do not have high heels.  Have rubber bottoms.  Are comfortable and fit you well.  Are closed at the toe. Do not wear sandals.  If you use a stepladder:  Make sure that it is fully opened. Do not climb a closed  stepladder.  Make sure that both sides of the stepladder are locked into place.  Ask someone to hold it for you, if possible.  Clearly mark and make sure that you can see:  Any grab bars or handrails.  First and last steps.  Where the edge of each step is.  Use tools that help you move around (mobility aids) if they are needed. These include:  Canes.  Walkers.  Scooters.  Crutches.  Turn on the lights when you go into a dark area. Replace any light bulbs as soon as they burn out.  Set up your furniture so you have a clear path. Avoid moving your furniture around.  If any of your floors are uneven, fix them.  If there are any pets around you, be aware of where they are.  Review your medicines with your doctor. Some medicines can make you feel dizzy. This can increase your chance of falling. Ask your doctor what other things that you can do to help prevent falls. This information is not intended to replace advice given to you by your health care provider. Make sure you discuss any questions you have with your health care provider. Document Released: 10/02/2009 Document Revised: 05/13/2016 Document Reviewed: 01/10/2015 Elsevier Interactive Patient Education  2017 [employer].

## 2020-08-13 NOTE — Chronic Care Management (AMB) (Signed)
Chronic Care Management   Note  08/13/2020 Name: Stephen White MRN: [id number] DOB: Mar 27, 1936  Stephen White is a 84 y.o. year old male who is a primary care patient of Olin Hauser, DO. I reached out to [name] by phone today in response to a referral sent by Stephen White PCP, Dr. Parks Ranger     [name] was given information about Chronic Care Management services today including:  1. CCM service includes personalized support from designated clinical staff supervised by his physician, including individualized plan of care and coordination with other care providers 2. 24/7 contact phone numbers for assistance for urgent and routine care needs. 3. Service will only be billed when office clinical staff spend 20 minutes or more in a month to coordinate care. 4. Only one practitioner may furnish and bill the service in a calendar month. 5. The patient may stop CCM services at any time (effective at the end of the month) by phone call to the office staff. 6. The patient will be responsible for cost sharing (co-pay) of up to 20% of the service fee (after annual deductible is met).  Patient agreed to services and verbal consent obtained.   Follow up plan: Telephone appointment with care management team member scheduled for:09/03/2020  Noreene Larsson, [address], [place], [place] [address] Direct Dial: [phone] [email or web address]: [email or web address]

## 2020-08-15 ENCOUNTER — Other Ambulatory Visit: Payer: Self-pay

## 2020-08-15 ENCOUNTER — Encounter: Payer: Self-pay | Admitting: Family Medicine

## 2020-08-15 ENCOUNTER — Ambulatory Visit (INDEPENDENT_AMBULATORY_CARE_PROVIDER_SITE_OTHER): Payer: Medicare HMO | Admitting: Family Medicine

## 2020-08-15 VITALS — BP 134/55 | HR 60 | Temp 97.8°F | Resp 16 | Ht 67.0 in | Wt 195.6 lb

## 2020-08-15 DIAGNOSIS — E785 Hyperlipidemia, unspecified: Secondary | ICD-10-CM

## 2020-08-15 DIAGNOSIS — E119 Type 2 diabetes mellitus without complications: Secondary | ICD-10-CM | POA: Diagnosis not present

## 2020-08-15 DIAGNOSIS — Z0000 Encounter for general adult medical examination without abnormal findings: Secondary | ICD-10-CM

## 2020-08-15 DIAGNOSIS — I129 Hypertensive chronic kidney disease with stage 1 through stage 4 chronic kidney disease, or unspecified chronic kidney disease: Secondary | ICD-10-CM | POA: Diagnosis not present

## 2020-08-15 DIAGNOSIS — N184 Chronic kidney disease, stage 4 (severe): Secondary | ICD-10-CM

## 2020-08-15 DIAGNOSIS — Z794 Long term (current) use of insulin: Secondary | ICD-10-CM

## 2020-08-15 DIAGNOSIS — R2689 Other abnormalities of gait and mobility: Secondary | ICD-10-CM

## 2020-08-15 DIAGNOSIS — E1169 Type 2 diabetes mellitus with other specified complication: Secondary | ICD-10-CM

## 2020-08-15 DIAGNOSIS — E669 Obesity, unspecified: Secondary | ICD-10-CM

## 2020-08-15 DIAGNOSIS — E1121 Type 2 diabetes mellitus with diabetic nephropathy: Secondary | ICD-10-CM

## 2020-08-15 NOTE — Progress Notes (Signed)
Subjective:    Patient ID: Stephen White, male    DOB: 24-Nov-1936, 84 y.o.   MRN: [id number]  Stephen White is a 84 y.o. male presenting on 08/15/2020 for Annual Exam   HPI   Here for Annual Physical and Lab Review.  Lifestyle / Obesity BMI >30 HYPERLIPIDEMIA: - Reports no concerns. Last lipid panel 07/2020, controlled  - Currently taking Pravastatin 20mg , tolerating well without side effects or myalgias - Diet improved balanced - Regular activity  Balance problem / Dizziness Last visit seen me for this 04/2020 He has already been reduced on BP medications Has seen Dr Rockey Situ, thought unlikely cardiac etiology Does not have active dizziness or vertigo, more balance problem Has not agreed to Neurology referral yet  Additional PMH - Type 2 DM uncontrolled, followed by [facility] End[facility]  HTN / CKD-IV Controlled BP recently  with some slight progression has been relatively stable >4+ years. Based on results Creatinine GFR < 30. On Losartan 100mg  daily, HCTZ 12.5mg  daily, Amlodipine 5mg  daily  - Last lab done in March 2021, he will return to Endocrinology soon. - LastA1c 9.2 (03/06/20)    Health Maintenance: PSA 1.6, negative.  Depression screen [facility] 2/9 08/15/2020 08/12/2020 04/01/2020  Decreased Interest 0 0 0  Down, Depressed, Hopeless 0 0 0  PHQ - 2 Score 0 0 0    Past Medical History:  Diagnosis Date  . Actinic keratosis   . Allergy   . Basal cell carcinoma 08/29/2018   L post inf ear/excision  . Chronic kidney disease   . GERD (gastroesophageal reflux disease)   . Hyperlipidemia   . Hypertension   . Squamous cell carcinoma of skin 03/26/2015   L dorsal hand  . Squamous cell carcinoma of skin 06/17/2015   R cheek  . Squamous cell carcinoma of skin 05/10/2019   R hand dorsum/in situ  . Thyroid disease    Past Surgical History:  Procedure Laterality Date  . EYE SURGERY     Social History   Socioeconomic History  . Marital status: Married     Spouse name: Not on file  . Number of children: 1  . Years of education: Not on file  . Highest education level: 12th grade  Occupational History  . Occupation: retired  Tobacco Use  . Smoking status: Former Smoker    Packs/day: 0.30    Years: 25.00    Pack years: 7.50    Types: Cigarettes    Quit date: 12/19/1978    Years since quitting: 41.6  . Smokeless tobacco: Former Network engineer  . Vaping Use: Never used  Substance and Sexual Activity  . Alcohol use: No    Alcohol/week: 0.0 standard drinks  . Drug use: No  . Sexual activity: Not on file  Other Topics Concern  . Not on file  Social History Narrative  . Not on file   Social Determinants of Health   Financial Resource Strain: Low Risk   . Difficulty of Paying Living Expenses: Not hard at all  Food Insecurity: No Food Insecurity  . Worried About Charity fundraiser in the Last Year: Never true  . Ran Out of Food in the Last Year: Never true  Transportation Needs: No Transportation Needs  . Lack of Transportation (Medical): No  . Lack of Transportation (Non-Medical): No  Physical Activity: Inactive  . Days of Exercise per Week: 0 days  . Minutes of Exercise per Session: 0 min  Stress: No Stress  Concern Present  . Feeling of Stress : Not at all  Social Connections: Moderately Isolated  . Frequency of Communication with Friends and Family: Never  . Frequency of Social Gatherings with Friends and Family: Never  . Attends Religious Services: More than 4 times per year  . Active Member of Clubs or Organizations: No  . Attends Archivist Meetings: Never  . Marital Status: Married  Human resources officer Violence: Not At Risk  . Fear of Current or Ex-Partner: No  . Emotionally Abused: No  . Physically Abused: No  . Sexually Abused: No   Family History  Problem Relation Age of Onset  . Diabetes Mother   . Diabetes Father   . Stroke Father   . Diabetes Brother   . Diabetes Sister   . Diabetes Sister   .  Diabetes Brother   . Diabetes Brother   . Diabetes Brother   . Cancer Brother   . Diabetes Brother    Current Outpatient Medications on File Prior to Visit  Medication Sig  . amLODipine (NORVASC) 5 MG tablet Take 5 mg by mouth at bedtime.  Marland Kitchen aspirin 81 MG tablet Take 81 mg by mouth daily.   . Biotin (BIOTIN 5000) 5 MG CAPS Take by mouth daily.  . Carboxymethylcellulose Sodium (EYE DROPS OP) Apply to eye.  . Continuous Blood Gluc Receiver (FREESTYLE LIBRE 14 DAY READER) DEVI by Does not apply route.  . Emollient (GOLD BOND ULTIMATE) LOTN Apply topically at bedtime. Diabetic lotion  . famotidine (PEPCID) 20 MG tablet TAKE 1 TABLET(20 MG) BY MOUTH TWICE DAILY  . gabapentin (NEURONTIN) 300 MG capsule Take 300 mg by mouth at bedtime as needed.  . hydrochlorothiazide (HYDRODIURIL) 25 MG tablet Take 12.5 mg by mouth daily. In AM (half dose)  . ibuprofen (ADVIL,MOTRIN) 600 MG tablet TAKE 1 TABLET(600 MG) BY MOUTH THREE TIMES DAILY AS NEEDED  . insulin aspart (NOVOLOG) 100 UNIT/ML FlexPen Inject 25 units BEFORE breakfast and supper. Inject 15 units BEFORE lunch.  . Insulin Detemir (LEVEMIR) 100 UNIT/ML Pen Inject 30-35 Units into the skin. 35 units in AM and 30 units in PM - depending on BS reading  . Insulin Lispro (HUMALOG KWIKPEN [place]) Inject into the skin as directed. As needed depending on BS readings twice daily Prescribed as 25 in AM, 15 at dinner and 25 at supper.   . loratadine (ALLERGY RELIEF) 10 MG tablet Take 10 mg by mouth daily as needed for allergies.  Marland Kitchen losartan (COZAAR) 100 MG tablet Take 1 tablet (100 mg total) by mouth daily.  . MULTIPLE VITAMIN PO Take by mouth daily.   Marland Kitchen omeprazole (PRILOSEC) 20 MG capsule Take 20 mg by mouth daily.   . ONE TOUCH ULTRA TEST test strip 1 each by Other route as needed.   . pravastatin (PRAVACHOL) 10 MG tablet TAKE 1 TABLET BY MOUTH EVERY DAY   No current facility-administered medications on file prior to visit.    Review of Systems    Constitutional: Negative for activity change, appetite change, chills, diaphoresis, fatigue and fever.  HENT: Negative for congestion and hearing loss.   Eyes: Negative for visual disturbance.  Respiratory: Negative for apnea, cough, chest tightness, shortness of breath and wheezing.   Cardiovascular: Negative for chest pain, palpitations and leg swelling.  Gastrointestinal: Negative for abdominal pain, constipation, diarrhea, nausea and vomiting.  Endocrine: Negative for cold intolerance.  Genitourinary: Negative for difficulty urinating, dysuria, frequency and hematuria.  Musculoskeletal: Negative for arthralgias and neck pain.  Skin: Negative for rash.  Allergic/Immunologic: Negative for environmental allergies.  Neurological: Positive for dizziness. Negative for weakness, light-headedness, numbness and headaches.  Hematological: Negative for adenopathy.  Psychiatric/Behavioral: Negative for behavioral problems, dysphoric mood and sleep disturbance.   Per HPI unless specifically indicated above       Objective:    BP (!) 134/55   Pulse 60   Temp 97.8 F (36.6 C) (Temporal)   Resp 16   Ht 5\' 7"  (1.702 m)   Wt 195 lb 9.6 oz (88.7 kg)   SpO2 98%   BMI 30.64 kg/m   Wt Readings from Last 3 Encounters:  08/15/20 195 lb 9.6 oz (88.7 kg)  08/12/20 195 lb (88.5 kg)  05/13/20 194 lb (88 kg)    Physical Exam Vitals and nursing note reviewed.  Constitutional:      General: He is not in acute distress.    Appearance: He is well-developed. He is not diaphoretic.     Comments: Well-appearing, comfortable, cooperative  HENT:     Head: Normocephalic and atraumatic.     Ears:     Comments: Hard of hearing Eyes:     General:        Right eye: No discharge.        Left eye: No discharge.     Conjunctiva/sclera: Conjunctivae normal.  Neck:     Thyroid: No thyromegaly.  Cardiovascular:     Rate and Rhythm: Normal rate and regular rhythm.     Heart sounds: Normal heart sounds.  No murmur heard.      Comments: No ectopy Pulmonary:     Effort: Pulmonary effort is normal. No respiratory distress.     Breath sounds: Normal breath sounds. No wheezing or rales.  Musculoskeletal:        General: Normal range of motion.     Cervical back: Normal range of motion and neck supple.     Right lower leg: No edema.     Left lower leg: No edema.     Comments: Upper / Lower Extremities: - Normal muscle tone, strength bilateral upper extremities 5/5, lower extremities 5/5 - Bilateral shoulders, knees, wrist, ankles without deformity, tenderness, effusion - Normal Gait   Lymphadenopathy:     Cervical: No cervical adenopathy.  Skin:    General: Skin is warm and dry.     Findings: No erythema or rash.  Neurological:     Mental Status: He is alert and oriented to person, place, and time.  Psychiatric:        Behavior: Behavior normal.     Comments: Well groomed, good eye contact, normal speech and thoughts      Diabetic Foot Exam - Simple   Simple Foot Form Diabetic Foot exam was performed with the following findings: Yes 08/15/2020  2:10 PM  Visual Inspection See comments: Yes Sensation Testing Intact to touch and monofilament testing bilaterally: Yes Pulse Check Posterior Tibialis and Dorsalis pulse intact bilaterally: Yes Comments Callus formation without ulceration.      Results for orders placed or performed in visit on 08/01/20  TSH  Result Value Ref Range   TSH 2.37 0.40 - 4.50 mIU/L  PSA  Result Value Ref Range   PSA 1.6 < OR = 4.0 ng/mL  Lipid panel  Result Value Ref Range   Cholesterol 152 <200 mg/dL   HDL 56 > OR = 40 mg/dL   Triglycerides 68 <150 mg/dL   LDL Cholesterol (Calc) 82 mg/dL (calc)   Total CHOL/HDL  Ratio 2.7 <5.0 (calc)   Non-HDL Cholesterol (Calc) 96 <130 mg/dL (calc)  CBC with Differential/Platelet  Result Value Ref Range   WBC 8.2 3.8 - 10.8 Thousand/uL   RBC 4.43 4.20 - 5.80 Million/uL   Hemoglobin 13.8 13.2 - 17.1 g/dL    HCT 42.2 38 - 50 %   MCV 95.3 80.0 - 100.0 fL   MCH 31.2 27.0 - 33.0 pg   MCHC 32.7 32.0 - 36.0 g/dL   RDW 12.0 11.0 - 15.0 %   Platelets 194 140 - 400 Thousand/uL   MPV 11.3 7.5 - 12.5 fL   Neutro Abs 3,567 1,500 - 7,800 cells/uL   Lymphs Abs 2,608 850 - 3,900 cells/uL   Absolute Monocytes 886 200 - 950 cells/uL   Eosinophils Absolute 1,000 (H) 15 - 500 cells/uL   Basophils Absolute 139 0 - 200 cells/uL   Neutrophils Relative % 43.5 %   Total Lymphocyte 31.8 %   Monocytes Relative 10.8 %   Eosinophils Relative 12.2 %   Basophils Relative 1.7 %      Assessment & Plan:   Problem List Items Addressed This Visit    Type 2 diabetes mellitus with diabetic nephropathy, with long-term current use of insulin ([place])    Followed by [facility] Last Elevated K2I Complications - CKD IV, DM retinopathy other including hyperlipidemia,increases risk of future cardiovascular complications   Plan:  1. Continue current therapy - insulin management per Endocrine 2. Encourage improved lifestyle - low carb, low sugar diet, reduce portion size, continue improving regular exercise 3. Check CBG , bring log to next visit for review 4. Continue ASA, ARB, Statin 5. DM Foot exam done today  Request copy of last DM eye      CKD (chronic kidney disease), stage IV (HCC)    See A&P HTN Chronic progression CKD      Benign hypertension with CKD (chronic kidney disease) stage IV (HCC)    Well-controlled HTN - Home BP readings reviewed  Complication with CKD IV   Plan:  1. Continue current BP regimen  On Losartan 100mg  daily, HCTZ 12.5mg  daily, Amlodipine 5mg  daily 2. Encourage improved lifestyle - low sodium diet, regular exercise 3. Continue monitor BP outside office, bring readings to next visit, if persistently >140/90 or new symptoms notify office sooner  Future may reduce BP meds further       Other Visit Diagnoses    Annual physical exam    -  Primary   Type 2 diabetes mellitus  treated with insulin ([place])       Hyperlipidemia associated with type 2 diabetes mellitus ([place])       Balance disorder       Obesity (BMI 30.0-34.9)          Updated Health Maintenance information - Return to Flu Shot - Request copy of DM EYe exam Reviewed recent lab results with patient Encouraged improvement to lifestyle with diet and exercise - Goal of weight loss  #Balance / Dizziness Chronic problem Discussed he may trial adjusting more medications such as Gabapentin and HCTZ as options to see if would reduce his symptoms. Otherwise, offered to refer to Neurology for balance disorder as next option, he deferred until next apt.   No orders of the defined types were placed in this encounter.     Follow up plan: Return in about 3 months (around 11/15/2020) for 3 month follow-up balance/dizziness.  Nobie Putnam, [facility] 08/15/2020,  1:50 PM

## 2020-08-15 NOTE — Patient Instructions (Addendum)
Thank you for coming to the office today.  1. PSA Prostate Cancer Screening - 1.6, negative.  2. TSH Thyroid Function Tests - Normal.  3. CBC Blood Counts - Normal, no anemia, no other significant abnormality  4. Cholesterol - Controlled on pravastatin.  Yearly blood work now from our office.  Keep up with other specialists.  No specific medication that you are taking to cause the "balance" symptoms you describe. The blood pressure does not seem to be the problem.  Only one is Gabapentin may cause dizziness or off balance.  It could be inner ear problem or neurological consider - we can do referral at next visit if not improved.   Please schedule a Follow-up Appointment to: Return in about 3 months (around 11/15/2020) for 3 month follow-up balance/dizziness.  If you have any other questions or concerns, please feel free to call the office or send a message through [place]. You may also schedule an earlier appointment if necessary.  Additionally, you may be receiving a survey about your experience at our office within a few days to 1 week by e-mail or mail. We value your feedback.  Nobie Putnam, DO [place]

## 2020-08-17 NOTE — Assessment & Plan Note (Signed)
Well-controlled HTN - Home BP readings reviewed  Complication with CKD IV   Plan:  1. Continue current BP regimen  On Losartan 100mg  daily, HCTZ 12.5mg  daily, Amlodipine 5mg  daily 2. Encourage improved lifestyle - low sodium diet, regular exercise 3. Continue monitor BP outside office, bring readings to next visit, if persistently >140/90 or new symptoms notify office sooner  Future may reduce BP meds further

## 2020-08-17 NOTE — Assessment & Plan Note (Signed)
See A&P HTN Chronic progression CKD

## 2020-08-17 NOTE — Assessment & Plan Note (Signed)
Followed by [facility] Last Elevated D0V Complications - CKD IV, DM retinopathy other including hyperlipidemia,increases risk of future cardiovascular complications   Plan:  1. Continue current therapy - insulin management per Endocrine 2. Encourage improved lifestyle - low carb, low sugar diet, reduce portion size, continue improving regular exercise 3. Check CBG , bring log to next visit for review 4. Continue ASA, ARB, Statin 5. DM Foot exam done today  Request copy of last DM eye

## 2020-08-21 ENCOUNTER — Encounter: Payer: Self-pay | Admitting: Family Medicine

## 2020-09-03 ENCOUNTER — Ambulatory Visit (INDEPENDENT_AMBULATORY_CARE_PROVIDER_SITE_OTHER): Payer: Medicare HMO | Admitting: Family Medicine

## 2020-09-03 ENCOUNTER — Other Ambulatory Visit: Payer: Self-pay

## 2020-09-03 ENCOUNTER — Ambulatory Visit (INDEPENDENT_AMBULATORY_CARE_PROVIDER_SITE_OTHER): Payer: Medicare HMO | Admitting: Pharmacist

## 2020-09-03 ENCOUNTER — Encounter: Payer: Self-pay | Admitting: Family Medicine

## 2020-09-03 VITALS — BP 137/50 | HR 67 | Temp 97.3°F | Resp 16 | Ht 67.0 in | Wt 196.0 lb

## 2020-09-03 DIAGNOSIS — I129 Hypertensive chronic kidney disease with stage 1 through stage 4 chronic kidney disease, or unspecified chronic kidney disease: Secondary | ICD-10-CM

## 2020-09-03 DIAGNOSIS — K219 Gastro-esophageal reflux disease without esophagitis: Secondary | ICD-10-CM

## 2020-09-03 DIAGNOSIS — N184 Chronic kidney disease, stage 4 (severe): Secondary | ICD-10-CM

## 2020-09-03 DIAGNOSIS — R2689 Other abnormalities of gait and mobility: Secondary | ICD-10-CM

## 2020-09-03 DIAGNOSIS — R600 Localized edema: Secondary | ICD-10-CM | POA: Diagnosis not present

## 2020-09-03 DIAGNOSIS — Z794 Long term (current) use of insulin: Secondary | ICD-10-CM

## 2020-09-03 DIAGNOSIS — E119 Type 2 diabetes mellitus without complications: Secondary | ICD-10-CM

## 2020-09-03 MED ORDER — FAMOTIDINE 10 MG PO TABS: 10.0000 mg | ORAL_TABLET | Freq: Every day | ORAL | 3 refills | Status: DC

## 2020-09-03 NOTE — Chronic Care Management (AMB) (Signed)
Chronic Care Management   Follow Up Note   09/03/2020 Name: Stephen White MRN: [id number] DOB: 1936-03-28  Referred by: Olin Hauser, DO Reason for referral : No chief complaint on file.   Stephen White is a 84 y.o. year old male who is a primary care patient of Olin Hauser, DO. The CCM team was consulted for assistance with chronic disease management and care coordination needs.    I reached out to [name] and his wife by phone today.   Review of patient status, including review of consultants reports, relevant laboratory and other test results, and collaboration with appropriate care team members and the patient's provider was performed as part of comprehensive patient evaluation and provision of chronic care management services.    SDOH (Social Determinants of Health) assessments performed: No See Care Plan activities for detailed interventions related to [facility])     Outpatient Encounter Medications as of 09/03/2020  Medication Sig Note  . amLODipine (NORVASC) 5 MG tablet Take 5 mg by mouth at bedtime. 10/03/2015: Received from: External Pharmacy  . aspirin 81 MG tablet Take 81 mg by mouth daily.  05/05/2015: Received from: [facility]  . famotidine (PEPCID) 20 MG tablet TAKE 1 TABLET(20 MG) BY MOUTH TWICE DAILY   . gabapentin (NEURONTIN) 300 MG capsule Take 300 mg by mouth at bedtime as needed.   . hydrochlorothiazide (HYDRODIURIL) 25 MG tablet Take 12.5 mg by mouth daily. In AM (half dose)   . loratadine (ALLERGY RELIEF) 10 MG tablet Take 10 mg by mouth daily as needed for allergies.   Marland Kitchen losartan (COZAAR) 100 MG tablet Take 1 tablet (100 mg total) by mouth daily.   . MULTIPLE VITAMIN PO Take by mouth daily.  05/05/2015: Received from: [facility]  . pravastatin (PRAVACHOL) 10 MG tablet TAKE 1 TABLET BY MOUTH EVERY DAY   . Biotin (BIOTIN 5000) 5 MG CAPS Take by mouth daily.   . Carboxymethylcellulose Sodium (EYE  DROPS OP) Apply to eye.   . Continuous Blood Gluc Receiver (FREESTYLE LIBRE 14 DAY READER) DEVI by Does not apply route.   . Emollient (GOLD BOND ULTIMATE) LOTN Apply topically at bedtime. Diabetic lotion   . insulin aspart (NOVOLOG) 100 UNIT/ML FlexPen Inject 25 units BEFORE breakfast and supper. Inject 15 units BEFORE lunch.   . Insulin Detemir (LEVEMIR) 100 UNIT/ML Pen Inject 30-35 Units into the skin. 35 units in AM and 30 units in PM - depending on BS reading   . Insulin Lispro (HUMALOG KWIKPEN [place]) Inject into the skin as directed. As needed depending on BS readings twice daily Prescribed as 25 in AM, 15 at dinner and 25 at supper.    Marland Kitchen omeprazole (PRILOSEC) 20 MG capsule Take 20 mg by mouth daily.    . ONE TOUCH ULTRA TEST test strip 1 each by Other route as needed.  10/03/2015: Received from: External Pharmacy  . [DISCONTINUED] ibuprofen (ADVIL,MOTRIN) 600 MG tablet TAKE 1 TABLET(600 MG) BY MOUTH THREE TIMES DAILY AS NEEDED (Patient not taking: Reported on 09/03/2020)    No facility-administered encounter medications on file as of 09/03/2020.    Goals Addressed            This Visit's Progress   . PharmD - Medication Assistance       CARE PLAN ENTRY (see longitudinal plan of care for additional care plan information)  Current Barriers:  . Financial Barriers in complicated patient with multiple medical conditions including T2DM, CKD,  GERD, HTN, HLD; patient has [employer] and reports copay for [place] is cost prohibitive at this time  Pharmacist Clinical Goal(s):  Marland Kitchen Over the next 30 days, patient will work with PharmD and providers to relieve medication access concerns  Interventions: . Review medication assistance options with patient o Based on reported income, patient not eligible for Extra Help Subsidy o Based on reported income, patient meets requirements for: - Patient Assistance Program for [employer] and Novolog through [employer] . Will  collaborate with [facility] CPhT to assist patient with applying for patient assistance for Levemir and Novolog through [employer] . START comprehensive medication review with patient and wife (medication list updated in electronic medical record) - UNABLE TO COMPLETE medication review today as patient has scheduled appointment with PCP to address swelling in feet/legs o Identify patient taking diphenhydramine OTC daily as needed for allergy symptoms. Patient and wife reports patient has recently had dizziness on and off. Advise patient to STOP taking diphenhydramine for allergies. - Wife reports patient previously taking loratadine daily as needed for allergies and will resume taking. o Counsel to monitor for dizziness/sedation with gabapentin and loratadine . Review need for renal dosing: o Per shared record lab values from 06/10/20: - Creatinine 2.9 mg/dL - Calculated CrCl (using adjusted body weight): ~21 mL/min o Reports currently taking famotidine (Pepcid) 20 mg twice daily.  - Based on package labeling for Pepcid, if usual dose 20 mg twice daily, recommends dose adjustment to 10 mg once daily or 20 mg every other day for CrCl < 30 mL/min . Counsel patient to follow up with PCP or CM Pharmacist prior to starting OTC medications to review for interactions/side effects/dosing concerns . Schedule follow up visit with patient to complete medication review . Collaborate with PCP regarding: o Recommendation from CM Pharmacist for patient to stop diphenhydramine due to risk of dizziness/sedation o Renal dose adjustment of famotidine  Patient Self Care Activities:  . Patient will provide necessary portions of application   Initial goal documentation        Plan  Telephone follow up appointment with care management team member scheduled for: 9/22 at 12 pm  Harlow Asa, PharmD, [place] [phone]

## 2020-09-03 NOTE — Patient Instructions (Signed)
Thank you allowing the Chronic Care Management Team to be a part of your care! It was a pleasure speaking with you today!     CCM (Chronic Care Management) Team    Noreene Larsson RN, MSN, CCM Nurse Care Coordinator  [phone]   Harlow Asa PharmD  Clinical Pharmacist  [phone]   Eula Fried LCSW Clinical Social Worker [phone]  Visit Information  Goals Addressed            This Visit's Progress   . PharmD - Medication Assistance       CARE PLAN ENTRY (see longitudinal plan of care for additional care plan information)  Current Barriers:  . Financial Barriers in complicated patient with multiple medical conditions including T2DM, CKD, GERD, HTN, HLD; patient has [employer] and reports copay for [place] is cost prohibitive at this time  Pharmacist Clinical Goal(s):  Marland Kitchen Over the next 30 days, patient will work with PharmD and providers to relieve medication access concerns  Interventions: . Review medication assistance options with patient o Based on reported income, patient not eligible for Extra Help Subsidy o Based on reported income, patient meets requirements for: - Patient Assistance Program for [employer] and Novolog through [employer] . Will collaborate with [facility] CPhT to assist patient with applying for patient assistance for Levemir and Novolog through [employer] . START comprehensive medication review with patient and wife (medication list updated in electronic medical record) - UNABLE TO COMPLETE medication review today as patient has scheduled appointment with PCP to address swelling in feet/legs o Identify patient taking diphenhydramine OTC daily as needed for allergy symptoms. Patient and wife reports patient has recently had dizziness on and off. Advise patient to STOP taking diphenhydramine for allergies. - Wife reports patient previously taking loratadine daily as needed for allergies and will resume taking. o Counsel to  monitor for dizziness/sedation with gabapentin and loratadine . Review need for renal dosing: o Per shared record lab values from 06/10/20: - Creatinine 2.9 mg/dL - Calculated CrCl (using adjusted body weight): ~21 mL/min o Reports currently taking famotidine (Pepcid) 20 mg twice daily.  - Based on package labeling for Pepcid, if usual dose 20 mg twice daily, recommends dose adjustment to 10 mg once daily or 20 mg every other day for CrCl < 30 mL/min . Counsel patient to follow up with PCP or CM Pharmacist prior to starting OTC medications to review for interactions/side effects/dosing concerns . Schedule follow up visit with patient to complete medication review . Collaborate with PCP regarding: o Recommendation from CM Pharmacist for patient to stop diphenhydramine due to risk of dizziness/sedation o Renal dose adjustment of famotidine  Patient Self Care Activities:  . Patient will provide necessary portions of application   Initial goal documentation        Patient verbalizes understanding of instructions provided today.   Telephone follow up appointment with care management team member scheduled for: 9/22 at 12 pm  Harlow Asa, PharmD, [place] [phone]

## 2020-09-03 NOTE — Progress Notes (Signed)
Subjective:    Patient ID: Stephen White, male    DOB: 1936/05/15, 84 y.o.   MRN: [id number]  DAVIT VASSAR is a 84 y.o. male presenting on 09/03/2020 for Foot Swelling (both side onset last week)   HPI   Lower Extremity Edema, bilateral foot/ankle  He spoke with Harlow Asa [place] today, they will talk again next week It was identified that he had been taking OTC diphenhydramine fairly regularly - he was unaware this was same as benadryl. - Admits he has some reduced exercise tolerance, describes for years would feel "breathing heavier" when active - Admits some higher sodium at times in diet he will plan to get salt substitute for food. Admits some prepackaged crackers with sodium.  Today reports recent swelling in past week, no redness, seems both sides same, no injury or fall. He has had some swelling before but this was worse.  Has seen Cardiology in past 04/2020, thought to be unlikely Cardiac based on his dyspnea and dizzy symptoms in past had work up. No ECHO on file  He is not feeling dizzy today, he did not take diphenhydramine today.  HTN / CKD-IV Controlled BP recently  with some slight progression has been relatively stable >4+ years. Based on results Creatinine GFR < 30. On Losartan 100mg  daily, HCTZ 12.5mg  daily, Amlodipine 5mg  daily    Depression screen [facility] 2/9 08/15/2020 08/12/2020 04/01/2020  Decreased Interest 0 0 0  Down, Depressed, Hopeless 0 0 0  PHQ - 2 Score 0 0 0    Social History   Tobacco Use  . Smoking status: Former Smoker    Packs/day: 0.30    Years: 25.00    Pack years: 7.50    Types: Cigarettes    Quit date: 12/19/1978    Years since quitting: 41.7  . Smokeless tobacco: Former Network engineer  . Vaping Use: Never used  Substance Use Topics  . Alcohol use: Yes    Alcohol/week: 0.0 standard drinks  . Drug use: No    Review of Systems Per HPI unless specifically indicated above     Objective:    BP (!) 137/50    Pulse 67   Temp (!) 97.3 F (36.3 C) (Temporal)   Resp 16   Ht 5\' 7"  (1.702 m)   Wt 196 lb (88.9 kg)   SpO2 96%   BMI 30.70 kg/m   Wt Readings from Last 3 Encounters:  09/03/20 196 lb (88.9 kg)  08/15/20 195 lb 9.6 oz (88.7 kg)  08/12/20 195 lb (88.5 kg)    Physical Exam Vitals and nursing note reviewed.  Constitutional:      General: He is not in acute distress.    Appearance: He is well-developed. He is not diaphoretic.     Comments: Well-appearing, comfortable, cooperative  HENT:     Head: Normocephalic and atraumatic.  Eyes:     General:        Right eye: No discharge.        Left eye: No discharge.     Conjunctiva/sclera: Conjunctivae normal.  Neck:     Thyroid: No thyromegaly.  Cardiovascular:     Rate and Rhythm: Normal rate and regular rhythm.     Heart sounds: Normal heart sounds. No murmur heard.   Pulmonary:     Effort: Pulmonary effort is normal. No respiratory distress.     Breath sounds: Normal breath sounds. No wheezing or rales.  Musculoskeletal:  General: Normal range of motion.     Cervical back: Normal range of motion and neck supple.     Right lower leg: Edema (+2 pitting edema) present.     Left lower leg: Edema (+2 pitting edema) present.  Lymphadenopathy:     Cervical: No cervical adenopathy.  Skin:    General: Skin is warm and dry.     Findings: No erythema or rash.  Neurological:     Mental Status: He is alert and oriented to person, place, and time.  Psychiatric:        Behavior: Behavior normal.     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 Retinopathy      Assessment & Plan:   Problem List Items Addressed This Visit    Gastro-esophageal reflux disease without esophagitis   Relevant Medications   famotidine (PEPCID) 10 MG tablet   CKD (chronic  kidney disease), stage IV (HCC)   Benign hypertension with CKD (chronic kidney disease) stage IV ([place]) - Primary    Other Visit Diagnoses    Balance disorder       Bilateral lower extremity edema          Swelling in feet / ankles - likely from several factors - Higher sodium / salt can cause swelling - Amlodipine 5mg  (BP med) can cause swelling - major side effect  STOP taking Amlodipine 5mg  INCREASE Hydrochlorothiazide from 12.5mg  to 25mg  - (now take a whole pill instead of one half)  REDUCE Famotodine from 20mg  twice a day for heartburn down to new rx 10mg  once a day. DUE to CKD dosing  Stop Diphenhydramine (Benadryl, generic) - this can make you dizzy. Suspect this was one of major causes of his chronic or persistent dizziness for while on previous visits, now reported on OTC med list through pharmacy med rec today.  Reduce sodium intake in diet RICE therapy as recommended, compression elevation  Check chemistry CMET within 1 week Will watch renal function closely on higher thiazide  Next call to [facility] next week, can review his med rec and get update on his swelling, see if amlodipine side effect was causing.  Future follow-up reconsider ECHO if not improving swelling, or can be due to CKD.  Meds ordered this encounter  Medications  . famotidine (PEPCID) 10 MG tablet    Sig: Take 1 tablet (10 mg total) by mouth daily.    Dispense:  90 tablet    Refill:  3    **Patient requests 90 days supply** New order rx sent for 10mg  instead of 20mg      Follow up plan: Return if symptoms worsen or fail to improve.    Nobie Putnam, [facility] 09/03/2020, 3:44 PM

## 2020-09-03 NOTE — Patient Instructions (Addendum)
Thank you for coming to the office today.  Swelling in feet / ankles - likely from several factors - Higher sodium / salt can cause swelling - Amlodipine 5mg  (BP med) can cause swelling - major side effect  STOP taking Amlodipine 5mg  INCREASE Hydrochlorothiazide from 12.5mg  to 25mg  - (now take a whole pill instead of one half)  REDUCE Famotodine from 20mg  twice a day for heartburn down to new rx 10mg  once a day.  Stop Diphenhydramine (Benadryl, generic) - this can make you dizzy.  DUE for NON FASTING BLOOD WORK   SCHEDULE "Lab Only" visit in the morning at the clinic for lab draw on Mon / Tues next week  - Make sure Lab Only appointment is at about 1 week before your next appointment, so that results will be available  For Lab Results, once available within 2-3 days of blood draw, you can can log in to MyChart online to view your results and a brief explanation. Also, we can discuss results at next follow-up visit.  Please schedule a Follow-up Appointment to: Return for non fasting lab only Mon or Tues - keep other apt in November.  If you have any other questions or concerns, please feel free to call the office or send a message through [place]. You may also schedule an earlier appointment if necessary.  Additionally, you may be receiving a survey about your experience at our office within a few days to 1 week by e-mail or mail. We value your feedback.  Nobie Putnam, DO [place]

## 2020-09-08 ENCOUNTER — Other Ambulatory Visit: Payer: Self-pay

## 2020-09-08 ENCOUNTER — Other Ambulatory Visit: Payer: Medicare HMO

## 2020-09-09 LAB — COMPLETE METABOLIC PANEL WITH GFR
AG Ratio: 1.4 (calc) (ref 1.0–2.5)
ALT: 17 U/L (ref 9–46)
AST: 19 U/L (ref 10–35)
Albumin: 3.8 g/dL (ref 3.6–5.1)
Alkaline phosphatase (APISO): 47 U/L (ref 35–144)
BUN/Creatinine Ratio: 15 (calc) (ref 6–22)
BUN: 38 mg/dL — ABNORMAL HIGH (ref 7–25)
CO2: 26 mmol/L (ref 20–32)
Calcium: 9 mg/dL (ref 8.6–10.3)
Chloride: 102 mmol/L (ref 98–110)
Creat: 2.62 mg/dL — ABNORMAL HIGH (ref 0.70–1.11)
GFR, Est African American: 25 mL/min/{1.73_m2} — ABNORMAL LOW (ref >=60)
GFR, Est Non African American: 22 mL/min/{1.73_m2} — ABNORMAL LOW (ref >=60)
Globulin: 2.8 g/dL (calc) (ref 1.9–3.7)
Glucose, Bld: 208 mg/dL — ABNORMAL HIGH (ref 65–99)
Potassium: 4.9 mmol/L (ref 3.5–5.3)
Sodium: 136 mmol/L (ref 135–146)
Total Bilirubin: 0.5 mg/dL (ref 0.2–1.2)
Total Protein: 6.6 g/dL (ref 6.1–8.1)

## 2020-09-10 ENCOUNTER — Other Ambulatory Visit: Payer: Self-pay | Admitting: Family Medicine

## 2020-09-10 ENCOUNTER — Ambulatory Visit: Payer: Self-pay | Admitting: Pharmacist

## 2020-09-10 DIAGNOSIS — N184 Chronic kidney disease, stage 4 (severe): Secondary | ICD-10-CM | POA: Diagnosis not present

## 2020-09-10 DIAGNOSIS — Z794 Long term (current) use of insulin: Secondary | ICD-10-CM

## 2020-09-10 DIAGNOSIS — I129 Hypertensive chronic kidney disease with stage 1 through stage 4 chronic kidney disease, or unspecified chronic kidney disease: Secondary | ICD-10-CM | POA: Diagnosis not present

## 2020-09-10 DIAGNOSIS — R600 Localized edema: Secondary | ICD-10-CM

## 2020-09-10 DIAGNOSIS — E119 Type 2 diabetes mellitus without complications: Secondary | ICD-10-CM | POA: Diagnosis not present

## 2020-09-10 MED ORDER — HYDROCHLOROTHIAZIDE 25 MG PO TABS: 25.0000 mg | ORAL_TABLET | Freq: Every day | ORAL | 1 refills | Status: DC

## 2020-09-10 NOTE — Patient Instructions (Signed)
Thank you allowing the Chronic Care Management Team to be a part of your care! It was a pleasure speaking with you today!     CCM (Chronic Care Management) Team    Noreene Larsson RN, MSN, CCM Nurse Care Coordinator  [phone]   Harlow Asa PharmD  Clinical Pharmacist  [phone]   Eula Fried LCSW Clinical Social Worker [phone]  Visit Information  Goals Addressed            This Visit's Progress   . PharmD - Medication Assistance       CARE PLAN ENTRY (see longitudinal plan of care for additional care plan information)  Current Barriers:  . Financial Barriers in complicated patient with multiple medical conditions including T2DM, CKD, GERD, HTN, HLD; patient has [employer] and reports copay for [place] is cost prohibitive at this time  Pharmacist Clinical Goal(s):  Marland Kitchen Over the next 30 days, patient will work with PharmD and providers to relieve medication access concerns  Interventions: . Collaborated with PCP regarding: o Recommendation from CM Pharmacist for patient to stop diphenhydramine due to risk of dizziness/sedation o Renal dose adjustment of famotidine . Perform chart review . Complete comprehensive medication review with patient and wife (medication list updated in electronic medical record)  o Previously identified patient taking diphenhydramine OTC daily as needed for allergy symptoms. Patient confirms he has stopped taking all OTC products containing diphenhydramine (Benadryl)  - Reports dizziness improved with stopping diphenhydramine - Reports previously took and planning to resume: loratadine daily as needed for allergies  o Counsel to monitor for dizziness/sedation with gabapentin and loratadine o Patient denies having decreased famotidine dosing as recommended by PCP for renal dose adjustment. - Advise patient to decrease dose to famotidine 10 mg once daily as directed and that new Rx has been called into  pharmacy. Myles Rosenthal on importance of blood pressure control and monitoring o Note on 9/15, due to lower extremity edema, PCP advised patient to STOP taking amlodipine 5mg  & INCREASE HCTZ from 12.5 mg to 25 mg daily  o Reports taking: - Losartan 100 mg - 1 tablet daily - HCTZ 25 mg - 1/2 tablet daily - Confirms no longer taking amlodipine . Reports swelling resolved since stopped amlodipine o Today counsel patient to increase HCTZ dose to 25 mg (1 tablet) daily as directed by PCP  - Patient requests refill of HCTZ Rx - Collaborate with FNP Cyndia Skeeters (covering today for PCP) to request new Rx sent to pharmacy . Counsel on importance of blood sugar control o From review of chart, note patient last seen by Endocrinologist on 06/17/20 o Patient reports taking: - Levemir 24 units nightly - Novolog 20 units before breakfast; 15 units before lunch; and 20 units before supper . Reports sometimes takes additional units at bedtime if notices CBG elevated o Discuss patient not taking insulin as directed. Patient reviews the above instructions from AVS from latest Endocrinologist visit as we talk. Reports he has not been following provider's direction due to fear of hypoglycemia. - Reports had an episode of severe hypoglycemia requiring EMS about a year ago. - Counsel patient on s/s of low blood sugar and how to treat lows - Review rules of 15s - importance of using 15 grams of sugar to treat low, recheck blood sugar in 15 minutes, treat again if remains low or, if back to normal, having meal if mealtime or snack  - Review examples of sources of 15 grams of  sugar - Discuss rational for/importance of blood sugar control.  - Note patient has upcoming appointment with Endocrinologist on 10/6 - Encourage patient to be open with Endocrinologist [id number] for risk of lows and collaborate with provider o Patient using Freestyle Libre continuous blood glucose monitor - Reports morning CBG today:  173 - Reports bedtime CBG yesterday: 249 . Counsel patient to follow up with PCP or CM Pharmacist prior to starting OTC medications to review for interactions/side effects/dosing concerns . Counsel on importance of medication adherence o Patient reports using weekly pillbox as adherence aid . Follow up with patient regarding [name] medication assistance application o Patient denies having received in mail yet o Will continue to collaborate with [facility] CPhT to assist patient with applying for patient assistance for Levemir and Novolog through [employer] . Place coordination of care call to [facility]. Speak with Janett Billow, CMA with Dr. Gabriel Carina . Will collaborate with PCP  Patient Self Care Activities:  . Patient will provide necessary portions of application   Please see past updates related to this goal by clicking on the "Past Updates" button in the selected goal         Patient verbalizes understanding of instructions provided today.   Telephone follow up appointment with care management team member scheduled for: 10/11 at 2:45 pm  Harlow Asa, PharmD, [place] [phone]

## 2020-09-10 NOTE — Chronic Care Management (AMB) (Signed)
Chronic Care Management   Follow Up Note   09/10/2020 Name: Stephen White MRN: [id number] DOB: 1936-01-24  Referred by: Olin Hauser, DO Reason for referral : Chronic Care Management (Patient Phone Call)   Stephen White is a 84 y.o. year old male who is a primary care patient of Olin Hauser, DO. The CCM team was consulted for assistance with chronic disease management and care coordination needs.    I reached out to [name] by phone today.   Review of patient status, including review of consultants reports, relevant laboratory and other test results, and collaboration with appropriate care team members and the patient's provider was performed as part of comprehensive patient evaluation and provision of chronic care management services.    SDOH (Social Determinants of Health) assessments performed: No See Care Plan activities for detailed interventions related to [facility])     Outpatient Encounter Medications as of 09/10/2020  Medication Sig Note  . aspirin 81 MG tablet Take 81 mg by mouth daily.  05/05/2015: Received from: [facility]  . Biotin (BIOTIN 5000) 5 MG CAPS Take by mouth daily.   . Emollient (GOLD BOND ULTIMATE) LOTN Apply topically at bedtime. Diabetic lotion   . famotidine (PEPCID) 10 MG tablet Take 1 tablet (10 mg total) by mouth daily.   Marland Kitchen gabapentin (NEURONTIN) 300 MG capsule Take 300 mg by mouth at bedtime as needed.   . Insulin Detemir (LEVEMIR) 100 UNIT/ML Pen Inject 24 Units into the skin at bedtime.    Marland Kitchen losartan (COZAAR) 100 MG tablet Take 1 tablet (100 mg total) by mouth daily.   . MULTIPLE VITAMIN PO Take by mouth daily.  05/05/2015: Received from: [facility]  . pravastatin (PRAVACHOL) 10 MG tablet TAKE 1 TABLET BY MOUTH EVERY DAY   . [DISCONTINUED] hydrochlorothiazide (HYDRODIURIL) 25 MG tablet Take 25 mg by mouth daily.   . Carboxymethylcellulose Sodium (EYE DROPS OP) Apply to eye. (Patient not  taking: Reported on 09/10/2020)   . Continuous Blood Gluc Receiver (FREESTYLE LIBRE 14 DAY READER) DEVI by Does not apply route.   . insulin aspart (NOVOLOG) 100 UNIT/ML FlexPen Inject 25 units BEFORE breakfast and supper. Inject 15 units BEFORE lunch.   . loratadine (ALLERGY RELIEF) 10 MG tablet Take 10 mg by mouth daily as needed for allergies.   . ONE TOUCH ULTRA TEST test strip 1 each by Other route as needed.  10/03/2015: Received from: External Pharmacy  . [DISCONTINUED] Insulin Lispro (HUMALOG KWIKPEN [place]) Inject into the skin as directed. As needed depending on BS readings twice daily Prescribed as 25 in AM, 15 at dinner and 25 at supper.    . [DISCONTINUED] omeprazole (PRILOSEC) 20 MG capsule Take 20 mg by mouth daily.  (Patient not taking: Reported on 09/10/2020)    No facility-administered encounter medications on file as of 09/10/2020.    Goals Addressed            This Visit's Progress   . PharmD - Medication Assistance       CARE PLAN ENTRY (see longitudinal plan of care for additional care plan information)  Current Barriers:  . Financial Barriers in complicated patient with multiple medical conditions including T2DM, CKD, GERD, HTN, HLD; patient has [employer] and reports copay for [place] is cost prohibitive at this time  Pharmacist Clinical Goal(s):  Marland Kitchen Over the next 30 days, patient will work with PharmD and providers to relieve medication access concerns  Interventions: .  Collaborated with PCP regarding: o Recommendation from CM Pharmacist for patient to stop diphenhydramine due to risk of dizziness/sedation o Renal dose adjustment of famotidine . Perform chart review . Complete comprehensive medication review with patient and wife (medication list updated in electronic medical record)  o Previously identified patient taking diphenhydramine OTC daily as needed for allergy symptoms. Patient confirms he has stopped taking all OTC products  containing diphenhydramine (Benadryl)  - Reports dizziness improved with stopping diphenhydramine - Reports previously took and planning to resume: loratadine daily as needed for allergies  o Counsel to monitor for dizziness/sedation with gabapentin and loratadine o Patient denies having decreased famotidine dosing as recommended by PCP for renal dose adjustment. - Advise patient to decrease dose to famotidine 10 mg once daily as directed and that new Rx has been called into pharmacy. Myles Rosenthal on importance of blood pressure control and monitoring o Note on 9/15, due to lower extremity edema, PCP advised patient to STOP taking amlodipine 5mg  & INCREASE HCTZ from 12.5 mg to 25 mg daily  o Reports taking: - Losartan 100 mg - 1 tablet daily - HCTZ 25 mg - 1/2 tablet daily - Confirms no longer taking amlodipine . Reports swelling resolved since stopped amlodipine o Today counsel patient to increase HCTZ dose to 25 mg (1 tablet) daily as directed by PCP  - Patient requests refill of HCTZ Rx - Collaborate with FNP Cyndia Skeeters (covering today for PCP) to request new Rx sent to pharmacy . Counsel on importance of blood sugar control o From review of chart, note patient last seen by Endocrinologist on 06/17/20 and advised: "Adjust the Levemir to 24 units at bedtime.   Take NovoLog insulin 10-15 minutes BEFORE you eat.  Take NovoLog 28 units before breakfast Take NovoLog 15 units before lunch. Take NovoLog 32 units before supper." o Patient reports taking: - Levemir 24 units nightly - Novolog 20 units before breakfast; 15 units before lunch; and 20 units before supper . Reports sometimes takes additional units at bedtime if notices CBG elevated o Discuss patient not taking insulin as directed. Patient reviews the above instructions from AVS from latest Endocrinologist visit as we talk. Reports he has not been following provider's direction due to fear of hypoglycemia. - Reports had an episode of  severe hypoglycemia requiring EMS about a year ago. - Counsel patient on s/s of low blood sugar and how to treat lows - Review rules of 15s - importance of using 15 grams of sugar to treat low, recheck blood sugar in 15 minutes, treat again if remains low or, if back to normal, having meal if mealtime or snack  - Review examples of sources of 15 grams of sugar - Discuss rational for/importance of blood sugar control.  - Note patient has upcoming appointment with Endocrinologist on 10/6 - Encourage patient to be open with Endocrinologist [id number] for risk of lows and collaborate with provider o Patient using Freestyle Libre continuous blood glucose monitor - Reports morning CBG today: 173 - Reports bedtime CBG yesterday: 249 . Counsel patient to follow up with PCP or CM Pharmacist prior to starting OTC medications to review for interactions/side effects/dosing concerns . Counsel on importance of medication adherence o Patient reports using weekly pillbox as adherence aid . Follow up with patient regarding [name] medication assistance application o Patient denies having received in mail yet o Will continue to collaborate with [facility] CPhT to assist patient with applying for patient assistance for Levemir and Novolog through  [employer] . Place coordination of care call to [facility]. Speak with Janett Billow, CMA with Dr. Gabriel Carina . Will collaborate with PCP  Patient Self Care Activities:  . Patient will provide necessary portions of application   Please see past updates related to this goal by clicking on the "Past Updates" button in the selected goal         Plan  Telephone follow up appointment with care management team member scheduled for: 10/11 at 2:45 pm  Harlow Asa, PharmD, [place] [phone]

## 2020-09-15 ENCOUNTER — Ambulatory Visit: Payer: Self-pay | Admitting: Pharmacist

## 2020-09-15 DIAGNOSIS — Z794 Long term (current) use of insulin: Secondary | ICD-10-CM

## 2020-09-15 DIAGNOSIS — I129 Hypertensive chronic kidney disease with stage 1 through stage 4 chronic kidney disease, or unspecified chronic kidney disease: Secondary | ICD-10-CM

## 2020-09-15 DIAGNOSIS — N184 Chronic kidney disease, stage 4 (severe): Secondary | ICD-10-CM | POA: Diagnosis not present

## 2020-09-15 DIAGNOSIS — E119 Type 2 diabetes mellitus without complications: Secondary | ICD-10-CM

## 2020-09-15 NOTE — Patient Instructions (Addendum)
Thank you allowing the Chronic Care Management Team to be a part of your care! It was a pleasure speaking with you today!     CCM (Chronic Care Management) Team    Noreene Larsson RN, MSN, CCM Nurse Care Coordinator  [phone]   Harlow Asa PharmD  Clinical Pharmacist  [phone]   Eula Fried LCSW Clinical Social Worker [phone]  Visit Information  Goals Addressed            This Visit's Progress   . PharmD - Medication Assistance       CARE PLAN ENTRY (see longitudinal plan of care for additional care plan information)  Current Barriers:  . Financial Barriers in complicated patient with multiple medical conditions including T2DM, CKD, GERD, HTN, HLD; patient has [employer] and reports copay for [place] is cost prohibitive at this time  Pharmacist Clinical Goal(s):  Marland Kitchen Over the next 30 days, patient will work with PharmD and providers to relieve medication access concerns  Interventions: . Receive incoming call from [place] letting me know that patient reporting having stopped taking his Novolog insulin. . Place call to patient, who reports recently had an episode of "feeling drunk"/staggering at breakfast time after taking his Novolog. Reports he read online about side effects of Novolog and attributed episode to the insulin. Reports he stopped taking Novolog and instead started Novolin R (purchased OTC from [employer]) with meals yesterday. o Advise patient against self-adjusting insulin regimen without direction from provider o Note patient reports had been taking Novolog ~30 minutes prior to meals. - Counsel patient that Novolog is a rapid-acting insulin and should be taken within 10 minutes of meal.  . Patient verbalizes understanding via teach back.  - Advise patient to keep out his recent AVS from visit with Endocrinologist for written reminder of this timing . o Confirms will discard Novolin R and resume taking  Novolog with meals as directed by Endocrinologist o Confirms continuing to take Levemir 24 units nightly as directed o Note patient using Freestyle Libre continuous blood glucose monitor - Denies recent low blood sugars - Reports CBG last night at bedtime: 206 - Reports morning CBG today: 174 o Have reviewed with patient s/s of low blood sugar and how to manage lows o Note patient has upcoming appointment with Endocrinologist on 10/6 - Encourage patient to be open with Endocrinologist [id number] for risk of lows and collaborate with provider o Encourage patient to call Endocrinologist office sooner for readings outside of established parameters or other concerns related to his blood sugar . Counsel on importance of blood pressure control and monitoring o Reports taking: - Losartan 100 mg daily - HCTZ 25 mg daily - Confirms no longer taking amlodipine o Denies having checked home BP since started increased dose of HCTZ. Advise patient to resume home BP monitoring, keep log and contact office for readings outside of established parameters . Counsel patient to follow up with provider office for any further episodes of dizziness or new or worsening medical concerns o Patient again confirms no longer taking any OTC products containing diphenhydramine (Benadryl)  - Note patient previously had reported having dizziness that resolved after stopping these products o Encourage patient to use caution to avoid falls if experiences any episodes of dizziness - Encourage patient to check CBG and BP once seated . Will continue to collaborate with [facility] CPhT to assist patient with applying for patient assistance for Levemir and Novolog through [employer] o  Patient reports currently working on completing application. Confirms will follow up with [facility] CPhT for any questions. o Reports has a good supply of both Levemir and Novolog remaining at this time.   Place coordination of care call to [facility]  Endocrinology. Speak with Janett Billow, CMA with Dr. Gabriel Carina . Will collaborate with PCP  Patient Self Care Activities:  . Patient will provide necessary portions of application   Please see past updates related to this goal by clicking on the "Past Updates" button in the selected goal         Patient verbalizes understanding of instructions provided today.   Telephone follow up appointment with care management team member scheduled for: 10/11 at 2:45 pm  Harlow Asa, PharmD, [place] [phone]

## 2020-09-15 NOTE — Chronic Care Management (AMB) (Addendum)
Chronic Care Management   Follow Up Note   09/15/2020 Name: Stephen White MRN: [id number] DOB: 01-05-1936  Referred by: Olin Hauser, DO Reason for referral : Chronic Care Management (Patient Phone Call)   COLTIN CASHER is a 84 y.o. year old male who is a primary care patient of Olin Hauser, DO. The CCM team was consulted for assistance with chronic disease management and care coordination needs.    Receive incoming coordination of care call from [place].  I reached out to [name] by phone today.   Review of patient status, including review of consultants reports, relevant laboratory and other test results, and collaboration with appropriate care team members and the patient's provider was performed as part of comprehensive patient evaluation and provision of chronic care management services.    SDOH (Social Determinants of Health) assessments performed: No See Care Plan activities for detailed interventions related to [facility])     Outpatient Encounter Medications as of 09/15/2020  Medication Sig Note  . hydrochlorothiazide (HYDRODIURIL) 25 MG tablet Take 1 tablet (25 mg total) by mouth daily.   Marland Kitchen aspirin 81 MG tablet Take 81 mg by mouth daily.  05/05/2015: Received from: [facility]  . Biotin (BIOTIN 5000) 5 MG CAPS Take by mouth daily.   . Carboxymethylcellulose Sodium (EYE DROPS OP) Apply to eye. (Patient not taking: Reported on 09/10/2020)   . Continuous Blood Gluc Receiver (FREESTYLE LIBRE 14 DAY READER) DEVI by Does not apply route.   . Emollient (GOLD BOND ULTIMATE) LOTN Apply topically at bedtime. Diabetic lotion   . famotidine (PEPCID) 10 MG tablet Take 1 tablet (10 mg total) by mouth daily.   Marland Kitchen gabapentin (NEURONTIN) 300 MG capsule Take 300 mg by mouth at bedtime as needed.   . insulin aspart (NOVOLOG) 100 UNIT/ML FlexPen Inject 25 units BEFORE breakfast and supper. Inject 15 units BEFORE lunch.   . Insulin Detemir (LEVEMIR)  100 UNIT/ML Pen Inject 24 Units into the skin at bedtime.    Marland Kitchen loratadine (ALLERGY RELIEF) 10 MG tablet Take 10 mg by mouth daily as needed for allergies.   Marland Kitchen losartan (COZAAR) 100 MG tablet Take 1 tablet (100 mg total) by mouth daily.   . MULTIPLE VITAMIN PO Take by mouth daily.  05/05/2015: Received from: [facility]  . ONE TOUCH ULTRA TEST test strip 1 each by Other route as needed.  10/03/2015: Received from: External Pharmacy  . pravastatin (PRAVACHOL) 10 MG tablet TAKE 1 TABLET BY MOUTH EVERY DAY    No facility-administered encounter medications on file as of 09/15/2020.    Goals Addressed            This Visit's Progress   . PharmD - Medication Assistance       CARE PLAN ENTRY (see longitudinal plan of care for additional care plan information)  Current Barriers:  . Financial Barriers in complicated patient with multiple medical conditions including T2DM, CKD, GERD, HTN, HLD; patient has [employer] and reports copay for [place] is cost prohibitive at this time  Pharmacist Clinical Goal(s):  Marland Kitchen Over the next 30 days, patient will work with PharmD and providers to relieve medication access concerns  Interventions: . Receive incoming call from [place]cox letting me know that patient reporting having stopped taking his Novolog insulin. . Place call to patient, who reports recently had an episode of "feeling drunk"/staggering at breakfast time after taking his Novolog. Reports he read online  about side effects of Novolog and attributed episode to the insulin. Reports he stopped taking Novolog and instead started Novolin R (purchased OTC from [employer]) with meals yesterday. o Advise patient against self-adjusting insulin regimen without direction from provider o Note patient reports had been taking Novolog ~30 minutes prior to meals. - Counsel patient that Novolog is a rapid-acting insulin and should be taken within 10 minutes  of meal.  . Patient verbalizes understanding via teach back.  - Advise patient to keep out his recent AVS from visit with Endocrinologist for written reminder of this timing . o Confirms will discard Novolin R and resume taking Novolog with meals as directed by Endocrinologist o Confirms continuing to take Levemir 24 units nightly as directed o Note patient using Freestyle Libre continuous blood glucose monitor - Denies recent low blood sugars - Reports CBG last night at bedtime: 206 - Reports morning CBG today: 174 o Have reviewed with patient s/s of low blood sugar and how to manage lows o Note patient has upcoming appointment with Endocrinologist on 10/6 - Encourage patient to be open with Endocrinologist [id number] for risk of lows and collaborate with provider o Encourage patient to call Endocrinologist office sooner for readings outside of established parameters or other concerns related to his blood sugar . Counsel on importance of blood pressure control and monitoring o Reports taking: - Losartan 100 mg daily - HCTZ 25 mg daily - Confirms no longer taking amlodipine o Denies having checked home BP since started increased dose of HCTZ. Advise patient to resume home BP monitoring, keep log and contact office for readings outside of established parameters . Counsel patient to follow up with provider office for any further episodes of dizziness or new or worsening medical concerns o Patient again confirms no longer taking any OTC products containing diphenhydramine (Benadryl)  - Note patient previously had reported having dizziness that resolved after stopping these products o Encourage patient to use caution to avoid falls if experiences any episodes of dizziness - Encourage patient to check CBG and BP once seated . Will continue to collaborate with [facility] CPhT to assist patient with applying for patient assistance for Levemir and Novolog through [employer] o Patient reports currently  working on completing application. Confirms will follow up with [facility] CPhT for any questions. o Reports has a good supply of both Levemir and Novolog remaining at this time.   Place coordination of care call to [facility] Endocrinology. Speak with Janett Billow, CMA with Dr. Gabriel Carina . Will collaborate with PCP  Patient Self Care Activities:  . Patient will provide necessary portions of application   Please see past updates related to this goal by clicking on the "Past Updates" button in the selected goal         Plan  Telephone follow up appointment with care management team member scheduled for: 10/11 at 2:45 pm  Harlow Asa, PharmD, [place] [phone]

## 2020-09-17 LAB — HEMOGLOBIN A1C: Hemoglobin A1C: 8.4

## 2020-09-29 ENCOUNTER — Telehealth: Payer: Self-pay

## 2020-10-01 ENCOUNTER — Ambulatory Visit: Payer: Self-pay | Admitting: Pharmacist

## 2020-10-01 DIAGNOSIS — E119 Type 2 diabetes mellitus without complications: Secondary | ICD-10-CM

## 2020-10-01 DIAGNOSIS — N184 Chronic kidney disease, stage 4 (severe): Secondary | ICD-10-CM

## 2020-10-01 DIAGNOSIS — E1169 Type 2 diabetes mellitus with other specified complication: Secondary | ICD-10-CM

## 2020-10-01 DIAGNOSIS — Z794 Long term (current) use of insulin: Secondary | ICD-10-CM

## 2020-10-01 DIAGNOSIS — E785 Hyperlipidemia, unspecified: Secondary | ICD-10-CM

## 2020-10-01 DIAGNOSIS — I129 Hypertensive chronic kidney disease with stage 1 through stage 4 chronic kidney disease, or unspecified chronic kidney disease: Secondary | ICD-10-CM

## 2020-10-01 NOTE — Chronic Care Management (AMB) (Signed)
Chronic Care Management   Follow Up Note   10/01/2020 Name: [name] MRN: [id number] DOB: [birth year]  Referred by: Olin Hauser, DO Reason for referral : Chronic Care Management (Patient Phone Call)   Stephen White is a 84 y.o. year old male who is a primary care patient of Olin Hauser, DO. The CCM team was consulted for assistance with chronic disease management and care coordination needs.    I reached out to [name] by phone today.   Review of patient status, including review of consultants reports, relevant laboratory and other test results, and collaboration with appropriate care team members and the patient's provider was performed as part of comprehensive patient evaluation and provision of chronic care management services.    SDOH (Social Determinants of Health) assessments performed: No See Care Plan activities for detailed interventions related to [facility])     Outpatient Encounter Medications as of 10/01/2020  Medication Sig Note  . famotidine (PEPCID) 10 MG tablet Take 1 tablet (10 mg total) by mouth daily.   . hydrochlorothiazide (HYDRODIURIL) 25 MG tablet Take 1 tablet (25 mg total) by mouth daily.   . insulin aspart (NOVOLOG) 100 UNIT/ML FlexPen Take NovoLog insulin 10-15 minutes BEFORE you eat.  Take NovoLog 28 units before breakfast Take NovoLog 15 units before lunch. Take NovoLog 32 units before supper.  Adjust for CBG <70 following direction from Endocrinologist   . Insulin Detemir (LEVEMIR) 100 UNIT/ML Pen Inject 24 Units into the skin at bedtime.    Marland Kitchen losartan (COZAAR) 100 MG tablet Take 1 tablet (100 mg total) by mouth daily.   . pravastatin (PRAVACHOL) 10 MG tablet TAKE 1 TABLET BY MOUTH EVERY DAY   . aspirin 81 MG tablet Take 81 mg by mouth daily.  05/05/2015: Received from: [facility]  . Biotin (BIOTIN 5000) 5 MG CAPS Take by mouth daily.   . Carboxymethylcellulose Sodium (EYE DROPS OP) Apply to eye.  (Patient not taking: Reported on 09/10/2020)   . Continuous Blood Gluc Receiver (FREESTYLE LIBRE 14 DAY READER) DEVI by Does not apply route.   . Emollient (GOLD BOND ULTIMATE) LOTN Apply topically at bedtime. Diabetic lotion   . gabapentin (NEURONTIN) 300 MG capsule Take 300 mg by mouth at bedtime as needed.   . loratadine (ALLERGY RELIEF) 10 MG tablet Take 10 mg by mouth daily as needed for allergies.   Marland Kitchen MULTIPLE VITAMIN PO Take by mouth daily.  05/05/2015: Received from: [facility]  . ONE TOUCH ULTRA TEST test strip 1 each by Other route as needed.  10/03/2015: Received from: External Pharmacy   No facility-administered encounter medications on file as of 10/01/2020.    Goals Addressed            This Visit's Progress   . PharmD - Medication Assistance       CARE PLAN ENTRY (see longitudinal plan of care for additional care plan information)  Current Barriers:  . Financial Barriers in complicated patient with multiple medical conditions including T2DM, CKD, GERD, HTN, HLD; patient has [employer] and reports copay for [place] is cost prohibitive at this time o Approved to receive Novolog and Levemir from [employer] patient assistance program   Pharmacist Clinical Goal(s):  Marland Kitchen Over the next 30 days, patient will work with PharmD and providers to relieve medication access concerns  Interventions: . Received coordination of care message from [place] on 10/8 advising that patient has been  APPROVED for [employer] patient assistance for [employer]. Medication should arrive to provider's office in the next 10-15 business days. . Perform chart review. Patient seen for follow up with Endocrinologist on 10/6. Provider advised patient to: "Scan your sugars before meals and at bedtime.  Bring your glucometer to each follow up visit with Dr. Gabriel Carina.  Adjust the Levemir to 24 units at bedtime.   Take NovoLog insulin 10-15  minutes BEFORE you eat.  Take NovoLog 28 units before breakfast Take NovoLog 15 units before lunch. Take NovoLog 32 units before supper.   If your sugar is less than 70 before the meal, then you can wait and take and take a half dose of NovoLog after the meal: 14 units after breakfast 7 units after lunch 16 units after supper " . Follow up with patient regarding blood sugar control and monitoring o Request patient locate AVS from recent visit with Endocrinologist and review patient instruction from provider together.  - Patient verbalizes understanding of direction from Endocrinologist - Advise patient to keep these instructions in accessible location near where administers his insulin injections for reference o Discuss rational for/importance of blood sugar control, parcitularly for prevention of complications o Patient reports skipped dose of Levemir last night because he felt like his blood sugar was good.  - Reports bedtime reading last night: 132 - Reports fasting reading this morning: 172 - Counsel on role of basal (long-acting) insulin and importance of taking consistently as directed o Counsel patient on importance of taking insulin regimen as directed by Endocrinologist and calling Endocrinology as needed for concerns related to his blood sugar o Counsel on impact of diet and exercise on blood sugar o Have counseled patient on s/s of low blood sugar and how to treat lows . Counsel on importance of blood pressure control and monitoring o Reports taking: - Losartan 100 mg daily - HCTZ 25 mg daily - Confirms no longer taking amlodipine o Unable to locate blood pressure log today o Encourage patient to monitor home BP, keep log and contact office for readings outside of established parameters . Follow up regarding past reports of dizziness. Denies recent episodes of dizziness . Will mail patient blood pressure log and educational handout as requested  Patient Self Care Activities:   . Patient will provide necessary portions of application   Please see past updates related to this goal by clicking on the "Past Updates" button in the selected goal         Plan  Telephone follow up appointment with care management team member scheduled for: 10/27 at 2:30 pm  Harlow Asa, PharmD, [place] [phone]

## 2020-10-01 NOTE — Patient Instructions (Signed)
Thank you allowing the Chronic Care Management Team to be a part of your care! It was a pleasure speaking with you today!     CCM (Chronic Care Management) Team    Noreene Larsson RN, MSN, CCM Nurse Care Coordinator  [phone]   Harlow Asa PharmD  Clinical Pharmacist  [phone]   Eula Fried LCSW Clinical Social Worker [phone]  Visit Information  Goals Addressed            This Visit's Progress   . PharmD - Medication Assistance       CARE PLAN ENTRY (see longitudinal plan of care for additional care plan information)  Current Barriers:  . Financial Barriers in complicated patient with multiple medical conditions including T2DM, CKD, GERD, HTN, HLD; patient has [employer] and reports copay for [place] is cost prohibitive at this time o Approved to receive Novolog and Levemir from [employer] patient assistance program   Pharmacist Clinical Goal(s):  Marland Kitchen Over the next 30 days, patient will work with PharmD and providers to relieve medication access concerns  Interventions: . Received coordination of care message from [place] Simcox on 10/8 advising that patient has been APPROVED for [employer] patient assistance for [employer]. Medication should arrive to provider's office in the next 10-15 business days. Perform chart review. Patient seen for follow up with Endocrinologist on 10/6. Marland Kitchen Follow up with patient regarding blood sugar control and monitoring o Request patient locate AVS from recent visit with Endocrinologist and review patient instruction from provider together.  - Patient verbalizes understanding of direction from Endocrinologist - Advise patient to keep these instructions in accessible location near where administers his insulin injections for reference o Discuss rational for/importance of blood sugar control, parcitularly for prevention of complications o Patient reports skipped dose of Levemir last night  because he felt like his blood sugar was good.  - Reports bedtime reading last night: 132 - Reports fasting reading this morning: 172 - Counsel on role of basal (long-acting) insulin and importance of taking consistently as directed o Counsel patient on importance of taking insulin regimen as directed by Endocrinologist and calling Endocrinology as needed for concerns related to his blood sugar o Counsel on impact of diet and exercise on blood sugar o Have counseled patient on s/s of low blood sugar and how to treat lows . Counsel on importance of blood pressure control and monitoring o Reports taking: - Losartan 100 mg daily - HCTZ 25 mg daily - Confirms no longer taking amlodipine o Unable to locate blood pressure log today o Encourage patient to monitor home BP, keep log and contact office for readings outside of established parameters . Follow up regarding past reports of dizziness. Denies recent episodes of dizziness . Will mail patient blood pressure log and educational handout as requested  Patient Self Care Activities:  . Patient will provide necessary portions of application   Please see past updates related to this goal by clicking on the "Past Updates" button in the selected goal         Patient verbalizes understanding of instructions provided today.   Telephone follow up appointment with care management team member scheduled for: 10/27 at 2:30 pm  Harlow Asa, PharmD, [place] [phone]

## 2020-10-15 ENCOUNTER — Ambulatory Visit: Payer: Self-pay | Admitting: Pharmacist

## 2020-10-15 DIAGNOSIS — I129 Hypertensive chronic kidney disease with stage 1 through stage 4 chronic kidney disease, or unspecified chronic kidney disease: Secondary | ICD-10-CM

## 2020-10-15 DIAGNOSIS — E119 Type 2 diabetes mellitus without complications: Secondary | ICD-10-CM

## 2020-10-15 NOTE — Patient Instructions (Signed)
Thank you allowing the Chronic Care Management Team to be a part of your care! It was a pleasure speaking with you today!     CCM (Chronic Care Management) Team    Noreene Larsson RN, MSN, CCM Nurse Care Coordinator  [phone]   Harlow Asa PharmD  Clinical Pharmacist  [phone]   Eula Fried LCSW Clinical Social Worker [phone]  Visit Information  Goals Addressed            This Visit's Progress    PharmD - Medication Assistance       CARE PLAN ENTRY (see longitudinal plan of care for additional care plan information)  Current Barriers:   Financial Barriers in complicated patient with multiple medical conditions including T2DM, CKD, GERD, HTN, HLD; patient has [employer] and reports copay for [place] is cost prohibitive at this time o Approved to receive Novolog and Levemir from [employer] patient assistance program 09/25/20-12/19/20  Pharmacist Clinical Goal(s):   Over the next 30 days, patient will work with PharmD and providers to relieve medication access concerns  Interventions:  Encourage patient to call clinic today: Reports having pain on his right side near his ribs since yesterday and that pain was severe enough that he was having trouble getting up. Reports started yesterday. Reports pain occurs when he moves around, particularly when bends over, but resolves when sitting still. Denies any recent fall or injury. Reports some improvement with taking Tylenol this morning. o Collaborate with PCP o Appointment scheduled for patient to see FNP Cyndia Skeeters in office tomorrow  Follow up regarding medication assistance. Mr. Molyneux reports that he picked up supply of both Novolog and Levemir as delievered to Endocrinology office from assistance program. o Will collaborate with [facility] CPhT to let her know  Reschedule appointment with patient to follow up regarding T2DM and hypertension monitoring and management as  requested  Patient Self Care Activities:   Patient will provide necessary portions of application   Please see past updates related to this goal by clicking on the "Past Updates" button in the selected goal         Patient verbalizes understanding of instructions provided today.   Telephone follow up appointment with care management team member scheduled for: 11/3 at 11:30 am  Harlow Asa, PharmD, [place] [phone]

## 2020-10-15 NOTE — Chronic Care Management (AMB) (Signed)
Chronic Care Management   Follow Up Note   10/15/2020 Name: Stephen White MRN: [id number] DOB: November 12, 1936  Referred by: Olin Hauser, DO Reason for referral : Chronic Care Management (Patient Phone Call)   Stephen White is a 84 y.o. year old male who is a primary care patient of Olin Hauser, DO. The CCM team was consulted for assistance with chronic disease management and care coordination needs.    I reached out to [name] by phone today.   Review of patient status, including review of consultants reports, relevant laboratory and other test results, and collaboration with appropriate care team members and the patient's provider was performed as part of comprehensive patient evaluation and provision of chronic care management services.    SDOH (Social Determinants of Health) assessments performed: No See Care Plan activities for detailed interventions related to [facility])     Outpatient Encounter Medications as of 10/15/2020  Medication Sig Note  . aspirin 81 MG tablet Take 81 mg by mouth daily.  05/05/2015: Received from: [facility]  . Biotin (BIOTIN 5000) 5 MG CAPS Take by mouth daily.   . Carboxymethylcellulose Sodium (EYE DROPS OP) Apply to eye. (Patient not taking: Reported on 09/10/2020)   . Continuous Blood Gluc Receiver (FREESTYLE LIBRE 14 DAY READER) DEVI by Does not apply route.   . Emollient (GOLD BOND ULTIMATE) LOTN Apply topically at bedtime. Diabetic lotion   . famotidine (PEPCID) 10 MG tablet Take 1 tablet (10 mg total) by mouth daily.   Marland Kitchen gabapentin (NEURONTIN) 300 MG capsule Take 300 mg by mouth at bedtime as needed.   . hydrochlorothiazide (HYDRODIURIL) 25 MG tablet Take 1 tablet (25 mg total) by mouth daily.   . insulin aspart (NOVOLOG) 100 UNIT/ML FlexPen Take NovoLog insulin 10-15 minutes BEFORE you eat.  Take NovoLog 28 units before breakfast Take NovoLog 15 units before lunch. Take NovoLog 32 units before  supper.  Adjust for CBG <70 following direction from Endocrinologist   . Insulin Detemir (LEVEMIR) 100 UNIT/ML Pen Inject 24 Units into the skin at bedtime.    Marland Kitchen loratadine (ALLERGY RELIEF) 10 MG tablet Take 10 mg by mouth daily as needed for allergies.   Marland Kitchen losartan (COZAAR) 100 MG tablet Take 1 tablet (100 mg total) by mouth daily.   . MULTIPLE VITAMIN PO Take by mouth daily.  05/05/2015: Received from: [facility]  . ONE TOUCH ULTRA TEST test strip 1 each by Other route as needed.  10/03/2015: Received from: External Pharmacy  . pravastatin (PRAVACHOL) 10 MG tablet TAKE 1 TABLET BY MOUTH EVERY DAY    No facility-administered encounter medications on file as of 10/15/2020.    Goals Addressed            This Visit's Progress   . PharmD - Medication Assistance       CARE PLAN ENTRY (see longitudinal plan of care for additional care plan information)  Current Barriers:  . Financial Barriers in complicated patient with multiple medical conditions including T2DM, CKD, GERD, HTN, HLD; patient has [employer] and reports copay for [place] is cost prohibitive at this time o Approved to receive Novolog and Levemir from [employer] patient assistance program 09/25/20-12/19/20  Pharmacist Clinical Goal(s):  Marland Kitchen Over the next 30 days, patient will work with PharmD and providers to relieve medication access concerns  Interventions: . Encourage patient to call clinic today: Reports having pain on his right side near his ribs since  yesterday and that pain was severe enough that he was having trouble getting up. Reports started yesterday. Reports pain occurs when he moves around, particularly when bends over, but resolves when sitting still. Denies any recent fall or injury. Reports some improvement with taking Tylenol this morning. Salley Scarlet with PCP o Appointment scheduled for patient to see FNP Cyndia Skeeters in office tomorrow . Follow up regarding  medication assistance. Stephen White reports that he picked up supply of both Novolog and Levemir as delievered to Endocrinology office from assistance program. o Will collaborate with [facility] to let her know . Reschedule appointment with patient to follow up regarding T2DM and hypertension monitoring and management as requested  Patient Self Care Activities:  . Patient will provide necessary portions of application   Please see past updates related to this goal by clicking on the "Past Updates" button in the selected goal         Plan  Telephone follow up appointment with care management team member scheduled for: 11/3 at 11:30 am  Harlow Asa, PharmD, [place] [phone]

## 2020-10-16 ENCOUNTER — Ambulatory Visit (INDEPENDENT_AMBULATORY_CARE_PROVIDER_SITE_OTHER): Payer: Medicare HMO | Admitting: Family Medicine

## 2020-10-16 ENCOUNTER — Other Ambulatory Visit: Payer: Self-pay

## 2020-10-16 ENCOUNTER — Encounter: Payer: Self-pay | Admitting: Family Medicine

## 2020-10-16 VITALS — BP 156/57 | HR 69 | Temp 98.2°F | Resp 18 | Ht 67.0 in | Wt 193.6 lb

## 2020-10-16 DIAGNOSIS — Z23 Encounter for immunization: Secondary | ICD-10-CM | POA: Diagnosis not present

## 2020-10-16 NOTE — Progress Notes (Signed)
Nurse visit for influenza immunization

## 2020-10-18 ENCOUNTER — Other Ambulatory Visit: Payer: Self-pay | Admitting: Family Medicine

## 2020-10-18 NOTE — Telephone Encounter (Signed)
Requested Prescriptions  Pending Prescriptions Disp Refills   pravastatin (PRAVACHOL) 10 MG tablet [Pharmacy Med Name: PRAVASTATIN 10MG  TABLETS] 90 tablet 0    Sig: TAKE 1 TABLET BY MOUTH EVERY DAY     Cardiovascular:  Antilipid - Statins Passed - 10/18/2020  2:45 PM      Passed - Total Cholesterol in normal range and within 360 days    Cholesterol, Total  Date Value Ref Range Status  11/07/2018 162 100 - 199 mg/dL Final   Cholesterol  Date Value Ref Range Status  08/01/2020 152 <200 mg/dL Final         Passed - LDL in normal range and within 360 days    LDL Cholesterol (Calc)  Date Value Ref Range Status  08/01/2020 82 mg/dL (calc) Final    Comment:    Reference range: <100 . Desirable range <100 mg/dL for primary prevention;   <70 mg/dL for patients with CHD or diabetic patients  with > or = 2 CHD risk factors. Marland Kitchen LDL-C is now calculated using the Martin-Hopkins  calculation, which is a validated novel method providing  better accuracy than the Friedewald equation in the  estimation of LDL-C.  Cresenciano Genre et al. Annamaria Helling. 9390;300(92): 2061-2068  (http://education.[employer].com/faq/FAQ164)          Passed - HDL in normal range and within 360 days    HDL  Date Value Ref Range Status  08/01/2020 56 > OR = 40 mg/dL Final  11/07/2018 54 >39 mg/dL Final         Passed - Triglycerides in normal range and within 360 days    Triglycerides  Date Value Ref Range Status  08/01/2020 68 <150 mg/dL Final         Passed - Patient is not pregnant      Passed - Valid encounter within last 12 months    Recent Outpatient Visits          2 days ago Flu vaccine need   [facility], Lupita Raider, FNP   1 month ago Benign hypertension with CKD (chronic kidney disease) stage IV [facility])   [place], DO   2 months ago Annual physical exam   [place], DO   5 months ago Essential  (primary) hypertension   [place], DO   6 months ago Episodic lightheadedness   [place], DO      Future Appointments            In 3 weeks Ralene Bathe, MD [place]   In 1 month [facility]

## 2020-10-22 ENCOUNTER — Ambulatory Visit: Payer: Self-pay | Admitting: Pharmacist

## 2020-10-22 DIAGNOSIS — E119 Type 2 diabetes mellitus without complications: Secondary | ICD-10-CM

## 2020-10-22 DIAGNOSIS — Z794 Long term (current) use of insulin: Secondary | ICD-10-CM

## 2020-10-22 NOTE — Patient Instructions (Signed)
Thank you allowing the Chronic Care Management Team to be a part of your care! It was a pleasure speaking with you today!     CCM (Chronic Care Management) Team    Noreene Larsson RN, MSN, CCM Nurse Care Coordinator  [phone]   Harlow Asa PharmD  Clinical Pharmacist  [phone]   Eula Fried LCSW Clinical Social Worker [phone]  Visit Information  Goals Addressed            This Visit's Progress   . PharmD - Medication Assistance       CARE PLAN ENTRY (see longitudinal plan of care for additional care plan information)  Current Barriers:  . Financial Barriers in complicated patient with multiple medical conditions including T2DM, CKD, GERD, HTN, HLD; patient has [employer] and reports copay for [place] is cost prohibitive at this time o Approved to receive Novolog and Levemir from [employer] patient assistance program 09/25/20-12/19/20  Pharmacist Clinical Goal(s):  Marland Kitchen Over the next 30 days, patient will work with PharmD and providers to relieve medication access concerns  Interventions: . Follow up with patient regarding pain that he reported having on his right side during phone call on 10/27.  o Patient reports pain resolved overnight on 10/27  Follow up with patient regarding blood sugar control and monitoring  Note patient using Freestyle Libre 14 day continuous blood glucose monitor o Patient reports taking Levemir and Novolog according to instructions from recent visit with Endocrinologist on 10/6, but admits to not taking some doses due to concern for low blood sugars o Reports last night had a supper (veggie & beef soup with a cheese sandwich) ~6pm.   Reports pre-meal blood sugar: 95  Took Novolog- 32 units with supper as directed o Reports the following CBGs after supper:  9:18 pm: 50  9:36 pm: 43 Reports shaking and impaired vision with these readings. States ate chocolate covered graham cracker as well as cereal  with milk to bring sugar up . 9:57 pm: 85 . 11:59 pm: 220 . 12:29 am: 199 o Reports skipped Levemir dose at bedtime due to fear of further lows o This morning CBG before breakfast: 217  Reports ate: 2 eggs, 1 piece of toast and 1 piece of sausage for breakfast  Reports took Novolog 28 units as directed ~30 minutes prior to breakfast  Again counsel patient that Novolog is a rapid-acting insulin and should be taken within 10 minutes of meal. . Counsel patient on importance of calling Endocrinology as needed for concerns related to his blood sugar o Encourage patient to call today regarding overnight blood sugar readings and concerns. Patient requests my assistance with reaching out to Endocrinologist office . Counsel on role of Levemir as basal (long-acting) insulin and importance of taking consistently as directed . Counsel patient and wife on s/s of low blood sugar and how to treat lows . Encourage patient and wife to keep log of dates, times and number of units of Levemir and Novolog given to share with provider with blood sugar readings from monitor . 12pm: Place coordination of care call to [facility] Endocrinology. Leave message with Anderson Malta in office requesting call back. . 4 pm: Place second call to [facility] Endocrinology asking clinic reach out directly to patient regarding blood sugars/recommendations . From review of chart note Janett Billow, CMA with clinic outreached to patient/wife. Patient to bring in [place] monitor to office tomorrow for download and patient scheduled for appointment with Endocrinologist on  11/8.  Patient Self Care Activities:  . Patient will provide necessary portions of application   Please see past updates related to this goal by clicking on the "Past Updates" button in the selected goal         Patient verbalizes understanding of instructions provided today.   The care management team will reach out to the patient again over the next  10 days.   Harlow Asa, PharmD, [place] [employer] [phone]

## 2020-10-22 NOTE — Chronic Care Management (AMB) (Signed)
Chronic Care Management   Follow Up Note   10/22/2020 Name: Stephen White MRN: [id number] DOB: May 31, 1936  Referred by: Olin Hauser, DO Reason for referral : Chronic Care Management (Patient Phone Call) and Care Coordination [facility])   Stephen White is a 84 y.o. year old male who is a primary care patient of Olin Hauser, DO. The CCM team was consulted for assistance with chronic disease management and care coordination needs.    I reached out to [name] by phone today.   Place coordination of care calls to [facility] Endocrinology. Leave message with Anderson Malta requesting call back.  Review of patient status, including review of consultants reports, relevant laboratory and other test results, and collaboration with appropriate care team members and the patient's provider was performed as part of comprehensive patient evaluation and provision of chronic care management services.    SDOH (Social Determinants of Health) assessments performed: No See Care Plan activities for detailed interventions related to [facility])     Outpatient Encounter Medications as of 10/22/2020  Medication Sig Note  . aspirin 81 MG tablet Take 81 mg by mouth daily.  05/05/2015: Received from: [facility]  . Biotin (BIOTIN 5000) 5 MG CAPS Take by mouth daily. (Patient not taking: Reported on 10/16/2020)   . Carboxymethylcellulose Sodium (EYE DROPS OP) Apply to eye. (Patient not taking: Reported on 10/16/2020)   . Continuous Blood Gluc Receiver (FREESTYLE LIBRE 14 DAY READER) DEVI by Does not apply route.   . Emollient (GOLD BOND ULTIMATE) LOTN Apply topically at bedtime. Diabetic lotion   . famotidine (PEPCID) 10 MG tablet Take 1 tablet (10 mg total) by mouth daily.   Marland Kitchen gabapentin (NEURONTIN) 300 MG capsule Take 300 mg by mouth at bedtime as needed.   . hydrochlorothiazide (HYDRODIURIL) 25 MG tablet Take 1 tablet (25 mg total) by mouth  daily.   . insulin aspart (NOVOLOG) 100 UNIT/ML FlexPen Take NovoLog insulin 10-15 minutes BEFORE you eat.  Take NovoLog 28 units before breakfast Take NovoLog 15 units before lunch. Take NovoLog 32 units before supper.  Adjust for CBG <70 following direction from Endocrinologist   . Insulin Detemir (LEVEMIR) 100 UNIT/ML Pen Inject 24 Units into the skin at bedtime.    Marland Kitchen loratadine (ALLERGY RELIEF) 10 MG tablet Take 10 mg by mouth daily as needed for allergies.   Marland Kitchen losartan (COZAAR) 100 MG tablet Take 1 tablet (100 mg total) by mouth daily.   . MULTIPLE VITAMIN PO Take by mouth daily.  05/05/2015: Received from: [facility]  . ONE TOUCH ULTRA TEST test strip 1 each by Other route as needed.  10/03/2015: Received from: External Pharmacy  . pravastatin (PRAVACHOL) 10 MG tablet TAKE 1 TABLET BY MOUTH EVERY DAY    No facility-administered encounter medications on file as of 10/22/2020.    Goals Addressed            This Visit's Progress   . PharmD - Medication Assistance       CARE PLAN ENTRY (see longitudinal plan of care for additional care plan information)  Current Barriers:  . Financial Barriers in complicated patient with multiple medical conditions including T2DM, CKD, GERD, HTN, HLD; patient has [employer] and reports copay for [name] is cost prohibitive at this time o Approved to receive Novolog and Levemir from [employer] patient assistance program 09/25/20-12/19/20  Pharmacist Clinical Goal(s):  Marland Kitchen Over the next 30 days, patient will work  with PharmD and providers to relieve medication access concerns  Interventions: . Follow up with patient regarding pain that he reported having on his right side during phone call on 10/27.  o Patient reports pain resolved overnight on 10/27  Follow up with patient regarding blood sugar control and monitoring  Note patient using Freestyle Libre 14 day continuous blood glucose monitor o Patient  reports taking Levemir and Novolog according to instructions from recent visit with Endocrinologist on 10/6, but admits to not taking some doses due to concern for low blood sugars o Reports last night had a supper (veggie & beef soup with a cheese sandwich) ~6pm.   Reports pre-meal blood sugar: 95  Took Novolog- 32 units with supper as directed o Reports the following CBGs after supper:  9:18 pm: 50  9:36 pm: 43 Reports shaking and impaired vision with these readings. States ate chocolate covered graham cracker as well as cereal with milk to bring sugar up . 9:57 pm: 85 . 11:59 pm: 220 . 12:29 am: 199 o Reports skipped Levemir dose at bedtime due to fear of further lows o This morning CBG before breakfast: 217  Reports ate: 2 eggs, 1 piece of toast and 1 piece of sausage for breakfast  Reports took Novolog 28 units as directed ~30 minutes prior to breakfast  Again counsel patient that Novolog is a rapid-acting insulin and should be taken within 10 minutes of meal. . Counsel patient on importance of calling Endocrinology as needed for concerns related to his blood sugar o Encourage patient to call today regarding overnight blood sugar readings and concerns. Patient requests my assistance with reaching out to Endocrinologist office . Counsel on role of Levemir as basal (long-acting) insulin and importance of taking consistently as directed . Counsel patient and wife on s/s of low blood sugar and how to treat lows . Encourage patient and wife to keep log of dates, times and number of units of Levemir and Novolog given to share with provider with blood sugar readings from monitor . 12pm: Place coordination of care call to [facility] Endocrinology. Leave message with Anderson Malta in office requesting call back. . 4 pm: Place second call to [facility] Endocrinology asking clinic reach out directly to patient regarding blood sugars/recommendations . From review of chart note Janett Billow,  CMA with clinic outreached to patient/wife. Patient to bring in [place] monitor to office tomorrow for download and patient scheduled for appointment with Endocrinologist on 11/8.  Patient Self Care Activities:  . Patient will provide necessary portions of application   Please see past updates related to this goal by clicking on the "Past Updates" button in the selected goal         Plan  The care management team will reach out to the patient again over the next 10 days.   Harlow Asa, PharmD, [place] [employer] [phone]

## 2020-10-29 ENCOUNTER — Ambulatory Visit: Payer: Self-pay | Admitting: Pharmacist

## 2020-10-29 DIAGNOSIS — I129 Hypertensive chronic kidney disease with stage 1 through stage 4 chronic kidney disease, or unspecified chronic kidney disease: Secondary | ICD-10-CM

## 2020-10-29 DIAGNOSIS — E119 Type 2 diabetes mellitus without complications: Secondary | ICD-10-CM

## 2020-10-29 DIAGNOSIS — Z794 Long term (current) use of insulin: Secondary | ICD-10-CM

## 2020-10-29 DIAGNOSIS — N184 Chronic kidney disease, stage 4 (severe): Secondary | ICD-10-CM

## 2020-10-29 NOTE — Patient Instructions (Signed)
Thank you allowing the Chronic Care Management Team to be a part of your care! It was a pleasure speaking with you today!     CCM (Chronic Care Management) Team    Noreene Larsson RN, MSN, CCM Nurse Care Coordinator  [phone]   Harlow Asa PharmD  Clinical Pharmacist  [phone]   Eula Fried LCSW Clinical Social Worker [phone]  Visit Information  Goals Addressed            This Visit's Progress    PharmD - Medication Assistance       CARE PLAN ENTRY (see longitudinal plan of care for additional care plan information)  Current Barriers:   Financial Barriers in complicated patient with multiple medical conditions including T2DM, CKD, GERD, HTN, HLD; patient has [employer] and reports copay for [place] is cost prohibitive at this time o Approved to receive Novolog and Levemir from [employer] patient assistance program 09/25/20-12/19/20  Pharmacist Clinical Goal(s):   Over the next 30 days, patient will work with PharmD and providers to relieve medication access concerns  Interventions:  Perform chart review. Note patient seen by Endocrinologist on 11/8. Provider advised patient to:  Adjust insulin doses:  Levemir: 15 units nightly  NovoLog:  32 units before breakfast 15 units before lunch  32 units before supper  If blood glucose <70 before meal, take 1/2 scheduled dose   Provider sent order to change patient to Freestyle Libre 2 Continuous Glucose Monitor (CGM), as he would benefit from alarm for hypoglycemia   Follow up with patient today regarding blood sugar control and monitoring  Reports blood sugar readings from today:  Currently: 189 from CGM  Reports earlier in the day had blood sugar of "300-something" ~1 hour after breakfast (buscuit with jelly, eggs and pork chop)  Patient reports took Novolog 32 units prior to breakfast as directed  Reports blood sugar prior to supper last night: 129  But reports  took 28 units, rather than 32 units last night  Patient locates AVS from 11/8 visit with Endocrinologist and verbalizes understanding of directions from provider  Encourage patient to keep latest AVS instruction from Endocrinologist of Levemir and Novolog dosing where he administers his insulin to use closely as reference  Encourage patient and wife to keep log of dates, times and number of units of Levemir and Novolog given to share with provider with blood sugar readings from monitor  Counsel patient on importance of calling Endocrinology as needed for concerns related to his blood sugar  Have counseled patient and wife on s/s of low blood sugar and how to treat lows  Have discussed importance of having regular well-balanced meals and control of carbohydrate portions sizes  Counsel on importance of blood pressure control and monitoring o Reports taking:  Losartan 100 mg daily  HCTZ 25 mg daily o Reports last checked home BP on 11/6: 142/62, HR 68 o Note BP from Endocrinology office visit on 11/8: 124/60, HR 70 Advise patient to continue home BP monitoring, keep log and contact office for readings outside of established parameters  Counsel on importance of medication adherence o Note patient uses weekly pillbox as adherence aid o Denies having picked up latest Rx for famotidine from pharmacy. Advise patient to contact pharmacy for fill/pick of famotidine 10 mg daily Rx o Counsel patient on importance of storing medications in original pill bottle, rather than pouring from one pill bottle to another.  Will collaborate with [place] for  assistance to patient with re-enrolling for [employer] patient assistance for Levemir and Novolog for 2022 calendar year  Patient Self Care Activities:   Patient will provide necessary portions of application   Patient to attend medical appointments as scheduled o Next appointment with PCP on 11/29 o Next appointment with  Endocrinologist on 1/19 (lab appointment on 1/12)  Please see past updates related to this goal by clicking on the "Past Updates" button in the selected goal         Patient verbalizes understanding of instructions provided today.   Telephone follow up appointment with care management team member scheduled for: 12/8 at 1:15 pm  Harlow Asa, PharmD, [place] [phone]

## 2020-10-29 NOTE — Chronic Care Management (AMB) (Signed)
Chronic Care Management   Follow Up Note   10/29/2020 Name: Stephen White MRN: [id number] DOB: 1936/04/07  Referred by: Olin Hauser, DO Reason for referral : Chronic Care Management (Patient Phone Call)   Stephen White is a 84 y.o. year old male who is a primary care patient of Olin Hauser, DO. The CCM team was consulted for assistance with chronic disease management and care coordination needs.    I reached out to [name] by phone today.   Review of patient status, including review of consultants reports, relevant laboratory and other test results, and collaboration with appropriate care team members and the patient's provider was performed as part of comprehensive patient evaluation and provision of chronic care management services.    SDOH (Social Determinants of Health) assessments performed: No See Care Plan activities for detailed interventions related to [facility])     Outpatient Encounter Medications as of 10/29/2020  Medication Sig Note  . aspirin 81 MG tablet Take 81 mg by mouth daily.  05/05/2015: Received from: [facility]  . famotidine (PEPCID) 10 MG tablet Take 1 tablet (10 mg total) by mouth daily.   Marland Kitchen gabapentin (NEURONTIN) 300 MG capsule Take 300 mg by mouth at bedtime as needed.   . hydrochlorothiazide (HYDRODIURIL) 25 MG tablet Take 1 tablet (25 mg total) by mouth daily.   . insulin aspart (NOVOLOG) 100 UNIT/ML FlexPen Take NovoLog insulin 10-15 minutes BEFORE you eat.  Take NovoLog 32 units before breakfast Take NovoLog 15 units before lunch Take NovoLog 32 units before supper.  Adjust for CBG <70 following direction from Endocrinologist   . insulin detemir (LEVEMIR) 100 UNIT/ML FlexPen Inject into the skin. Inject 15 Units subcutaneously nightly   . loratadine (ALLERGY RELIEF) 10 MG tablet Take 10 mg by mouth every other day as needed for allergies.    Marland Kitchen losartan (COZAAR) 100 MG tablet Take 1 tablet (100 mg total)  by mouth daily.   . MULTIPLE VITAMIN PO Take by mouth daily.  05/05/2015: Received from: [facility]  . pravastatin (PRAVACHOL) 10 MG tablet TAKE 1 TABLET BY MOUTH EVERY DAY   . Biotin (BIOTIN 5000) 5 MG CAPS Take by mouth daily. (Patient not taking: Reported on 10/16/2020)   . Carboxymethylcellulose Sodium (EYE DROPS OP) Apply to eye. (Patient not taking: Reported on 10/16/2020)   . Continuous Blood Gluc Receiver (FREESTYLE LIBRE 14 DAY READER) DEVI by Does not apply route.   . Emollient (GOLD BOND ULTIMATE) LOTN Apply topically at bedtime. Diabetic lotion   . ONE TOUCH ULTRA TEST test strip 1 each by Other route as needed.  10/03/2015: Received from: External Pharmacy  . [DISCONTINUED] Insulin Detemir (LEVEMIR) 100 UNIT/ML Pen Inject 24 Units into the skin at bedtime.     No facility-administered encounter medications on file as of 10/29/2020.    Goals Addressed            This Visit's Progress   . PharmD - Medication Assistance       CARE PLAN ENTRY (see longitudinal plan of care for additional care plan information)  Current Barriers:  . Financial Barriers in complicated patient with multiple medical conditions including T2DM, CKD, GERD, HTN, HLD; patient has [employer] and reports copay for [place] is cost prohibitive at this time o Approved to receive Novolog and Levemir from [employer] patient assistance program 09/25/20-12/19/20  Pharmacist Clinical Goal(s):  Marland Kitchen Over the next 30 days, patient will work with  PharmD and providers to relieve medication access concerns  Interventions:  Perform chart review. Note patient seen by Endocrinologist on 11/8. Provider advised patient to:  Adjust insulin doses:  Levemir: 15 units nightly  NovoLog:  32 units before breakfast 15 units before lunch  32 units before supper  If blood glucose <70 before meal, take 1/2 scheduled dose   Provider sent order to change patient to Freestyle Libre  2 Continuous Glucose Monitor (CGM), as he would benefit from alarm for hypoglycemia   Follow up with patient today regarding blood sugar control and monitoring  Reports blood sugar readings from today:  Currently: 189 from CGM  Reports earlier in the day had blood sugar of "300-something" ~1 hour after breakfast (buscuit with jelly, eggs and pork chop)  Patient reports took Novolog 32 units prior to breakfast as directed  Reports blood sugar prior to supper last night: 129  But reports took 28 units, rather than 32 units last night  Patient locates AVS from 11/8 visit with Endocrinologist and verbalizes understanding of directions from provider  Encourage patient to keep latest AVS instruction from Endocrinologist of Levemir and Novolog dosing where he administers his insulin to use closely as reference  Encourage patient and wife to keep log of dates, times and number of units of Levemir and Novolog given to share with provider with blood sugar readings from monitor  Counsel patient on importance of calling Endocrinology as needed for concerns related to his blood sugar  Have counseled patient and wife on s/s of low blood sugar and how to treat lows  Have discussed importance of having regular well-balanced meals and control of carbohydrate portions sizes . Counsel on importance of blood pressure control and monitoring o Reports taking:  Losartan 100 mg daily  HCTZ 25 mg daily o Reports last checked home BP on 11/6: 142/62, HR 68 o Note BP from Endocrinology office visit on 11/8: 124/60, HR 70 Advise patient to continue home BP monitoring, keep log and contact office for readings outside of established parameters . Counsel on importance of medication adherence o Note patient uses weekly pillbox as adherence aid o Denies having picked up latest Rx for famotidine from pharmacy. Advise patient to contact pharmacy for fill/pick of famotidine 10 mg daily Rx o Counsel patient on  importance of storing medications in original pill bottle, rather than pouring from one pill bottle to another. . Will collaborate with [facility] CPhT Susy Frizzle for assistance to patient with re-enrolling for [employer] patient assistance for Levemir and Novolog for 2022 calendar year  Patient Self Care Activities:  . Patient will provide necessary portions of application  . Patient to attend medical appointments as scheduled o Next appointment with PCP on 11/29 o Next appointment with Endocrinologist on 1/19 (lab appointment on 1/12)  Please see past updates related to this goal by clicking on the "Past Updates" button in the selected goal         Plan  Telephone follow up appointment with care management team member scheduled for: 12/8 at 1:15 pm  Harlow Asa, PharmD, [place] [phone]

## 2020-11-11 ENCOUNTER — Ambulatory Visit: Payer: Medicare HMO | Admitting: Dermatology

## 2020-11-17 ENCOUNTER — Ambulatory Visit (INDEPENDENT_AMBULATORY_CARE_PROVIDER_SITE_OTHER): Payer: Medicare HMO | Admitting: Family Medicine

## 2020-11-17 ENCOUNTER — Other Ambulatory Visit: Payer: Self-pay

## 2020-11-17 ENCOUNTER — Encounter: Payer: Self-pay | Admitting: Family Medicine

## 2020-11-17 VITALS — BP 135/53 | HR 60 | Temp 96.9°F | Resp 16 | Ht 67.0 in | Wt 194.0 lb

## 2020-11-17 DIAGNOSIS — R2689 Other abnormalities of gait and mobility: Secondary | ICD-10-CM | POA: Diagnosis not present

## 2020-11-17 DIAGNOSIS — J321 Chronic frontal sinusitis: Secondary | ICD-10-CM | POA: Insufficient documentation

## 2020-11-17 DIAGNOSIS — Z794 Long term (current) use of insulin: Secondary | ICD-10-CM | POA: Diagnosis not present

## 2020-11-17 DIAGNOSIS — E119 Type 2 diabetes mellitus without complications: Secondary | ICD-10-CM

## 2020-11-17 MED ORDER — FLUTICASONE PROPIONATE 50 MCG/ACT NA SUSP: 2.0000 | Freq: Every day | NASAL | 3 refills | Status: DC

## 2020-11-17 NOTE — Progress Notes (Signed)
Subjective:    Patient ID: Stephen White, male    DOB: 10/26/1936, 84 y.o.   MRN: [id number]  Stephen White is a 84 y.o. male presenting on 11/17/2020 for Dizziness (3 month follow up btw chronic sinusitis as per patient )   HPI   T2DM Followed by [name] for Diabetes Has CGM, they are going to update CGM with new order soon. Last A1c was 8.4 (09/17/20)  History of Dizziness He admits episodic dizziness during day and night at times, but not always now, overall has much improved off prior medications including benadryl/diphenhydramine. Improved He has reduced overall since last visit.  Chronic Sinusitis Describes persistent sinus and allergy problem, uses nasal saline and OTC oral anti histamine. Had been on nasal spray years ago, interested to restart medicine.  Right Ring Finger, Trigger Reports often wakes up with stiff or fixed finger locked, he often will get it moving and improve it.   Depression screen [facility] 2/9 11/17/2020 08/15/2020 08/12/2020  Decreased Interest 0 0 0  Down, Depressed, Hopeless 0 0 0  PHQ - 2 Score 0 0 0    Social History   Tobacco Use  . Smoking status: Former Smoker    Packs/day: 0.30    Years: 25.00    Pack years: 7.50    Types: Cigarettes    Quit date: 12/19/1978    Years since quitting: 41.9  . Smokeless tobacco: Former Network engineer  . Vaping Use: Never used  Substance Use Topics  . Alcohol use: Yes    Alcohol/week: 0.0 standard drinks  . Drug use: No    Review of Systems Per HPI unless specifically indicated above     Objective:    BP (!) 135/53   Pulse 60   Temp (!) 96.9 F (36.1 C) (Temporal)   Resp 16   Ht 5\' 7"  (1.702 m)   Wt 194 lb (88 kg)   SpO2 98%   BMI 30.38 kg/m   Wt Readings from Last 3 Encounters:  11/17/20 194 lb (88 kg)  10/16/20 193 lb 9.6 oz (87.8 kg)  09/03/20 196 lb (88.9 kg)    Physical Exam Vitals and nursing note reviewed.  Constitutional:      General: He is not in acute distress.     Appearance: He is well-developed. He is not diaphoretic.     Comments: Well-appearing, comfortable, cooperative  HENT:     Head: Normocephalic and atraumatic.  Eyes:     General:        Right eye: No discharge.        Left eye: No discharge.     Conjunctiva/sclera: Conjunctivae normal.  Cardiovascular:     Rate and Rhythm: Normal rate.  Pulmonary:     Effort: Pulmonary effort is normal.  Skin:    General: Skin is warm and dry.     Findings: No erythema or rash.  Neurological:     Mental Status: He is alert and oriented to person, place, and time.  Psychiatric:        Behavior: Behavior normal.     Comments: Well groomed, good eye contact, normal speech and thoughts    Results for orders placed or performed in visit on 11/17/20  Hemoglobin A1c  Result Value Ref Range   Hemoglobin A1C 8.4       Assessment & Plan:   Problem List Items Addressed This Visit    Chronic frontal sinusitis   Relevant Medications  fluticasone (FLONASE) 50 MCG/ACT nasal spray    Other Visit Diagnoses    Type 2 diabetes mellitus treated with insulin (HCC)    -  Primary   Balance disorder          #T2DM Controlled last A1c 8.4 Followed by [name], [facility] Endocrinology Has CGM, reviewed readings today, he will get updated orders for Endocrine  #Chronic Sinusitis Secondary allergies, allergic rhinosinusitis No sign of acute bacterial infection Start nasal steroid Flonase 2 sprays in each nostril daily for 4-6 weeks, may repeat course seasonally or as needed If need additional therapy would recommend Azelastine.  #Dizziness/Balance Significantly improved. No longer has medication induced dizziness Has some chronic residual balance issues with some generalized muscle weakness.  Meds ordered this encounter  Medications  . fluticasone (FLONASE) 50 MCG/ACT nasal spray    Sig: Place 2 sprays into both nostrils daily. Use for 4-6 weeks then stop and use seasonally or as needed.    Dispense:  16 g     Refill:  3      Follow up plan: Return in about 3 months (around 02/16/2021) for 3 month sinusitis, HTN.   Nobie Putnam, [facility] 11/17/2020, 1:58 PM

## 2020-11-17 NOTE — Patient Instructions (Addendum)
Thank you for coming to the office today.  For sinuses  Start nasal steroid Flonase 2 sprays in each nostril daily for 4-6 weeks, may repeat course seasonally or as needed   If not strong enough after 4-6 weeks we can add a 2nd nasal spray Azelastine.  Keep taking allergy medicine if need.  May keep nasal spray longer if it is working well.  ---------------------------  For Constipation (less frequent bowel movement that can be hard dry or involve straining).  Recommend trying OTC Miralax 17g = 1 capful in large glass water once daily for now, try several days to see if working, goal is soft stool or BM 1-2 times daily, if too loose then reduce dose or try every other day. If not effective may need to increase it to 2 doses at once in AM or may do 1 in morning and 1 in afternoon/evening  - This medicine is very safe and can be used often without any problem and will not make you dehydrated. It is good for use on AS NEEDED BASIS or even MAINTENANCE therapy for longer term for several days to weeks at a time to help regulate bowel movements  Other more natural remedies or preventative treatment: - Increase hydration with water - Increase fiber in diet (high fiber foods = vegetables, leafy greens, oats/grains) - May take OTC Fiber supplement (metamucil powder or pill/gummy) - May try OTC Probiotic   Please schedule a Follow-up Appointment to: Return in about 3 months (around 02/16/2021) for 3 month sinusitis, HTN.  If you have any other questions or concerns, please feel free to call the office or send a message through [place]. You may also schedule an earlier appointment if necessary.  Additionally, you may be receiving a survey about your experience at our office within a few days to 1 week by e-mail or mail. We value your feedback.  Nobie Putnam, DO [place]

## 2020-11-26 ENCOUNTER — Ambulatory Visit: Payer: Medicare HMO | Admitting: Pharmacist

## 2020-11-26 DIAGNOSIS — E119 Type 2 diabetes mellitus without complications: Secondary | ICD-10-CM

## 2020-11-26 DIAGNOSIS — Z794 Long term (current) use of insulin: Secondary | ICD-10-CM

## 2020-11-26 DIAGNOSIS — I129 Hypertensive chronic kidney disease with stage 1 through stage 4 chronic kidney disease, or unspecified chronic kidney disease: Secondary | ICD-10-CM

## 2020-11-26 NOTE — Chronic Care Management (AMB) (Signed)
Chronic Care Management   Follow Up Note   11/26/2020 Name: Stephen White MRN: [id number] DOB: Jul 25, 1936  Referred by: Olin Hauser, DO Reason for referral : Chronic Care Management (Patient Phone Call)   Stephen White is a 84 y.o. year old male who is a primary care patient of Olin Hauser, DO. The CCM team was consulted for assistance with chronic disease management and care coordination needs.    I reached out to [name] by phone today.   Review of patient status, including review of consultants reports, relevant laboratory and other test results, and collaboration with appropriate care team members and the patient's provider was performed as part of comprehensive patient evaluation and provision of chronic care management services.    SDOH (Social Determinants of Health) assessments performed: No See Care Plan activities for detailed interventions related to [facility])     Outpatient Encounter Medications as of 11/26/2020  Medication Sig Note  . fluticasone (FLONASE) 50 MCG/ACT nasal spray Place 2 sprays into both nostrils daily. Use for 4-6 weeks then stop and use seasonally or as needed.   . insulin aspart (NOVOLOG) 100 UNIT/ML FlexPen Take NovoLog insulin 10-15 minutes BEFORE you eat.  Take NovoLog 32 units before breakfast Take NovoLog 15 units before lunch Take NovoLog 32 units before supper.  Adjust for CBG <70 following direction from Endocrinologist   . insulin detemir (LEVEMIR) 100 UNIT/ML FlexPen Inject into the skin. Inject 15 Units subcutaneously nightly   . loratadine (ALLERGY RELIEF) 10 MG tablet Take 10 mg by mouth every other day as needed for allergies.    Marland Kitchen aspirin 81 MG tablet Take 81 mg by mouth daily.  05/05/2015: Received from: [facility]  . Biotin (BIOTIN 5000) 5 MG CAPS Take by mouth daily.    . Carboxymethylcellulose Sodium (EYE DROPS OP) Apply to eye.    . Continuous Blood Gluc Receiver (FREESTYLE LIBRE 14  DAY READER) DEVI by Does not apply route.   . Emollient (GOLD BOND ULTIMATE) LOTN Apply topically at bedtime. Diabetic lotion   . famotidine (PEPCID) 10 MG tablet Take 1 tablet (10 mg total) by mouth daily.   Marland Kitchen gabapentin (NEURONTIN) 300 MG capsule Take 300 mg by mouth at bedtime as needed.   . hydrochlorothiazide (HYDRODIURIL) 25 MG tablet Take 1 tablet (25 mg total) by mouth daily.   Marland Kitchen losartan (COZAAR) 100 MG tablet Take 1 tablet (100 mg total) by mouth daily.   . MULTIPLE VITAMIN PO Take by mouth daily.  05/05/2015: Received from: [facility]  . ONE TOUCH ULTRA TEST test strip 1 each by Other route as needed.  10/03/2015: Received from: External Pharmacy  . pravastatin (PRAVACHOL) 10 MG tablet TAKE 1 TABLET BY MOUTH EVERY DAY    No facility-administered encounter medications on file as of 11/26/2020.    Goals Addressed            This Visit's Progress   . PharmD - Medication Assistance       CARE PLAN ENTRY (see longitudinal plan of care for additional care plan information)  Current Barriers:  . Financial Barriers in complicated patient with multiple medical conditions including T2DM, CKD, GERD, HTN, HLD; patient has [employer] and reports copay for [place] is cost prohibitive at this time o Approved to receive Novolog and Levemir from [employer] patient assistance program 09/25/20-12/19/20  Pharmacist Clinical Goal(s):  Marland Kitchen Over the next 30 days, patient will work with PharmD  and providers to relieve medication access concerns  Interventions:  Perform chart review. Patient seen by PCP on 11/29 for dizziness and sinusitis follow up. Provider advise patient to:  Start nasal steroid Flonase 2 sprays in each nostril daily for 4-6 weeks, may repeat course seasonally or as needed  Follow up with patient today regarding blood sugar control and monitoring  Reports taking:  Levemir: 15 units nightly  NovoLog: 32 units before breakfast 15  units before lunch  32 units before supper  Patient verbalizes understanding that if blood glucose <70 before meal, take 1/2 scheduled dose for Novolog, as directed by Endocrinologist  Reports adherence to insulin regimen  Reports continuing to use Freestyle Libre 14 day CGM as has not yet received Freestyle 2 Libre CGM in mail.  Reports called Endocrinology yesterday to confirm order sent by provider.  States if new CGM has not arrived in mail today, will call [place] to follow up on order status  Reports recent blood sugar readings have been "doing better"  Reports morning fasting today: 163  Plans to bring CGM with him to appointment with Endocrinologist  Encourage patient and wife to keep log of dates, times and number of units of Levemir and Novolog given to share with provider with blood sugar readings from monitor  Counsel patient on importance of calling Endocrinology as needed for concerns related to his blood sugar  Have counseled patient and wife on s/s of low blood sugar and how to treat lows  Counsel on importance of having regular well-balanced meals and control of carbohydrate portions sizes . Counsel on importance of blood pressure control and monitoring o Denies monitoring home BP recently o Note BP from PCP office visit on 11/29: 135/53, HR 60 o Counsel patient to monitor home BP monitoring, keep log and contact office for readings outside of established parameters . Follow up regarding allergy control o Reports seasonal allergy symptoms controlled with use of fluticasone nasal spray as directed by PCP . Follow up regarding constipation. Reports constipation resolved for now. States is staying hydrated and is eating leafy greens and vegetables o Denies having needed to start Miralax . Continue to collaborate with [facility] CPhT Susy Frizzle for assistance to patient with re-enrolling for [employer] patient assistance for Levemir and Novolog for 2022 calendar  year o Note [facility] CPhT faxed patient's re-enrollment application to [employer] on 11/30  Patient Self Care Activities:  . Patient to take medications as prescribed o Note patient uses weekly pillbox as adherence aid . Patient will provide necessary portions of application  . Patient to attend medical appointments as scheduled o Next appointment with Endocrinologist on 1/19 (lab appointment on 1/12)  Please see past updates related to this goal by clicking on the "Past Updates" button in the selected goal         Plan  Telephone follow up appointment with care management team member scheduled for: 01/21/2021 at 12:30 pm  Harlow Asa, PharmD, [place] [phone]

## 2020-11-26 NOTE — Patient Instructions (Signed)
Thank you allowing the Chronic Care Management Team to be a part of your care! It was a pleasure speaking with you today!     CCM (Chronic Care Management) Team    Noreene Larsson RN, MSN, CCM Nurse Care Coordinator  [phone]   Harlow Asa PharmD  Clinical Pharmacist  [phone]   Eula Fried LCSW Clinical Social Worker [phone]  Visit Information  Goals Addressed            This Visit's Progress   . PharmD - Medication Assistance       CARE PLAN ENTRY (see longitudinal plan of care for additional care plan information)  Current Barriers:  . Financial Barriers in complicated patient with multiple medical conditions including T2DM, CKD, GERD, HTN, HLD; patient has [employer] and reports copay for [place] is cost prohibitive at this time o Approved to receive Novolog and Levemir from [employer] patient assistance program 09/25/20-12/19/20  Pharmacist Clinical Goal(s):  Marland Kitchen Over the next 30 days, patient will work with PharmD and providers to relieve medication access concerns  Interventions:  Perform chart review. Patient seen by PCP on 11/29 for dizziness and sinusitis follow up. Provider advise patient to:  Start nasal steroid Flonase 2 sprays in each nostril daily for 4-6 weeks, may repeat course seasonally or as needed  Follow up with patient today regarding blood sugar control and monitoring  Reports taking:  Levemir: 15 units nightly  NovoLog: 32 units before breakfast 15 units before lunch  32 units before supper  Patient verbalizes understanding that if blood glucose <70 before meal, take 1/2 scheduled dose for Novolog, as directed by Endocrinologist  Reports adherence to insulin regimen  Reports continuing to use Freestyle Libre 14 day CGM as has not yet received Freestyle 2 Libre CGM in mail.  Reports called Endocrinology yesterday to confirm order sent by provider.  States if new CGM has not arrived in mail  today, will call [place] to follow up on order status  Reports recent blood sugar readings have been "doing better"  Reports morning fasting today: 163  Plans to bring CGM with him to appointment with Endocrinologist  Encourage patient and wife to keep log of dates, times and number of units of Levemir and Novolog given to share with provider with blood sugar readings from monitor  Counsel patient on importance of calling Endocrinology as needed for concerns related to his blood sugar  Have counseled patient and wife on s/s of low blood sugar and how to treat lows  Counsel on importance of having regular well-balanced meals and control of carbohydrate portions sizes . Counsel on importance of blood pressure control and monitoring o Denies monitoring home BP recently o Note BP from PCP office visit on 11/29: 135/53, HR 60 o Counsel patient to monitor home BP monitoring, keep log and contact office for readings outside of established parameters . Follow up regarding allergy control o Reports seasonal allergy symptoms controlled with use of fluticasone nasal spray as directed by PCP . Follow up regarding constipation. Reports constipation resolved for now. States is staying hydrated and is eating leafy greens and vegetables o Denies having needed to start Miralax . Continue to collaborate with [facility] CPhT Susy Frizzle for assistance to patient with re-enrolling for [employer] patient assistance for Levemir and Novolog for 2022 calendar year o Note [facility] CPhT faxed patient's re-enrollment application to [employer] on 11/30  Patient Self Care Activities:  . Patient to take  medications as prescribed o Note patient uses weekly pillbox as adherence aid . Patient will provide necessary portions of application  . Patient to attend medical appointments as scheduled o Next appointment with Endocrinologist on 1/19 (lab appointment on 1/12)  Please see past updates related to this goal by  clicking on the "Past Updates" button in the selected goal         The patient verbalized understanding of instructions, educational materials, and care plan provided today and declined offer to receive copy of patient instructions, educational materials, and care plan.   Telephone follow up appointment with care management team member scheduled for: 01/21/2021 at 12:30 pm  Harlow Asa, PharmD, [place] [phone]

## 2020-12-18 ENCOUNTER — Other Ambulatory Visit: Payer: Self-pay | Admitting: Family Medicine

## 2020-12-31 LAB — HEMOGLOBIN A1C: Hemoglobin A1C: 8.1

## 2021-01-08 LAB — HM DIABETES EYE EXAM

## 2021-01-12 ENCOUNTER — Ambulatory Visit: Payer: Self-pay | Admitting: Pharmacist

## 2021-01-12 DIAGNOSIS — E119 Type 2 diabetes mellitus without complications: Secondary | ICD-10-CM

## 2021-01-12 DIAGNOSIS — Z794 Long term (current) use of insulin: Secondary | ICD-10-CM

## 2021-01-12 NOTE — Patient Instructions (Signed)
Thank you allowing the Care Management Team to be a part of your care! It was a pleasure speaking with you today!     Care Management Team    Noreene Larsson RN, MSN, CCM Nurse Care Coordinator  [phone]   Harlow Asa PharmD  Clinical Pharmacist  [phone]   Eula Fried LCSW Clinical Social Worker [phone]   Visit Information  Goals Addressed            This Visit's Progress   . PharmD - Medication Assistance       CARE PLAN ENTRY (see longitudinal plan of care for additional care plan information)  Current Barriers:  . Financial Barriers in complicated patient with multiple medical conditions including T2DM, CKD, GERD, HTN, HLD; patient has [employer] and reports copay for [place] is cost prohibitive at this time o Approved to receive Novolog and Levemir from [employer] patient assistance program 09/25/20-12/19/20  Pharmacist Clinical Goal(s):  Marland Kitchen Over the next 30 days, patient will work with PharmD and providers to relieve medication access concerns  Interventions: . Receive coordination of care message from [place] Simcox to let me know that patient was re-approved for patient assistance for Levemir and Novolog from [place] through 12/19/2021 and medication should be shipped to Dr. Joycie Peek office ~ last week of January. . Follow up with Mr. Mcfarlan today to let him know.  o Patient reports that he has not yet heard from Dr. Joycie Peek office about the medication arriving, but that he has a sufficient supply of both Novolog and Levemir for now. . Patient states that he is running low on his Freestyle [place] 2 sensors. o Reports has already followed up with [facility] and been informed that there were delays in shipment o Reports that if sensors do not arrive in mail by today, will follow up with Endocrinology office to see if any samples are available/discuss monitoring with glucometer if runs out of sensors . Will follow  up with patient further regarding diabetes and hypertension as previously scheduled next week  Patient Self Care Activities:  . Patient to take medications as prescribed o Note patient uses weekly pillbox as adherence aid . Patient will provide necessary portions of application  . Patient to attend medical appointments as scheduled  Please see past updates related to this goal by clicking on the "Past Updates" button in the selected goal         The patient verbalized understanding of instructions, educational materials, and care plan provided today and declined offer to receive copy of patient instructions, educational materials, and care plan.   Telephone follow up appointment with care management team member scheduled for: 01/21/2021 at 12:30 pm  Harlow Asa, PharmD, [place] [phone]

## 2021-01-12 NOTE — Chronic Care Management (AMB) (Signed)
Chronic Care Management   Follow Up Note   01/12/2021 Name: Stephen White MRN: [id number] DOB: 1936/06/20  Referred by: Stephen Hauser, DO Reason for referral : Chronic Care Management (Patient Phone Call)   Stephen White is a 85 y.o. year old male who is a primary care patient of Stephen Hauser, DO. The CCM team was consulted for assistance with chronic disease management and care coordination needs.    I reached out to [name] by phone today.   Review of patient status, including review of consultants reports, relevant laboratory and other test results, and collaboration with appropriate care team members and the patient's provider was performed as part of comprehensive patient evaluation and provision of chronic care management services.    SDOH (Social Determinants of Health) assessments performed: No See Care Plan activities for detailed interventions related to [facility])     Outpatient Encounter Medications as of 01/12/2021  Medication Sig Note  . aspirin 81 MG tablet Take 81 mg by mouth daily.  05/05/2015: Received from: [facility]  . Biotin (BIOTIN 5000) 5 MG CAPS Take by mouth daily.    . Carboxymethylcellulose Sodium (EYE DROPS OP) Apply to eye.    . Continuous Blood Gluc Receiver (FREESTYLE LIBRE 14 DAY READER) DEVI by Does not apply route.   . Emollient (GOLD BOND ULTIMATE) LOTN Apply topically at bedtime. Diabetic lotion   . famotidine (PEPCID) 10 MG tablet Take 1 tablet (10 mg total) by mouth daily.   . fluticasone (FLONASE) 50 MCG/ACT nasal spray Place 2 sprays into both nostrils daily. Use for 4-6 weeks then stop and use seasonally or as needed.   . gabapentin (NEURONTIN) 300 MG capsule Take 300 mg by mouth at bedtime as needed.   . hydrochlorothiazide (HYDRODIURIL) 25 MG tablet Take 1 tablet (25 mg total) by mouth daily.   . insulin aspart (NOVOLOG) 100 UNIT/ML FlexPen Take NovoLog insulin 10-15 minutes BEFORE you eat.  Take  NovoLog 32 units before breakfast Take NovoLog 15 units before lunch Take NovoLog 32 units before supper.  Adjust for CBG <70 following direction from Endocrinologist   . insulin detemir (LEVEMIR) 100 UNIT/ML FlexPen Inject into the skin. Inject 15 Units subcutaneously nightly   . loratadine (ALLERGY RELIEF) 10 MG tablet Take 10 mg by mouth every other day as needed for allergies.    Marland Kitchen losartan (COZAAR) 100 MG tablet TAKE 1 TABLET(100 MG) BY MOUTH DAILY   . MULTIPLE VITAMIN PO Take by mouth daily.  05/05/2015: Received from: [facility]  . ONE TOUCH ULTRA TEST test strip 1 each by Other route as needed.  10/03/2015: Received from: External Pharmacy  . pravastatin (PRAVACHOL) 10 MG tablet TAKE 1 TABLET BY MOUTH EVERY DAY    No facility-administered encounter medications on file as of 01/12/2021.    Goals Addressed            This Visit's Progress   . PharmD - Medication Assistance       CARE PLAN ENTRY (see longitudinal plan of care for additional care plan information)  Current Barriers:  . Financial Barriers in complicated patient with multiple medical conditions including T2DM, CKD, GERD, HTN, HLD; patient has [employer] and reports copay for [place] is cost prohibitive at this time o Approved to receive Novolog and Levemir from [employer] patient assistance program 09/25/20-12/19/20  Pharmacist Clinical Goal(s):  Marland Kitchen Over the next 30 days, patient will work with PharmD and providers  to relieve medication access concerns  Interventions: . Receive coordination of care message from [place] Simcox to let me know that patient was re-approved for patient assistance for Levemir and Novolog from [place] through 12/19/2021 and medication should be shipped to Stephen White office ~ last week of January. . Follow up with Stephen White today to let him know.  o Patient reports that he has not yet heard from Stephen White office about the medication  arriving, but that he has a sufficient supply of both Novolog and Levemir for now. . Patient states that he is running low on his Freestyle [place] 2 sensors. o Reports has already followed up with Stephen White and been informed that there were delays in shipment o Reports that if sensors do not arrive in mail by today, he will follow up with Endocrinology office to see if any samples are available/discuss monitoring with glucometer if runs out of sensors . Will follow up with patient further regarding diabetes and hypertension as previously scheduled next week  Patient Self Care Activities:  . Patient to take medications as prescribed o Note patient uses weekly pillbox as adherence aid . Patient will provide necessary portions of application  . Patient to attend medical appointments as scheduled  Please see past updates related to this goal by clicking on the "Past Updates" button in the selected goal         Plan  Telephone follow up appointment with care management team member scheduled for: 01/21/2021 at 12:30 pm  Stephen White, PharmD, [place] [phone]

## 2021-01-17 ENCOUNTER — Other Ambulatory Visit: Payer: Self-pay | Admitting: Family Medicine

## 2021-01-17 NOTE — Telephone Encounter (Signed)
Requested Prescriptions  Pending Prescriptions Disp Refills  . pravastatin (PRAVACHOL) 10 MG tablet [Pharmacy Med Name: PRAVASTATIN 10MG  TABLETS] 90 tablet 2    Sig: TAKE 1 TABLET BY MOUTH EVERY DAY     Cardiovascular:  Antilipid - Statins Passed - 01/17/2021  7:01 AM      Passed - Total Cholesterol in normal range and within 360 days    Cholesterol, Total  Date Value Ref Range Status  11/07/2018 162 100 - 199 mg/dL Final   Cholesterol  Date Value Ref Range Status  08/01/2020 152 <200 mg/dL Final         Passed - LDL in normal range and within 360 days    LDL Cholesterol (Calc)  Date Value Ref Range Status  08/01/2020 82 mg/dL (calc) Final    Comment:    Reference range: <100 . Desirable range <100 mg/dL for primary prevention;   <70 mg/dL for patients with CHD or diabetic patients  with > or = 2 CHD risk factors. Marland Kitchen LDL-C is now calculated using the Martin-Hopkins  calculation, which is a validated novel method providing  better accuracy than the Friedewald equation in the  estimation of LDL-C.  Cresenciano Genre et al. Annamaria Helling. 8657;846(96): 2061-2068  (http://education.[employer].com/faq/FAQ164)          Passed - HDL in normal range and within 360 days    HDL  Date Value Ref Range Status  08/01/2020 56 > OR = 40 mg/dL Final  11/07/2018 54 >39 mg/dL Final         Passed - Triglycerides in normal range and within 360 days    Triglycerides  Date Value Ref Range Status  08/01/2020 68 <150 mg/dL Final         Passed - Patient is not pregnant      Passed - Valid encounter within last 12 months    Recent Outpatient Visits          2 months ago Type 2 diabetes mellitus treated with insulin [facility])   [place], DO   3 months ago Flu vaccine need   [facility], Lupita Raider, FNP   4 months ago Benign hypertension with CKD (chronic kidney disease) stage IV [facility])   [place], DO   5 months ago Annual physical exam   [facility] Olin Hauser, DO   8 months ago Essential (primary) hypertension   [place], DO      Future Appointments            In 1 week Ralene Bathe, MD [place]   In 1 month [facility], [facility]

## 2021-01-21 ENCOUNTER — Telehealth: Payer: Self-pay

## 2021-01-26 ENCOUNTER — Ambulatory Visit: Payer: Medicare HMO | Admitting: Dermatology

## 2021-02-02 ENCOUNTER — Other Ambulatory Visit: Payer: Self-pay

## 2021-02-02 ENCOUNTER — Ambulatory Visit (INDEPENDENT_AMBULATORY_CARE_PROVIDER_SITE_OTHER): Payer: Medicare HMO | Admitting: Pharmacist

## 2021-02-02 DIAGNOSIS — E119 Type 2 diabetes mellitus without complications: Secondary | ICD-10-CM | POA: Diagnosis not present

## 2021-02-02 DIAGNOSIS — E785 Hyperlipidemia, unspecified: Secondary | ICD-10-CM | POA: Diagnosis not present

## 2021-02-02 DIAGNOSIS — E1169 Type 2 diabetes mellitus with other specified complication: Secondary | ICD-10-CM

## 2021-02-02 DIAGNOSIS — Z794 Long term (current) use of insulin: Secondary | ICD-10-CM

## 2021-02-02 DIAGNOSIS — I129 Hypertensive chronic kidney disease with stage 1 through stage 4 chronic kidney disease, or unspecified chronic kidney disease: Secondary | ICD-10-CM

## 2021-02-02 DIAGNOSIS — N184 Chronic kidney disease, stage 4 (severe): Secondary | ICD-10-CM | POA: Diagnosis not present

## 2021-02-02 MED ORDER — HYDROCHLOROTHIAZIDE 25 MG PO TABS: 25.0000 mg | ORAL_TABLET | Freq: Every day | ORAL | 1 refills | Status: DC

## 2021-02-02 NOTE — Patient Instructions (Signed)
Visit Information  PATIENT GOALS:  Please review Healthy Meal Planning educational handout coming in the mail   Please remember to keep log of blood pressure when monitoring at home and bring record with you to upcoming medical appointments   The patient verbalized understanding of instructions, educational materials, and care plan provided today and declined offer to receive copy of patient instructions, educational materials, and care plan.   Telephone follow up appointment with care management team member scheduled for: 3/28 at 12:30pm  Harlow Asa, PharmD, [place] [phone]

## 2021-02-02 NOTE — Chronic Care Management (AMB) (Signed)
Chronic Care Management Pharmacy Note  02/02/2021 Name:  Stephen White MRN:  [id number] DOB:  1936/05/31  Subjective: Stephen White is an 85 y.o. year old male who is a primary patient of Stephen Hauser, DO.  The CCM team was consulted for assistance with disease management and care coordination needs.    Engaged with patient by telephone for follow up visit in response to provider referral for pharmacy case management and/or care coordination services.   Consent to Services:  The patient was given information about Chronic Care Management services, agreed to services, and gave verbal consent prior to initiation of services.  Please see initial visit note for detailed documentation.   Objective:  Lab Results  Component Value Date   CREATININE 2.62 (H) 09/08/2020   CREATININE 2.5 (A) 03/06/2020   CREATININE 2.32 (H) 11/07/2018    Lab Results  Component Value Date   HGBA1C 8.4 09/17/2020  Per shared record from [place], A1C 8.1% on 12/31/2020     Component Value Date/Time   CHOL 152 08/01/2020 0838   CHOL 162 11/07/2018 0958   TRIG 68 08/01/2020 0838   HDL 56 08/01/2020 0838   HDL 54 11/07/2018 0958   CHOLHDL 2.7 08/01/2020 0838   LDLCALC 82 08/01/2020 0838     BP Readings from Last 3 Encounters:  11/17/20 (!) 135/53  10/16/20 (!) 156/57  09/03/20 (!) 137/50    Assessment: Review of patient past medical history, allergies, medications, health status, including review of consultants reports, laboratory and other test data, was performed as part of comprehensive evaluation and provision of chronic care management services.   SDOH:  (Social Determinants of Health) assessments and interventions performed: none   CCM Care Plan  Allergies  Allergen Reactions  . Prednisone     Unsure reaction     Medications Reviewed Today    Reviewed by Vella Raring, [facility] (Pharmacist) on 02/02/21 at [place] List Status: <None>  Medication  Order Taking? Sig Documenting Provider Last Dose Status Informant  aspirin 81 MG tablet [id number]  Take 81 mg by mouth daily.  [provider]  Active            Med Note Emmaline Kluver May 05, 2015 12:22 PM) Received from: [facility] (BIOTIN 5000) 5 MG CAPS [id number]  Take by mouth daily.  [provider]  Active   Carboxymethylcellulose Sodium (EYE DROPS OP) [id number]  Apply to eye.  [provider]  Active   Continuous Blood Gluc Receiver (FREESTYLE LIBRE 14 DAY READER) DEVI [id number]  by Does not apply route. [provider]  Active   Emollient (GOLD Thedore MinsSharlyne Pacas [id number]  Apply topically at bedtime. Diabetic lotion [provider]  Active   famotidine (PEPCID) 10 MG tablet [id number]  Take 1 tablet (10 mg total) by mouth daily. Karamalegos, Devonne Doughty, DO  Active   fluticasone (FLONASE) 50 MCG/ACT nasal spray [id number] Yes Place 2 sprays into both nostrils daily. Use for 4-6 weeks then stop and use seasonally or as needed. Stephen Hauser, DO Taking Active   gabapentin (NEURONTIN) 300 MG capsule [id number]  Take 300 mg by mouth at bedtime as needed. [provider]  Active   hydrochlorothiazide (HYDRODIURIL) 25 MG tablet [id number] Yes Take 1 tablet (25 mg total) by mouth daily. Stephen Hauser, DO Taking Active   insulin aspart (NOVOLOG) 100 UNIT/ML FlexPen [id number] Yes Take NovoLog insulin  10-15 minutes BEFORE you eat.  Take NovoLog 32 units before breakfast Take NovoLog 15 units before lunch Take NovoLog 32 units before supper.  Adjust for CBG <70 following direction from Endocrinologist [provider] Taking Active   insulin detemir (LEVEMIR) 100 UNIT/ML FlexPen [id number] Yes Inject into the skin. Inject 15 Units subcutaneously nightly [provider] Taking Active   loratadine (ALLERGY RELIEF) 10 MG tablet [id number]  Take 10 mg by mouth every  other day as needed for allergies.  [provider]  Active   losartan (COZAAR) 100 MG tablet [id number] Yes TAKE 1 TABLET(100 MG) BY MOUTH DAILY Jerrol Banana., MD Taking Active   MULTIPLE VITAMIN PO [id number]  Take by mouth daily.  [provider]  Active            Med Note Emmaline Kluver May 05, 2015 12:22 PM) Received from: [place] TEST test strip [id number]  1 each by Other route as needed.  [provider]  Active            Med Note Doristine Devoid   Fri Oct 03, 2015  9:40 AM) Received from: External Pharmacy  pravastatin (PRAVACHOL) 10 MG tablet [id number] Yes TAKE 1 TABLET BY MOUTH EVERY DAY Stephen Hauser, DO Taking Active           Patient Active Problem List   Diagnosis Date Noted  . Chronic frontal sinusitis 11/17/2020  . Flu vaccine need 10/16/2020  . Episodic lightheadedness 04/01/2020  . Type 2 diabetes mellitus with diabetic nephropathy, with long-term current use of insulin ([place]) 02/14/2019  . Flatulence 11/03/2016  . Multiple actinic keratoses 05/05/2015  . Arthropathia 05/05/2015  . CAFL (chronic airflow limitation) ([place]) 05/05/2015  . 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 Gastro-esophageal reflux disease without esophagitis 05/05/2015  . Retinopathy 12/23/2014  . Microalbuminuria 12/23/2014  . CKD (chronic kidney disease), stage IV ([place]) 05/07/2010  . Allergic rhinitis 05/14/2008  . Benign hypertension with CKD (chronic kidney disease) stage IV ([place]) 10/18/2007  . HLD (hyperlipidemia) 09/06/2007    Conditions to be addressed/monitored: HTN, HLD and DMII  Care Plan : PharmD - Medication Managment and Assistance  Updates made by Vella Raring, RPH since 02/02/2021 12:00 AM    Problem: Disease Progression     Long-Range Goal: Disease Progression Prevented or Minimized   Start Date: 02/02/2021  Expected End Date: 05/03/2021  This Visit's  Progress: On track  Priority: High  Note:   Current Barriers:  . Financial Barriers in complicated patient with multiple medical conditions including T2DM, CKD, GERD, HTN, HLD; patient has [employer] and reports copay for [place] is cost prohibitive at this time o Approved to receive Novolog and Levemir from [employer] patient assistance program through 12/19/2021  Pharmacist Clinical Goal(s):  Marland Kitchen Over the next 90 days, patient will achieve adherence to monitoring guidelines and medication adherence to achieve therapeutic efficacy through collaboration with PharmD and provider.  .   Interventions: . 1:1 collaboration with Stephen Hauser, DO regarding development and update of comprehensive plan of care as evidenced by provider attestation and co-signature . Inter-disciplinary care team collaboration (see longitudinal plan of care)  Type 2 Diabetes o Perform chart review. Patient seen by Endocrinologist on 1/19  A1C improved to 8.1% on 1/12 (previously 8.4% on 9/29) o Reports taking:  Levemir: 15 units nightly  NovoLog: 32 units before breakfast 15 units  before lunch  32 units before supper ? Patient verbalizes understanding that if blood glucose <70 before meal, take 1/2 scheduled dose for Novolog, as directed by Endocrinologist ? Reports adherence to insulin regimen from Endocrinologist ? Reports administering Novolog doses 15 minutes prior to meals as directed by Endocrinologist ? Reports using FreeStyle [place] 2 continuous glucose monitor ? Remind patient to order sensor refills from [facility] ? Reports recent blood sugar readings: Fasting before breakfast: ~160s-170s; After breakfast ranging: 205-300 ? Denies s/s of low blood sugar ? Patient notes blood sugar readings after breakfast vary ? Reports typical breakfast:  ? 2 boiled eggs, sausage, apple sauce and black coffee OR ? Cereal (Honey Bunches of Oats with Almonds) with almond  milk and blueberries ? Counsel on importance of having regular balanced meals and reviewing nutrition labels for carbohydrate content ? Will mail patient educational handout on diabetes and [place] as requested. ? Counsel patient on importance of calling Endocrinology as needed for concerns related to his blood sugar ? Have counseled patient and wife on s/s of low blood sugar and how to treat lows  Hypertension . Current regimen: o HCTZ 25 mg daily o Losartan 100 mg daily . Denies monitoring home BP recently . Note BP from Endocrinology visit on 1/19: 124/80, HR 71 . Counsel patient to monitor home BP monitoring, keep log and contact office for readings outside of established parameters . Patient reports current supply of HCTZ is running low - note from review of chart new Rx for HCTZ 25 mg daily sent to pharmacy by PCP today.  o Patient reports will pick up supply from pharmacy.  Hyperlipidemia . Current regimen: pravastatin 10 mg daily . Counsel on importance of reviewing nutrition labels for cholesterol content.  Patient Goals/Self-Care Activities . Over the next 90 days, patient will:  - take medications as prescribed - monitor blood glucose as directed by Endocrinologist  Note patient using FreeStyle Libre 2 continuous glucose monitor - check blood pressure, document, and provide at future appointments - attend medical appointments as scheduled   Next appointment with PCP on 3/1 Next appointment with Endocrinology on 4/25 (lab work on 4/18)  Follow Up Plan: Telephone follow up appointment with care management team member scheduled for: 3/28 at 12:30pm      Medication Assistance: Novolog and Levemir obtained through [employer] medication assistance program.  Enrollment ends 12/19/2021  Follow Up:  Patient agrees to Care Plan and Follow-up.  Harlow Asa, PharmD, [place] [phone]

## 2021-02-10 ENCOUNTER — Other Ambulatory Visit: Payer: Self-pay | Admitting: Family Medicine

## 2021-02-10 DIAGNOSIS — K219 Gastro-esophageal reflux disease without esophagitis: Secondary | ICD-10-CM

## 2021-02-17 ENCOUNTER — Encounter: Payer: Self-pay | Admitting: Family Medicine

## 2021-02-17 ENCOUNTER — Other Ambulatory Visit: Payer: Self-pay

## 2021-02-17 ENCOUNTER — Ambulatory Visit (INDEPENDENT_AMBULATORY_CARE_PROVIDER_SITE_OTHER): Payer: Medicare HMO | Admitting: Family Medicine

## 2021-02-17 VITALS — BP 145/55 | HR 72 | Ht 67.0 in | Wt 191.2 lb

## 2021-02-17 DIAGNOSIS — R42 Dizziness and giddiness: Secondary | ICD-10-CM

## 2021-02-17 DIAGNOSIS — Z794 Long term (current) use of insulin: Secondary | ICD-10-CM

## 2021-02-17 DIAGNOSIS — R2689 Other abnormalities of gait and mobility: Secondary | ICD-10-CM | POA: Diagnosis not present

## 2021-02-17 DIAGNOSIS — N184 Chronic kidney disease, stage 4 (severe): Secondary | ICD-10-CM

## 2021-02-17 DIAGNOSIS — I129 Hypertensive chronic kidney disease with stage 1 through stage 4 chronic kidney disease, or unspecified chronic kidney disease: Secondary | ICD-10-CM | POA: Diagnosis not present

## 2021-02-17 DIAGNOSIS — E1121 Type 2 diabetes mellitus with diabetic nephropathy: Secondary | ICD-10-CM

## 2021-02-17 DIAGNOSIS — E119 Type 2 diabetes mellitus without complications: Secondary | ICD-10-CM | POA: Diagnosis not present

## 2021-02-17 NOTE — Patient Instructions (Addendum)
Thank you for coming to the office today.  Stop Biotin to see if causing dizziness.  BP is good.  Recent Labs    03/06/20 0000 09/17/20 0000 12/31/20 0000  HGBA1C 9.2 8.4 8.1    Keep up good work with sugars.   [name] (Endocrinology) Encounter Start Encounter End  04/06/2021 9:30 AM BLOOD WORK    04/10/2021 2:00 PM - Office visit with [name]   DUE for FASTING BLOOD WORK (no food or drink after midnight before the lab appointment, only water or coffee without cream/sugar on the morning of)  SCHEDULE "Lab Only" visit in the morning at the clinic for lab draw in 1 WEEK  - Make sure Lab Only appointment is at about 1 week before your next appointment, so that results will be available  For Lab Results, once available within 2-3 days of blood draw, you can can log in to MyChart online to view your results and a brief explanation. Also, we can discuss results at next follow-up visit.   Please schedule a Follow-up Appointment to: Return in about 1 week (around 02/24/2021) for 1 week non fasting lab only - then follow-up 6 months Annual Physical and fasting lab AFTER.  If you have any other questions or concerns, please feel free to call the office or send a message through [place]. You may also schedule an earlier appointment if necessary.  Additionally, you may be receiving a survey about your experience at our office within a few days to 1 week by e-mail or mail. We value your feedback.  Nobie Putnam, DO [place]

## 2021-02-17 NOTE — Assessment & Plan Note (Signed)
Well-controlled HTN - Home BP readings reviewed  Complication with CKD IV Off Amlodipine   Plan:  1. Continue current BP regimen  On Losartan 100mg  daily, HCTZ 12.5mg  daily 2. Encourage improved lifestyle - low sodium diet, regular exercise 3. Continue monitor BP outside office, bring readings to next visit, if persistently >140/90 or new symptoms notify office sooner  Check BMET chemistry for Cr trend in 1 week  Future may reduce BP meds further

## 2021-02-17 NOTE — Assessment & Plan Note (Signed)
Followed by [facility] Last A1c resulted on chart today 8.1 (03/9970) Complications - CKD IV, DM retinopathy other including hyperlipidemia,increases risk of future cardiovascular complications   Plan:  1. Continue current therapy - insulin management per Endocrine improved control on CGM 2. Encourage improved lifestyle - low carb, low sugar diet, reduce portion size, continue improving regular exercise 3. Check CBG , bring log to next visit for review - CGM has today, reviewed 4. Continue ASA, ARB, Statin

## 2021-02-17 NOTE — Progress Notes (Signed)
Subjective:    Patient ID: Stephen White, male    DOB: [birth year], 85 y.o.   MRN: [id number]  Stephen White is a 85 y.o. male presenting on 02/17/2021 for Hypertension and Dizziness   HPI  T2DM Followed by [name] for Diabetes Last visit 01/07/21, A1c 8.1% (12/31/20) He says that he often has significant elevated CBG 300-350 at peak, otherwise. He now has CGM [employer]. Prior 2 week data avg 166. Target goal 80-180. He has no longer had hypoglycemia episodes. Occasionally hyperglycemia complication. -Levemir 09T QHS /  Novolog 32u breakfast / 15 u lunch / 32 dinner - as per Endocrinology He had one reported concerning hypoglycemia in the past as reported. Has done better now  History of Dizziness He admits episodic dizziness during day and night at times, but not always now, seems to occur about the same. Often can take place after breakfast. No longer on benadryl / diphenhydramine He asks about possible side effect on Biotin. He was taking it intermittent every other day to help hair loss and help fingernails.  Chronic Sinusitis Describes persistent sinus and allergy problem, uses nasal saline and OTC oral anti histamine. On Flonase regularly, not missing doses. Difficulty breathing well at night if lay on side, he can breathe on his back however well.  Upcoming L cataract extraction by Dr Wallace Going scheduled [facility] 04/03/21   Depression screen [facility] 2/9 11/17/2020 08/15/2020 08/12/2020  Decreased Interest 0 0 0  Down, Depressed, Hopeless 0 0 0  PHQ - 2 Score 0 0 0    Social History   Tobacco Use  . Smoking status: Former Smoker    Packs/day: 0.30    Years: 25.00    Pack years: 7.50    Types: Cigarettes    Quit date: 12/19/1978    Years since quitting: 42.1  . Smokeless tobacco: Former Network engineer  . Vaping Use: Never used  Substance Use Topics  . Alcohol use: Yes    Alcohol/week: 0.0 standard drinks  . Drug use: No    Review of Systems Per HPI  unless specifically indicated above     Objective:    BP (!) 145/55   Pulse 72   Ht 5\' 7"  (1.702 m)   Wt 191 lb 3.2 oz (86.7 kg)   SpO2 99%   BMI 29.95 kg/m   Wt Readings from Last 3 Encounters:  02/17/21 191 lb 3.2 oz (86.7 kg)  11/17/20 194 lb (88 kg)  10/16/20 193 lb 9.6 oz (87.8 kg)    Physical Exam Vitals and nursing note reviewed.  Constitutional:      General: He is not in acute distress.    Appearance: He is well-developed and well-nourished. He is not diaphoretic.     Comments: Well-appearing, comfortable, cooperative  HENT:     Head: Normocephalic and atraumatic.     Mouth/Throat:     Mouth: Oropharynx is clear and moist.  Eyes:     General:        Right eye: No discharge.        Left eye: No discharge.     Conjunctiva/sclera: Conjunctivae normal.  Cardiovascular:     Rate and Rhythm: Normal rate.  Pulmonary:     Effort: Pulmonary effort is normal.  Musculoskeletal:        General: No edema.  Skin:    General: Skin is warm and dry.     Findings: No erythema or rash.  Neurological:  Mental Status: He is alert and oriented to person, place, and time.  Psychiatric:        Mood and Affect: Mood and affect normal.        Behavior: Behavior normal.     Comments: Well groomed, good eye contact, normal speech and thoughts    Results for orders placed or performed in visit on 02/17/21  Hemoglobin A1c  Result Value Ref Range   Hemoglobin A1C 8.1       Assessment & Plan:   Problem List Items Addressed This Visit    Type 2 diabetes mellitus with diabetic nephropathy, with long-term current use of insulin ([place])    Followed by [facility] Last A1c resulted on chart today 8.1 (01/3535) Complications - CKD IV, DM retinopathy other including hyperlipidemia,increases risk of future cardiovascular complications   Plan:  1. Continue current therapy - insulin management per Endocrine improved control on CGM 2. Encourage improved lifestyle - low carb,  low sugar diet, reduce portion size, continue improving regular exercise 3. Check CBG , bring log to next visit for review - CGM has today, reviewed 4. Continue ASA, ARB, Statin       CKD (chronic kidney disease), stage IV (HCC)   Benign hypertension with CKD (chronic kidney disease) stage IV (HCC)    Well-controlled HTN - Home BP readings reviewed  Complication with CKD IV Off Amlodipine   Plan:  1. Continue current BP regimen  On Losartan 100mg  daily, HCTZ 12.5mg  daily 2. Encourage improved lifestyle - low sodium diet, regular exercise 3. Continue monitor BP outside office, bring readings to next visit, if persistently >140/90 or new symptoms notify office sooner  Check BMET chemistry for Cr trend in 1 week  Future may reduce BP meds further      Relevant Orders   BASIC METABOLIC PANEL WITH GFR    Other Visit Diagnoses    Type 2 diabetes mellitus treated with insulin ([place])    -  Primary   Relevant Orders   BASIC METABOLIC PANEL WITH GFR   Balance disorder       Dizziness          #Balance / Dizziness Chronic issues No new major change, still seems to have persistent symptoms He will try trial and error of holding or stopping some natural supplements, says Biotin may be causing it, he will stop that for now and follow-up  No orders of the defined types were placed in this encounter.   Orders Placed This Encounter  Procedures  . Hemoglobin A1c    This external order was created through the Results Console.  Marland Kitchen BASIC METABOLIC PANEL WITH GFR    Standing Status:   Future    Standing Expiration Date:   05/20/2021     Follow up plan: Return in about 1 week (around 02/24/2021) for 1 week non fasting lab only - then follow-up 6 months Annual Physical and fasting lab AFTER.   Nobie Putnam, [facility] 02/17/2021, 1:55 PM

## 2021-02-23 ENCOUNTER — Other Ambulatory Visit: Payer: Self-pay | Admitting: *Deleted

## 2021-02-23 DIAGNOSIS — I129 Hypertensive chronic kidney disease with stage 1 through stage 4 chronic kidney disease, or unspecified chronic kidney disease: Secondary | ICD-10-CM

## 2021-02-23 DIAGNOSIS — Z794 Long term (current) use of insulin: Secondary | ICD-10-CM

## 2021-02-23 DIAGNOSIS — E119 Type 2 diabetes mellitus without complications: Secondary | ICD-10-CM

## 2021-02-23 DIAGNOSIS — N184 Chronic kidney disease, stage 4 (severe): Secondary | ICD-10-CM

## 2021-02-24 ENCOUNTER — Other Ambulatory Visit: Payer: Medicare HMO

## 2021-02-24 ENCOUNTER — Other Ambulatory Visit: Payer: Self-pay

## 2021-02-25 ENCOUNTER — Other Ambulatory Visit: Payer: Self-pay | Admitting: Family Medicine

## 2021-02-25 DIAGNOSIS — Z794 Long term (current) use of insulin: Secondary | ICD-10-CM

## 2021-02-25 DIAGNOSIS — N184 Chronic kidney disease, stage 4 (severe): Secondary | ICD-10-CM

## 2021-02-25 DIAGNOSIS — E1121 Type 2 diabetes mellitus with diabetic nephropathy: Secondary | ICD-10-CM

## 2021-02-25 DIAGNOSIS — I129 Hypertensive chronic kidney disease with stage 1 through stage 4 chronic kidney disease, or unspecified chronic kidney disease: Secondary | ICD-10-CM

## 2021-02-25 DIAGNOSIS — K219 Gastro-esophageal reflux disease without esophagitis: Secondary | ICD-10-CM

## 2021-02-25 LAB — BASIC METABOLIC PANEL WITH GFR
BUN/Creatinine Ratio: 16 (calc) (ref 6–22)
BUN: 44 mg/dL — ABNORMAL HIGH (ref 7–25)
CO2: 26 mmol/L (ref 20–32)
Calcium: 9.2 mg/dL (ref 8.6–10.3)
Chloride: 104 mmol/L (ref 98–110)
Creat: 2.69 mg/dL — ABNORMAL HIGH (ref 0.70–1.11)
GFR, Est African American: 24 mL/min/{1.73_m2} — ABNORMAL LOW (ref >=60)
GFR, Est Non African American: 21 mL/min/{1.73_m2} — ABNORMAL LOW (ref >=60)
Glucose, Bld: 156 mg/dL — ABNORMAL HIGH (ref 65–99)
Potassium: 4.4 mmol/L (ref 3.5–5.3)
Sodium: 139 mmol/L (ref 135–146)

## 2021-02-25 MED ORDER — FAMOTIDINE 10 MG PO TABS: 10.0000 mg | ORAL_TABLET | Freq: Every day | ORAL | 0 refills | Status: DC

## 2021-02-25 NOTE — Telephone Encounter (Signed)
Copied from [place] [id number]. Topic: Quick Communication - Rx Refill/Question >> Feb 25, 2021 11:47 AM Yvette Rack wrote: Medication: famotidine (PEPCID) 10 MG tablet  Has the patient contacted their pharmacy? yes but Rx not being refilled  Preferred Pharmacy (with phone number or street name): [facility] [place], [address] AT [address] Phone: [phone]   Fax: [phone]  Agent: Please be advised that RX refills may take up to 3 business days. We ask that you follow-up with your pharmacy.

## 2021-03-09 ENCOUNTER — Other Ambulatory Visit: Payer: Self-pay

## 2021-03-09 ENCOUNTER — Encounter: Payer: Self-pay | Admitting: Family Medicine

## 2021-03-09 ENCOUNTER — Emergency Department
Admission: EM | Admit: 2021-03-09 | Discharge: 2021-03-09 | Discharge status: Against Medical Advice | Payer: Medicare HMO | Attending: Emergency Medicine | Admitting: Emergency Medicine

## 2021-03-09 ENCOUNTER — Emergency Department: Payer: Medicare HMO

## 2021-03-09 ENCOUNTER — Encounter: Payer: Self-pay | Admitting: Emergency Medicine

## 2021-03-09 ENCOUNTER — Ambulatory Visit (INDEPENDENT_AMBULATORY_CARE_PROVIDER_SITE_OTHER): Payer: Medicare HMO | Admitting: Family Medicine

## 2021-03-09 VITALS — BP 141/53 | HR 73 | Ht 67.0 in | Wt 195.8 lb

## 2021-03-09 DIAGNOSIS — E079 Disorder of thyroid, unspecified: Secondary | ICD-10-CM | POA: Diagnosis not present

## 2021-03-09 DIAGNOSIS — R42 Dizziness and giddiness: Secondary | ICD-10-CM | POA: Diagnosis not present

## 2021-03-09 DIAGNOSIS — R0602 Shortness of breath: Secondary | ICD-10-CM

## 2021-03-09 DIAGNOSIS — E1122 Type 2 diabetes mellitus with diabetic chronic kidney disease: Secondary | ICD-10-CM | POA: Diagnosis not present

## 2021-03-09 DIAGNOSIS — N184 Chronic kidney disease, stage 4 (severe): Secondary | ICD-10-CM | POA: Diagnosis not present

## 2021-03-09 DIAGNOSIS — R519 Headache, unspecified: Secondary | ICD-10-CM | POA: Diagnosis not present

## 2021-03-09 DIAGNOSIS — R269 Unspecified abnormalities of gait and mobility: Secondary | ICD-10-CM | POA: Diagnosis not present

## 2021-03-09 DIAGNOSIS — E1121 Type 2 diabetes mellitus with diabetic nephropathy: Secondary | ICD-10-CM | POA: Diagnosis not present

## 2021-03-09 DIAGNOSIS — Z79899 Other long term (current) drug therapy: Secondary | ICD-10-CM | POA: Insufficient documentation

## 2021-03-09 DIAGNOSIS — R2689 Other abnormalities of gait and mobility: Secondary | ICD-10-CM | POA: Diagnosis not present

## 2021-03-09 DIAGNOSIS — I129 Hypertensive chronic kidney disease with stage 1 through stage 4 chronic kidney disease, or unspecified chronic kidney disease: Secondary | ICD-10-CM | POA: Insufficient documentation

## 2021-03-09 DIAGNOSIS — R0609 Other forms of dyspnea: Secondary | ICD-10-CM

## 2021-03-09 DIAGNOSIS — Z87891 Personal history of nicotine dependence: Secondary | ICD-10-CM | POA: Diagnosis not present

## 2021-03-09 DIAGNOSIS — Z7982 Long term (current) use of aspirin: Secondary | ICD-10-CM | POA: Insufficient documentation

## 2021-03-09 DIAGNOSIS — Z85828 Personal history of other malignant neoplasm of skin: Secondary | ICD-10-CM | POA: Diagnosis not present

## 2021-03-09 DIAGNOSIS — R062 Wheezing: Secondary | ICD-10-CM | POA: Diagnosis not present

## 2021-03-09 DIAGNOSIS — R0600 Dyspnea, unspecified: Secondary | ICD-10-CM | POA: Diagnosis not present

## 2021-03-09 DIAGNOSIS — Z794 Long term (current) use of insulin: Secondary | ICD-10-CM | POA: Insufficient documentation

## 2021-03-09 LAB — CBC
HCT: 40.9 % (ref 39.0–52.0)
Hemoglobin: 13.7 g/dL (ref 13.0–17.0)
MCH: 31.2 pg (ref 26.0–34.0)
MCHC: 33.5 g/dL (ref 30.0–36.0)
MCV: 93.2 fL (ref 80.0–100.0)
Platelets: 288 10*3/uL (ref 150–400)
RBC: 4.39 MIL/uL (ref 4.22–5.81)
RDW: 12.4 % (ref 11.5–15.5)
WBC: 8.6 10*3/uL (ref 4.0–10.5)
nRBC: 0 % (ref 0.0–0.2)

## 2021-03-09 LAB — BASIC METABOLIC PANEL
Anion gap: 8 (ref 5–15)
BUN: 49 mg/dL — ABNORMAL HIGH (ref 8–23)
CO2: 26 mmol/L (ref 22–32)
Calcium: 9.1 mg/dL (ref 8.9–10.3)
Chloride: 103 mmol/L (ref 98–111)
Creatinine, Ser: 2.31 mg/dL — ABNORMAL HIGH (ref 0.61–1.24)
GFR, Estimated: 28 mL/min — ABNORMAL LOW (ref >60)
Glucose, Bld: 105 mg/dL — ABNORMAL HIGH (ref 70–99)
Potassium: 4.1 mmol/L (ref 3.5–5.1)
Sodium: 137 mmol/L (ref 135–145)

## 2021-03-09 LAB — TROPONIN I (HIGH SENSITIVITY)
Troponin I (High Sensitivity): 7 ng/L (ref <18)
Troponin I (High Sensitivity): 9 ng/L (ref <18)

## 2021-03-09 LAB — BRAIN NATRIURETIC PEPTIDE: B Natriuretic Peptide: 29.9 pg/mL (ref 0.0–100.0)

## 2021-03-09 MED ORDER — IPRATROPIUM-ALBUTEROL 0.5-2.5 (3) MG/3ML IN SOLN
3.0000 mL | Freq: Once | RESPIRATORY_TRACT | Status: AC
  Administered 2021-03-09: 3 mL via RESPIRATORY_TRACT
  Filled 2021-03-09: qty 3

## 2021-03-09 MED ORDER — ALBUTEROL SULFATE HFA 108 (90 BASE) MCG/ACT IN AERS: 2.0000 | INHALATION_SPRAY | RESPIRATORY_TRACT | 0 refills | Status: DC | PRN

## 2021-03-09 NOTE — Progress Notes (Signed)
Subjective:    Patient ID: Stephen White, male    DOB: March 14, 1936, 85 y.o.   MRN: [id number]  ELMAN DETTMAN is a 85 y.o. male presenting on 03/09/2021 for Blurred Vision, Dizziness, and Shortness of Breath  Patient presents for a same day appointment.  HPI   Dyspnea on exertion Acute on Chronic Dizziness "feels unbalanced" Fatigue, reduced energy  Long term history of dizziness >1 + year, has had variety of issues with balance Previously on more medications, tapered down, had side effects on benadryl as well. Has done better with dizziness improved. Now worsening acutely. Not attributed to medications or other changes.  Has seen Cardiology in past 04/2020, thought to be unlikely Cardiac based on his dyspnea and dizzy symptoms in past had work up. No ECHO on file  He has had prior head CT 03/2020 for dizziness, see result prior without acute cause.  He has not had regular issues with dyspnea until now past 1 week with more increasing dyspnea on exertion, even at rest.  He has progression of CKD-4 decline in kidney function. He has upcoming nephrologist apt in April 2022  He has not seen Neurologist.  Admits headache blurred vision but has upcoming cataract procedure Denies chest pain today Denies nausea vomiting, fever chills   Depression screen [facility] 2/9 11/17/2020 08/15/2020 08/12/2020  Decreased Interest 0 0 0  Down, Depressed, Hopeless 0 0 0  PHQ - 2 Score 0 0 0    Social History   Tobacco Use  . Smoking status: Former Smoker    Packs/day: 0.30    Years: 25.00    Pack years: 7.50    Types: Cigarettes    Quit date: 12/19/1978    Years since quitting: 42.2  . Smokeless tobacco: Former Network engineer  . Vaping Use: Never used  Substance Use Topics  . Alcohol use: Yes    Alcohol/week: 0.0 standard drinks  . Drug use: No    Review of Systems Per HPI unless specifically indicated above     Objective:    BP (!) 141/53   Pulse 73   Ht 5\' 7"  (1.702 m)    Wt 195 lb 12.8 oz (88.8 kg)   SpO2 99%   BMI 30.67 kg/m   Wt Readings from Last 3 Encounters:  03/09/21 195 lb 12.8 oz (88.8 kg)  02/17/21 191 lb 3.2 oz (86.7 kg)  11/17/20 194 lb (88 kg)    Physical Exam Vitals and nursing note reviewed.  Constitutional:      General: He is not in acute distress.    Appearance: He is well-developed. He is not diaphoretic.     Comments: Well-appearing, comfortable, cooperative  HENT:     Head: Normocephalic and atraumatic.  Eyes:     General:        Right eye: No discharge.        Left eye: No discharge.     Conjunctiva/sclera: Conjunctivae normal.  Neck:     Thyroid: No thyromegaly.  Cardiovascular:     Rate and Rhythm: Normal rate and regular rhythm.     Heart sounds: Normal heart sounds. No murmur heard.   Pulmonary:     Effort: Tachypnea and respiratory distress (increased respiratory effort) present.     Breath sounds: No wheezing or rales.  Musculoskeletal:        General: Normal range of motion.     Cervical back: Normal range of motion and neck supple.  Lymphadenopathy:  Cervical: No cervical adenopathy.  Skin:    General: Skin is warm and dry.     Findings: No erythema or rash.  Neurological:     Mental Status: He is alert and oriented to person, place, and time.  Psychiatric:        Behavior: Behavior normal.     Comments: Well groomed, good eye contact, normal speech and thoughts       Results for orders placed or performed in visit on 10/05/50  BASIC METABOLIC PANEL WITH GFR  Result Value Ref Range   Glucose, Bld 156 (H) 65 - 99 mg/dL   BUN 44 (H) 7 - 25 mg/dL   Creat 2.69 (H) 0.70 - 1.11 mg/dL   GFR, Est Non African American 21 (L) > OR = 60 mL/min/1.73m2   GFR, Est African American 24 (L) > OR = 60 mL/min/1.80m2   BUN/Creatinine Ratio 16 6 - 22 (calc)   Sodium 139 135 - 146 mmol/L   Potassium 4.4 3.5 - 5.3 mmol/L   Chloride 104 98 - 110 mmol/L   CO2 26 20 - 32 mmol/L   Calcium 9.2 8.6 - 10.3 mg/dL       Assessment & Plan:   Problem List Items Addressed This Visit    CKD (chronic kidney disease), stage IV (HCC)    Other Visit Diagnoses    Dyspnea on exertion    -  Primary   Dizziness       Balance disorder       Acute nonintractable headache, unspecified headache type          Clinically uncertain etiology acute dyspnea on exertion and at rest now worse in past 1 week In setting of acute on chronic dizziness, previously thought to be more balance / medication related, now has acute worsening Prior head CT 03/2020, no acute findings. Results on chart. Some associated headache acutely today  He has had progression of CKD-IV as well, upcoming nephrology  Today given acute dyspnea, despite hemodynamically stable vitals and pulse ox 99% on room air, I do not hear any localized abnormal lung sounds, but am concerned with his appearance and breathing status.  Also with now worse dizziness.  Discussed he needs prompt lab and imaging evaluation, should go straight to [facility] ED now, I will call them to notify triage, he can go by personal vehicle, hemodynamically stable.  I am worried his breathing could even be issue with metabolic derangement.   He would likely need a referral to Neurology (can go to [place]) in future by ED or we can refer if need  He has upcoming with Nephrology [facility] in April  No orders of the defined types were placed in this encounter.   Follow up plan: Return if symptoms worsen or fail to improve.   Nobie Putnam, [facility] 03/09/2021, 3:10 PM

## 2021-03-09 NOTE — ED Triage Notes (Addendum)
Patient to ER for c/o shortness of breath for last few weeks. Patient states he has felt generalized weakness and felt "wobbly" as well. Patient able to speak in complete sentences, but visibly short of breath on exertion. Denies current pain, but reports "once in a while" having pain to left chest.

## 2021-03-09 NOTE — ED Notes (Signed)
Patient ambulated to nursing station and stated he "is ready to leave". RN advised him that we would notify the doctor and get him going as quickly as possible. Daughter who was with patient states "Hes just ready to leave so we are going to go." Patient and daughter ambulated out of department. Doctor notified. Pt marked as elopement.

## 2021-03-09 NOTE — ED Notes (Signed)
Pt left AMA per Lovena Le, RN. No IV documented or mentioned by Lovena Le, RN.

## 2021-03-09 NOTE — ED Notes (Signed)
Ambulatory pulse ox 99%, patient reports feeling well while ambulating and denies SOB

## 2021-03-09 NOTE — ED Provider Notes (Signed)
9:35 PM Assumed care for off going team.   Blood pressure (!) 156/71, pulse 70, temperature 97.7 F (36.5 C), temperature source Oral, resp. rate 18, SpO2 99 %.  See their HPI for full report but in brief 100% sat with ambulation.   I was with a critical patient and patient left without discharge paperwork.  Or without me evaluating patient.       Vanessa Sunfield, MD 03/09/21 2135

## 2021-03-09 NOTE — Patient Instructions (Addendum)
Thank you for coming to the office today.  Please go directly to [facility] ED - for hospital evaluation. I will call them and let them know to expect you.  We will likely need to refer to [facility] AFTER your hospital evaluation, hospital can refer you or we can refer you, let me know, will need specialist for the dizziness.   Please schedule a Follow-up Appointment to: Return if symptoms worsen or fail to improve.  If you have any other questions or concerns, please feel free to call the office or send a message through [place]. You may also schedule an earlier appointment if necessary.  Additionally, you may be receiving a survey about your experience at our office within a few days to 1 week by e-mail or mail. We value your feedback.  Nobie Putnam, DO [place]

## 2021-03-09 NOTE — ED Provider Notes (Signed)
[facility] Emergency Department Provider Note  ____________________________________________   Event Date/Time   First MD Initiated Contact with Patient 03/09/21 1851     (approximate)  I have reviewed the triage vital signs and the nursing notes.   HISTORY  Chief Complaint Shortness of Breath    HPI Stephen White is a 85 y.o. male here with shortness of breath, feeling of being off balance. Pt reports that his sx have been ongoing for "a good while," several months. Pt reports that his SOB has been gradual, mild, but persistent. He feels SOB with exertion, worse when outside, and has to rest often. He denies any chest pain. No sputum production. He does have a h/o asthma but does not use inhalers. He also has felt "wobbly" when walking, like he was drunk. He says his legs "shake and given out" when he stands and that he feels like he has balance issues. He denies overt vertigo or room spinning. No focal numbness or weakness. Of note, pt has h/o diabetes with labile blood sugars, but this has been improving since he had a more continuous monitor.       Past Medical History:  Diagnosis Date  . Actinic keratosis   . Allergy   . Basal cell carcinoma 08/29/2018   L post inf ear/excision  . Basal cell carcinoma 08/05/2020   L lat forehead above the brow  . Chronic kidney disease   . GERD (gastroesophageal reflux disease)   . Hyperlipidemia   . Hypertension   . Squamous cell carcinoma of skin 03/26/2015   L dorsal hand  . Squamous cell carcinoma of skin 06/17/2015   R cheek  . Squamous cell carcinoma of skin 05/10/2019   R hand dorsum/in situ  . Squamous cell carcinoma of skin 08/05/2020   R of mideline forehead   . Thyroid disease     Patient Active Problem List   Diagnosis Date Noted  . Chronic frontal sinusitis 11/17/2020  . Flu vaccine need 10/16/2020  . Episodic lightheadedness 04/01/2020  . Type 2 diabetes mellitus with diabetic  nephropathy, with long-term current use of insulin ([place]) 02/14/2019  . Flatulence 11/03/2016  . Multiple actinic keratoses 05/05/2015  . Arthropathia 05/05/2015  . CAFL (chronic airflow limitation) ([place]) 05/05/2015  . 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 Gastro-esophageal reflux disease without esophagitis 05/05/2015  . Retinopathy 12/23/2014  . Microalbuminuria 12/23/2014  . CKD (chronic kidney disease), stage IV ([place]) 05/07/2010  . Allergic rhinitis 05/14/2008  . Benign hypertension with CKD (chronic kidney disease) stage IV ([place]) 10/18/2007  . HLD (hyperlipidemia) 09/06/2007    Past Surgical History:  Procedure Laterality Date  . EYE SURGERY      Prior to Admission medications   Medication Sig Start Date End Date Taking? Authorizing Provider  aspirin 81 MG tablet Take 81 mg by mouth daily.  02/03/11   [provider]  Carboxymethylcellulose Sodium (EYE DROPS OP) Apply to eye.     [provider]  Continuous Blood Gluc Receiver (FREESTYLE LIBRE 14 DAY READER) DEVI by Does not apply route.    [provider]  Emollient (GOLD BOND ULTIMATE) LOTN Apply topically at bedtime. Diabetic lotion    [provider]  famotidine (PEPCID) 10 MG tablet Take 1 tablet (10 mg total) by mouth daily. 02/25/21   Karamalegos, Devonne Doughty, DO  fluticasone (FLONASE) 50 MCG/ACT nasal spray Place 2 sprays into both nostrils daily. Use for 4-6 weeks then stop and use seasonally  or as needed. 11/17/20   Karamalegos, Devonne Doughty, DO  gabapentin (NEURONTIN) 300 MG capsule Take 300 mg by mouth at bedtime as needed. 04/20/20   [provider]  hydrochlorothiazide (HYDRODIURIL) 25 MG tablet Take 1 tablet (25 mg total) by mouth daily. 02/02/21   Karamalegos, Devonne Doughty, DO  insulin aspart (NOVOLOG) 100 UNIT/ML FlexPen Take NovoLog insulin 10-15 minutes BEFORE you eat.  Take NovoLog 32 units before breakfast Take NovoLog 15 units before lunch Take NovoLog 32 units before  supper.  Adjust for CBG <70 following direction from Endocrinologist 04/08/20   [provider]  insulin detemir (LEVEMIR) 100 UNIT/ML FlexPen Inject into the skin. Inject 15 Units subcutaneously nightly 10/27/20   [provider]  loratadine (CLARITIN) 10 MG tablet Take 10 mg by mouth every other day as needed for allergies.     [provider]  losartan (COZAAR) 100 MG tablet TAKE 1 TABLET(100 MG) BY MOUTH DAILY 12/18/20   Jerrol Banana., MD  MULTIPLE VITAMIN PO Take by mouth daily.  02/03/11   [provider]  ONE TOUCH ULTRA TEST test strip 1 each by Other route as needed.  09/13/15   [provider]  pravastatin (PRAVACHOL) 10 MG tablet TAKE 1 TABLET BY MOUTH EVERY DAY 01/17/21   Olin Hauser, DO    Allergies Prednisone  Family History  Problem Relation Age of Onset  . Diabetes Mother   . Diabetes Father   . Stroke Father   . Diabetes Brother   . Diabetes Sister   . Diabetes Sister   . Diabetes Brother   . Diabetes Brother   . Diabetes Brother   . Cancer Brother   . Diabetes Brother     Social History Social History   Tobacco Use  . Smoking status: Former Smoker    Packs/day: 0.30    Years: 25.00    Pack years: 7.50    Types: Cigarettes    Quit date: 12/19/1978    Years since quitting: 42.2  . Smokeless tobacco: Former Network engineer  . Vaping Use: Never used  Substance Use Topics  . Alcohol use: Yes    Alcohol/week: 0.0 standard drinks  . Drug use: No    Review of Systems  Review of Systems  Constitutional: Negative for chills and fever.  HENT: Negative for sore throat.   Respiratory: Positive for shortness of breath.   Cardiovascular: Negative for chest pain.  Gastrointestinal: Negative for abdominal pain.  Genitourinary: Negative for flank pain.  Musculoskeletal: Positive for gait problem. Negative for neck pain.  Skin: Negative for rash and wound.  Allergic/Immunologic: Negative for  immunocompromised state.  Neurological: Negative for weakness and numbness.  Hematological: Does not bruise/bleed easily.  All other systems reviewed and are negative.    ____________________________________________  PHYSICAL EXAM:      VITAL SIGNS: ED Triage Vitals [03/09/21 1613]  Enc Vitals Group     BP (!) 157/76     Pulse Rate 70     Resp 20     Temp 97.8 F (36.6 C)     Temp Source Oral     SpO2 98 %     Weight      Height      Head Circumference      Peak Flow      Pain Score 0     Pain Loc      Pain Edu?      Excl. in [place]?  Physical Exam Vitals and nursing note reviewed.  Constitutional:      General: He is not in acute distress.    Appearance: He is well-developed.  HENT:     Head: Normocephalic and atraumatic.  Eyes:     Conjunctiva/sclera: Conjunctivae normal.  Cardiovascular:     Rate and Rhythm: Normal rate and regular rhythm.     Heart sounds: Normal heart sounds. No murmur heard. No friction rub.  Pulmonary:     Effort: Pulmonary effort is normal. No respiratory distress.     Breath sounds: Wheezing (scant, expiratory only) present. No rales.  Abdominal:     General: There is no distension.     Palpations: Abdomen is soft.     Tenderness: There is no abdominal tenderness.  Musculoskeletal:     Cervical back: Neck supple.  Skin:    General: Skin is warm.     Capillary Refill: Capillary refill takes less than 2 seconds.     Findings: No rash.  Neurological:     Mental Status: He is alert and oriented to person, place, and time.     GCS: GCS eye subscore is 4. GCS verbal subscore is 5. GCS motor subscore is 6.     Cranial Nerves: Cranial nerves are intact.     Sensory: Sensation is intact.     Motor: Motor function is intact. No abnormal muscle tone.     Coordination: Coordination is intact. Romberg sign negative. Coordination normal.     Gait: Gait is intact.       ____________________________________________   LABS (all labs  ordered are listed, but only abnormal results are displayed)  Labs Reviewed  BASIC METABOLIC PANEL - Abnormal; Notable for the following components:      Result Value   Glucose, Bld 105 (*)    BUN 49 (*)    Creatinine, Ser 2.31 (*)    GFR, Estimated 28 (*)    All other components within normal limits  CBC  BRAIN NATRIURETIC PEPTIDE  TROPONIN I (HIGH SENSITIVITY)  TROPONIN I (HIGH SENSITIVITY)    ____________________________________________  EKG:  ________________________________________  RADIOLOGY All imaging, including plain films, CT scans, and ultrasounds, independently reviewed by me, and interpretations confirmed via formal radiology reads.  ED MD interpretation:   EKG: Normal sinus rhythm, VR 67. PR 184, QRS 76, QTc 401. No acute ST elevations or depressions. Minimal voltage criteria for LVH.   Official radiology report(s): DG Chest 2 View  Result Date: 03/09/2021 CLINICAL DATA:  Shortness of breath with intermittent chest pain. EXAM: CHEST - 2 VIEW.  Patient rotated. COMPARISON:  Chest x-ray 04/10/2008 FINDINGS: The heart size and mediastinal contours are within normal limits. Aortic arch calcification. Right costophrenic angle subcentimeter nodule like density. Biapical pleural/pulmonary scarring. No pulmonary edema. No pleural effusion. No pneumothorax. No acute osseous abnormality. IMPRESSION: Right costophrenic angle subcentimeter nodule like density. Finding could represent atelectasis versus nodule versus overlying soft tissues given patient rotation. Electronically Signed   By: Iven Finn M.D.   On: 03/09/2021 17:44   CT Head Wo Contrast  Result Date: 03/09/2021 CLINICAL DATA:  Dizziness, unspecified EXAM: CT HEAD WITHOUT CONTRAST TECHNIQUE: Contiguous axial images were obtained from the base of the skull through the vertex without intravenous contrast. COMPARISON:  April of 2021 FINDINGS: Brain: No evidence of acute infarction, hemorrhage, hydrocephalus,  extra-axial collection or mass lesion/mass effect. Signs of atrophy and ventricular prominence as on previous imaging. Presumed arachnoid cyst in the posterior cranial fossa in a  to the LEFT of midline unchanged. Vascular: No hyperdense vessel or unexpected calcification. Skull: Normal. Negative for fracture or focal lesion. Sinuses/Orbits: Visualized paranasal sinuses and orbits with signs of LEFT sphenoid sinus disease, otherwise negative. Other: None IMPRESSION: 1. No acute intracranial pathology. 2. Signs of atrophy and ventricular prominence as on previous imaging. 3. Presumed arachnoid cyst in the posterior cranial fossa in a to the LEFT of midline unchanged. 4. LEFT sphenoid sinus disease. Electronically Signed   By: Zetta Bills M.D.   On: 03/09/2021 20:37    ____________________________________________  PROCEDURES   Procedure(s) performed (including Critical Care):  Procedures  ____________________________________________  INITIAL IMPRESSION / MDM / [place] / ED COURSE  As part of my medical decision making, I reviewed the following data within the [facility] notes reviewed and incorporated, Old chart reviewed, Notes from prior ED visits, and [place] Controlled Substance [place] was evaluated in Emergency Department on 03/09/2021 for the symptoms described in the history of present illness. He was evaluated in the context of the global COVID-19 pandemic, which necessitated consideration that the patient might be at risk for infection with the SARS-CoV-2 virus that causes COVID-19. Institutional protocols and algorithms that pertain to the evaluation of patients at risk for COVID-19 are in a state of rapid change based on information released by regulatory bodies including the CDC and federal and state organizations. These policies and algorithms were followed during the patient's care in the ED.  Some ED evaluations and  interventions may be delayed as a result of limited staffing during the pandemic.*     Medical Decision Making:  85 yo M here with subjective SOB, ataxia for "months." Re: SOB - he has scant wheezes on exam, h/o asthma.  Query underlying reactive airway disease.  Differential is broad given his age, however.  EKG is nonischemic and troponins are negative x2, and I do not suspect ACS or anginal equivalent.  BNP normal with no lower extremity edema or signs of CHF.  He has no lower extremity pain, swelling, tachycardia, hypoxia, or evidence to suggest DVT or PE.  He has a possible nodule versus atelectasis on his chest x-ray which I feel is incidental.  No fever or signs of infection.  Given his wheezing, will trial albuterol to see if this improves his symptoms.  Suspect this can be further worked up as an outpatient with echo and continued monitoring by his primary.  Regarding his reported dizziness, this is been ongoing for months.  No evidence of acute stroke clinically.  His neurological exam is nonfocal.  Will obtain a CT head, encourage PCP follow-up for this as well. Suspect this could be related to diabetic neuropathy/balance issues, as he has a h/o poorly-controlled DM. No ataxia or dysmetria on FTN and HTS testing. Negative Romberg.   ____________________________________________  FINAL CLINICAL IMPRESSION(S) / ED DIAGNOSES  Final diagnoses:  Wheezing  Shortness of breath     MEDICATIONS GIVEN DURING THIS VISIT:  Medications  ipratropium-albuterol (DUONEB) 0.5-2.5 (3) MG/3ML nebulizer solution 3 mL (3 mLs Nebulization Given 03/09/21 1956)     ED Discharge Orders    None       Note:  This document was prepared using Dragon voice recognition software and may include unintentional dictation errors.   Duffy Bruce, MD 03/09/21 [phone]

## 2021-03-10 DIAGNOSIS — E1140 Type 2 diabetes mellitus with diabetic neuropathy, unspecified: Secondary | ICD-10-CM | POA: Insufficient documentation

## 2021-03-10 DIAGNOSIS — N184 Chronic kidney disease, stage 4 (severe): Secondary | ICD-10-CM | POA: Insufficient documentation

## 2021-03-13 ENCOUNTER — Other Ambulatory Visit: Payer: Self-pay

## 2021-03-13 ENCOUNTER — Encounter: Payer: Self-pay | Admitting: Family Medicine

## 2021-03-13 ENCOUNTER — Ambulatory Visit (INDEPENDENT_AMBULATORY_CARE_PROVIDER_SITE_OTHER): Payer: Medicare HMO | Admitting: Family Medicine

## 2021-03-13 VITALS — BP 155/66 | Ht 67.0 in | Wt 195.0 lb

## 2021-03-13 DIAGNOSIS — N184 Chronic kidney disease, stage 4 (severe): Secondary | ICD-10-CM | POA: Diagnosis not present

## 2021-03-13 DIAGNOSIS — R2689 Other abnormalities of gait and mobility: Secondary | ICD-10-CM

## 2021-03-13 DIAGNOSIS — R0600 Dyspnea, unspecified: Secondary | ICD-10-CM

## 2021-03-13 DIAGNOSIS — R0609 Other forms of dyspnea: Secondary | ICD-10-CM

## 2021-03-13 DIAGNOSIS — R42 Dizziness and giddiness: Secondary | ICD-10-CM

## 2021-03-13 NOTE — Patient Instructions (Addendum)
Thank you for coming to the office today.  Pick up the inhaler Albuterol rescue inhaler at Pharmacy - use it as needed for shortness of breath, winded, coughing, wheezing - 2 puffs every 4-6 hours.  For energy can try Vitamin B12 supplement up to 1022mcg daily over the counter and also regular Multivitamin with B vitamins, Vitamin D3 1000-2000 a day as well.  Kidney function has mildly decreased further. Keep up with the Kidney doctor's recommendations.  Please schedule a Follow-up Appointment to: Return in about 6 weeks (around 04/24/2021) for 6 weeks for follow-up reduced energy / breathing / nephro updates.  If you have any other questions or concerns, please feel free to call the office or send a message through [place]. You may also schedule an earlier appointment if necessary.  Additionally, you may be receiving a survey about your experience at our office within a few days to 1 week by e-mail or mail. We value your feedback.  Nobie Putnam, DO [place]

## 2021-03-13 NOTE — Progress Notes (Signed)
Subjective:    Patient ID: Stephen White, male    DOB: 1936-05-06, 85 y.o.   MRN: [id number]  Stephen White is a 85 y.o. male presenting on 03/13/2021 for CT f/u and Diabetes   HPI   ED FOLLOW-UP VISIT  Hospital/Location: [facility] Date of ED Visit: 03/09/21  Reason for Presenting to ED: Dyspnea Primary (+Secondary) Diagnosis: Dyspnea / Wheezing  FOLLOW-UP  - ED provider note and record have been reviewed - Patient presents today about 4 days after recent ED visit. Brief summary of recent course, patient had symptoms of dyspnea, weakness, dizziness, presented to ED on 3/21 after he was advised to go directly there from our office., testing in ED with labs, CXR, CT head, treated with albuterol and discharged . - Today reports overall has done well after discharge from ED. Symptoms of wheezing dyspnea have improved. He did not pick up rx albuterol given on discharge, was unaware of it.  He has upcoming apt with Nephrologist, as new patient.  Labs from ED has improved, still advanced CKD    Depression screen [facility] 2/9 11/17/2020 08/15/2020 08/12/2020  Decreased Interest 0 0 0  Down, Depressed, Hopeless 0 0 0  PHQ - 2 Score 0 0 0    Social History   Tobacco Use  . Smoking status: Former Smoker    Packs/day: 0.30    Years: 25.00    Pack years: 7.50    Types: Cigarettes    Quit date: 12/19/1978    Years since quitting: 42.2  . Smokeless tobacco: Former Network engineer  . Vaping Use: Never used  Substance Use Topics  . Alcohol use: Yes    Alcohol/week: 0.0 standard drinks  . Drug use: No    Review of Systems Per HPI unless specifically indicated above     Objective:    BP (!) 155/66 (BP Location: Left Arm, Patient Position: Sitting, Cuff Size: Normal)   Ht 5\' 7"  (1.702 m)   Wt 195 lb (88.5 kg)   BMI 30.54 kg/m   Wt Readings from Last 3 Encounters:  03/13/21 195 lb (88.5 kg)  03/09/21 195 lb 12.8 oz (88.8 kg)  02/17/21 191 lb 3.2 oz (86.7 kg)    Physical  Exam Vitals and nursing note reviewed.  Constitutional:      General: He is not in acute distress.    Appearance: He is well-developed. He is not diaphoretic.     Comments: Well-appearing, comfortable, cooperative  HENT:     Head: Normocephalic and atraumatic.  Eyes:     General:        Right eye: No discharge.        Left eye: No discharge.     Conjunctiva/sclera: Conjunctivae normal.  Neck:     Thyroid: No thyromegaly.  Cardiovascular:     Rate and Rhythm: Normal rate and regular rhythm.     Heart sounds: Normal heart sounds. No murmur heard.   Pulmonary:     Effort: Pulmonary effort is normal. No respiratory distress.     Breath sounds: Normal breath sounds. No wheezing or rales.     Comments: Breathing appears baseline, no inc work of breathing Musculoskeletal:        General: Normal range of motion.     Cervical back: Normal range of motion and neck supple.  Lymphadenopathy:     Cervical: No cervical adenopathy.  Skin:    General: Skin is warm and dry.     Findings: No  erythema or rash.  Neurological:     Mental Status: He is alert and oriented to person, place, and time.  Psychiatric:        Behavior: Behavior normal.     Comments: Well groomed, good eye contact, normal speech and thoughts      I have personally reviewed the radiology report from 03/09/21 on CXR.  DG Chest 2 ViewPerformed 03/09/2021 Final result  Study Result CLINICAL DATA: Shortness of breath with intermittent chest pain.  EXAM: CHEST - 2 VIEW. Patient rotated.  COMPARISON: Chest x-ray 04/10/2008  FINDINGS: The heart size and mediastinal contours are within normal limits. Aortic arch calcification.  Right costophrenic angle subcentimeter nodule like density. Biapical pleural/pulmonary scarring. No pulmonary edema. No pleural effusion. No pneumothorax.  No acute osseous abnormality.  IMPRESSION: Right costophrenic angle subcentimeter nodule like density. Finding could represent  atelectasis versus nodule versus overlying soft tissues given patient rotation.   Electronically Signed By: Iven Finn M.D. On: 03/09/2021 17:44  ------  I have personally reviewed the radiology report from 03/09/21 CT Head.  CT Head Wo ContrastPerformed 03/09/2021 Final result  Study Result CLINICAL DATA: Dizziness, unspecified  EXAM: CT HEAD WITHOUT CONTRAST  TECHNIQUE: Contiguous axial images were obtained from the base of the skull through the vertex without intravenous contrast.  COMPARISON: April of 2021  FINDINGS: Brain: No evidence of acute infarction, hemorrhage, hydrocephalus, extra-axial collection or mass lesion/mass effect. Signs of atrophy and ventricular prominence as on previous imaging. Presumed arachnoid cyst in the posterior cranial fossa in a to the LEFT of midline unchanged.  Vascular: No hyperdense vessel or unexpected calcification.  Skull: Normal. Negative for fracture or focal lesion.  Sinuses/Orbits: Visualized paranasal sinuses and orbits with signs of LEFT sphenoid sinus disease, otherwise negative.  Other: None  IMPRESSION: 1. No acute intracranial pathology. 2. Signs of atrophy and ventricular prominence as on previous imaging. 3. Presumed arachnoid cyst in the posterior cranial fossa in a to the LEFT of midline unchanged. 4. LEFT sphenoid sinus disease.   Electronically Signed By: Zetta Bills M.D. On: 03/09/2021 20:37   Results for orders placed or performed during the hospital encounter of 36/64/40  Basic metabolic panel  Result Value Ref Range   Sodium 137 135 - 145 mmol/L   Potassium 4.1 3.5 - 5.1 mmol/L   Chloride 103 98 - 111 mmol/L   CO2 26 22 - 32 mmol/L   Glucose, Bld 105 (H) 70 - 99 mg/dL   BUN 49 (H) 8 - 23 mg/dL   Creatinine, Ser 2.31 (H) 0.61 - 1.24 mg/dL   Calcium 9.1 8.9 - 10.3 mg/dL   GFR, Estimated 28 (L) >60 mL/min   Anion gap 8 5 - 15  CBC  Result Value Ref Range   WBC 8.6 4.0 - 10.5 K/uL    RBC 4.39 4.22 - 5.81 MIL/uL   Hemoglobin 13.7 13.0 - 17.0 g/dL   HCT 40.9 39.0 - 52.0 %   MCV 93.2 80.0 - 100.0 fL   MCH 31.2 26.0 - 34.0 pg   MCHC 33.5 30.0 - 36.0 g/dL   RDW 12.4 11.5 - 15.5 %   Platelets 288 150 - 400 K/uL   nRBC 0.0 0.0 - 0.2 %  Brain natriuretic peptide  Result Value Ref Range   B Natriuretic Peptide 29.9 0.0 - 100.0 pg/mL  Troponin I (High Sensitivity)  Result Value Ref Range   Troponin I (High Sensitivity) 7 <18 ng/L  Troponin I (High Sensitivity)  Result Value Ref  Range   Troponin I (High Sensitivity) 9 <18 ng/L      Assessment & Plan:   Problem List Items Addressed This Visit    CKD (chronic kidney disease), stage IV (HCC) - Primary    Other Visit Diagnoses    Balance disorder       Dyspnea on exertion       Dizziness         CKD-IV Improved on repeat chemistry recently Counseling on hydration F/u as scheduled new patient Nephrology 03/2021  Dyspnea / Wheezing Improved Advised to pick up Albuterol inhaler May warrant further pulm work up in future, consider maintenance therapy if asthma component    He may trial OTC B12 for energy - will check B12 lab in future.  No orders of the defined types were placed in this encounter.     Follow up plan: Return in about 6 weeks (around 04/24/2021) for 6 weeks for follow-up reduced energy / breathing / nephro updates.   Nobie Putnam, [facility] 03/13/2021, 1:56 PM

## 2021-03-14 NOTE — Addendum Note (Signed)
Addended by: Olin Hauser on: 03/14/2021 09:16 AM   Modules accepted: Level of Service

## 2021-03-16 ENCOUNTER — Ambulatory Visit (INDEPENDENT_AMBULATORY_CARE_PROVIDER_SITE_OTHER): Payer: Medicare HMO | Admitting: Pharmacist

## 2021-03-16 DIAGNOSIS — Z794 Long term (current) use of insulin: Secondary | ICD-10-CM

## 2021-03-16 DIAGNOSIS — I129 Hypertensive chronic kidney disease with stage 1 through stage 4 chronic kidney disease, or unspecified chronic kidney disease: Secondary | ICD-10-CM

## 2021-03-16 DIAGNOSIS — E1121 Type 2 diabetes mellitus with diabetic nephropathy: Secondary | ICD-10-CM | POA: Diagnosis not present

## 2021-03-16 DIAGNOSIS — N184 Chronic kidney disease, stage 4 (severe): Secondary | ICD-10-CM | POA: Diagnosis not present

## 2021-03-16 NOTE — Chronic Care Management (AMB) (Signed)
Chronic Care Management Pharmacy Note  03/16/2021 Name:  Stephen White MRN:  [id number] DOB:  11/22/1936  Subjective: Stephen White is an 85 y.o. year old male who is a primary patient of Olin Hauser, DO.  The CCM team was consulted for assistance with disease management and care coordination needs.    Engaged with patient by telephone for follow up visit in response to provider referral for pharmacy case management and/or care coordination services.   Consent to Services:  The patient was given information about Chronic Care Management services, agreed to services, and gave verbal consent prior to initiation of services.  Please see initial visit note for detailed documentation.   Patient Care Team: Olin Hauser, DO as PCP - General (Family Medicine) Eulogio Bear, MD as Consulting Physician (Ophthalmology) Gabriel Carina Betsey Holiday, MD as Physician Assistant (Endocrinology) Ralene Bathe, MD as Consulting Physician (Dermatology) Winfield Cunas Virl Diamond, [facility] as Pharmacist  Recent office visits: Office Visit with PCP on 3/25 for ED Follow-up Visit  ED Visits: Patient seen at [facility] ED on 3/21 for shortness of breath.  Patient started on trial of albuterol to use as needed for shortness of breath   Objective:  Lab Results  Component Value Date   CREATININE 2.31 (H) 03/09/2021   CREATININE 2.69 (H) 02/24/2021   CREATININE 2.62 (H) 09/08/2020    Lab Results  Component Value Date   HGBA1C 8.1 12/31/2020   Last diabetic Eye exam:  Lab Results  Component Value Date/Time   HMDIABEYEEXA Retinopathy (A) 01/08/2021 09:03 AM        Component Value Date/Time   CHOL 152 08/01/2020 0838   CHOL 162 11/07/2018 0958   TRIG 68 08/01/2020 0838   HDL 56 08/01/2020 0838   HDL 54 11/07/2018 0958   CHOLHDL 2.7 08/01/2020 0838   LDLCALC 82 08/01/2020 0838    Hepatic Function Latest Ref Rng & Units 09/08/2020 11/07/2018 12/05/2017  Total Protein  6.1 - 8.1 g/dL 6.6 6.6 6.4  Albumin 3.5 - 4.8 g/dL - 4.1 -  AST 10 - 35 U/L $Remo'19 16 18  'PNFzc$ ALT 9 - 46 U/L $Remo'17 17 17  'KDmym$ Alk Phosphatase 39 - 117 IU/L - 55 -  Total Bilirubin 0.2 - 1.2 mg/dL 0.5 0.4 0.6    Lab Results  Component Value Date/Time   TSH 2.37 08/01/2020 08:38 AM   TSH 2.670 11/07/2018 09:58 AM    CBC Latest Ref Rng & Units 03/09/2021 08/01/2020 11/07/2018  WBC 4.0 - 10.5 K/uL 8.6 8.2 9.3  Hemoglobin 13.0 - 17.0 g/dL 13.7 13.8 13.8  Hematocrit 39.0 - 52.0 % 40.9 42.2 41.2  Platelets 150 - 400 K/uL 288 194 278     Social History   Tobacco Use  Smoking Status Former Smoker  . Packs/day: 0.30  . Years: 25.00  . Pack years: 7.50  . Types: Cigarettes  . Quit date: 12/19/1978  . Years since quitting: 42.2  Smokeless Tobacco Former User   BP Readings from Last 3 Encounters:  03/13/21 (!) 155/66  03/09/21 (!) 156/71  03/09/21 (!) 141/53   Pulse Readings from Last 3 Encounters:  03/09/21 70  03/09/21 73  02/17/21 72   Wt Readings from Last 3 Encounters:  03/13/21 195 lb (88.5 kg)  03/09/21 195 lb 12.8 oz (88.8 kg)  02/17/21 191 lb 3.2 oz (86.7 kg)    Assessment: Review of patient past medical history, allergies, medications, health status, including review of consultants reports, laboratory  and other test data, was performed as part of comprehensive evaluation and provision of chronic care management services.   SDOH:  (Social Determinants of Health) assessments and interventions performed: none   CCM Care Plan  Allergies  Allergen Reactions  . Prednisone     Unsure reaction     Medications Reviewed Today    Reviewed by Vella Raring, [place] (Pharmacist) on 03/16/21 at 1414  Med List Status: <None>  Medication Order Taking? Sig Documenting Provider Last Dose Status Informant  albuterol (VENTOLIN HFA) 108 (90 Base) MCG/ACT inhaler [id number] Yes Inhale 2 puffs into the lungs every 4 (four) hours as needed for wheezing or shortness of breath. Duffy Bruce,  MD Taking Active   aspirin 81 MG tablet [id number]  Take 81 mg by mouth daily.  [provider]  Active            Med Note Emmaline Kluver May 05, 2015 12:22 PM) Received from: [facility] ([place] [facility]) [id number]  Apply to eye.  [provider]  Active   Continuous Blood Gluc Receiver (FREESTYLE LIBRE 2 READER) DEVI [id number] Yes by Does not apply route. [provider]  Active   Emollient (GOLD Thedore MinsSharlyne Pacas [id number]  Apply topically at bedtime. Diabetic lotion [provider]  Active   famotidine (PEPCID) 10 MG tablet [id number] Yes Take 1 tablet (10 mg total) by mouth daily. Olin Hauser, DO Taking Active   fluticasone (FLONASE) 50 MCG/ACT nasal spray [id number] Yes Place 2 sprays into both nostrils daily. Use for 4-6 weeks then stop and use seasonally or as needed. Olin Hauser, DO Taking Active   gabapentin (NEURONTIN) 300 MG capsule [id number]  Take 300 mg by mouth at bedtime as needed. [provider]  Active   hydrochlorothiazide (HYDRODIURIL) 25 MG tablet [id number] Yes Take 1 tablet (25 mg total) by mouth daily. Olin Hauser, DO Taking Active   insulin aspart (NOVOLOG) 100 UNIT/ML FlexPen [id number] Yes Take NovoLog insulin 10-15 minutes BEFORE you eat.  Take NovoLog 32 units before breakfast Take NovoLog 15 units before lunch Take NovoLog 32 units before supper.  Adjust for CBG <70 following direction from Endocrinologist [provider] Taking Active   insulin detemir (LEVEMIR) 100 UNIT/ML FlexPen [id number] Yes Inject into the skin. Inject 15 Units subcutaneously nightly [provider] Taking Active   loratadine (CLARITIN) 10 MG tablet [id number] Yes Take 10 mg by mouth every other day as needed for allergies.  [provider] Taking Active   losartan (COZAAR) 100 MG tablet [id number] Yes TAKE 1 TABLET(100 MG) BY  MOUTH DAILY Jerrol Banana., MD Taking Active   MULTIPLE VITAMIN PO [id number]  Take by mouth daily.  [provider]  Active            Med Note Emmaline Kluver May 05, 2015 12:22 PM) Received from: [facility] TEST test strip [id number]  1 each by Other route as needed.  [provider]  Active            Med Note Doristine Devoid   Fri Oct 03, 2015  9:40 AM) Received from: External Pharmacy  pravastatin (PRAVACHOL) 10 MG tablet [id number] Yes TAKE 1 TABLET BY MOUTH EVERY DAY Olin Hauser, DO Taking Active           Patient Active Problem List   Diagnosis Date Noted  .  Chronic frontal sinusitis 11/17/2020  . Flu vaccine need 10/16/2020  . Episodic lightheadedness 04/01/2020  . Type 2 diabetes mellitus with diabetic nephropathy, with long-term current use of insulin ([place]) 02/14/2019  . Flatulence 11/03/2016  . Multiple actinic keratoses 05/05/2015  . Arthropathia 05/05/2015  . CAFL (chronic airflow limitation) ([place]) 05/05/2015  . 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 Gastro-esophageal reflux disease without esophagitis 05/05/2015  . Retinopathy 12/23/2014  . Microalbuminuria 12/23/2014  . CKD (chronic kidney disease), stage IV ([place]) 05/07/2010  . Allergic rhinitis 05/14/2008  . Benign hypertension with CKD (chronic kidney disease) stage IV ([place]) 10/18/2007  . HLD (hyperlipidemia) 09/06/2007    Immunization History  Administered Date(s) Administered  . Fluad Quad(high Dose 65+) 11/02/2019, 10/16/2020  . Influenza, High Dose Seasonal PF 10/03/2015, 11/03/2016, 10/06/2017, 11/06/2018  . PFIZER(Purple Top)SARS-COV-2 Vaccination 01/14/2020, 02/04/2020  . Pneumococcal Conjugate-13 12/24/2014  . Pneumococcal Polysaccharide-23 12/25/2012  . Td 07/02/2015  . Zoster 12/25/2012    Conditions to be addressed/monitored: HTN, HLD, DMII and CKD  Care Plan : PharmD - Medication Managment and  Assistance  Updates made by Vella Raring, RPH since 03/16/2021 12:00 AM    Problem: Disease Progression     Long-Range Goal: Disease Progression Prevented or Minimized   Start Date: 02/02/2021  Expected End Date: 05/03/2021  Recent Progress: On track  Priority: High  Note:   Current Barriers:  . Financial Barriers in complicated patient with multiple medical conditions including T2DM, CKD, GERD, HTN, HLD; patient has [employer] and reports copay for [place] is cost prohibitive at this time o Approved to receive Novolog and Levemir from [employer] patient assistance program through 12/19/2021  Pharmacist Clinical Goal(s):  Marland Kitchen Over the next 90 days, patient will achieve adherence to monitoring guidelines and medication adherence to achieve therapeutic efficacy through collaboration with PharmD and provider.   Interventions: . 1:1 collaboration with Olin Hauser, DO regarding development and update of comprehensive plan of care as evidenced by provider attestation and co-signature . Inter-disciplinary care team collaboration (see longitudinal plan of care) . Perform chart review o Patient seen at [facility] ED on 3/21 for shortness of breath. Patient advised to start trial of albuterol to use as needed for shortness of breath o Patient seen for Office Visit with PCP on 3/25 for ED Follow-up Visit. Provider advised patient: - Pick up albuterol inhaler from pharmacy - May trial OTC B12 for energy  Shortness of breath: . Today when reach patient by phone he sounds and patient admits to being short of breath . Reports picked up albuterol inhaler from the pharmacy, but denies having used inhaler today . Counsel patient on administration of albuterol inhaler . Advise patient to use 2 puffs from inhaler now as directed . CM Pharmacist and patient notice patient's breathing significantly improves during telephone call, following albuterol  use . Encourage patient to continue to use albuterol inhaler as directed as needed for shortness of breath or wheezing  Seasonal allergies: . Current treatment: o Loratadine 10 mg every other day o Flonase 2 sprays into both nostrils daily as needed for seasonal allergies o Encourage patient to stay hydrated   Type 2 Diabetes o Patient followed by [facility] Endocrinology for management o Uncontrolled; current treatment:  Levemir: 15 units nightly  NovoLog: 32 units before breakfast 15 units before lunch  32 units before supper ? Patient verbalizes understanding that if blood glucose <70 before meal, take 1/2 scheduled dose for Novolog, as  directed by Endocrinologist ? Reports adherence to insulin regimen from Endocrinologist ? Again counsel on importance of administering Novolog doses 10 minutes prior to meals ? Reports using FreeStyle Libre 2 continuous glucose monitor ? Orders sensor refills from [employer] ? Target goal range: 80-180 mg/dL ? Reports alarms set to go off for readings outside of range: 70-240 mg/dL ? Reports recent blood sugar readings staying primarily within 70-240 mg/dL range ? Have counseled on importance of having regular balanced meals and reviewing nutrition labels for carbohydrate content ? Again counsel on importance of having regular and balanced meals throughtout each day, limiting carbohydrate portion sizes ? Have counsel patient on importance of calling Endocrinology as needed for concerns related to his blood sugar  Hypertension . Current regimen: o HCTZ 25 mg daily o Losartan 100 mg daily . Denies monitoring home BP recently . Counsel patient to monitor home BP monitoring, keep log and have record for [place] to review during next appointment  Hyperlipidemia . Current regimen: pravastatin 10 mg daily . Have counseled on importance of reviewing nutrition labels for cholesterol content.  Patient Goals/Self-Care Activities . Over the  next 90 days, patient will:  - take medications as prescribed using weekly pillbox - monitor blood glucose as directed by Endocrinologist  Note patient using FreeStyle Libre 2 continuous glucose monitor - check blood pressure, document, and provide at future appointments - attend medical appointments as scheduled   Initial appointment with Nephrology on 4/7 Surgery to remove cataract from left eye on 4/15 Next appointment with Endocrinology on 4/25 (lab work on 4/18)  Follow Up Plan: Telephone follow up appointment with care management team member scheduled for: 04/15/2021 12:30 PM      Medication Assistance:Approved to receive Novolog and Levemir from [employer] patient assistance program through 12/19/2021  Patient's preferred pharmacy is:  [facility] [place], [place] [place] [address] Phone: [phone] Fax: [place], [place] [address] [place] [address] Phone: [phone] Fax: [place], [place] [address] [place] [address] Phone: [phone] Fax: [phone]  Uses pill box? Yes  Follow Up:  Patient agrees to Care Plan and Follow-up.  Plan: Telephone follow up appointment with care management team member scheduled for:  04/15/2021 at 12:30 PM  Harlow Asa, PharmD, [place], [place] [phone]

## 2021-03-16 NOTE — Patient Instructions (Signed)
Visit Information  PATIENT GOALS: Goals Addressed            This Visit's Progress   . Pharmacy Goals       Our goal A1c is less than 7%. This corresponds with fasting sugars less than 130 and 2 hour after meal sugars less than 180. Please continue to monitor home blood sugar.  Our goal bad cholesterol, or LDL, is less than 70 . This is why it is important to continue taking your pravastatin.  Please check your home blood pressure, keep a log of the results and bring this with you to your medical appointments.  Feel free to call me with any questions or concerns. I look forward to our next call!  Harlow Asa, PharmD, [place], CPP Clinical Pharmacist [facility] [phone]       The patient verbalized understanding of instructions, educational materials, and care plan provided today and declined offer to receive copy of patient instructions, educational materials, and care plan.   Telephone follow up appointment with care management team member scheduled for:  04/15/2021 at 12:30 PM

## 2021-03-26 ENCOUNTER — Other Ambulatory Visit (HOSPITAL_COMMUNITY): Payer: Self-pay | Admitting: Nephrology

## 2021-03-26 ENCOUNTER — Other Ambulatory Visit: Payer: Self-pay | Admitting: Nephrology

## 2021-03-26 DIAGNOSIS — E1122 Type 2 diabetes mellitus with diabetic chronic kidney disease: Secondary | ICD-10-CM

## 2021-03-26 DIAGNOSIS — N184 Chronic kidney disease, stage 4 (severe): Secondary | ICD-10-CM

## 2021-03-26 DIAGNOSIS — R8290 Unspecified abnormal findings in urine: Secondary | ICD-10-CM

## 2021-03-30 ENCOUNTER — Encounter: Payer: Self-pay | Admitting: Ophthalmology

## 2021-03-30 ENCOUNTER — Other Ambulatory Visit: Payer: Self-pay

## 2021-04-01 NOTE — Discharge Instructions (Signed)

## 2021-04-03 ENCOUNTER — Ambulatory Visit
Admission: RE | Admit: 2021-04-03 | Discharge: 2021-04-03 | Disposition: A | Discharge status: Home / Self Care | Payer: Medicare HMO | Attending: Ophthalmology | Admitting: Ophthalmology

## 2021-04-03 ENCOUNTER — Ambulatory Visit: Payer: Medicare HMO | Admitting: Anesthesiology

## 2021-04-03 ENCOUNTER — Other Ambulatory Visit: Payer: Self-pay

## 2021-04-03 ENCOUNTER — Encounter: Payer: Self-pay | Admitting: Ophthalmology

## 2021-04-03 ENCOUNTER — Encounter
Admission: RE | Disposition: A | Discharge status: Home / Self Care | Payer: Self-pay | Source: Home / Self Care | Attending: Ophthalmology

## 2021-04-03 DIAGNOSIS — Z888 Allergy status to other drugs, medicaments and biological substances status: Secondary | ICD-10-CM | POA: Diagnosis not present

## 2021-04-03 DIAGNOSIS — Z794 Long term (current) use of insulin: Secondary | ICD-10-CM | POA: Diagnosis not present

## 2021-04-03 DIAGNOSIS — H2512 Age-related nuclear cataract, left eye: Secondary | ICD-10-CM | POA: Insufficient documentation

## 2021-04-03 DIAGNOSIS — E1136 Type 2 diabetes mellitus with diabetic cataract: Secondary | ICD-10-CM | POA: Diagnosis not present

## 2021-04-03 DIAGNOSIS — Z833 Family history of diabetes mellitus: Secondary | ICD-10-CM | POA: Diagnosis not present

## 2021-04-03 DIAGNOSIS — Z7982 Long term (current) use of aspirin: Secondary | ICD-10-CM | POA: Insufficient documentation

## 2021-04-03 DIAGNOSIS — Z79899 Other long term (current) drug therapy: Secondary | ICD-10-CM | POA: Insufficient documentation

## 2021-04-03 DIAGNOSIS — Z87891 Personal history of nicotine dependence: Secondary | ICD-10-CM | POA: Diagnosis not present

## 2021-04-03 HISTORY — DX: Type 2 diabetes mellitus without complications: E11.9

## 2021-04-03 HISTORY — PX: CATARACT EXTRACTION W/PHACO: SHX586

## 2021-04-03 HISTORY — DX: Dizziness and giddiness: R42

## 2021-04-03 HISTORY — DX: Presence of external hearing-aid: Z97.4

## 2021-04-03 LAB — GLUCOSE, CAPILLARY: Glucose-Capillary: 104 mg/dL — ABNORMAL HIGH (ref 70–99)

## 2021-04-03 SURGERY — PHACOEMULSIFICATION, CATARACT, WITH IOL INSERTION
Anesthesia: Monitor Anesthesia Care | Site: Eye | Laterality: Left

## 2021-04-03 MED ORDER — CEFUROXIME OPHTHALMIC INJECTION 1 MG/0.1 ML
INJECTION | OPHTHALMIC | Status: DC | PRN
  Administered 2021-04-03: 0.1 mL via INTRACAMERAL

## 2021-04-03 MED ORDER — BRIMONIDINE TARTRATE-TIMOLOL 0.2-0.5 % OP SOLN
OPHTHALMIC | Status: DC | PRN
  Administered 2021-04-03: 1 [drp] via OPHTHALMIC

## 2021-04-03 MED ORDER — LIDOCAINE HCL (PF) 2 % IJ SOLN
INTRAOCULAR | Status: DC | PRN
  Administered 2021-04-03: 1 mL

## 2021-04-03 MED ORDER — NEOMYCIN-POLYMYXIN-DEXAMETH 3.5-10000-0.1 OP OINT
TOPICAL_OINTMENT | OPHTHALMIC | Status: DC | PRN
  Administered 2021-04-03: 1 via OPHTHALMIC

## 2021-04-03 MED ORDER — FENTANYL CITRATE (PF) 100 MCG/2ML IJ SOLN
INTRAMUSCULAR | Status: DC | PRN
  Administered 2021-04-03: 50 ug via INTRAVENOUS

## 2021-04-03 MED ORDER — NA HYALUR & NA CHOND-NA HYALUR 0.4-0.35 ML IO KIT
PACK | INTRAOCULAR | Status: DC | PRN
  Administered 2021-04-03: 1 mL via INTRAOCULAR

## 2021-04-03 MED ORDER — ARMC OPHTHALMIC DILATING DROPS
1.0000 "application " | OPHTHALMIC | Status: DC | PRN
  Administered 2021-04-03 (×3): 1 via OPHTHALMIC

## 2021-04-03 MED ORDER — MIDAZOLAM HCL 2 MG/2ML IJ SOLN
INTRAMUSCULAR | Status: DC | PRN
  Administered 2021-04-03: 1 mg via INTRAVENOUS

## 2021-04-03 MED ORDER — TETRACAINE HCL 0.5 % OP SOLN
1.0000 [drp] | OPHTHALMIC | Status: DC | PRN
  Administered 2021-04-03 (×3): 1 [drp] via OPHTHALMIC

## 2021-04-03 MED ORDER — EPINEPHRINE PF 1 MG/ML IJ SOLN
INTRAOCULAR | Status: DC | PRN
  Administered 2021-04-03: 82 mL via OPHTHALMIC

## 2021-04-03 SURGICAL SUPPLY — 18 items
CANNULA ANT/CHMB 27GA (MISCELLANEOUS) ×2 IMPLANT
GLOVE SURG TRIUMPH 8.0 PF LTX (GLOVE) ×2 IMPLANT
GOWN STRL REUS W/ TWL LRG LVL3 (GOWN DISPOSABLE) ×2 IMPLANT
GOWN STRL REUS W/TWL LRG LVL3 (GOWN DISPOSABLE) ×4
LENS IOL ACRSF IQ ULTRA 23.0 (Intraocular Lens) ×1 IMPLANT
LENS IOL ACRYSOF IQ 23.0 (Intraocular Lens) ×2 IMPLANT
MARKER SKIN DUAL TIP RULER LAB (MISCELLANEOUS) ×2 IMPLANT
NEEDLE CAPSULORHEX 25GA (NEEDLE) ×2 IMPLANT
NEEDLE FILTER BLUNT 18X 1/2SAF (NEEDLE) ×2
NEEDLE FILTER BLUNT 18X1 1/2 (NEEDLE) ×2 IMPLANT
PACK CATARACT BRASINGTON (MISCELLANEOUS) ×2 IMPLANT
PACK EYE AFTER SURG (MISCELLANEOUS) ×2 IMPLANT
PACK OPTHALMIC (MISCELLANEOUS) ×2 IMPLANT
SOLUTION OPHTHALMIC SALT (MISCELLANEOUS) ×2 IMPLANT
SYR 3ML LL SCALE MARK (SYRINGE) ×4 IMPLANT
SYR TB 1ML LUER SLIP (SYRINGE) ×2 IMPLANT
WATER STERILE IRR 250ML POUR (IV SOLUTION) ×2 IMPLANT
WIPE NON LINTING 3.25X3.25 (MISCELLANEOUS) ×2 IMPLANT

## 2021-04-03 NOTE — Anesthesia Procedure Notes (Signed)
Procedure Name: MAC Performed by: Kathey Simer, CRNA Pre-anesthesia Checklist: Patient identified, Emergency Drugs available, Suction available, Timeout performed and Patient being monitored Patient Re-evaluated:Patient Re-evaluated prior to induction Oxygen Delivery Method: Nasal cannula Placement Confirmation: positive ETCO2       

## 2021-04-03 NOTE — Anesthesia Preprocedure Evaluation (Signed)
Anesthesia Evaluation  Patient identified by MRN, date of birth, ID band Patient awake    History of Anesthesia Complications Negative for: history of anesthetic complications  Airway Mallampati: II  TM Distance: >3 FB Neck ROM: Full    Dental no notable dental hx.    Pulmonary former smoker,    Pulmonary exam normal        Cardiovascular Exercise Tolerance: Good hypertension, Pt. on medications Normal cardiovascular exam     Neuro/Psych negative neurological ROS     GI/Hepatic negative GI ROS, Neg liver ROS,   Endo/Other  diabetes, Insulin Dependent  Renal/GU Renal disease (stage 4 CKD)     Musculoskeletal   Abdominal   Peds  Hematology   Anesthesia Other Findings   Reproductive/Obstetrics                             Anesthesia Physical Anesthesia Plan  ASA: III  Anesthesia Plan: MAC   Post-op Pain Management:    Induction:   PONV Risk Score and Plan: 1 and TIVA, Midazolam and Treatment may vary due to age or medical condition  Airway Management Planned: Nasal Cannula and Natural Airway  Additional Equipment: None  Intra-op Plan:   Post-operative Plan:   Informed Consent: I have reviewed the patients History and Physical, chart, labs and discussed the procedure including the risks, benefits and alternatives for the proposed anesthesia with the patient or authorized representative who has indicated his/her understanding and acceptance.       Plan Discussed with: CRNA  Anesthesia Plan Comments:         Anesthesia Quick Evaluation

## 2021-04-03 NOTE — Op Note (Signed)
OPERATIVE NOTE  Stephen White [id number] 04/03/2021   PREOPERATIVE DIAGNOSIS:  Nuclear sclerotic cataract left eye. H25.12   POSTOPERATIVE DIAGNOSIS:    Nuclear sclerotic cataract left eye.     PROCEDURE:  Phacoemusification with posterior chamber intraocular lens placement of the left eye  Ultrasound time: Procedure(s) with comments: CATARACT EXTRACTION PHACO AND INTRAOCULAR LENS PLACEMENT (IOC) LEFT DIABETIC 17.22 01:53.3 15.2% (Left) - Diabetic - insulin  LENS:   Implant Name Type Inv. Item Serial No. Manufacturer Lot No. LRB No. Used Action  LENS IOL ACRYSOF IQ 23.0 - [id number] Intraocular Lens LENS IOL ACRYSOF IQ 23.0 [id number] ALCON  Left 1 Implanted      SURGEON:  Wyonia Hough, MD   ANESTHESIA:  Topical with tetracaine drops and 2% Xylocaine jelly, augmented with 1% preservative-free intracameral lidocaine.    COMPLICATIONS:  None.   DESCRIPTION OF PROCEDURE:  The patient was identified in the holding room and transported to the operating room and placed in the supine position under the operating microscope.  The left eye was identified as the operative eye and it was prepped and draped in the usual sterile ophthalmic fashion.   A 1 millimeter clear-corneal paracentesis was made at the 1:30 position.  0.5 ml of preservative-free 1% lidocaine was injected into the anterior chamber.  The anterior chamber was filled with Viscoat viscoelastic.  A 2.4 millimeter keratome was used to make a near-clear corneal incision at the 10:30 position.  .  A curvilinear capsulorrhexis was made with a cystotome and capsulorrhexis forceps.  Balanced salt solution was used to hydrodissect and hydrodelineate the nucleus.   Phacoemulsification was then used in stop and chop fashion to remove the lens nucleus and epinucleus.  The remaining cortex was then removed using the irrigation and aspiration handpiece. Provisc was then placed into the capsular bag to distend it for lens placement.   A lens was then injected into the capsular bag.  The remaining viscoelastic was aspirated.   Wounds were hydrated with balanced salt solution.  The anterior chamber was inflated to a physiologic pressure with balanced salt solution.  No wound leaks were noted. Cefuroxime 0.1 ml of a 10mg /ml solution was injected into the anterior chamber for a dose of 1 mg of intracameral antibiotic at the completion of the case.   Timolol and Brimonidine drops were applied to the eye.  The patient was taken to the recovery room in stable condition without complications of anesthesia or surgery.  Stephen White 04/03/2021, 9:09 AM

## 2021-04-03 NOTE — Anesthesia Postprocedure Evaluation (Addendum)
Anesthesia Post Note  Patient: Stephen White  Procedure(s) Performed: CATARACT EXTRACTION PHACO AND INTRAOCULAR LENS PLACEMENT (IOC) LEFT DIABETIC 17.22 01:53.3 15.2% (Left Eye)     Patient location during evaluation: PACU Anesthesia Type: MAC Level of consciousness: awake and alert Pain management: pain level controlled Vital Signs Assessment: post-procedure vital signs reviewed and stable Respiratory status: spontaneous breathing Cardiovascular status: blood pressure returned to baseline Postop Assessment: no apparent nausea or vomiting, adequate PO intake and no headache Anesthetic complications: no   No complications documented.  Adele Barthel Tobiah Celestine

## 2021-04-03 NOTE — Transfer of Care (Signed)
Immediate Anesthesia Transfer of Care Note  Patient: Stephen White  Procedure(s) Performed: CATARACT EXTRACTION PHACO AND INTRAOCULAR LENS PLACEMENT (IOC) LEFT DIABETIC 17.22 01:53.3 15.2% (Left Eye)  Patient Location: PACU  Anesthesia Type: MAC  Level of Consciousness: awake, alert  and patient cooperative  Airway and Oxygen Therapy: Patient Spontanous Breathing and Patient connected to supplemental oxygen  Post-op Assessment: Post-op Vital signs reviewed, Patient's Cardiovascular Status Stable, Respiratory Function Stable, Patent Airway and No signs of Nausea or vomiting  Post-op Vital Signs: Reviewed and stable  Complications: No complications documented.

## 2021-04-03 NOTE — H&P (Signed)
[facility]   Primary Care Physician:  Olin Hauser, DO Ophthalmologist: Dr. Leandrew Koyanagi  Pre-Procedure History & Physical: HPI:  Stephen White is a 85 y.o. male here for ophthalmic surgery.   Past Medical History:  Diagnosis Date  . Actinic keratosis   . Allergy   . Basal cell carcinoma 08/29/2018   L post inf ear/excision  . Basal cell carcinoma 08/05/2020   L lat forehead above the brow  . Chronic kidney disease   . GERD (gastroesophageal reflux disease)   . Hyperlipidemia   . Hypertension   . Squamous cell carcinoma of skin 03/26/2015   L dorsal hand  . Squamous cell carcinoma of skin 06/17/2015   R cheek  . Squamous cell carcinoma of skin 05/10/2019   R hand dorsum/in situ  . Squamous cell carcinoma of skin 08/05/2020   R of mideline forehead   . Thyroid disease   . Type 2 diabetes mellitus ([place])   . Vertigo   . Wears hearing aid in both ears     Past Surgical History:  Procedure Laterality Date  . EYE SURGERY      Prior to Admission medications   Medication Sig Start Date End Date Taking? Authorizing Provider  albuterol (VENTOLIN HFA) 108 (90 Base) MCG/ACT inhaler Inhale 2 puffs into the lungs every 4 (four) hours as needed for wheezing or shortness of breath. 03/09/21  Yes Duffy Bruce, MD  aspirin 81 MG tablet Take 81 mg by mouth daily.  02/03/11  Yes [provider]  Carboxymethylcellulose Sodium (EYE DROPS OP) Apply to eye.    Yes [provider]  Continuous Blood Gluc Receiver (FREESTYLE LIBRE 2 READER) DEVI by Does not apply route.   Yes [provider]  Emollient (GOLD BOND ULTIMATE) LOTN Apply topically at bedtime. Diabetic lotion   Yes [provider]  famotidine (PEPCID) 10 MG tablet Take 1 tablet (10 mg total) by mouth daily. 02/25/21  Yes Karamalegos, Devonne Doughty, DO  fluticasone (FLONASE) 50 MCG/ACT nasal spray Place 2 sprays into both nostrils daily. Use for 4-6 weeks then stop and use  seasonally or as needed. 11/17/20  Yes Karamalegos, Devonne Doughty, DO  gabapentin (NEURONTIN) 300 MG capsule Take 300 mg by mouth at bedtime as needed. 04/20/20  Yes [provider]  hydrochlorothiazide (HYDRODIURIL) 25 MG tablet Take 1 tablet (25 mg total) by mouth daily. 02/02/21  Yes Karamalegos, Devonne Doughty, DO  insulin aspart (NOVOLOG) 100 UNIT/ML FlexPen Take NovoLog insulin 10-15 minutes BEFORE you eat.  Take NovoLog 32 units before breakfast Take NovoLog 15 units before lunch Take NovoLog 32 units before supper.  Adjust for CBG <70 following direction from Endocrinologist 04/08/20  Yes [provider]  insulin detemir (LEVEMIR) 100 UNIT/ML FlexPen Inject into the skin. Inject 15 Units subcutaneously nightly 10/27/20  Yes [provider]  loratadine (CLARITIN) 10 MG tablet Take 10 mg by mouth every other day as needed for allergies.    Yes [provider]  losartan (COZAAR) 100 MG tablet TAKE 1 TABLET(100 MG) BY MOUTH DAILY 12/18/20  Yes Jerrol Banana., MD  MULTIPLE VITAMIN PO Take by mouth daily.  02/03/11  Yes [provider]  ONE TOUCH ULTRA TEST test strip 1 each by Other route as needed.  09/13/15  Yes [provider]  pravastatin (PRAVACHOL) 10 MG tablet TAKE 1 TABLET BY MOUTH EVERY DAY 01/17/21  Yes Olin Hauser, DO    Allergies as of 02/16/2021 -  Review Complete 11/17/2020  Allergen Reaction Noted  . Prednisone  12/18/2019    Family History  Problem Relation Age of Onset  . Diabetes Mother   . Diabetes Father   . Stroke Father   . Diabetes Brother   . Diabetes Sister   . Diabetes Sister   . Diabetes Brother   . Diabetes Brother   . Diabetes Brother   . Cancer Brother   . Diabetes Brother     Social History   Socioeconomic History  . Marital status: Married    Spouse name: Not on file  . Number of children: 1  . Years of education: Not on file  . Highest education level: 12th grade  Occupational  History  . Occupation: retired  Tobacco Use  . Smoking status: Former Smoker    Packs/day: 0.30    Years: 25.00    Pack years: 7.50    Types: Cigarettes    Quit date: 12/19/1978    Years since quitting: 42.3  . Smokeless tobacco: Former Network engineer  . Vaping Use: Never used  Substance and Sexual Activity  . Alcohol use: Yes    Alcohol/week: 0.0 standard drinks  . Drug use: No  . Sexual activity: Not on file  Other Topics Concern  . Not on file  Social History Narrative  . Not on file   Social Determinants of Health   Financial Resource Strain: Low Risk   . Difficulty of Paying Living Expenses: Not hard at all  Food Insecurity: No Food Insecurity  . Worried About Charity fundraiser in the Last Year: Never true  . Ran Out of Food in the Last Year: Never true  Transportation Needs: No Transportation Needs  . Lack of Transportation (Medical): No  . Lack of Transportation (Non-Medical): No  Physical Activity: Inactive  . Days of Exercise per Week: 0 days  . Minutes of Exercise per Session: 0 min  Stress: No Stress Concern Present  . Feeling of Stress : Not at all  Social Connections: Not on file  Intimate Partner Violence: Not on file    Review of Systems: See HPI, otherwise negative ROS  Physical Exam: Ht 5\' 7"  (1.702 m)   Wt 83.9 kg   BMI 28.98 kg/m  General:   Alert,  pleasant and cooperative in NAD Head:  Normocephalic and atraumatic. Lungs:  Clear to auscultation.    Heart:  Regular rate and rhythm.   Impression/Plan: Stephen White is here for ophthalmic surgery.  Risks, benefits, limitations, and alternatives regarding ophthalmic surgery have been reviewed with the patient.  Questions have been answered.  All parties agreeable.   Leandrew Koyanagi, MD  04/03/2021, 8:35 AM

## 2021-04-06 ENCOUNTER — Encounter: Payer: Self-pay | Admitting: Ophthalmology

## 2021-04-12 ENCOUNTER — Other Ambulatory Visit: Payer: Self-pay | Admitting: Family Medicine

## 2021-04-12 NOTE — Telephone Encounter (Signed)
Requested Prescriptions  Pending Prescriptions Disp Refills  . losartan (COZAAR) 100 MG tablet [Pharmacy Med Name: LOSARTAN 100MG  TABLETS] 90 tablet 0    Sig: TAKE 1 TABLET(100 MG) BY MOUTH DAILY     Cardiovascular:  Angiotensin Receptor Blockers Failed - 04/12/2021  3:20 PM      Failed - Cr in normal range and within 180 days    Creat  Date Value Ref Range Status  02/24/2021 2.69 (H) 0.70 - 1.11 mg/dL Final    Comment:    For patients >83 years of age, the reference limit for Creatinine is approximately 13% higher for people identified as African-American. .    Creatinine, Ser  Date Value Ref Range Status  03/09/2021 2.31 (H) 0.61 - 1.24 mg/dL Final         Passed - K in normal range and within 180 days    Potassium  Date Value Ref Range Status  03/09/2021 4.1 3.5 - 5.1 mmol/L Final         Passed - Patient is not pregnant      Passed - Last BP in normal range    BP Readings from Last 1 Encounters:  04/03/21 138/67         Passed - Valid encounter within last 6 months    Recent Outpatient Visits          1 month ago CKD (chronic kidney disease), stage IV [facility])   [place], DO   1 month ago Dyspnea on exertion   [place], Devonne Doughty, DO   1 month ago Type 2 diabetes mellitus treated with insulin [facility])   [place], DO   4 months ago Type 2 diabetes mellitus treated with insulin [facility])   [place], DO   5 months ago Flu vaccine need   [facility], Lupita Raider, FNP      Future Appointments            In 1 week Ralene Bathe, MD [place]   In 1 week [facility], [place]   In 4 months [place], Devonne Doughty, [facility], [facility]

## 2021-04-15 ENCOUNTER — Telehealth: Payer: Self-pay

## 2021-04-20 ENCOUNTER — Ambulatory Visit: Payer: Medicare HMO | Admitting: Dermatology

## 2021-04-20 ENCOUNTER — Encounter: Payer: Self-pay | Admitting: Dermatology

## 2021-04-20 ENCOUNTER — Other Ambulatory Visit: Payer: Self-pay

## 2021-04-20 DIAGNOSIS — Z86018 Personal history of other benign neoplasm: Secondary | ICD-10-CM | POA: Diagnosis not present

## 2021-04-20 DIAGNOSIS — Z85828 Personal history of other malignant neoplasm of skin: Secondary | ICD-10-CM | POA: Diagnosis not present

## 2021-04-20 DIAGNOSIS — L820 Inflamed seborrheic keratosis: Secondary | ICD-10-CM

## 2021-04-20 DIAGNOSIS — L821 Other seborrheic keratosis: Secondary | ICD-10-CM | POA: Diagnosis not present

## 2021-04-20 DIAGNOSIS — L578 Other skin changes due to chronic exposure to nonionizing radiation: Secondary | ICD-10-CM

## 2021-04-20 DIAGNOSIS — L570 Actinic keratosis: Secondary | ICD-10-CM

## 2021-04-20 DIAGNOSIS — E119 Type 2 diabetes mellitus without complications: Secondary | ICD-10-CM

## 2021-04-20 NOTE — Progress Notes (Signed)
Follow-Up Visit   Subjective  Stephen White is a 85 y.o. male who presents for the following: Actinic Keratosis (Follow up of face and ears - treated with LN2 x 18 07/2020) and Other.  The following portions of the chart were reviewed this encounter and updated as appropriate:   Tobacco  Allergies  Meds  Problems  Med Hx  Surg Hx  Fam Hx     Review of Systems:  No other skin or systemic complaints except as noted in HPI or Assessment and Plan.  Objective  Well appearing patient in no apparent distress; mood and affect are within normal limits.  A focused examination was performed including face. Relevant physical exam findings are noted in the Assessment and Plan.  Objective  face (20): Hyperkeratotic pink papules  Objective  Right Hand - Posterior (3): Erythematous keratotic or waxy stuck-on papule or plaque.    Assessment & Plan    Actinic Damage - chronic, secondary to cumulative UV radiation exposure/sun exposure over time - diffuse scaly erythematous macules with underlying dyspigmentation - Recommend daily broad spectrum sunscreen SPF 30+ to sun-exposed areas, reapply every 2 hours as needed.  - Recommend staying in the shade or wearing long sleeves, sun glasses (UVA+UVB protection) and wide brim hats (4-inch brim around the entire circumference of the hat). - Call for new or changing lesions.  Seborrheic Keratoses - Stuck-on, waxy, tan-brown papules and/or plaques  - Benign-appearing - Discussed benign etiology and prognosis. - Observe - Call for any changes   AK (actinic keratosis) (20) face  Hypertrophic Aks  Actinic Damage - Severe, confluent actinic changes with pre-cancerous actinic keratoses  - Severe, chronic, not at goal, secondary to cumulative UV radiation exposure over time - diffuse scaly erythematous macules and papules with underlying dyspigmentation - Discussed Prescription "Field Treatment" for Severe, Chronic Confluent Actinic Changes  with Pre-Cancerous Actinic Keratoses Field treatment involves treatment of an entire area of skin that has confluent Actinic Changes (Sun/ Ultraviolet light damage) and PreCancerous Actinic Keratoses by method of PhotoDynamic Therapy (PDT) and/or prescription Topical Chemotherapy agents such as 5-fluorouracil, 5-fluorouracil/calcipotriene, and/or imiquimod.  The purpose is to decrease the number of clinically evident and subclinical PreCancerous lesions to prevent progression to development of skin cancer by chemically destroying early precancer changes that may or may not be visible.  It has been shown to reduce the risk of developing skin cancer in the treated area. As a result of treatment, redness, scaling, crusting, and open sores may occur during treatment course. One or more than one of these methods may be used and may have to be used several times to control, suppress and eliminate the PreCancerous changes. Discussed treatment course, expected reaction, and possible side effects. - Recommend daily broad spectrum sunscreen SPF 30+ to sun-exposed areas, reapply every 2 hours as needed.  - Staying in the shade or wearing long sleeves, sun glasses (UVA+UVB protection) and wide brim hats (4-inch brim around the entire circumference of the hat) are also recommended. - Call for new or changing lesions.   Destruction of lesion - face Complexity: simple   Destruction method: cryotherapy   Informed consent: discussed and consent obtained   Timeout:  patient name, date of birth, surgical site, and procedure verified Lesion destroyed using liquid nitrogen: Yes   Region frozen until ice ball extended beyond lesion: Yes   Outcome: patient tolerated procedure well with no complications   Post-procedure details: wound care instructions given    Inflamed seborrheic keratosis (  3) Right Hand - Posterior  Destruction of lesion - Right Hand - Posterior Complexity: simple   Destruction method: cryotherapy    Informed consent: discussed and consent obtained   Timeout:  patient name, date of birth, surgical site, and procedure verified Lesion destroyed using liquid nitrogen: Yes   Region frozen until ice ball extended beyond lesion: Yes   Outcome: patient tolerated procedure well with no complications   Post-procedure details: wound care instructions given    Return for PDT to face in 4-6 weeks then 6 months with Dr Dara Lords, Ashok Cordia, CMA, am acting as scribe for Sarina Ser, MD .  Documentation: I have reviewed the above documentation for accuracy and completeness, and I agree with the above.  Sarina Ser, MD

## 2021-04-21 ENCOUNTER — Encounter: Payer: Self-pay | Admitting: Dermatology

## 2021-04-24 ENCOUNTER — Encounter: Payer: Self-pay | Admitting: Family Medicine

## 2021-04-24 ENCOUNTER — Ambulatory Visit: Payer: Medicare HMO | Admitting: Family Medicine

## 2021-04-24 ENCOUNTER — Other Ambulatory Visit: Payer: Self-pay

## 2021-04-24 VITALS — BP 133/60 | HR 67 | Ht 67.0 in | Wt 194.4 lb

## 2021-04-24 DIAGNOSIS — J9801 Acute bronchospasm: Secondary | ICD-10-CM

## 2021-04-24 DIAGNOSIS — R0609 Other forms of dyspnea: Secondary | ICD-10-CM

## 2021-04-24 DIAGNOSIS — R0600 Dyspnea, unspecified: Secondary | ICD-10-CM | POA: Diagnosis not present

## 2021-04-24 MED ORDER — ALBUTEROL SULFATE HFA 108 (90 BASE) MCG/ACT IN AERS: 2.0000 | INHALATION_SPRAY | RESPIRATORY_TRACT | 2 refills | Status: DC | PRN

## 2021-04-24 NOTE — Patient Instructions (Addendum)
Thank you for coming to the office today.  Keep on Albuterol rescue inhaler as needed for wheezing, shortness of breath, coughing.  When you get out and do more activity in the near future once the eyes are sorted, you can keep using the Albuterol and if you need more help with breathing with activity, we can consider other inhaler options for of a DAILY option, we can do samples or new rx, and if need we can refer to Pulmonologist, call or come back sooner if you need assistance.  Otherwise keep upcoming appt.   Please schedule a Follow-up Appointment to: Return if symptoms worsen or fail to improve, for keep apt in Sept 2022.  If you have any other questions or concerns, please feel free to call the office or send a message through [place]. You may also schedule an earlier appointment if necessary.  Additionally, you may be receiving a survey about your experience at our office within a few days to 1 week by e-mail or mail. We value your feedback.  Nobie Putnam, DO [place]

## 2021-04-24 NOTE — Progress Notes (Signed)
Subjective:    Patient ID: Stephen White, male    DOB: [birth year], 85 y.o.   MRN: [id number]  Stephen White is a 85 y.o. male presenting on 04/24/2021 for Shortness of Breath and Fatigue   HPI   Follow-up Dyspnea Recent hospital visit 3/21 ED for dyspnea wheezing, he did well on Albuterol PRN Today now returns 6 weeks later for follow-up, unsure if symptoms ever fully resolved, but he does take the Albuterol PRN. Has no refills but still has inhaler unsure how much is left, does not want to use a daily inhaler at this time. He is former smoker. Today he feels comfortable that his breathing is better but he will continue to monitor it.  Cataracts Recently had cataract extraction Dr Wallace Going, will continue to follow-up with them. He is still struggling with vision and also is not able to do activity right now because of this post op   Depression screen [facility] 2/9 11/17/2020 08/15/2020 08/12/2020  Decreased Interest 0 0 0  Down, Depressed, Hopeless 0 0 0  PHQ - 2 Score 0 0 0    Social History   Tobacco Use  . Smoking status: Former Smoker    Packs/day: 0.30    Years: 25.00    Pack years: 7.50    Types: Cigarettes    Quit date: 12/19/1978    Years since quitting: 42.3  . Smokeless tobacco: Former Network engineer  . Vaping Use: Never used  Substance Use Topics  . Alcohol use: Yes    Alcohol/week: 0.0 standard drinks  . Drug use: No    Review of Systems Per HPI unless specifically indicated above     Objective:    BP 133/60   Pulse 67   Ht 5\' 7"  (1.702 m)   Wt 194 lb 6.4 oz (88.2 kg)   SpO2 98%   BMI 30.45 kg/m   Wt Readings from Last 3 Encounters:  04/24/21 194 lb 6.4 oz (88.2 kg)  03/30/21 185 lb (83.9 kg)  03/13/21 195 lb (88.5 kg)    Physical Exam Vitals and nursing note reviewed.  Constitutional:      General: He is not in acute distress.    Appearance: He is well-developed. He is not diaphoretic.     Comments: Well-appearing, comfortable,  cooperative  HENT:     Head: Normocephalic and atraumatic.  Eyes:     General:        Right eye: No discharge.        Left eye: No discharge.     Conjunctiva/sclera: Conjunctivae normal.  Neck:     Thyroid: No thyromegaly.  Cardiovascular:     Rate and Rhythm: Normal rate and regular rhythm.     Heart sounds: Normal heart sounds. No murmur heard.   Pulmonary:     Effort: No respiratory distress.     Breath sounds: Normal breath sounds. No wheezing or rales.     Comments: Increased work of breathing at times during discussion but lung sounds are clear. Musculoskeletal:        General: Normal range of motion.     Cervical back: Normal range of motion and neck supple.  Lymphadenopathy:     Cervical: No cervical adenopathy.  Skin:    General: Skin is warm and dry.     Findings: No erythema or rash.  Neurological:     Mental Status: He is alert and oriented to person, place, and time.  Psychiatric:  Behavior: Behavior normal.     Comments: Well groomed, good eye contact, normal speech and thoughts    Results for orders placed or performed during the hospital encounter of 04/03/21  Glucose, capillary  Result Value Ref Range   Glucose-Capillary 104 (H) 70 - 99 mg/dL      Assessment & Plan:   Problem List Items Addressed This Visit   None   Visit Diagnoses    Dyspnea on exertion    -  Primary   Relevant Medications   albuterol (VENTOLIN HFA) 108 (90 Base) MCG/ACT inhaler   Bronchospasm, acute       Relevant Medications   albuterol (VENTOLIN HFA) 108 (90 Base) MCG/ACT inhaler      Dyspnea on exertion Improved overall Still has some dyspnea at baseline, advanced compared to previously in past. He is less active and deconditioned now, post-op with cataracts etc.  Continue Albuterol PRN, has inhaler still, added refills today Consider future sample if needed for maintenance inhaler, as former smoker may have underlying COPD component Consider refer to Pulm if not  able to tolerate activity with dyspnea in near future when he resumes activity Return criteria given.  Meds ordered this encounter  Medications  . albuterol (VENTOLIN HFA) 108 (90 Base) MCG/ACT inhaler    Sig: Inhale 2 puffs into the lungs every 4 (four) hours as needed for wheezing or shortness of breath.    Dispense:  8 g    Refill:  2    Add refills on file      Follow up plan: Return if symptoms worsen or fail to improve, for keep apt in Sept 2022.  Nobie Putnam, [facility] 04/24/2021, 1:54 PM

## 2021-04-27 ENCOUNTER — Ambulatory Visit (INDEPENDENT_AMBULATORY_CARE_PROVIDER_SITE_OTHER): Payer: Medicare HMO | Admitting: Pharmacist

## 2021-04-27 DIAGNOSIS — N184 Chronic kidney disease, stage 4 (severe): Secondary | ICD-10-CM

## 2021-04-27 DIAGNOSIS — E1121 Type 2 diabetes mellitus with diabetic nephropathy: Secondary | ICD-10-CM

## 2021-04-27 DIAGNOSIS — Z794 Long term (current) use of insulin: Secondary | ICD-10-CM | POA: Diagnosis not present

## 2021-04-27 DIAGNOSIS — I129 Hypertensive chronic kidney disease with stage 1 through stage 4 chronic kidney disease, or unspecified chronic kidney disease: Secondary | ICD-10-CM | POA: Diagnosis not present

## 2021-04-27 DIAGNOSIS — R0600 Dyspnea, unspecified: Secondary | ICD-10-CM

## 2021-04-27 DIAGNOSIS — R0609 Other forms of dyspnea: Secondary | ICD-10-CM

## 2021-04-27 NOTE — Patient Instructions (Signed)
Visit Information  PATIENT GOALS: Goals Addressed            This Visit's Progress   . Pharmacy Goals       Our goal A1c is less than 7%. This corresponds with fasting sugars less than 130 and 2 hour after meal sugars less than 180. Please continue to monitor home blood sugar.  Our goal bad cholesterol, or LDL, is less than 70 . This is why it is important to continue taking your pravastatin.  Please check your home blood pressure, keep a log of the results and bring this with you to your medical appointments.   Feel free to call me with any questions or concerns. I look forward to our next call!  Harlow Asa, PharmD, [place], CPP Clinical Pharmacist [facility] [phone]       The patient verbalized understanding of instructions, educational materials, and care plan provided today and declined offer to receive copy of patient instructions, educational materials, and care plan.   Telephone follow up appointment with care management team member scheduled for: 6/13 at 1:45 pm

## 2021-04-27 NOTE — Chronic Care Management (AMB) (Signed)
Chronic Care Management Pharmacy Note  04/27/2021 Name:  Stephen White MRN:  [id number] DOB:  [birth year]  Subjective: Stephen White is an 85 y.o. year old male who is a primary patient of Olin Hauser, DO.  The CCM team was consulted for assistance with disease management and care coordination needs.    Engaged with patient by telephone for follow up visit in response to provider referral for pharmacy case management and/or care coordination services.   Consent to Services:  The patient was given information about Chronic Care Management services, agreed to services, and gave verbal consent prior to initiation of services.  Please see initial visit note for detailed documentation.   Patient Care Team: Olin Hauser, DO as PCP - General (Family Medicine) Eulogio Bear, MD as Consulting Physician (Ophthalmology) Gabriel Carina Betsey Holiday, MD as Physician Assistant (Endocrinology) Ralene Bathe, MD as Consulting Physician (Dermatology) Winfield Cunas Virl Diamond, RPH-CPP as Pharmacist  Recent office visits: Office Visit with PCP on 5/6 for shortness of breath and fatigue  Recent consult visits: Office Visit with [place] on 4/7 Office Visit with [place] on 5/2  Hospital visits: [place] on 4/15 for left cataract surgery Patient seen at [facility] ED on 3/21 for shortness of breath.   Objective:  Lab Results  Component Value Date   CREATININE 2.31 (H) 03/09/2021   CREATININE 2.69 (H) 02/24/2021   CREATININE 2.62 (H) 09/08/2020    Lab Results  Component Value Date   HGBA1C 8.1 12/31/2020   Last diabetic Eye exam:  Lab Results  Component Value Date/Time   HMDIABEYEEXA Retinopathy (A) 01/08/2021 10:34 AM    Last diabetic Foot exam: No results found for: HMDIABFOOTEX      Component Value Date/Time   CHOL 152 08/01/2020 0838   CHOL 162 11/07/2018 0958   TRIG 68 08/01/2020 0838   HDL 56  08/01/2020 0838   HDL 54 11/07/2018 0958   CHOLHDL 2.7 08/01/2020 0838   LDLCALC 82 08/01/2020 0838    Hepatic Function Latest Ref Rng & Units 09/08/2020 11/07/2018 12/05/2017  Total Protein 6.1 - 8.1 g/dL 6.6 6.6 6.4  Albumin 3.5 - 4.8 g/dL - 4.1 -  AST 10 - 35 U/L _0 ALT 9 - 46 U/L _1 Alk Phosphatase 39 - 117 IU/L - 55 -  Total Bilirubin 0.2 - 1.2 mg/dL 0.5 0.4 0.6   Clinical ASCVD: No  The ASCVD Risk score Mikey Bussing DC Jr., et al., 2013) failed to calculate for the following reasons:   The 2013 ASCVD risk score is only valid for ages 88 to 61     Social History   Tobacco Use  Smoking Status Former Smoker  . Packs/day: 0.30  . Years: 25.00  . Pack years: 7.50  . Types: Cigarettes  . Quit date: 12/19/1978  . Years since quitting: 42.3  Smokeless Tobacco Former User   BP Readings from Last 3 Encounters:  04/24/21 133/60  04/03/21 138/67  03/13/21 (!) 155/66   Pulse Readings from Last 3 Encounters:  04/24/21 67  04/03/21 (!) 59  03/09/21 70   Wt Readings from Last 3 Encounters:  04/24/21 194 lb 6.4 oz (88.2 kg)  03/30/21 185 lb (83.9 kg)  03/13/21 195 lb (88.5 kg)    Assessment: Review of patient past medical history, allergies, medications, health status, including review of consultants reports, laboratory and other test data, was performed as part of  comprehensive evaluation and provision of chronic care management services.   SDOH:  (Social Determinants of Health) assessments and interventions performed: none   CCM Care Plan  Allergies  Allergen Reactions  . Prednisone     Raised "sugar level"    Medications Reviewed Today    Reviewed by Olin Hauser, DO (Physician) on 04/24/21 at 1347  Med List Status: <None>  Medication Order Taking? Sig Documenting Provider Last Dose Status Informant  albuterol (VENTOLIN HFA) 108 (90 Base) MCG/ACT inhaler [id number] Yes Inhale 2 puffs into the lungs every 4 (four) hours as needed for wheezing or  shortness of breath. Duffy Bruce, MD Taking Active   aspirin 81 MG tablet [id number] Yes Take 81 mg by mouth daily.  [provider] Taking Active            Med Note Emmaline Kluver May 05, 2015 12:22 PM) Received from: [place] Sodium ([place]) [id number] Yes Apply to eye.  [provider] Taking Active   Continuous Blood Gluc Receiver (FREESTYLE LIBRE 2 READER) DEVI [id number] Yes by Does not apply route. [provider] Taking Active   Emollient (GOLD Lorine Bears [id number] Yes Apply topically at bedtime. Diabetic lotion [provider] Taking Active   famotidine (PEPCID) 10 MG tablet [id number] Yes Take 1 tablet (10 mg total) by mouth daily. Olin Hauser, DO Taking Active   fluticasone (FLONASE) 50 MCG/ACT nasal spray [id number] Yes Place 2 sprays into both nostrils daily. Use for 4-6 weeks then stop and use seasonally or as needed. Olin Hauser, DO Taking Active   gabapentin (NEURONTIN) 300 MG capsule [id number] Yes Take 300 mg by mouth at bedtime as needed. [provider] Taking Active   hydrochlorothiazide (HYDRODIURIL) 25 MG tablet [id number] Yes Take 1 tablet (25 mg total) by mouth daily. Olin Hauser, DO Taking Active   insulin aspart (NOVOLOG) 100 UNIT/ML FlexPen [id number] Yes Take NovoLog insulin 10-15 minutes BEFORE you eat.  Take NovoLog 32 units before breakfast Take NovoLog 15 units before lunch Take NovoLog 32 units before supper.  Adjust for CBG <70 following direction from Endocrinologist [provider] Taking Active   insulin detemir (LEVEMIR) 100 UNIT/ML FlexPen [id number] Yes Inject into the skin. Inject 15 Units subcutaneously nightly [provider] Taking Active   loratadine (CLARITIN) 10 MG tablet [id number] Yes Take 10 mg by mouth every other day as needed for allergies.  [provider] Taking  Active   losartan (COZAAR) 100 MG tablet [id number] Yes TAKE 1 TABLET(100 MG) BY MOUTH DAILY Olin Hauser, DO Taking Active   MULTIPLE VITAMIN PO [id number] Yes Take by mouth daily.  [provider] Taking Active            Med Note Emmaline Kluver May 05, 2015 12:22 PM) Received from: [place] TEST test strip [id number] Yes 1 each by Other route as needed.  [provider] Taking Active            Med Note Doristine Devoid   Fri Oct 03, 2015  9:40 AM) Received from: External Pharmacy  pravastatin (PRAVACHOL) 10 MG tablet [id number] Yes TAKE 1 TABLET BY MOUTH EVERY DAY Olin Hauser, DO Taking Active           Patient Active Problem List   Diagnosis Date Noted  . Chronic frontal sinusitis 11/17/2020  . Flu vaccine  need 10/16/2020  . Episodic lightheadedness 04/01/2020  . Type 2 diabetes mellitus with diabetic nephropathy, with long-term current use of insulin ([place]) 02/14/2019  . Flatulence 11/03/2016  . Multiple actinic keratoses 05/05/2015  . Arthropathia 05/05/2015  . CAFL (chronic airflow limitation) ([place]) 05/05/2015  . 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 Gastro-esophageal reflux disease without esophagitis 05/05/2015  . Retinopathy 12/23/2014  . Microalbuminuria 12/23/2014  . CKD (chronic kidney disease), stage IV ([place]) 05/07/2010  . Allergic rhinitis 05/14/2008  . Benign hypertension with CKD (chronic kidney disease) stage IV ([place]) 10/18/2007  . HLD (hyperlipidemia) 09/06/2007    Immunization History  Administered Date(s) Administered  . Fluad Quad(high Dose 65+) 11/02/2019, 10/16/2020  . Influenza, High Dose Seasonal PF 10/03/2015, 11/03/2016, 10/06/2017, 11/06/2018  . PFIZER(Purple Top)SARS-COV-2 Vaccination 01/14/2020, 02/04/2020  . Pneumococcal Conjugate-13 12/24/2014  . Pneumococcal Polysaccharide-23 12/25/2012  . Td 07/02/2015  . Zoster 12/25/2012    Conditions  to be addressed/monitored: HTN, HLD, DMII and CKD  Care Plan : PharmD - Medication Managment and Assistance  Updates made by Vella Raring, RPH-CPP since 04/27/2021 12:00 AM    Problem: Disease Progression     Long-Range Goal: Disease Progression Prevented or Minimized   Start Date: 02/02/2021  Expected End Date: 05/03/2021  This Visit's Progress: On track  Recent Progress: On track  Priority: High  Note:   Current Barriers:  . Financial Barriers in complicated patient with multiple medical conditions including T2DM, CKD, GERD, HTN, HLD; patient has [employer] and reports copay for [place] is cost prohibitive at this time o Approved to receive Novolog and Levemir from [employer] patient assistance program through 12/19/2021  Pharmacist Clinical Goal(s):  Marland Kitchen Over the next 90 days, patient will achieve adherence to monitoring guidelines and medication adherence to achieve therapeutic efficacy through collaboration with PharmD and provider.   Interventions: . 1:1 collaboration with Olin Hauser, DO regarding development and update of comprehensive plan of care as evidenced by provider attestation and co-signature . Inter-disciplinary care team collaboration (see longitudinal plan of care) . Perform chart review o Patient seen for Office Visit with [place] on 4/7 o Admitted to [facility] on 4/15 for left cataract surgery o Office Visit with [facility] on [place] Visit with PCP on 5/6 for follow up of shortness of breath and fatigue. Provider advised patient to:  - Continue Albuterol PRN - Consider future sample if needed for maintenance inhaler - Will consider refer to Pulmonology  . Today reports vision remains limited since cataract surgery; has follow up appointment with [facility] tomorrow . Reports that he has been following dietary guidance from Nephrologist  Shortness of  breath: . Today patient reports that his shortness of breath has been better.  . Confirms using albuterol inhaler as needed as directed and checks to make sure that has plenty of puffs in inhaler o Encourage patient to carry rescue inhaler with him when out of the house as well  Type 2 Diabetes o Patient followed by [facility] Endocrinology for management o Uncontrolled; current treatment:  Levemir: 15 units nightly  NovoLog: 32 units before breakfast 15 units before lunch  32 units before supper ? Patient verbalizes understanding that if blood glucose <70 before meal, take 1/2 scheduled dose for Novolog, as directed by Endocrinologist ? Admits to occasionally missing a dose of Novolog due to fear of lows ? Counsel on importance of/rational for taking regimen as directed  by Endocrinologist, eating regular meals, and following direction from provider for adjustment if blood glucose <70 before a meal ? Have counseled on importance of administering Novolog doses 10 minutes prior to meals ? Reports using FreeStyle Libre 2 continuous glucose monitor ? Orders sensor refills from [employer] ? Target goal range: 80-180 mg/dL ? Reports alarms set to go off for readings outside of range: 70-240 mg/dL ? Reports continuous glucose monitor data from last 14-days: ? Average glucose: 183 ? Time in Target: ? Above: 44% ? In Target: 55% ? Below: 1% ? Again counsel on importance of having regular balanced meals, reviewing nutrition labels for carbohydrate content ? Counsel patient on importance of calling Endocrinology as needed for concerns related to his blood sugar ? Have counseled patient on s/s and how to manage low blood sugars  Hypertension . Current regimen: o HCTZ 25 mg daily o Losartan 100 mg daily . Reports checked BP on Saturday and recalls that it was "good", but denies having recorded it due to current limited vision . Counsel patient to monitor home BP monitoring, keep  log and have record for [place] to review during next appointment  Hyperlipidemia . Current regimen: pravastatin 10 mg daily . Have counseled on importance of reviewing nutrition labels for cholesterol content.  Coordination of care: Marland Kitchen Encourage patient to call Nephrology office to reschedule missed follow up appointment  Patient Goals/Self-Care Activities . Over the next 90 days, patient will:  - take medications as prescribed using weekly pillbox - monitor blood glucose as directed by Endocrinologist  Note patient using FreeStyle Libre 2 continuous glucose monitor - check blood pressure, document, and provide at future appointments - attend medical appointments as scheduled Next appointment with Endocrinology on 6/3 (lab work on 5/27) PCP on 9/14  Follow Up Plan: Telephone follow up appointment with care management team member scheduled for: 6/13 at 1:45 pm      Medication Assistance:Approved to receive Novolog and Levemir from [employer] patient assistance program through 12/19/2021  Patient's preferred pharmacy is:  [facility] [place], [place] [address] [place] [address] Phone: [phone] Fax: [place], [address] [address] Phone: [phone] Fax: [place], [place] [address] Phone: [phone] Fax: [phone]  Uses pill box? Yes  Follow Up:  Patient agrees to Care Plan and Follow-up.  Harlow Asa, PharmD, [place], CPP Clinical Pharmacist [facility] [phone]

## 2021-05-13 ENCOUNTER — Other Ambulatory Visit: Payer: Self-pay

## 2021-05-13 ENCOUNTER — Ambulatory Visit
Admission: RE | Admit: 2021-05-13 | Discharge: 2021-05-13 | Disposition: A | Discharge status: Home / Self Care | Payer: Medicare HMO | Source: Ambulatory Visit | Attending: Nephrology | Admitting: Nephrology

## 2021-05-13 DIAGNOSIS — E1122 Type 2 diabetes mellitus with diabetic chronic kidney disease: Secondary | ICD-10-CM | POA: Insufficient documentation

## 2021-05-13 DIAGNOSIS — N184 Chronic kidney disease, stage 4 (severe): Secondary | ICD-10-CM | POA: Diagnosis present

## 2021-05-13 DIAGNOSIS — R8290 Unspecified abnormal findings in urine: Secondary | ICD-10-CM

## 2021-05-25 ENCOUNTER — Ambulatory Visit: Payer: Medicare HMO

## 2021-06-01 ENCOUNTER — Telehealth: Payer: Self-pay | Admitting: Pharmacist

## 2021-06-01 ENCOUNTER — Telehealth: Payer: Self-pay

## 2021-06-01 NOTE — Telephone Encounter (Signed)
  Chronic Care Management   Outreach Note  06/01/2021 Name: HAWARD POPE MRN: [id number] DOB: August 05, 1936  Referred by: Olin Hauser, DO Reason for referral : No chief complaint on file.   Receive a telephone call from patient today requesting to reschedule our appointment this afternoon  Follow Up Plan: Telephone appointment with CM Pharmacist rescheduled for 6/15 at 2 pm  Harlow Asa, PharmD, [place] [facility] [phone]

## 2021-06-03 ENCOUNTER — Ambulatory Visit (INDEPENDENT_AMBULATORY_CARE_PROVIDER_SITE_OTHER): Payer: Medicare HMO | Admitting: Pharmacist

## 2021-06-03 DIAGNOSIS — E1121 Type 2 diabetes mellitus with diabetic nephropathy: Secondary | ICD-10-CM

## 2021-06-03 DIAGNOSIS — E1169 Type 2 diabetes mellitus with other specified complication: Secondary | ICD-10-CM

## 2021-06-03 DIAGNOSIS — Z794 Long term (current) use of insulin: Secondary | ICD-10-CM

## 2021-06-03 DIAGNOSIS — N184 Chronic kidney disease, stage 4 (severe): Secondary | ICD-10-CM | POA: Diagnosis not present

## 2021-06-03 DIAGNOSIS — E785 Hyperlipidemia, unspecified: Secondary | ICD-10-CM | POA: Diagnosis not present

## 2021-06-03 DIAGNOSIS — I129 Hypertensive chronic kidney disease with stage 1 through stage 4 chronic kidney disease, or unspecified chronic kidney disease: Secondary | ICD-10-CM

## 2021-06-03 NOTE — Patient Instructions (Signed)
Visit Information  PATIENT GOALS:  Goals Addressed             This Visit's Progress    Pharmacy Goals       Our goal A1c is less than 7%. This corresponds with fasting sugars less than 130 and 2 hour after meal sugars less than 180. Please continue to monitor home blood sugar.  Our goal bad cholesterol, or LDL, is less than 70 . This is why it is important to continue taking your pravastatin.  Please check your home blood pressure, keep a log of the results and have this record for [place] to review during our next telephone appointment   Feel free to call me with any questions or concerns. I look forward to our next call!   Harlow Asa, PharmD, [place], CPP Clinical Pharmacist [facility] [phone]         The patient verbalized understanding of instructions, educational materials, and care plan provided today and declined offer to receive copy of patient instructions, educational materials, and care plan.   Telephone follow up appointment with care management team member scheduled for: 6/27 at 12:30 pm

## 2021-06-03 NOTE — Chronic Care Management (AMB) (Signed)
Chronic Care Management Pharmacy Note  06/03/2021 Name:  Stephen White MRN:  [id number] DOB:  [birth year]   Subjective: Stephen White is an 85 y.o. year old male who is a primary patient of Olin Hauser, DO.  The CCM team was consulted for assistance with disease management and care coordination needs.    Engaged with patient by telephone for follow up visit in response to provider referral for pharmacy case management and/or care coordination services.   Consent to Services:  The patient was given information about Chronic Care Management services, agreed to services, and gave verbal consent prior to initiation of services.  Please see initial visit note for detailed documentation.   Patient Care Team: Olin Hauser, DO as PCP - General (Family Medicine) Eulogio Bear, MD as Consulting Physician (Ophthalmology) Gabriel Carina Betsey Holiday, MD as Physician Assistant (Endocrinology) Ralene Bathe, MD as Consulting Physician (Dermatology) Merrianne Mccumbers, Virl Diamond, RPH-CPP as Pharmacist   Recent consult visits: Office Visit with [facility] on 6/2 Office Visit with [facility] Endocrinology on 6/3  Hospital visits: [facility] on 4/15 for left cataract surgery Patient seen at [facility] ED on 3/21 for shortness of breath.  Objective:  Lab Results  Component Value Date   CREATININE 2.31 (H) 03/09/2021   CREATININE 2.69 (H) 02/24/2021   CREATININE 2.62 (H) 09/08/2020  Latest creatinine value from [facility] shared record: 2.6 mg/dL on 05/13/2021   Lab Results  Component Value Date   HGBA1C 8.1 12/31/2020  Latest A1C value from [facility] shared record:8.0% on 05/13/2021  Last diabetic Eye exam:  Lab Results  Component Value Date/Time   HMDIABEYEEXA Retinopathy (A) 01/08/2021 10:34 AM    Last diabetic Foot exam: No results found for: HMDIABFOOTEX      Component Value Date/Time   CHOL 152 08/01/2020  0838   CHOL 162 11/07/2018 0958   TRIG 68 08/01/2020 0838   HDL 56 08/01/2020 0838   HDL 54 11/07/2018 0958   CHOLHDL 2.7 08/01/2020 0838   LDLCALC 82 08/01/2020 0838    Hepatic Function Latest Ref Rng & Units 09/08/2020 11/07/2018 12/05/2017  Total Protein 6.1 - 8.1 g/dL 6.6 6.6 6.4  Albumin 3.5 - 4.8 g/dL - 4.1 -  AST 10 - 35 U/L 19 16 18   ALT 9 - 46 U/L 17 17 17   Alk Phosphatase 39 - 117 IU/L - 55 -  Total Bilirubin 0.2 - 1.2 mg/dL 0.5 0.4 0.6    Social History   Tobacco Use  Smoking Status Former   Packs/day: 0.30   Years: 25.00   Pack years: 7.50   Types: Cigarettes   Quit date: 12/19/1978   Years since quitting: 42.4  Smokeless Tobacco Former   BP Readings from Last 3 Encounters:  04/24/21 133/60  04/03/21 138/67  03/13/21 (!) 155/66   Pulse Readings from Last 3 Encounters:  04/24/21 67  04/03/21 (!) 59  03/09/21 70   Wt Readings from Last 3 Encounters:  04/24/21 194 lb 6.4 oz (88.2 kg)  03/30/21 185 lb (83.9 kg)  03/13/21 195 lb (88.5 kg)    Assessment: Review of patient past medical history, allergies, medications, health status, including review of consultants reports, laboratory and other test data, was performed as part of comprehensive evaluation and provision of chronic care management services.   SDOH:  (Social Determinants of Health) assessments and interventions performed: none   CCM Care Plan  Allergies  Allergen Reactions  Prednisone     Raised "sugar level"    Medications Reviewed Today     Reviewed by Olin Hauser, DO (Physician) on 04/24/21 at 1347  Med List Status: <None>   Medication Order Taking? Sig Documenting Provider Last Dose Status Informant  albuterol (VENTOLIN HFA) 108 (90 Base) MCG/ACT inhaler [id number] Yes Inhale 2 puffs into the lungs every 4 (four) hours as needed for wheezing or shortness of breath. Duffy Bruce, MD Taking Active   aspirin 81 MG tablet [id number] Yes Take 81 mg by mouth daily.   [provider] Taking Active            Med Note Emmaline Kluver May 05, 2015 12:22 PM) Received from: [facility] ([place]) [id number] Yes Apply to eye.  [provider] Taking Active   Continuous Blood Gluc Receiver (FREESTYLE LIBRE 2 READER) DEVI [id number] Yes by Does not apply route. [provider] Taking Active   Emollient (GOLD [place] Bears [id number] Yes Apply topically at bedtime. Diabetic lotion [provider] Taking Active   famotidine (PEPCID) 10 MG tablet [id number] Yes Take 1 tablet (10 mg total) by mouth daily. Olin Hauser, DO Taking Active   fluticasone (FLONASE) 50 MCG/ACT nasal spray [id number] Yes Place 2 sprays into both nostrils daily. Use for 4-6 weeks then stop and use seasonally or as needed. Olin Hauser, DO Taking Active   gabapentin (NEURONTIN) 300 MG capsule [id number] Yes Take 300 mg by mouth at bedtime as needed. [provider] Taking Active   hydrochlorothiazide (HYDRODIURIL) 25 MG tablet [id number] Yes Take 1 tablet (25 mg total) by mouth daily. Olin Hauser, DO Taking Active   insulin aspart (NOVOLOG) 100 UNIT/ML FlexPen [id number] Yes Take NovoLog insulin 10-15 minutes BEFORE you eat.  Take NovoLog 32 units before breakfast Take NovoLog 15 units before lunch Take NovoLog 32 units before supper.  Adjust for CBG <70 following direction from Endocrinologist [provider] Taking Active   insulin detemir (LEVEMIR) 100 UNIT/ML FlexPen [id number] Yes Inject into the skin. Inject 15 Units subcutaneously nightly [provider] Taking Active   loratadine (CLARITIN) 10 MG tablet [id number] Yes Take 10 mg by mouth every other day as needed for allergies.  [provider] Taking Active   losartan (COZAAR) 100 MG tablet [id number] Yes TAKE 1 TABLET(100 MG) BY MOUTH DAILY Olin Hauser, DO  Taking Active   MULTIPLE VITAMIN PO [id number] Yes Take by mouth daily.  [provider] Taking Active            Med Note Emmaline Kluver May 05, 2015 12:22 PM) Received from: [facility] TEST test strip [id number] Yes 1 each by Other route as needed.  [provider] Taking Active            Med Note Doristine Devoid   Fri Oct 03, 2015  9:40 AM) Received from: External Pharmacy  pravastatin (PRAVACHOL) 10 MG tablet [id number] Yes TAKE 1 TABLET BY MOUTH EVERY DAY Olin Hauser, DO Taking Active             Patient Active Problem List   Diagnosis Date Noted   Chronic frontal sinusitis 11/17/2020   Flu vaccine need 10/16/2020   Episodic lightheadedness 04/01/2020   Type 2 diabetes mellitus with diabetic nephropathy, with long-term current use of insulin ([place]) 02/14/2019   Flatulence 11/03/2016  Multiple actinic keratoses 05/05/2015   Arthropathia 05/05/2015   CAFL (chronic airflow limitation) ([place]) 05/05/2015   Impotence of organic origin 05/05/2015   Gastro-esophageal reflux disease without esophagitis 05/05/2015   Retinopathy 12/23/2014   Microalbuminuria 12/23/2014   CKD (chronic kidney disease), stage IV (HCC) 05/07/2010   Allergic rhinitis 05/14/2008   Benign hypertension with CKD (chronic kidney disease) stage IV ([place]) 10/18/2007   HLD (hyperlipidemia) 09/06/2007    Immunization History  Administered Date(s) Administered   Fluad Quad(high Dose 65+) 11/02/2019, 10/16/2020   Influenza, High Dose Seasonal PF 10/03/2015, 11/03/2016, 10/06/2017, 11/06/2018   PFIZER(Purple Top)SARS-COV-2 Vaccination 01/14/2020, 02/04/2020   Pneumococcal Conjugate-13 12/24/2014   Pneumococcal Polysaccharide-23 12/25/2012   Td 07/02/2015   Zoster, Live 12/25/2012    Conditions to be addressed/monitored: HTN, HLD, DMII and CKD  Care Plan : PharmD - Medication Managment and Assistance  Updates made by  Vella Raring, RPH-CPP since 06/03/2021 12:00 AM     Problem: Disease Progression      Long-Range Goal: Disease Progression Prevented or Minimized   Start Date: 02/02/2021  Expected End Date: 05/03/2021  This Visit's Progress: On track  Recent Progress: On track  Priority: High  Note:   Current Barriers:  Financial Barriers in complicated patient with multiple medical conditions including T2DM, CKD, GERD, HTN, HLD; patient has [employer] and reports copay for [place] is cost prohibitive at this time Approved to receive Novolog and Levemir from [employer] patient assistance program through 12/19/2021  Pharmacist Clinical Goal(s):  Over the next 90 days, patient will achieve adherence to monitoring guidelines and medication adherence to achieve therapeutic efficacy through collaboration with PharmD and provider.   Interventions: 1:1 collaboration with Olin Hauser, DO regarding development and update of comprehensive plan of care as evidenced by provider attestation and co-signature Inter-disciplinary care team collaboration (see longitudinal plan of care) Perform chart review Patient seen for Office Visit with [facility] on 6/2 Note BP elevated during visit (155/82, HR 66), but provider noted patient did not take his antihypertensive medication that morning. Office Visit with [facility] Endocrinology on 6/3. Provider advised patient: "Your hemoglobin A1c value is now 8%  Continue Levemir 15 units at bedtime (between 8 - 10pm)  Adjust the before meal insulin NovoLog  40 units before breakfast 15 units before lunch 40 units before supper If blood glucose less than 70 before meal, take 1/2 your scheduled doses: 20 units, 7 units and 20 units" Today reports that he had a fall 1 week ago on 6/8 when coming out of [employer]. Reports he was watching traffic and missed the curb. Denies any dizziness or trouble with  balance contributing to fall. Reports scrapped up his hands, but that both are healing well. Denies any other injuries.  Type 2 Diabetes Patient followed by [facility] Endocrinology for management Uncontrolled; current treatment: Levemir: 15 units nightly NovoLog: 40 units before breakfast 15 units before lunch  40 units before supper Patient verbalizes understanding that if blood glucose <70 before meal, take 1/2 scheduled dose for Novolog, as directed by Endocrinologist Reports has been taking Novolog doses as directed Counsel on importance of/rational for taking regimen as directed by Endocrinologist, eating regular meals, and following direction from provider for adjustment if blood glucose <70 before a meal Have counseled on importance of administering Novolog doses 10 minutes prior to meals Patient using FreeStyle Libre 2 continuous glucose monitor Orders sensor refills from [employer] Target goal range: 80-180 mg/dL  Reports alarms set to go off for readings outside of range: 70-240 mg/dL Reports continuous glucose monitor data from last 14-days: Average glucose: 175 Time in Target: Above: 43% In Target: 55% Below: 2% 88% of sensor data was captured over the last 2 weeks Again counsel on importance of having regular balanced meals, reviewing nutrition labels for carbohydrate content Reports also following dietary guidance from Nephrologist Have counseled on importance of calling Endocrinology as needed for concerns related to his blood sugar Discuss s/s and how to manage hypoglycemia Encourage patient to pick up glucose tablets to carry with him  Hypertension Current regimen: HCTZ 25 mg daily Losartan 100 mg daily Reports BP reading today: 154/72, HR 70  Reports had been active prior to taking the reading - rushing to take out trash, washing dishes Counsel patient on BP monitoring technique, including resting prior to readings and waiting 30 minutes after  exercise to check Counsel patient to monitor home BP monitoring, keep log and have record for [place] to review during next appointment  Hyperlipidemia Current regimen: pravastatin 10 mg daily Have counseled on importance of reviewing nutrition labels for cholesterol content.  Coordination of care: Encourage patient to call Nephrology office to reschedule missed follow up appointment  Patient Goals/Self-Care Activities Over the next 90 days, patient will:  - take medications as prescribed using weekly pillbox - monitor blood glucose as directed by Endocrinologist  Note patient using FreeStyle Libre 2 continuous glucose monitor - check blood pressure, document, and provide at future appointments - attend medical appointments as scheduled   Follow Up Plan: Telephone follow up appointment with care management team member scheduled for: 6/27 at 12:30 pm      Medication Assistance: Approved to receive Novolog and Levemir from [employer] patient assistance program through 12/19/2021  Patient's preferred pharmacy is:  [facility] [place], [place] [address] Phone: [phone] Fax: [place], [place] [address] [address] Phone: [phone] Fax: [place], [place] [address] Phone: [phone] Fax: [phone]  Uses pill box? Yes  Follow Up:  Patient agrees to Care Plan and Follow-up.  Harlow Asa, PharmD, Para [place], CPP Clinical Pharmacist [facility] [phone]

## 2021-06-15 ENCOUNTER — Ambulatory Visit: Payer: Self-pay | Admitting: Pharmacist

## 2021-06-15 DIAGNOSIS — I129 Hypertensive chronic kidney disease with stage 1 through stage 4 chronic kidney disease, or unspecified chronic kidney disease: Secondary | ICD-10-CM

## 2021-06-15 DIAGNOSIS — Z794 Long term (current) use of insulin: Secondary | ICD-10-CM

## 2021-06-15 NOTE — Patient Instructions (Signed)
Visit Information  PATIENT GOALS:  Goals Addressed             This Visit's Progress    Pharmacy Goals       Our goal A1c is less than 7%. This corresponds with fasting sugars less than 130 and 2 hour after meal sugars less than 180. Please continue to monitor home blood sugar.  Our goal bad cholesterol, or LDL, is less than 70. This is why it is important to continue taking your pravastatin.  Please check your home blood pressure, keep a log of the results and have this record for [place] to review during our next telephone appointment  Feel free to call me with any questions or concerns. I look forward to our next call!  Harlow Asa, PharmD, Para [place], CPP Clinical Pharmacist [facility] [phone]         The patient verbalized understanding of instructions, educational materials, and care plan provided today and declined offer to receive copy of patient instructions, educational materials, and care plan.   Telephone follow up appointment with care management team member scheduled for: 7/20 at 12:30 pm

## 2021-06-15 NOTE — Chronic Care Management (AMB) (Signed)
Chronic Care Management Pharmacy Note  06/15/2021 Name:  DEMARIUS White MRN:  [id number] DOB:  10-Dec-1936   Subjective: Stephen White is an 85 y.o. year old male who is a primary patient of Olin Hauser, DO.  The CCM team was consulted for assistance with disease management and care coordination needs.    Engaged with patient by telephone for follow up visit in response to provider referral for pharmacy case management and/or care coordination services.   Consent to Services:  The patient was given information about Chronic Care Management services, agreed to services, and gave verbal consent prior to initiation of services.  Please see initial visit note for detailed documentation.   Patient Care Team: Olin Hauser, DO as PCP - General (Family Medicine) Eulogio Bear, MD as Consulting Physician (Ophthalmology) Stephen Carina Betsey Holiday, MD as Physician Assistant (Endocrinology) Ralene Bathe, MD as Consulting Physician (Dermatology) Winfield Cunas Virl Diamond, RPH-CPP as Pharmacist  Recent office visits: None  Hospital visits: [facility] on 4/15 for left cataract surgery Patient seen at [facility] on 3/21 for shortness of breath.  Objective:  Lab Results  Component Value Date   CREATININE 2.31 (H) 03/09/2021   CREATININE 2.69 (H) 02/24/2021   CREATININE 2.62 (H) 09/08/2020    Lab Results  Component Value Date   HGBA1C 8.1 12/31/2020   Last diabetic Eye exam:  Lab Results  Component Value Date/Time   HMDIABEYEEXA Retinopathy (A) 01/08/2021 10:34 AM    Last diabetic Foot exam: No results found for: HMDIABFOOTEX      Component Value Date/Time   CHOL 152 08/01/2020 0838   CHOL 162 11/07/2018 0958   TRIG 68 08/01/2020 0838   HDL 56 08/01/2020 0838   HDL 54 11/07/2018 0958   CHOLHDL 2.7 08/01/2020 0838   LDLCALC 82 08/01/2020 0838    Hepatic Function Latest Ref Rng & Units 09/08/2020 11/07/2018 12/05/2017  Total Protein  6.1 - 8.1 g/dL 6.6 6.6 6.4  Albumin 3.5 - 4.8 g/dL - 4.1 -  AST 10 - 35 U/L _0 ALT 9 - 46 U/L _1 Alk Phosphatase 39 - 117 IU/L - 55 -  Total Bilirubin 0.2 - 1.2 mg/dL 0.5 0.4 0.6    Social History   Tobacco Use  Smoking Status Former   Packs/day: 0.30   Years: 25.00   Pack years: 7.50   Types: Cigarettes   Quit date: 12/19/1978   Years since quitting: 42.5  Smokeless Tobacco Former   BP Readings from Last 3 Encounters:  04/24/21 133/60  04/03/21 138/67  03/13/21 (!) 155/66   Pulse Readings from Last 3 Encounters:  04/24/21 67  04/03/21 (!) 59  03/09/21 70   Wt Readings from Last 3 Encounters:  04/24/21 194 lb 6.4 oz (88.2 kg)  03/30/21 185 lb (83.9 kg)  03/13/21 195 lb (88.5 kg)    Assessment: Review of patient past medical history, allergies, medications, health status, including review of consultants reports, laboratory and other test data, was performed as part of comprehensive evaluation and provision of chronic care management services.   SDOH:  (Social Determinants of Health) assessments and interventions performed: none   CCM Care Plan  Allergies  Allergen Reactions   Prednisone     Raised "sugar level"    Medications Reviewed Today     Reviewed by Olin Hauser, DO (Physician) on 04/24/21 at 1347  Med List Status: <None>   Medication Order Taking?  Sig Documenting Provider Last Dose Status Informant  albuterol (VENTOLIN HFA) 108 (90 Base) MCG/ACT inhaler [id number] Yes Inhale 2 puffs into the lungs every 4 (four) hours as needed for wheezing or shortness of breath. Duffy Bruce, MD Taking Active   aspirin 81 MG tablet [id number] Yes Take 81 mg by mouth daily.  [provider] Taking Active            Med Note Emmaline Kluver May 05, 2015 12:22 PM) Received from: [place] Sodium ([place]) [id number] Yes Apply to eye.  [provider] Taking  Active   Continuous Blood Gluc Receiver (FREESTYLE LIBRE 2 READER) DEVI [id number] Yes by Does not apply route. [provider] Taking Active   Emollient (GOLD [place] Bears [id number] Yes Apply topically at bedtime. Diabetic lotion [provider] Taking Active   famotidine (PEPCID) 10 MG tablet [id number] Yes Take 1 tablet (10 mg total) by mouth daily. Olin Hauser, DO Taking Active   fluticasone (FLONASE) 50 MCG/ACT nasal spray [id number] Yes Place 2 sprays into both nostrils daily. Use for 4-6 weeks then stop and use seasonally or as needed. Olin Hauser, DO Taking Active   gabapentin (NEURONTIN) 300 MG capsule [id number] Yes Take 300 mg by mouth at bedtime as needed. [provider] Taking Active   hydrochlorothiazide (HYDRODIURIL) 25 MG tablet [id number] Yes Take 1 tablet (25 mg total) by mouth daily. Olin Hauser, DO Taking Active   insulin aspart (NOVOLOG) 100 UNIT/ML FlexPen [id number] Yes Take NovoLog insulin 10-15 minutes BEFORE you eat.  Take NovoLog 32 units before breakfast Take NovoLog 15 units before lunch Take NovoLog 32 units before supper.  Adjust for CBG <70 following direction from Endocrinologist [provider] Taking Active   insulin detemir (LEVEMIR) 100 UNIT/ML FlexPen [id number] Yes Inject into the skin. Inject 15 Units subcutaneously nightly [provider] Taking Active   loratadine (CLARITIN) 10 MG tablet [id number] Yes Take 10 mg by mouth every other day as needed for allergies.  [provider] Taking Active   losartan (COZAAR) 100 MG tablet [id number] Yes TAKE 1 TABLET(100 MG) BY MOUTH DAILY Olin Hauser, DO Taking Active   MULTIPLE VITAMIN PO [id number] Yes Take by mouth daily.  [provider] Taking Active            Med Note Emmaline Kluver May 05, 2015 12:22 PM) Received from: [place] TEST test strip  [id number] Yes 1 each by Other route as needed.  [provider] Taking Active            Med Note Doristine Devoid   Fri Oct 03, 2015  9:40 AM) Received from: External Pharmacy  pravastatin (PRAVACHOL) 10 MG tablet [id number] Yes TAKE 1 TABLET BY MOUTH EVERY DAY Olin Hauser, DO Taking Active             Patient Active Problem List   Diagnosis Date Noted   Chronic frontal sinusitis 11/17/2020   Flu vaccine need 10/16/2020   Episodic lightheadedness 04/01/2020   Type 2 diabetes mellitus with diabetic nephropathy, with long-term current use of insulin ([place]) 02/14/2019   Flatulence 11/03/2016   Multiple actinic keratoses 05/05/2015   Arthropathia 05/05/2015   CAFL (chronic airflow limitation) ([place]) 05/05/2015   Impotence of organic origin 05/05/2015   Gastro-esophageal reflux disease without esophagitis 05/05/2015   Retinopathy 12/23/2014   Microalbuminuria 12/23/2014  CKD (chronic kidney disease), stage IV ([place]) 05/07/2010   Allergic rhinitis 05/14/2008   Benign hypertension with CKD (chronic kidney disease) stage IV ([place]) 10/18/2007   HLD (hyperlipidemia) 09/06/2007    Immunization History  Administered Date(s) Administered   Fluad Quad(high Dose 65+) 11/02/2019, 10/16/2020   Influenza, High Dose Seasonal PF 10/03/2015, 11/03/2016, 10/06/2017, 11/06/2018   PFIZER(Purple Top)SARS-COV-2 Vaccination 01/14/2020, 02/04/2020   Pneumococcal Conjugate-13 12/24/2014   Pneumococcal Polysaccharide-23 12/25/2012   Td 07/02/2015   Zoster, Live 12/25/2012    Conditions to be addressed/monitored: T2DM, HTN, HLD, CKD  Care Plan : PharmD - Medication Managment and Assistance  Updates made by Stephen White, RPH-CPP since 06/15/2021 12:00 AM     Problem: Disease Progression      Long-Range Goal: Disease Progression Prevented or Minimized   Start Date: 02/02/2021  Expected End Date: 05/03/2021  This Visit's Progress: On track  Recent Progress: On track   Priority: High  Note:   Current Barriers:  Financial Barriers in complicated patient with multiple medical conditions including T2DM, CKD, GERD, HTN, HLD; patient has [employer] and reports copay for [place] is cost prohibitive at this time Approved to receive Novolog and Levemir from [employer] patient assistance program through 12/19/2021  Pharmacist Clinical Goal(s):  Over the next 90 days, patient will achieve adherence to monitoring guidelines and medication adherence to achieve therapeutic efficacy through collaboration with PharmD and provider.   Interventions: 1:1 collaboration with Olin Hauser, DO regarding development and update of comprehensive plan of care as evidenced by provider attestation and co-signature Inter-disciplinary care team collaboration (see longitudinal plan of care) Patient asks about starting a "belly fat burner" supplement that he saw advertised on TV. Advise patient against taking this supplement due to concern for safety/potential interactions (with other medications and with disease states) of this supplement  Hypertension Current regimen: HCTZ 25 mg daily Losartan 100 mg daily Follow up today scheduled to review home BP monitoring results. However, patient denies monitoring recently. Last checked: 6/19: 145/89, HR 70 Counsel patient on BP monitoring technique, including resting prior to readings and waiting 30 minutes after exercise to check Counsel patient to monitor home BP monitoring, keep log and have record for [place] to review during next appointment  Type 2 Diabetes Patient followed by [facility] for management Uncontrolled; current treatment: Levemir: 15 units nightly NovoLog: 40 units before breakfast 15 units before lunch  40 units before supper Patient has verbalized understanding that if blood glucose <70 before meal, take 1/2 scheduled dose for Novolog, as directed by  Endocrinologist Have counseled on importance of administering Novolog doses 10 minutes prior to meals Patient using FreeStyle Libre 2 continuous glucose monitor Orders sensor refills from [employer] Target goal range: 80-180 mg/dL Reports alarms set to go off for readings outside of range: 70-240 mg/dL Continuous gluocose monitor data last revieweed on 6/15. Will review again during next telephone appointment Counsel on impact of diet and exercise of blood sugar control Reports stays active throughout the day around the house as well as out in the yard (gardening/mowing) Counseled on importance of calling Endocrinology as needed for concerns related to his blood sugar  Hyperlipidemia Current regimen: pravastatin 10 mg daily Have counseled on importance of reviewing nutrition labels for cholesterol content.   Patient Goals/Self-Care Activities Over the next 90 days, patient will:  - take medications as prescribed using weekly pillbox - monitor blood glucose as directed by Endocrinologist  Note patient using FreeStyle  Libre 2 continuous glucose monitor - check blood pressure, document, and provide at future appointments - attend medical appointments as scheduled Next appointment with Nephrology on 7/14 Next appointment with PCP on 9/14 Next appointment with Endocrinology on 9/19  Follow Up Plan: Telephone follow up appointment with care management team member scheduled for: 7/20 at 12:30 pm      Medication Assistance: Approved to receive Novolog and Levemir from [employer] patient assistance program through 12/19/2021  Patient's preferred pharmacy is:  [facility] [address], [place] [address] Phone: [phone] Fax: [place], [place] [address] [place] [address] Phone: [phone] Fax: [place], [place] [address] Phone: [phone] Fax: [phone]  Uses pill box? Yes   Follow Up:  Patient agrees to Care Plan and Follow-up.  Stephen White, PharmD, Stephen White, CPP Clinical Pharmacist [facility] [phone]

## 2021-06-23 ENCOUNTER — Ambulatory Visit: Payer: Medicare HMO

## 2021-06-23 ENCOUNTER — Other Ambulatory Visit: Payer: Self-pay

## 2021-07-06 ENCOUNTER — Other Ambulatory Visit: Payer: Self-pay | Admitting: Family Medicine

## 2021-07-06 DIAGNOSIS — I129 Hypertensive chronic kidney disease with stage 1 through stage 4 chronic kidney disease, or unspecified chronic kidney disease: Secondary | ICD-10-CM

## 2021-07-08 ENCOUNTER — Ambulatory Visit (INDEPENDENT_AMBULATORY_CARE_PROVIDER_SITE_OTHER): Payer: Medicare HMO | Admitting: Pharmacist

## 2021-07-08 ENCOUNTER — Telehealth: Payer: Self-pay

## 2021-07-08 DIAGNOSIS — I129 Hypertensive chronic kidney disease with stage 1 through stage 4 chronic kidney disease, or unspecified chronic kidney disease: Secondary | ICD-10-CM | POA: Diagnosis not present

## 2021-07-08 DIAGNOSIS — Z794 Long term (current) use of insulin: Secondary | ICD-10-CM

## 2021-07-08 DIAGNOSIS — N184 Chronic kidney disease, stage 4 (severe): Secondary | ICD-10-CM

## 2021-07-08 DIAGNOSIS — E1121 Type 2 diabetes mellitus with diabetic nephropathy: Secondary | ICD-10-CM | POA: Diagnosis not present

## 2021-07-08 NOTE — Patient Instructions (Signed)
Visit Information  PATIENT GOALS:  Goals Addressed             This Visit's Progress    Pharmacy Goals       Our goal A1c is less than 7%. This corresponds with fasting sugars less than 130 and 2 hour after meal sugars less than 180. Please continue to monitor home blood sugar.  Our goal bad cholesterol, or LDL, is less than 70. This is why it is important to continue taking your pravastatin.  Please check your home blood pressure, keep a log of the results and have this record for [place] to review during our next telephone appointment  Feel free to call me with any questions or concerns. I look forward to our next call!    Harlow Asa, PharmD, Para March, CPP Clinical Pharmacist [facility] [phone]        The patient verbalized understanding of instructions, educational materials, and care plan provided today and declined offer to receive copy of patient instructions, educational materials, and care plan.   Telephone follow up appointment with care management team member scheduled for: 08/12/2021 at 12:30 PM

## 2021-07-08 NOTE — Chronic Care Management (AMB) (Signed)
Chronic Care Management Pharmacy Note  07/08/2021 Name:  Stephen White MRN:  [id number] DOB:  1936/03/05   Subjective: Stephen White is an 85 y.o. year old male who is a primary patient of Olin Hauser, DO.  The CCM team was consulted for assistance with disease management and care coordination needs.    Engaged with patient by telephone for follow up visit in response to provider referral for pharmacy case management and/or care coordination services.   Consent to Services:  The patient was given information about Chronic Care Management services, agreed to services, and gave verbal consent prior to initiation of services.  Please see initial visit note for detailed documentation.   Patient Care Team: Olin Hauser, DO as PCP - General (Family Medicine) Eulogio Bear, MD as Consulting Physician (Ophthalmology) Gabriel Carina Betsey Holiday, MD as Physician Assistant (Endocrinology) Ralene Bathe, MD as Consulting Physician (Dermatology) Khari Mally, Virl Diamond, RPH-CPP as Pharmacist   Recent consult visits: Office Visit with [facility] on 7/14  Hospital visits: [facility] on 4/15 for left cataract surgery Patient seen at [facility] ED on 3/21 for shortness of breath.  Objective:  Lab Results  Component Value Date   CREATININE 2.31 (H) 03/09/2021   CREATININE 2.69 (H) 02/24/2021   CREATININE 2.62 (H) 09/08/2020    Lab Results  Component Value Date   HGBA1C 8.1 12/31/2020   Last diabetic Eye exam:  Lab Results  Component Value Date/Time   HMDIABEYEEXA Retinopathy (A) 01/08/2021 10:34 AM    Last diabetic Foot exam: No results found for: HMDIABFOOTEX      Component Value Date/Time   CHOL 152 08/01/2020 0838   CHOL 162 11/07/2018 0958   TRIG 68 08/01/2020 0838   HDL 56 08/01/2020 0838   HDL 54 11/07/2018 0958   CHOLHDL 2.7 08/01/2020 0838   LDLCALC 82 08/01/2020 0838    Hepatic Function Latest Ref Rng  & Units 09/08/2020 11/07/2018 12/05/2017  Total Protein 6.1 - 8.1 g/dL 6.6 6.6 6.4  Albumin 3.5 - 4.8 g/dL - 4.1 -  AST 10 - 35 U/L 19 16 18   ALT 9 - 46 U/L 17 17 17   Alk Phosphatase 39 - 117 IU/L - 55 -  Total Bilirubin 0.2 - 1.2 mg/dL 0.5 0.4 0.6    Social History   Tobacco Use  Smoking Status Former   Packs/day: 0.30   Years: 25.00   Pack years: 7.50   Types: Cigarettes   Quit date: 12/19/1978   Years since quitting: 42.5  Smokeless Tobacco Former   BP Readings from Last 3 Encounters:  04/24/21 133/60  04/03/21 138/67  03/13/21 (!) 155/66   Pulse Readings from Last 3 Encounters:  04/24/21 67  04/03/21 (!) 59  03/09/21 70   Wt Readings from Last 3 Encounters:  04/24/21 194 lb 6.4 oz (88.2 kg)  03/30/21 185 lb (83.9 kg)  03/13/21 195 lb (88.5 kg)    Assessment: Review of patient past medical history, allergies, medications, health status, including review of consultants reports, laboratory and other test data, was performed as part of comprehensive evaluation and provision of chronic care management services.   SDOH:  (Social Determinants of Health) assessments and interventions performed: none   CCM Care Plan  Allergies  Allergen Reactions   Prednisone     Raised "sugar level"    Medications Reviewed Today     Reviewed by Vella Raring, RPH-CPP (Pharmacist) on 07/08/21 at [id number]  Med List Status: <None>   Medication Order Taking? Sig Documenting Provider Last Dose Status Informant  albuterol (VENTOLIN HFA) 108 (90 Base) MCG/ACT inhaler [id number] Yes Inhale 2 puffs into the lungs every 4 (four) hours as needed for wheezing or shortness of breath. Olin Hauser, DO Taking Active   aspirin 81 MG tablet [id number] Yes Take 81 mg by mouth daily.  [provider] Taking Active            Med Note Emmaline Kluver May 05, 2015 12:22 PM) Received from: [place] [facility] ([place]  OP) [id number]  Apply to eye.  [provider]  Active   Continuous Blood Gluc Receiver (FREESTYLE LIBRE 2 READER) DEVI [id number]  by Does not apply route. [provider]  Active   Emollient (GOLD Thedore MinsSharlyne Pacas [id number]  Apply topically at bedtime. Diabetic lotion [provider]  Active   famotidine (PEPCID) 10 MG tablet [id number] Yes Take 1 tablet (10 mg total) by mouth daily. Olin Hauser, DO Taking Active   fluticasone (FLONASE) 50 MCG/ACT nasal spray [id number] No Place 2 sprays into both nostrils daily. Use for 4-6 weeks then stop and use seasonally or as needed.  Patient not taking: Reported on 07/08/2021   Olin Hauser, DO Not Taking Active   gabapentin (NEURONTIN) 300 MG capsule [id number] Yes Take 300 mg by mouth at bedtime as needed. [provider] Taking Active   hydrochlorothiazide (HYDRODIURIL) 25 MG tablet [id number] Yes TAKE 1 TABLET(25 MG) BY MOUTH DAILY [place] Ranger, Devonne Doughty, DO Taking Active   insulin aspart (NOVOLOG) 100 UNIT/ML FlexPen [id number] Yes Take NovoLog insulin 10-15 minutes BEFORE you eat.  Take NovoLog 40 units before breakfast Take NovoLog 15 units before lunch Take NovoLog 40 units before supper.  Adjust for CBG <70 following direction from Endocrinologist [provider] Taking Active   insulin detemir (LEVEMIR) 100 UNIT/ML FlexPen [id number] Yes Inject into the skin. Inject 15 Units subcutaneously nightly [provider] Taking Active   loratadine (CLARITIN) 10 MG tablet [id number] Yes Take 10 mg by mouth every other day as needed for allergies. [provider] Taking Active   losartan (COZAAR) 100 MG tablet [id number] Yes TAKE 1 TABLET(100 MG) BY MOUTH DAILY Olin Hauser, DO Taking Active   MULTIPLE VITAMIN PO [id number] Yes Take by mouth daily.  [provider] Taking Active            Med Note Emmaline Kluver May 05, 2015 12:22 PM)  Received from: [place] TEST test strip [id number]  1 each by Other route as needed.  [provider]  Active            Med Note Doristine Devoid   Fri Oct 03, 2015  9:40 AM) Received from: External Pharmacy  pravastatin (PRAVACHOL) 10 MG tablet [id number] Yes TAKE 1 TABLET BY MOUTH EVERY DAY Karamalegos, Devonne Doughty, DO Taking Active   vitamin B-12 (CYANOCOBALAMIN) 500 MCG tablet [id number] Yes Take 500 mcg by mouth daily. [provider] Taking Active             Patient Active Problem List   Diagnosis Date Noted   Chronic frontal sinusitis 11/17/2020   Flu vaccine need 10/16/2020   Episodic lightheadedness 04/01/2020   Type 2 diabetes mellitus with diabetic nephropathy, with long-term current use of insulin ([place]) 02/14/2019   Flatulence 11/03/2016   Multiple  actinic keratoses 05/05/2015   Arthropathia 05/05/2015   CAFL (chronic airflow limitation) ([place]) 05/05/2015   Impotence of organic origin 05/05/2015   Gastro-esophageal reflux disease without esophagitis 05/05/2015   Retinopathy 12/23/2014   Microalbuminuria 12/23/2014   CKD (chronic kidney disease), stage IV (HCC) 05/07/2010   Allergic rhinitis 05/14/2008   Benign hypertension with CKD (chronic kidney disease) stage IV ([place]) 10/18/2007   HLD (hyperlipidemia) 09/06/2007    Immunization History  Administered Date(s) Administered   Fluad Quad(high Dose 65+) 11/02/2019, 10/16/2020   Influenza, High Dose Seasonal PF 10/03/2015, 11/03/2016, 10/06/2017, 11/06/2018   PFIZER(Purple Top)SARS-COV-2 Vaccination 01/14/2020, 02/04/2020   Pneumococcal Conjugate-13 12/24/2014   Pneumococcal Polysaccharide-23 12/25/2012   Td 07/02/2015   Zoster, Live 12/25/2012    Conditions to be addressed/monitored: HTN, HLD, DMII, and CKD  Care Plan : PharmD - Medication Managment and Assistance  Updates made by Vella Raring, RPH-CPP since 07/08/2021 12:00 AM     Problem:  Disease Progression      Long-Range Goal: Disease Progression Prevented or Minimized   Start Date: 02/02/2021  Expected End Date: 05/03/2021  This Visit's Progress: On track  Recent Progress: On track  Priority: High  Note:   Current Barriers:  Financial Barriers in complicated patient with multiple medical conditions including T2DM, CKD, GERD, HTN, HLD; patient has [employer] and reports copay for [place] is cost prohibitive at this time Approved to receive Novolog and Levemir from [employer] patient assistance program through 12/19/2021  Pharmacist Clinical Goal(s):  Over the next 90 days, patient will achieve adherence to monitoring guidelines and medication adherence to achieve therapeutic efficacy through collaboration with PharmD and provider.   Interventions: 1:1 collaboration with Olin Hauser, DO regarding development and update of comprehensive plan of care as evidenced by provider attestation and co-signature Inter-disciplinary care team collaboration (see longitudinal plan of care) Perform chart review. Patient seen for Office Visit with [facility] on 7/14 Per encounter note, patient felt dizzy and sleepy, but blood pressure and blood sugar WNL. Patient advised to follow up with emergency room for further evaluation. Today patient reports did not go to ED following appointment as advised by Nephrologist. Instead reports had lunch, laid down for a bit and then felt better Patient denies need to be seen by providers for further evaluation of dizziness at this time. Reports has had similar episodes on an off and previously discussed with providers.  Encourage patient to follow up with PCP if dizziness continues or new/worsening symptoms Patient denies taking any new medications Caution patient to watch for dizziness/sedation with gabapentin Will send message to PCP Comprehensive medication review performed; medication  list updated in electronic medical record Identify patient in need of refill of albuterol inhaler. Patient reports will call pharmacy.  Hypertension Current regimen: HCTZ 25 mg daily Losartan 100 mg daily Reports recent home BP readings: Today: 139/65, HR 66 7/12: 134/62, HR 63 Counsel patient to monitor home BP monitoring, keep log and have record to review during medical appointment  Type 2 Diabetes Patient followed by [facility] End[facility] for management Uncontrolled; current treatment: Levemir: 15 units nightly NovoLog: 40 units before breakfast 15 units before lunch  40 units before supper Patient has verbalized understanding that if blood glucose <70 before meal, take 1/2 scheduled dose for Novolog, as directed by Endocrinologist Have counseled on importance of administering Novolog doses 10 minutes prior to meals Reports had an episode of hypoglycemia this afternoon (55 mg/dL). Reports treated with  1/2 glass of OJ and is about to have lunch Discuss s/s and how to manage hypoglycemia as well as prevention Again encourage patient to pick up glucose tablets to carry with him Patient using FreeStyle Libre 2 continuous glucose monitor Orders sensor refills from [employer] Target goal range: 80-180 mg/dL Reports alarms set to go off for readings outside of range: 70-240 mg/dL Reports continuous glucose monitor data from last 14-days: Average glucose: 170 Time in Target: Above: 40% In Target: 59% Below: 1% 94% of sensor data was captured over the last 2 weeks Again counsel on importance of having regular balanced meals, reviewing nutrition labels for carbohydrate content Reports also following guidance from Nephrologist Have counseled on importance of calling Endocrinology as needed for concerns related to his blood sugar   Hyperlipidemia Current regimen: pravastatin 10 mg daily Have counseled on importance of reviewing nutrition labels for cholesterol  content.   Patient Goals/Self-Care Activities Over the next 90 days, patient will:  - take medications as prescribed using weekly pillbox - monitor blood glucose as directed by Endocrinologist  Note patient using FreeStyle Libre 2 continuous glucose monitor - check blood pressure, document, and provide at future appointments - attend medical appointments as scheduled Next appointment with PCP on 9/14 Next appointment with Endocrinology on 9/19  Follow Up Plan: Telephone follow up appointment with care management team member scheduled for: 08/12/2021 at 12:30 PM      Patient's preferred pharmacy is:  [facility] [address] [place] [address] Phone: [phone] Fax: [place], [place] [address] [address] Phone: [phone] Fax: [place], [place] [address] Phone: [phone] Fax: [phone]  Uses pill box? Yes  Follow Up:  Patient agrees to Care Plan and Follow-up.  Wallace Cullens, PharmD, Para [place], CPP Clinical Pharmacist [facility] [phone]

## 2021-07-10 ENCOUNTER — Other Ambulatory Visit: Payer: Self-pay | Admitting: Family Medicine

## 2021-08-12 ENCOUNTER — Ambulatory Visit (INDEPENDENT_AMBULATORY_CARE_PROVIDER_SITE_OTHER): Payer: Medicare HMO | Admitting: Pharmacist

## 2021-08-12 DIAGNOSIS — I129 Hypertensive chronic kidney disease with stage 1 through stage 4 chronic kidney disease, or unspecified chronic kidney disease: Secondary | ICD-10-CM

## 2021-08-12 DIAGNOSIS — E1169 Type 2 diabetes mellitus with other specified complication: Secondary | ICD-10-CM | POA: Diagnosis not present

## 2021-08-12 DIAGNOSIS — E1121 Type 2 diabetes mellitus with diabetic nephropathy: Secondary | ICD-10-CM

## 2021-08-12 DIAGNOSIS — N184 Chronic kidney disease, stage 4 (severe): Secondary | ICD-10-CM

## 2021-08-12 DIAGNOSIS — E785 Hyperlipidemia, unspecified: Secondary | ICD-10-CM | POA: Diagnosis not present

## 2021-08-12 DIAGNOSIS — Z794 Long term (current) use of insulin: Secondary | ICD-10-CM

## 2021-08-12 NOTE — Patient Instructions (Signed)
Visit Information  PATIENT GOALS:  Goals Addressed             This Visit's Progress    Pharmacy Goals       Our goal A1c is less than 7%. This corresponds with fasting sugars less than 130 and 2 hour after meal sugars less than 180. Please continue to monitor home blood sugar.  Our goal bad cholesterol, or LDL, is less than 70. This is why it is important to continue taking your pravastatin.  Please check your home blood pressure, keep a log of the results and have this record for [place] to review during our next telephone appointment  Feel free to call me with any questions or concerns. I look forward to our next call!    Wallace Cullens, PharmD, Para [place], CPP Clinical Pharmacist [facility] [phone]        The patient verbalized understanding of instructions, educational materials, and care plan provided today and declined offer to receive copy of patient instructions, educational materials, and care plan.   Telephone follow up appointment with care management team member scheduled for: 9/21 at 1:15 pm

## 2021-08-12 NOTE — Chronic Care Management (AMB) (Signed)
Chronic Care Management Pharmacy Note  08/12/2021 Name:  Stephen White MRN:  [id number] DOB:  September 05, 1936   Subjective: Stephen White is an 85 y.o. year old male who is a primary patient of Olin Hauser, DO.  The CCM team was consulted for assistance with disease management and care coordination needs.    Engaged with patient by telephone for follow up visit in response to provider referral for pharmacy case management and/or care coordination services.   Consent to Services:  The patient was given information about Chronic Care Management services, agreed to services, and gave verbal consent prior to initiation of services.  Please see initial visit note for detailed documentation.   Patient Care Team: Olin Hauser, DO as PCP - General (Family Medicine) Eulogio Bear, MD as Consulting Physician (Ophthalmology) Gabriel Carina Betsey Holiday, MD as Physician Assistant (Endocrinology) Ralene Bathe, MD as Consulting Physician (Dermatology) Curley Spice Virl Diamond, RPH-CPP as Pharmacist  Recent office visits: None  Hospital visits: [facility] on 4/15 for left cataract surgery Patient seen at [facility] ED on 3/21 for shortness of breath.   Objective:  Lab Results  Component Value Date   CREATININE 2.31 (H) 03/09/2021   CREATININE 2.69 (H) 02/24/2021   CREATININE 2.62 (H) 09/08/2020    Lab Results  Component Value Date   HGBA1C 8.1 12/31/2020   Last diabetic Eye exam:  Lab Results  Component Value Date/Time   HMDIABEYEEXA Retinopathy (A) 01/08/2021 10:34 AM    Last diabetic Foot exam: No results found for: HMDIABFOOTEX      Component Value Date/Time   CHOL 152 08/01/2020 0838   CHOL 162 11/07/2018 0958   TRIG 68 08/01/2020 0838   HDL 56 08/01/2020 0838   HDL 54 11/07/2018 0958   CHOLHDL 2.7 08/01/2020 0838   LDLCALC 82 08/01/2020 0838    Hepatic Function Latest Ref Rng & Units 09/08/2020 11/07/2018 12/05/2017  Total Protein  6.1 - 8.1 g/dL 6.6 6.6 6.4  Albumin 3.5 - 4.8 g/dL - 4.1 -  AST 10 - 35 U/L _0 ALT 9 - 46 U/L _1 Alk Phosphatase 39 - 117 IU/L - 55 -  Total Bilirubin 0.2 - 1.2 mg/dL 0.5 0.4 0.6    Social History   Tobacco Use  Smoking Status Former   Packs/day: 0.30   Years: 25.00   Pack years: 7.50   Types: Cigarettes   Quit date: 12/19/1978   Years since quitting: 42.6  Smokeless Tobacco Former   BP Readings from Last 3 Encounters:  04/24/21 133/60  04/03/21 138/67  03/13/21 (!) 155/66   Pulse Readings from Last 3 Encounters:  04/24/21 67  04/03/21 (!) 59  03/09/21 70   Wt Readings from Last 3 Encounters:  04/24/21 194 lb 6.4 oz (88.2 kg)  03/30/21 185 lb (83.9 kg)  03/13/21 195 lb (88.5 kg)    Assessment: Review of patient past medical history, allergies, medications, health status, including review of consultants reports, laboratory and other test data, was performed as part of comprehensive evaluation and provision of chronic care management services.   SDOH:  (Social Determinants of Health) assessments and interventions performed: none   CCM Care Plan  Allergies  Allergen Reactions   Prednisone     Raised "sugar level"    Medications Reviewed Today     Reviewed by Vella Raring, RPH-CPP (Pharmacist) on 07/08/21 at 1442  Med List Status: <None>   Medication Order  Taking? Sig Documenting Provider Last Dose Status Informant  albuterol (VENTOLIN HFA) 108 (90 Base) MCG/ACT inhaler [id number] Yes Inhale 2 puffs into the lungs every 4 (four) hours as needed for wheezing or shortness of breath. Olin Hauser, DO Taking Active   aspirin 81 MG tablet [id number] Yes Take 81 mg by mouth daily.  [provider] Taking Active            Med Note Emmaline Kluver May 05, 2015 12:22 PM) Received from: [place] [facility] ([facility]) [id number]  Apply to eye.  [provider]   Active   Continuous Blood Gluc Receiver (FREESTYLE LIBRE 2 READER) DEVI [id number]  by Does not apply route. [provider]  Active   Emollient (GOLD Thedore MinsSharlyne Pacas [id number]  Apply topically at bedtime. Diabetic lotion [provider]  Active   famotidine (PEPCID) 10 MG tablet [id number] Yes Take 1 tablet (10 mg total) by mouth daily. Olin Hauser, DO Taking Active   fluticasone (FLONASE) 50 MCG/ACT nasal spray [id number] No Place 2 sprays into both nostrils daily. Use for 4-6 weeks then stop and use seasonally or as needed.  Patient not taking: Reported on 07/08/2021   Olin Hauser, DO Not Taking Active   gabapentin (NEURONTIN) 300 MG capsule [id number] Yes Take 300 mg by mouth at bedtime as needed. [provider] Taking Active   hydrochlorothiazide (HYDRODIURIL) 25 MG tablet [id number] Yes TAKE 1 TABLET(25 MG) BY MOUTH DAILY Parks Ranger, Devonne Doughty, DO Taking Active   insulin aspart (NOVOLOG) 100 UNIT/ML FlexPen [id number] Yes Take NovoLog insulin 10-15 minutes BEFORE you eat.  Take NovoLog 40 units before breakfast Take NovoLog 15 units before lunch Take NovoLog 40 units before supper.  Adjust for CBG <70 following direction from Endocrinologist [provider] Taking Active   insulin detemir (LEVEMIR) 100 UNIT/ML FlexPen [id number] Yes Inject into the skin. Inject 15 Units subcutaneously nightly [provider] Taking Active   loratadine (CLARITIN) 10 MG tablet [id number] Yes Take 10 mg by mouth every other day as needed for allergies. [provider] Taking Active   losartan (COZAAR) 100 MG tablet [id number] Yes TAKE 1 TABLET(100 MG) BY MOUTH DAILY Olin Hauser, DO Taking Active   MULTIPLE VITAMIN PO [id number] Yes Take by mouth daily.  [provider] Taking Active            Med Note Emmaline Kluver May 05, 2015 12:22 PM) Received from: [place] TEST test strip [id number]  1 each by Other route as needed.  [provider]  Active            Med Note Doristine Devoid   Fri Oct 03, 2015  9:40 AM) Received from: External Pharmacy  pravastatin (PRAVACHOL) 10 MG tablet [id number] Yes TAKE 1 TABLET BY MOUTH EVERY DAY Karamalegos, Devonne Doughty, DO Taking Active   vitamin B-12 (CYANOCOBALAMIN) 500 MCG tablet [id number] Yes Take 500 mcg by mouth daily. [provider] Taking Active             Patient Active Problem List   Diagnosis Date Noted   Chronic frontal sinusitis 11/17/2020   Flu vaccine need 10/16/2020   Episodic lightheadedness 04/01/2020   Type 2 diabetes mellitus with diabetic nephropathy, with long-term current use of insulin ([place]) 02/14/2019   Flatulence 11/03/2016   Multiple actinic keratoses 05/05/2015   Arthropathia 05/05/2015  CAFL (chronic airflow limitation) ([place]) 05/05/2015   Impotence of organic origin 05/05/2015   Gastro-esophageal reflux disease without esophagitis 05/05/2015   Retinopathy 12/23/2014   Microalbuminuria 12/23/2014   CKD (chronic kidney disease), stage IV (HCC) 05/07/2010   Allergic rhinitis 05/14/2008   Benign hypertension with CKD (chronic kidney disease) stage IV ([place]) 10/18/2007   HLD (hyperlipidemia) 09/06/2007    Immunization History  Administered Date(s) Administered   Fluad Quad(high Dose 65+) 11/02/2019, 10/16/2020   Influenza, High Dose Seasonal PF 10/03/2015, 11/03/2016, 10/06/2017, 11/06/2018   PFIZER(Purple Top)SARS-COV-2 Vaccination 01/14/2020, 02/04/2020   Pneumococcal Conjugate-13 12/24/2014   Pneumococcal Polysaccharide-23 12/25/2012   Td 07/02/2015   Zoster, Live 12/25/2012    Conditions to be addressed/monitored: HTN, HLD, DMII, and CKD  Care Plan : PharmD - Medication Managment and Assistance  Updates made by Rennis Petty, RPH-CPP since 08/12/2021 12:00 AM     Problem: Disease Progression      Long-Range Goal: Disease  Progression Prevented or Minimized   Start Date: 02/02/2021  Expected End Date: 05/03/2021  This Visit's Progress: On track  Recent Progress: On track  Priority: High  Note:   Current Barriers:  Financial Barriers in complicated patient with multiple medical conditions including T2DM, CKD, GERD, HTN, HLD; patient has [employer] and reports copay for [place] is cost prohibitive at this time Approved to receive Novolog and Levemir from [employer] patient assistance program through 12/19/2021 Fear of hypoglycemia  Pharmacist Clinical Goal(s):  Over the next 90 days, patient will achieve adherence to monitoring guidelines and medication adherence to achieve therapeutic efficacy through collaboration with PharmD and provider.   Interventions: 1:1 collaboration with Olin Hauser, DO regarding development and update of comprehensive plan of care as evidenced by provider attestation and co-signature Inter-disciplinary care team collaboration (see longitudinal plan of care) Reports "swimmy head" feeling improved since stopped biotin supplement  Hypertension Current regimen: HCTZ 25 mg daily Losartan 100 mg daily Reports recent home BP readings: 8/19: 142/67, HR 67 8/15: 144/64, HR 64 Counsel on BP monitoring technique, including making sure that he is waiting at least 30 minutes after caffeine (morning coffee) before checking Denies missed doses of medications Discuss importance of limiting salt/sodium Encourage patient to review nutrition labels for sodium content Counsel on impact of caffeine on BP Counsel patient to monitor home BP monitoring, keep log and have record to review during medical appointments, to call office if readings consistently >140/90 or new symptoms  Type 2 Diabetes Patient followed by [facility] for management Uncontrolled; current treatment: Levemir: 15 units nightly NovoLog: 40 units before breakfast 15  units before lunch  40 units before supper Patient verbalizes understanding that if blood glucose <70 before meal, take 1/2 scheduled dose for Novolog, as directed by [employer] on importance of administering Novolog doses 10 minutes prior to meals Patient using FreeStyle Libre 2 continuous glucose monitor Orders sensor refills from [employer] Target goal range: 80-180 mg/dL Reports alarms set to go off for readings outside of range: 70-240 mg/dL Reports continuous glucose monitor data from last 14-days: Average glucose: 176 Time in Target: Above: 45% In Target: 54% Below: 1% 90% of sensor data was captured over the last 2 weeks Again counsel on importance of having regular balanced meals, reviewing nutrition labels for carbohydrate content Reports also following guidance from Nephrologist Patient admits to recently occasionally taking fewer units than prescribed by Endocrinologist due to fear of lows Have counseled on s/s of low blood  sugar, prevention of lows and how to manage lows Discuss benefit of Freestyle Libre 2 monitor for low blood sugar alarm Counsel patient on importance of taking insulin regimen as directed by Endocrinologist and calling Endocrinology as needed for concerns related to his blood sugar  Reports interested in trying Ozempic again both for blood sugar control and impact on his weight. Reports previously stopped due to nose bleeds that he attributed to Ozempic, but since believes nose bleeds were caused by something else.  Encourage patient to follow up with Endocrinologist at upcoming appointment regarding interest in trying Ozempic again   Hyperlipidemia Current regimen: pravastatin 10 mg daily Have counseled on importance of reviewing nutrition labels for cholesterol content.   Patient Goals/Self-Care Activities Over the next 90 days, patient will:  - take medications as prescribed using weekly pillbox - monitor blood glucose as directed  by Endocrinologist  Note patient using FreeStyle Libre 2 continuous glucose monitor - check blood pressure, document, and provide at future appointments - attend medical appointments as scheduled Next appointment with PCP on 9/14 Next appointment with Endocrinology on 9/19  Follow Up Plan: Telephone follow up appointment with care management team member scheduled for: 9/21 at 1:15 pm      Patient's preferred pharmacy is:  [facility] [address], [place] - [place] [address] Phone: [phone] Fax: [place], [place] [address] [address] Phone: [phone] Fax: [place], [place] [address] Phone: [phone] Fax: [phone]  Uses pill box? Yes   Follow Up:  Patient agrees to Care Plan and Follow-up.  Wallace Cullens, PharmD, Para [place], CPP Clinical Pharmacist [facility] [phone]

## 2021-08-25 ENCOUNTER — Telehealth: Payer: Self-pay

## 2021-08-25 ENCOUNTER — Ambulatory Visit: Payer: Medicare HMO

## 2021-08-25 NOTE — Telephone Encounter (Signed)
This nurse attempted to call patient three times for telephonic AWV. Called at 1135, 1140, and 150.Left a message on the first call that I would call back in 5 minutes. The last 2 calls did not access the voicemail.

## 2021-09-02 ENCOUNTER — Encounter: Payer: Medicare HMO | Admitting: Family Medicine

## 2021-09-09 ENCOUNTER — Ambulatory Visit (INDEPENDENT_AMBULATORY_CARE_PROVIDER_SITE_OTHER): Payer: Medicare HMO | Admitting: Pharmacist

## 2021-09-09 DIAGNOSIS — I129 Hypertensive chronic kidney disease with stage 1 through stage 4 chronic kidney disease, or unspecified chronic kidney disease: Secondary | ICD-10-CM

## 2021-09-09 DIAGNOSIS — E1121 Type 2 diabetes mellitus with diabetic nephropathy: Secondary | ICD-10-CM

## 2021-09-09 DIAGNOSIS — Z794 Long term (current) use of insulin: Secondary | ICD-10-CM

## 2021-09-09 NOTE — Chronic Care Management (AMB) (Signed)
Chronic Care Management Pharmacy Note  09/09/2021 Name:  CALLIN ASHE MRN:  [id number] DOB:  [birth year]   Subjective: Stephen White is an 85 y.o. year old male who is a primary patient of Olin Hauser, DO.  The CCM team was consulted for assistance with disease management and care coordination needs.    Engaged with patient by telephone for follow up visit in response to provider referral for pharmacy case management and/or care coordination services.   Consent to Services:  The patient was given information about Chronic Care Management services, agreed to services, and gave verbal consent prior to initiation of services.  Please see initial visit note for detailed documentation.   Patient Care Team: Olin Hauser, DO as PCP - General (Family Medicine) Eulogio Bear, MD as Consulting Physician (Ophthalmology) Gabriel Carina Betsey Holiday, MD as Physician Assistant (Endocrinology) Ralene Bathe, MD as Consulting Physician (Dermatology) Curley Spice Virl Diamond, RPH-CPP as Pharmacist   Recent consult visits: Office Visit with [facility] Endocrinology on 9/19  Hospital visits: [facility] on 4/15 for left cataract surgery Patient seen at [facility] ED on 3/21 for shortness of breath.  Objective:  Lab Results  Component Value Date   CREATININE 2.31 (H) 03/09/2021   CREATININE 2.69 (H) 02/24/2021   CREATININE 2.62 (H) 09/08/2020    Lab Results  Component Value Date   HGBA1C 8.1 12/31/2020   Last diabetic Eye exam:  Lab Results  Component Value Date/Time   HMDIABEYEEXA Retinopathy (A) 01/08/2021 10:34 AM    Last diabetic Foot exam: No results found for: HMDIABFOOTEX      Component Value Date/Time   CHOL 152 08/01/2020 0838   CHOL 162 11/07/2018 0958   TRIG 68 08/01/2020 0838   HDL 56 08/01/2020 0838   HDL 54 11/07/2018 0958   CHOLHDL 2.7 08/01/2020 0838   LDLCALC 82 08/01/2020 0838    Hepatic Function Latest Ref Rng &  Units 09/08/2020 11/07/2018 12/05/2017  Total Protein 6.1 - 8.1 g/dL 6.6 6.6 6.4  Albumin 3.5 - 4.8 g/dL - 4.1 -  AST 10 - 35 U/L _0 ALT 9 - 46 U/L _1 Alk Phosphatase 39 - 117 IU/L - 55 -  Total Bilirubin 0.2 - 1.2 mg/dL 0.5 0.4 0.6    Social History   Tobacco Use  Smoking Status Former   Packs/day: 0.30   Years: 25.00   Pack years: 7.50   Types: Cigarettes   Quit date: 12/19/1978   Years since quitting: 42.7  Smokeless Tobacco Former   BP Readings from Last 3 Encounters:  04/24/21 133/60  04/03/21 138/67  03/13/21 (!) 155/66   Pulse Readings from Last 3 Encounters:  04/24/21 67  04/03/21 (!) 59  03/09/21 70   Wt Readings from Last 3 Encounters:  04/24/21 194 lb 6.4 oz (88.2 kg)  03/30/21 185 lb (83.9 kg)  03/13/21 195 lb (88.5 kg)    Assessment: Review of patient past medical history, allergies, medications, health status, including review of consultants reports, laboratory and other test data, was performed as part of comprehensive evaluation and provision of chronic care management services.   SDOH:  (Social Determinants of Health) assessments and interventions performed: none   CCM Care Plan  Allergies  Allergen Reactions   Prednisone     Raised "sugar level"    Medications Reviewed Today     Reviewed by Rennis Petty, RPH-CPP (Pharmacist) on 09/09/21 at 1502  Med  List Status: <None>   Medication Order Taking? Sig Documenting Provider Last Dose Status Informant  albuterol (VENTOLIN HFA) 108 (90 Base) MCG/ACT inhaler [id number]  Inhale 2 puffs into the lungs every 4 (four) hours as needed for wheezing or shortness of breath. Olin Hauser, DO  Active   aspirin 81 MG tablet [id number]  Take 81 mg by mouth daily.  [provider]  Active            Med Note Emmaline Kluver May 05, 2015 12:22 PM) Received from: [facility] ([place] [facility]) [id number]  Apply  to eye.  [provider]  Active   Continuous Blood Gluc Receiver (FREESTYLE LIBRE 2 READER) DEVI [id number]  by Does not apply route. [provider]  Active   Emollient (GOLD Thedore MinsSharlyne Pacas [id number]  Apply topically at bedtime. Diabetic lotion [provider]  Active   famotidine (PEPCID) 10 MG tablet [id number]  Take 1 tablet (10 mg total) by mouth daily. Karamalegos, Devonne Doughty, DO  Active   fluticasone (FLONASE) 50 MCG/ACT nasal spray [id number]  Place 2 sprays into both nostrils daily. Use for 4-6 weeks then stop and use seasonally or as needed.  Patient not taking: Reported on 07/08/2021   Olin Hauser, DO  Active   gabapentin (NEURONTIN) 300 MG capsule [id number] No Take 300 mg by mouth at bedtime as needed.  Patient not taking: Reported on 09/09/2021   [provider] Not Taking Active   hydrochlorothiazide (HYDRODIURIL) 25 MG tablet [id number] Yes TAKE 1 TABLET(25 MG) BY MOUTH DAILY [place] Ranger, Devonne Doughty, DO Taking Active   insulin aspart (NOVOLOG) 100 UNIT/ML FlexPen [id number] Yes Take NovoLog insulin 10-15 minutes BEFORE you eat.  Take NovoLog 40 units before breakfast Take NovoLog 15 units before lunch Take NovoLog 40 units before supper.  Adjust for CBG <70 following direction from Endocrinologist [provider] Taking Active   insulin detemir (LEVEMIR) 100 UNIT/ML FlexPen [id number] Yes Inject into the skin. Inject 15 Units subcutaneously nightly [provider] Taking Active   loratadine (CLARITIN) 10 MG tablet [id number]  Take 10 mg by mouth every other day as needed for allergies. [provider]  Active   losartan (COZAAR) 100 MG tablet [id number] Yes TAKE 1 TABLET(100 MG) BY MOUTH DAILY Olin Hauser, DO Taking Active   MULTIPLE VITAMIN PO [id number]  Take by mouth daily.  [provider]  Active            Med Note Emmaline Kluver May 05, 2015 12:22 PM) Received  from: [facility] TEST test strip [id number]  1 each by Other route as needed.  [provider]  Active            Med Note Doristine Devoid   Fri Oct 03, 2015  9:40 AM) Received from: External Pharmacy  pravastatin (PRAVACHOL) 10 MG tablet [id number] Yes TAKE 1 TABLET BY MOUTH EVERY DAY Karamalegos, Devonne Doughty, DO Taking Active   vitamin B-12 (CYANOCOBALAMIN) 500 MCG tablet [id number]  Take 500 mcg by mouth daily. [provider]  Active             Patient Active Problem List   Diagnosis Date Noted   Chronic frontal sinusitis 11/17/2020   Flu vaccine need 10/16/2020   Episodic lightheadedness 04/01/2020   Type 2 diabetes mellitus with diabetic nephropathy, with long-term current use of insulin ([place])  02/14/2019   Flatulence 11/03/2016   Multiple actinic keratoses 05/05/2015   Arthropathia 05/05/2015   CAFL (chronic airflow limitation) ([place]) 05/05/2015   Impotence of organic origin 05/05/2015   Gastro-esophageal reflux disease without esophagitis 05/05/2015   Retinopathy 12/23/2014   Microalbuminuria 12/23/2014   CKD (chronic kidney disease), stage IV (H[facility]) 05/07/2010   Allergic rhinitis 05/14/2008   Benign hypertension with CKD (chronic kidney disease) stage IV ([place]) 10/18/2007   HLD (hyperlipidemia) 09/06/2007    Immunization History  Administered Date(s) Administered   Fluad Quad(high Dose 65+) 11/02/2019, 10/16/2020   Influenza, High Dose Seasonal PF 10/03/2015, 11/03/2016, 10/06/2017, 11/06/2018   PFIZER(Purple Top)SARS-COV-2 Vaccination 01/14/2020, 02/04/2020   Pneumococcal Conjugate-13 12/24/2014   Pneumococcal Polysaccharide-23 12/25/2012   Td 07/02/2015   Zoster, Live 12/25/2012    Conditions to be addressed/monitored: HTN, HLD, DMII, and CKD  Care Plan : PharmD - Medication Managment and Assistance  Updates made by Rennis Petty, RPH-CPP since 09/09/2021 12:00 AM     Problem: Disease Progression       Long-Range Goal: Disease Progression Prevented or Minimized   Start Date: 02/02/2021  Expected End Date: 05/03/2021  Recent Progress: On track  Priority: High  Note:   Current Barriers:  Financial Barriers in complicated patient with multiple medical conditions including T2DM, CKD, GERD, HTN, HLD; patient has [employer] and reports copay for Impact is cost prohibitive at this time Approved to receive Novolog and Levemir from [employer] patient assistance program through 12/19/2021 Fear of hypoglycemia  Pharmacist Clinical Goal(s):  Over the next 90 days, patient will achieve adherence to monitoring guidelines and medication adherence to achieve therapeutic efficacy through collaboration with PharmD and provider.   Interventions: 1:1 collaboration with Olin Hauser, DO regarding development and update of comprehensive plan of care as evidenced by provider attestation and co-signature Inter-disciplinary care team collaboration (see longitudinal plan of care) Perform chart review Office Visit with [facility] Endocrinology on 9/19 A1C 8.4% on 9/19 Provider addressed with patient his concerns about risk of low blood sugars Provider advised patient to : - Adjust NovoLog to 40 units before the breakfast and supper meals. Continue 15 units before lunch meal.  - Scan the blood glucose at least 4 times daily.  - Bring Libre to each follow up appt. - Continue pravastatin.  - Continue regular dilated eye exams.  - Follow up in 12 weeks with labs prior.  Note patient missed appointment with PCP on 9/14. Patient reports received a call on 9/14 telling him that the appointment would be over the telephone instead Encourage patient to call office today to reschedule appointment with PCP Reports has been having "swimmy head" feeling again.  Reports happens most mornings (including today), but that he then lays down to rest and feels better in the  afternoon Denies any symptoms other than dizziness Denies recent falls. Discuss fall prevention Denies hypoglycemia or hypotension with dizziness Denies taking any medications, including OTC, other than those on his medication list Denies taking gabapentin recently Reports has had similar episodes on an off and previously discussed with providers.  Note patient calling office today to reschedule appointment with PCP. Encourage patient to follow up with PCP sooner if new/worsening symptoms  Type 2 Diabetes Patient followed by [facility] Endocrinology for management Uncontrolled; current treatment: Levemir: 15 units nightly NovoLog: 40 units before breakfast 15 units before lunch  40 units before supper Patient verbalizes understanding that if blood glucose <70 before meal, take 1/2  scheduled dose for Novolog, as directed by [employer] patient on importance of taking insulin regimen as directed by Endocrinologist and calling Endocrinology as needed for concerns related to his blood sugar Denies recent hypoglycemia Have counseled on s/s of low blood sugar, prevention of lows and how to manage lows Discuss benefit of Freestyle Libre 2 monitor for low blood sugar alarm Have counseled on importance of administering Novolog doses 10 minutes prior to meals Patient using FreeStyle Libre 2 continuous glucose monitor Orders sensor refills from [employer] Target goal range: 80-180 mg/dL Alarms set to go off for readings outside of range: 70-240 mg/dL Reports blood sugar reading now (before lunch):207 Again counsel on importance of having regular balanced meals, reviewing nutrition labels for carbohydrate content  Hypertension Current regimen: HCTZ 25 mg daily Losartan 100 mg daily Reports recent home BP readings: Today: 142/64, HR 72  9/13: 125/54, HR 79  Counsel patient to monitor home BP monitoring, keep log and have record to review during medical appointments, to  call office if readings consistently >140/90 or new symptoms    Hyperlipidemia Current regimen: pravastatin 10 mg daily Have counseled on importance of reviewing nutrition labels for cholesterol content.   Patient Goals/Self-Care Activities Over the next 90 days, patient will:  - take medications as prescribed using weekly pillbox - monitor blood glucose as directed by Endocrinologist  Note patient using FreeStyle Libre 2 continuous glucose monitor - check blood pressure, document, and provide at future appointments - attend medical appointments as scheduled Next appointment with PCP on 9/14 Next appointment with Endocrinology on 9/19  Follow Up Plan: Telephone follow up appointment with care management team member scheduled for: 10/19 at 12:30 pm      Patient's preferred pharmacy is:  [facility] [address] [place] [address] Phone: [phone] Fax: [place], [place] [address] [place] [address] Phone: [phone] Fax: [id number], [place] [address] Phone: [phone] Fax: [phone]   Follow Up:  Patient agrees to Care Plan and Follow-up.  Wallace Cullens, PharmD, Para [place], CPP Clinical Pharmacist [facility] [phone]

## 2021-09-09 NOTE — Patient Instructions (Signed)
Visit Information  PATIENT GOALS:  Goals Addressed             This Visit's Progress    Pharmacy Goals       Our goal A1c is less than 7%. This corresponds with fasting sugars less than 130 and 2 hour after meal sugars less than 180. Please continue to monitor home blood sugar.  Our goal bad cholesterol, or LDL, is less than 70. This is why it is important to continue taking your pravastatin.  Please check your home blood pressure, keep a log of the results and have this record for [place] to review during our next telephone appointment  Feel free to call me with any questions or concerns. I look forward to our next call!  Wallace Cullens, PharmD, Para March, CPP Clinical Pharmacist [facility] [phone]        The patient verbalized understanding of instructions, educational materials, and care plan provided today and declined offer to receive copy of patient instructions, educational materials, and care plan.   Telephone follow up appointment with care management team member scheduled for: 10/19 at 12:30 pm

## 2021-09-18 DIAGNOSIS — Z794 Long term (current) use of insulin: Secondary | ICD-10-CM

## 2021-09-18 DIAGNOSIS — I129 Hypertensive chronic kidney disease with stage 1 through stage 4 chronic kidney disease, or unspecified chronic kidney disease: Secondary | ICD-10-CM

## 2021-09-18 DIAGNOSIS — E1121 Type 2 diabetes mellitus with diabetic nephropathy: Secondary | ICD-10-CM

## 2021-09-18 DIAGNOSIS — N184 Chronic kidney disease, stage 4 (severe): Secondary | ICD-10-CM

## 2021-10-05 ENCOUNTER — Other Ambulatory Visit: Payer: Self-pay

## 2021-10-05 ENCOUNTER — Ambulatory Visit: Payer: Medicare HMO

## 2021-10-05 DIAGNOSIS — L570 Actinic keratosis: Secondary | ICD-10-CM

## 2021-10-05 MED ORDER — AMINOLEVULINIC ACID HCL 20 % EX SOLR
1.0000 "application " | Freq: Once | CUTANEOUS | Status: AC
  Administered 2021-10-05: 354 mg via TOPICAL

## 2021-10-05 NOTE — Progress Notes (Addendum)
1. AK (actinic keratosis) Face  Photodynamic therapy - Face Procedure discussed: discussed risks, benefits, side effects. and alternatives   Prep: site scrubbed/prepped with acetone   Location:  Face Number of lesions:  Multiple Type of treatment:  Blue light Aminolevulinic Acid (see MAR for details): Levulan Number of Levulan sticks used:  1 Incubation time (minutes):  60 Number of minutes under lamp:  6 Number of seconds under lamp:  40 Cooling:  Floor fan Outcome: patient tolerated procedure well with no complications   Post-procedure details: sunscreen applied and aftercare instructions given to patient    Aminolevulinic Acid HCl 20 % SOLR 354 mg - Face    Pt made it 6 minutes and 40 seconds under the light. When checking on pt after 3 minutes He stated that it was really burning but that he was able to keep going. Pts hands were shaking. After another 3 minutes I checked on pt again and after adjusting the fan he asked to stop. I turned off the light and moved the machine. Pts hands, arms, and legs were all shaking and pt stated that the burning was too much. Dr. Nehemiah Massed came into the room and advised to cool pt down with cold compresses and have him stay seated and to take his BP.   BP - 158/74  Took bp again after having pt sit for approx 10 minutes with cold compress - 156/68 - pt advised that this was normal for him.   At this time pt stated that he felt well enough to leave and that he had a ride home. I walked pt to car and informed his wife to keep an eye on him and have him take it easy the rest of the day.

## 2021-10-05 NOTE — Patient Instructions (Signed)

## 2021-10-07 ENCOUNTER — Ambulatory Visit (INDEPENDENT_AMBULATORY_CARE_PROVIDER_SITE_OTHER): Payer: Medicare HMO | Admitting: Pharmacist

## 2021-10-07 DIAGNOSIS — E1121 Type 2 diabetes mellitus with diabetic nephropathy: Secondary | ICD-10-CM

## 2021-10-07 DIAGNOSIS — E785 Hyperlipidemia, unspecified: Secondary | ICD-10-CM

## 2021-10-07 DIAGNOSIS — Z794 Long term (current) use of insulin: Secondary | ICD-10-CM

## 2021-10-07 DIAGNOSIS — E1169 Type 2 diabetes mellitus with other specified complication: Secondary | ICD-10-CM

## 2021-10-07 DIAGNOSIS — I129 Hypertensive chronic kidney disease with stage 1 through stage 4 chronic kidney disease, or unspecified chronic kidney disease: Secondary | ICD-10-CM

## 2021-10-07 NOTE — Chronic Care Management (AMB) (Signed)
Chronic Care Management Pharmacy Note  10/07/2021 Name:  Stephen White MRN:  [id number] DOB:  1936-08-13   Subjective: Stephen White is an 85 y.o. year old male who is a primary patient of Olin Hauser, DO.  The CCM team was consulted for assistance with disease management and care coordination needs.    Engaged with patient by telephone for follow up visit in response to provider referral for pharmacy case management and/or care coordination services.   Consent to Services:  The patient was given information about Chronic Care Management services, agreed to services, and gave verbal consent prior to initiation of services.  Please see initial visit note for detailed documentation.   Patient Care Team: Olin Hauser, DO as PCP - General (Family Medicine) Eulogio Bear, MD as Consulting Physician (Ophthalmology) Gabriel Carina Betsey Holiday, MD as Physician Assistant (Endocrinology) Ralene Bathe, MD as Consulting Physician (Dermatology) Curley Spice Virl Diamond, RPH-CPP as Pharmacist   Recent consult visits: Office Visit with [facility] on 10/13 Office Visit with [place] on 10/17    Objective:  Lab Results  Component Value Date   CREATININE 2.31 (H) 03/09/2021   CREATININE 2.69 (H) 02/24/2021   CREATININE 2.62 (H) 09/08/2020    Lab Results  Component Value Date   HGBA1C 8.1 12/31/2020   Last diabetic Eye exam:  Lab Results  Component Value Date/Time   HMDIABEYEEXA Retinopathy (A) 01/08/2021 10:34 AM    Last diabetic Foot exam: No results found for: HMDIABFOOTEX      Component Value Date/Time   CHOL 152 08/01/2020 0838   CHOL 162 11/07/2018 0958   TRIG 68 08/01/2020 0838   HDL 56 08/01/2020 0838   HDL 54 11/07/2018 0958   CHOLHDL 2.7 08/01/2020 0838   LDLCALC 82 08/01/2020 0838    Hepatic Function Latest Ref Rng & Units 09/08/2020 11/07/2018 12/05/2017  Total Protein 6.1 - 8.1 g/dL 6.6 6.6 6.4  Albumin  3.5 - 4.8 g/dL - 4.1 -  AST 10 - 35 U/L 19 16 18   ALT 9 - 46 U/L 17 17 17   Alk Phosphatase 39 - 117 IU/L - 55 -  Total Bilirubin 0.2 - 1.2 mg/dL 0.5 0.4 0.6    Social History   Tobacco Use  Smoking Status Former   Packs/day: 0.30   Years: 25.00   Pack years: 7.50   Types: Cigarettes   Quit date: 12/19/1978   Years since quitting: 42.8  Smokeless Tobacco Former   BP Readings from Last 3 Encounters:  04/24/21 133/60  04/03/21 138/67  03/13/21 (!) 155/66   Pulse Readings from Last 3 Encounters:  04/24/21 67  04/03/21 (!) 59  03/09/21 70   Wt Readings from Last 3 Encounters:  04/24/21 194 lb 6.4 oz (88.2 kg)  03/30/21 185 lb (83.9 kg)  03/13/21 195 lb (88.5 kg)    Assessment: Review of patient past medical history, allergies, medications, health status, including review of consultants reports, laboratory and other test data, was performed as part of comprehensive evaluation and provision of chronic care management services.   SDOH:  (Social Determinants of Health) assessments and interventions performed:    CCM Care Plan  Allergies  Allergen Reactions   Prednisone     Raised "sugar level"    Medications Reviewed Today     Reviewed by Rennis Petty, RPH-CPP (Pharmacist) on 10/07/21 at [place] List Status: <None>   Medication Order Taking? Sig Documenting Provider Last Dose  Status Informant  albuterol (VENTOLIN HFA) 108 (90 Base) MCG/ACT inhaler [id number]  Inhale 2 puffs into the lungs every 4 (four) hours as needed for wheezing or shortness of breath. Olin Hauser, DO  Active   aspirin 81 MG tablet [id number]  Take 81 mg by mouth daily.  [provider]  Active            Med Note Emmaline Kluver May 05, 2015 12:22 PM) Received from: [place] Sodium ([place] [facility]) [id number]  Apply to eye.  [provider]  Active   Continuous Blood Gluc Receiver (FREESTYLE LIBRE 2  READER) DEVI [id number]  by Does not apply route. [provider]  Active   Emollient (GOLD Thedore MinsSharlyne Pacas [id number]  Apply topically at bedtime. Diabetic lotion [provider]  Active   famotidine (PEPCID) 10 MG tablet [id number]  Take 1 tablet (10 mg total) by mouth daily. Karamalegos, Devonne Doughty, DO  Active   fluticasone (FLONASE) 50 MCG/ACT nasal spray [id number]  Place 2 sprays into both nostrils daily. Use for 4-6 weeks then stop and use seasonally or as needed.  Patient not taking: Reported on 07/08/2021   Olin Hauser, DO  Active   gabapentin (NEURONTIN) 300 MG capsule [id number]  Take 300 mg by mouth at bedtime as needed.  Patient not taking: Reported on 09/09/2021   [provider]  Active   hydrochlorothiazide (HYDRODIURIL) 25 MG tablet [id number] Yes TAKE 1 TABLET(25 MG) BY MOUTH DAILY [place], Devonne Doughty, DO Taking Active   insulin aspart (NOVOLOG) 100 UNIT/ML FlexPen [id number] Yes Take NovoLog insulin 10-15 minutes BEFORE you eat.  Take NovoLog 40 units before breakfast Take NovoLog 15 units before lunch Take NovoLog 40 units before supper.  Adjust for CBG <70 following direction from Endocrinologist [provider] Taking Active   insulin detemir (LEVEMIR) 100 UNIT/ML FlexPen [id number] Yes Inject into the skin. Inject 15 Units subcutaneously nightly [provider] Taking Active   loratadine (CLARITIN) 10 MG tablet [id number]  Take 10 mg by mouth every other day as needed for allergies. [provider]  Active   losartan (COZAAR) 100 MG tablet [id number] Yes TAKE 1 TABLET(100 MG) BY MOUTH DAILY Olin Hauser, DO Taking Active   MULTIPLE VITAMIN PO [id number]  Take by mouth daily.  [provider]  Active            Med Note Emmaline Kluver May 05, 2015 12:22 PM) Received from: [place] TEST test strip [id number]  1 each by Other route as  needed.  [provider]  Active            Med Note Doristine Devoid   Fri Oct 03, 2015  9:40 AM) Received from: External Pharmacy  pravastatin (PRAVACHOL) 10 MG tablet [id number] Yes TAKE 1 TABLET BY MOUTH EVERY DAY Karamalegos, Devonne Doughty, DO Taking Active   vitamin B-12 (CYANOCOBALAMIN) 500 MCG tablet [id number]  Take 500 mcg by mouth daily. [provider]  Active             Patient Active Problem List   Diagnosis Date Noted   Chronic frontal sinusitis 11/17/2020   Flu vaccine need 10/16/2020   Episodic lightheadedness 04/01/2020   Type 2 diabetes mellitus with diabetic nephropathy, with long-term current use of insulin ([place]) 02/14/2019   Flatulence 11/03/2016   Multiple actinic keratoses 05/05/2015   Arthropathia  05/05/2015   CAFL (chronic airflow limitation) (HCC) 05/05/2015   Impotence of organic origin 05/05/2015   Gastro-esophageal reflux disease without esophagitis 05/05/2015   Retinopathy 12/23/2014   Microalbuminuria 12/23/2014   CKD (chronic kidney disease), stage IV (HCC) 05/07/2010   Allergic rhinitis 05/14/2008   Benign hypertension with CKD (chronic kidney disease) stage IV ([place]) 10/18/2007   HLD (hyperlipidemia) 09/06/2007    Immunization History  Administered Date(s) Administered   Fluad Quad(high Dose 65+) 11/02/2019, 10/16/2020   Influenza, High Dose Seasonal PF 10/03/2015, 11/03/2016, 10/06/2017, 11/06/2018   PFIZER(Purple Top)SARS-COV-2 Vaccination 01/14/2020, 02/04/2020   Pneumococcal Conjugate-13 12/24/2014   Pneumococcal Polysaccharide-23 12/25/2012   Td 07/02/2015   Zoster, Live 12/25/2012    Conditions to be addressed/monitored: HTN, HLD, DMII, and CKD  Care Plan : PharmD - Medication Managment and Assistance  Updates made by Rennis Petty, RPH-CPP since 10/07/2021 12:00 AM     Problem: Disease Progression      Long-Range Goal: Disease Progression Prevented or Minimized   Start Date: 02/02/2021  Expected End  Date: 05/03/2021  This Visit's Progress: On track  Recent Progress: On track  Priority: High  Note:   Current Barriers:  Financial Barriers in complicated patient with multiple medical conditions including T2DM, CKD, GERD, HTN, HLD; patient has [employer] and reports copay for [place] is cost prohibitive at this time Approved to receive Novolog and Levemir from [employer] patient assistance program through 12/19/2021 Fear of hypoglycemia  Pharmacist Clinical Goal(s):  Over the next 90 days, patient will achieve adherence to monitoring guidelines and medication adherence to achieve therapeutic efficacy through collaboration with PharmD and provider.   Interventions: 1:1 collaboration with Olin Hauser, DO regarding development and update of comprehensive plan of care as evidenced by provider attestation and co-signature Inter-disciplinary care team collaboration (see longitudinal plan of care) Perform chart review Office Visit with [place] on 10/13 Office Visit with [place] on 10/17 Patient reports recently took at dose of Alka-Seltzer Plus Severe Cold and Cough.  Advise patient against taking this Alka-Seltzer Plus product due to additional aspirin content, antihistamine and nasal decongestant. Counsel patient on reviewing active ingredients on any product prior to taking and to check with provider or CM Pharmacist prior to starting new over the counter medications  Type 2 Diabetes Patient [facility] by [facility] End[facility]ology for management Uncontrolled; current treatment: Levemir: 15 units nightly NovoLog: 40 units before breakfast 15 units before lunch  40 units before supper Patient verbalizes understanding that if blood glucose <70 before meal, take 1/2 scheduled dose for Novolog, as directed by [employer] patient on importance of taking insulin regimen as directed by Endocrinologist and  calling Endocrinology as needed for concerns related to his blood sugar Have counseled on s/s of low blood sugar, prevention of lows and how to manage lows Patient using Freestyle Libre 2 monitor for low blood sugar alarm Have counseled on importance of administering Novolog doses within 10 minutes prior to meals Patient using FreeStyle Libre 2 continuous glucose monitor Orders sensor refills from [employer] Target goal range: 80-180 mg/dL Alarms set to go off for readings outside of range: 70-240 mg/dL Reports continuous glucose monitor data from last 14-days: Average glucose: 167 Time in Target: Above: 41% In Target: 56% Below: 3% 88% of sensor data was captured over the last 2 weeks Again counsel on importance of having regular balanced meals, reviewing nutrition labels for carbohydrate content  Hypertension Current regimen: HCTZ  25 mg daily Losartan 100 mg daily Reports recent home BP readings: Denies checking at home recently Blood Pressure in office at Nephrology Visit on 10/13: 130/72, HR 73 Counsel patient to monitor home BP monitoring, keep log and have record to review during medical appointments, to call office if readings consistently >140/90 or new symptoms   Hyperlipidemia Current regimen: pravastatin 10 mg daily Have counseled on importance of reviewing nutrition labels for cholesterol content.  Medication Assistance: Patient requests assistance with re-applying to receive Novolog and Levemir from [employer] patient assistance program for 2023 calendar year Will collaborate with CPhT Sharee Pimple [place] for assistance to patient with re-applying  Patient Goals/Self-Care Activities Over the next 90 days, patient will:  - take medications as prescribed using weekly pillbox - monitor blood glucose as directed by Endocrinologist  Note patient using FreeStyle Libre 2 continuous glucose monitor - check blood pressure, document, and provide at future appointments -  attend medical appointments as scheduled Next appointment with Endocrinology on 12/27 (lab work on 12/20)  Follow Up Plan: Telephone follow up appointment with care management team member scheduled for: 11/21 at 1:15 pm      Patient's preferred pharmacy is:  [facility] [address], [place] [address] Phone: [phone] Fax: [place], [place] [address] [address] Phone: [phone] Fax: [place] [id number] [place], [place] [address] Phone: [phone] Fax: [phone]   Follow Up:  Patient agrees to Care Plan and Follow-up.  Wallace Cullens, PharmD, Para [place], CPP Clinical Pharmacist [employer] [phone]

## 2021-10-07 NOTE — Patient Instructions (Signed)
Visit Information  PATIENT GOALS:  Goals Addressed             This Visit's Progress    Pharmacy Goals       Our goal A1c is less than 7%. This corresponds with fasting sugars less than 130 and 2 hour after meal sugars less than 180. Please continue to monitor home blood sugar.  Our goal bad cholesterol, or LDL, is less than 70. This is why it is important to continue taking your pravastatin.  Please check your home blood pressure, keep a log of the results and have this record for [place] to review during our next telephone appointment  Feel free to call me with any questions or concerns. I look forward to our next call!    Wallace Cullens, PharmD, Para March, CPP Clinical Pharmacist [facility] [phone]        The patient verbalized understanding of instructions, educational materials, and care plan provided today and declined offer to receive copy of patient instructions, educational materials, and care plan.   Telephone follow up appointment with care management team member scheduled for: 11/21 at 1:15 pm

## 2021-10-09 ENCOUNTER — Other Ambulatory Visit: Payer: Self-pay | Admitting: Family Medicine

## 2021-10-09 NOTE — Telephone Encounter (Signed)
Requested medications are due for refill today yes  Requested medications are on the active medication list yes  Last refill 07/10/21  Last visit 02/17/21  Future visit scheduled No, was asked to return in 6 months, Sept.  Notes to clinic Failed protocol due to no valid labs within 180 days for Cozaar and 360 days for statin. please assess.   Requested Prescriptions  Pending Prescriptions Disp Refills   pravastatin (PRAVACHOL) 10 MG tablet [Pharmacy Med Name: PRAVASTATIN 10MG  TABLETS] 90 tablet 2    Sig: TAKE 1 TABLET BY MOUTH EVERY DAY     Cardiovascular:  Antilipid - Statins Failed - 10/09/2021  7:09 AM      Failed - Total Cholesterol in normal range and within 360 days    Cholesterol, Total  Date Value Ref Range Status  11/07/2018 162 100 - 199 mg/dL Final   Cholesterol  Date Value Ref Range Status  08/01/2020 152 <200 mg/dL Final          Failed - LDL in normal range and within 360 days    LDL Cholesterol (Calc)  Date Value Ref Range Status  08/01/2020 82 mg/dL (calc) Final    Comment:    Reference range: <100 . Desirable range <100 mg/dL for primary prevention;   <70 mg/dL for patients with CHD or diabetic patients  with > or = 2 CHD risk factors. Marland Kitchen LDL-C is now calculated using the Martin-Hopkins  calculation, which is a validated novel method providing  better accuracy than the Friedewald equation in the  estimation of LDL-C.  Cresenciano Genre et al. Annamaria Helling. 9211;941(74): 2061-2068  (http://education.[employer].com/faq/FAQ164)           Failed - HDL in normal range and within 360 days    HDL  Date Value Ref Range Status  08/01/2020 56 > OR = 40 mg/dL Final  11/07/2018 54 >39 mg/dL Final          Failed - Triglycerides in normal range and within 360 days    Triglycerides  Date Value Ref Range Status  08/01/2020 68 <150 mg/dL Final          Passed - Patient is not pregnant      Passed - Valid encounter within last 12 months    Recent Outpatient  Visits           5 months ago Dyspnea on exertion   [facility] [place], Devonne Doughty, DO   7 months ago CKD (chronic kidney disease), stage IV [facility])   [place], DO   7 months ago Dyspnea on exertion   [place], DO   7 months ago Type 2 diabetes mellitus treated with insulin [facility])   [facility], Devonne Doughty, DO   10 months ago Type 2 diabetes mellitus treated with insulin [facility])   [facility] [place], Devonne Doughty, DO       Future Appointments             In 1 week Ralene Bathe, MD [place]             losartan (COZAAR) 100 MG tablet [Pharmacy Med Name: LOSARTAN 100MG  TABLETS] 90 tablet 0    Sig: TAKE 1 TABLET(100 MG) BY MOUTH DAILY     Cardiovascular:  Angiotensin Receptor Blockers Failed - 10/09/2021  7:09 AM      Failed - Cr in normal range and within  180 days    Creat  Date Value Ref Range Status  02/24/2021 2.69 (H) 0.70 - 1.11 mg/dL Final    Comment:    For patients >35 years of age, the reference limit for Creatinine is approximately 13% higher for people identified as African-American. .    Creatinine, Ser  Date Value Ref Range Status  03/09/2021 2.31 (H) 0.61 - 1.24 mg/dL Final          Failed - K in normal range and within 180 days    Potassium  Date Value Ref Range Status  03/09/2021 4.1 3.5 - 5.1 mmol/L Final          Passed - Patient is not pregnant      Passed - Last BP in normal range    BP Readings from Last 1 Encounters:  04/24/21 133/60          Passed - Valid encounter within last 6 months    Recent Outpatient Visits           5 months ago Dyspnea on exertion   [place], DO   7 months ago CKD (chronic kidney disease), stage IV [facility])   [place], DO   7 months ago Dyspnea on  exertion   [place], DO   7 months ago Type 2 diabetes mellitus treated with insulin [facility])   [place], DO   10 months ago Type 2 diabetes mellitus treated with insulin [facility])   [facility] Olin Hauser, DO       Future Appointments             In 1 week Ralene Bathe, MD [place]

## 2021-10-13 ENCOUNTER — Telehealth: Payer: Self-pay | Admitting: Pharmacy Technician

## 2021-10-13 DIAGNOSIS — Z596 Low income: Secondary | ICD-10-CM

## 2021-10-13 NOTE — Progress Notes (Signed)
[place] [facility])                                            [place] Team    10/13/2021  MAVIN DYKE June 14, 1936 [id number]  FOR 2023 RE ENROLLMENT                                      Medication Assistance Referral  Referral From: [facility] Embedded RPh Dorthula Perfect   Medication/Company: Levemir / [name] Patient application portion:  Education officer, museum portion: Faxed  to Dr. Gabriel Carina Provider address/fax verified via: Office website  Medication/Company: [employer] / [employer] Patient application portion:  Education officer, museum portion: Faxed  to Dr. Gabriel Carina Provider address/fax verified via: [employer]. Kol Consuegra, [place]  [phone]

## 2021-10-19 DIAGNOSIS — Z794 Long term (current) use of insulin: Secondary | ICD-10-CM

## 2021-10-19 DIAGNOSIS — E785 Hyperlipidemia, unspecified: Secondary | ICD-10-CM

## 2021-10-19 DIAGNOSIS — N184 Chronic kidney disease, stage 4 (severe): Secondary | ICD-10-CM

## 2021-10-19 DIAGNOSIS — E1121 Type 2 diabetes mellitus with diabetic nephropathy: Secondary | ICD-10-CM

## 2021-10-19 DIAGNOSIS — I129 Hypertensive chronic kidney disease with stage 1 through stage 4 chronic kidney disease, or unspecified chronic kidney disease: Secondary | ICD-10-CM

## 2021-10-19 DIAGNOSIS — E1169 Type 2 diabetes mellitus with other specified complication: Secondary | ICD-10-CM

## 2021-10-21 ENCOUNTER — Other Ambulatory Visit: Payer: Self-pay

## 2021-10-21 ENCOUNTER — Ambulatory Visit: Payer: Medicare HMO | Admitting: Dermatology

## 2021-10-21 DIAGNOSIS — H61001 Unspecified perichondritis of right external ear: Secondary | ICD-10-CM

## 2021-10-21 DIAGNOSIS — L578 Other skin changes due to chronic exposure to nonionizing radiation: Secondary | ICD-10-CM | POA: Diagnosis not present

## 2021-10-21 DIAGNOSIS — L570 Actinic keratosis: Secondary | ICD-10-CM | POA: Diagnosis not present

## 2021-10-21 NOTE — Progress Notes (Signed)
   Follow-Up Visit   Subjective  Stephen White is a 85 y.o. male who presents for the following: Actinic Keratosis (6 month follow up - LN2 x 20 04/2021, PDT 09/2021 but he had an adverse reaction at 4 minutes and he had to stop treatment).  The following portions of the chart were reviewed this encounter and updated as appropriate:   Tobacco  Allergies  Meds  Problems  Med Hx  Surg Hx  Fam Hx     Review of Systems:  No other skin or systemic complaints except as noted in HPI or Assessment and Plan.  Objective  Well appearing patient in no apparent distress; mood and affect are within normal limits.  A focused examination was performed including face, scalp. Relevant physical exam findings are noted in the Assessment and Plan.  Face (12) Erythematous thin papules/macules with gritty scale.   Right Ear Flesh colored papule   Assessment & Plan   Actinic Damage - chronic, secondary to cumulative UV radiation exposure/sun exposure over time - diffuse scaly erythematous macules with underlying dyspigmentation - Recommend daily broad spectrum sunscreen SPF 30+ to sun-exposed areas, reapply every 2 hours as needed.  - Recommend staying in the shade or wearing long sleeves, sun glasses (UVA+UVB protection) and wide brim hats (4-inch brim around the entire circumference of the hat). - Call for new or changing lesions.  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  Area has been tender.  Destruction of lesion - Right Ear Complexity: simple   Destruction method: cryotherapy   Informed consent: discussed and consent obtained   Timeout:  patient name, date of birth, surgical site, and procedure verified Lesion destroyed using liquid nitrogen: Yes   Region frozen until ice ball extended beyond lesion: Yes   Outcome: patient tolerated procedure well with no complications   Post-procedure details: wound care instructions given    Return in about 6 months (around 04/20/2022).  I, Ashok Cordia, CMA, am acting as scribe for Sarina Ser, MD . Documentation: I have reviewed the above documentation for accuracy and completeness, and I agree with the above.  Sarina Ser, MD

## 2021-10-21 NOTE — Patient Instructions (Signed)

## 2021-10-22 ENCOUNTER — Encounter: Payer: Self-pay | Admitting: Dermatology

## 2021-10-28 ENCOUNTER — Ambulatory Visit: Payer: Medicare HMO | Admitting: Pharmacist

## 2021-10-28 DIAGNOSIS — E1121 Type 2 diabetes mellitus with diabetic nephropathy: Secondary | ICD-10-CM

## 2021-10-28 NOTE — Chronic Care Management (AMB) (Signed)
Chronic Care Management Pharmacy Note  10/28/2021 Name:  Stephen White MRN:  [id number] DOB:  [birth year]   Subjective: Stephen White is an 85 y.o. year old male who is a primary patient of Olin Hauser, DO.  The CCM team was consulted for assistance with disease management and care coordination needs.    Engaged with patient by telephone for follow up visit in response to provider referral for pharmacy case management and/or care coordination services.   Consent to Services:  The patient was given information about Chronic Care Management services, agreed to services, and gave verbal consent prior to initiation of services.  Please see initial visit note for detailed documentation.   Patient Care Team: Olin Hauser, DO as PCP - General (Family Medicine) Eulogio Bear, MD as Consulting Physician (Ophthalmology) Gabriel Carina, Betsey Holiday, MD as Physician Assistant (Endocrinology) Ralene Bathe, MD as Consulting Physician (Dermatology) Curley Spice Virl Diamond, RPH-CPP as Pharmacist  Objective:  Lab Results  Component Value Date   CREATININE 2.31 (H) 03/09/2021   CREATININE 2.69 (H) 02/24/2021   CREATININE 2.62 (H) 09/08/2020    Lab Results  Component Value Date   HGBA1C 8.1 12/31/2020   Last diabetic Eye exam:  Lab Results  Component Value Date/Time   HMDIABEYEEXA Retinopathy (A) 01/08/2021 10:34 AM    Last diabetic Foot exam: No results found for: HMDIABFOOTEX    Social History   Tobacco Use  Smoking Status Former   Packs/day: 0.30   Years: 25.00   Pack years: 7.50   Types: Cigarettes   Quit date: 12/19/1978   Years since quitting: 42.8  Smokeless Tobacco Former   BP Readings from Last 3 Encounters:  04/24/21 133/60  04/03/21 138/67  03/13/21 (!) 155/66   Pulse Readings from Last 3 Encounters:  04/24/21 67  04/03/21 (!) 59  03/09/21 70   Wt Readings from Last 3 Encounters:  04/24/21 194 lb 6.4 oz (88.2 kg)  03/30/21 185 lb (83.9  kg)  03/13/21 195 lb (88.5 kg)    Assessment: Review of patient past medical history, allergies, medications, health status, including review of consultants reports, laboratory and other test data, was performed as part of comprehensive evaluation and provision of chronic care management services.   SDOH:  (Social Determinants of Health) assessments and interventions performed:    CCM Care Plan  Allergies  Allergen Reactions   Prednisone     Raised "sugar level"    Medications Reviewed Today     Reviewed by Ralene Bathe, MD (Physician) on 10/22/21 at [place] List Status: <None>   Medication Order Taking? Sig Documenting Provider Last Dose Status Informant  albuterol (VENTOLIN HFA) 108 (90 Base) MCG/ACT inhaler [id number] No Inhale 2 puffs into the lungs every 4 (four) hours as needed for wheezing or shortness of breath. Olin Hauser, DO Taking Active   aspirin 81 MG tablet [id number] No Take 81 mg by mouth daily.  [provider] Taking Active            Med Note Emmaline Kluver May 05, 2015 12:22 PM) Received from: [facility] Sodium ([place] OP) [id number] No Apply to eye.  [provider] Taking Active   Continuous Blood Gluc Receiver (FREESTYLE LIBRE 2 READER) DEVI [id number] No by Does not apply route. [provider] Taking Active   Emollient (GOLD BOND ULTIMATE) Sharlyne Pacas [id number] No Apply topically at bedtime. Diabetic lotion [provider]  Taking Active   famotidine (PEPCID) 10 MG tablet [id number] No Take 1 tablet (10 mg total) by mouth daily. Olin Hauser, DO Taking Active   fluticasone (FLONASE) 50 MCG/ACT nasal spray [id number] No Place 2 sprays into both nostrils daily. Use for 4-6 weeks then stop and use seasonally or as needed.  Patient not taking: Reported on 07/08/2021   Olin Hauser, DO Not Taking Active   gabapentin (NEURONTIN) 300 MG  capsule [id number] No Take 300 mg by mouth at bedtime as needed.  Patient not taking: Reported on 09/09/2021   [provider] Not Taking Active   hydrochlorothiazide (HYDRODIURIL) 25 MG tablet [id number] No TAKE 1 TABLET(25 MG) BY MOUTH DAILY Parks Ranger, Devonne Doughty, DO Taking Active   insulin aspart (NOVOLOG) 100 UNIT/ML FlexPen [id number] No Take NovoLog insulin 10-15 minutes BEFORE you eat.  Take NovoLog 40 units before breakfast Take NovoLog 15 units before lunch Take NovoLog 40 units before supper.  Adjust for CBG <70 following direction from Endocrinologist [provider] Taking Active   insulin detemir (LEVEMIR) 100 UNIT/ML FlexPen [id number] No Inject into the skin. Inject 15 Units subcutaneously nightly [provider] Taking Active   loratadine (CLARITIN) 10 MG tablet [id number] No Take 10 mg by mouth every other day as needed for allergies. [provider] Taking Active   losartan (COZAAR) 100 MG tablet [id number]  TAKE 1 TABLET(100 MG) BY MOUTH DAILY Olin Hauser, DO  Active   MULTIPLE VITAMIN PO [id number] No Take by mouth daily.  [provider] Taking Active            Med Note Emmaline Kluver May 05, 2015 12:22 PM) Received from: [facility] TEST test strip [id number] No 1 each by Other route as needed.  [provider] Taking Active            Med Note Doristine Devoid   Fri Oct 03, 2015  9:40 AM) Received from: External Pharmacy  pravastatin (PRAVACHOL) 10 MG tablet [id number]  TAKE 1 TABLET BY MOUTH EVERY DAY Karamalegos, Devonne Doughty, DO  Active   vitamin B-12 (CYANOCOBALAMIN) 500 MCG tablet [id number] No Take 500 mcg by mouth daily. [provider] Taking Active             Patient Active Problem List   Diagnosis Date Noted   Chronic frontal sinusitis 11/17/2020   Flu vaccine need 10/16/2020   Episodic lightheadedness 04/01/2020   Type 2  diabetes mellitus with diabetic nephropathy, with long-term current use of insulin ([place]) 02/14/2019   Flatulence 11/03/2016   Multiple actinic keratoses 05/05/2015   Arthropathia 05/05/2015   CAFL (chronic airflow limitation) ([place]) 05/05/2015   Impotence of organic origin 05/05/2015   Gastro-esophageal reflux disease without esophagitis 05/05/2015   Retinopathy 12/23/2014   Microalbuminuria 12/23/2014   CKD (chronic kidney disease), stage IV ([place]) 05/07/2010   Allergic rhinitis 05/14/2008   Benign hypertension with CKD (chronic kidney disease) stage IV ([place]) 10/18/2007   HLD (hyperlipidemia) 09/06/2007    Immunization History  Administered Date(s) Administered   Fluad Quad(high Dose 65+) 11/02/2019, 10/16/2020   Influenza, High Dose Seasonal PF 10/03/2015, 11/03/2016, 10/06/2017, 11/06/2018   PFIZER(Purple Top)SARS-COV-2 Vaccination 01/14/2020, 02/04/2020   Pneumococcal Conjugate-13 12/24/2014   Pneumococcal Polysaccharide-23 12/25/2012   Td 07/02/2015   Zoster, Live 12/25/2012    Conditions to be addressed/monitored: HTN, HLD, and DMII  Care Plan : PharmD - Medication Managment and Assistance  Updates  made by Rennis Petty, RPH-CPP since 10/28/2021 12:00 AM     Problem: Disease Progression      Long-Range Goal: Disease Progression Prevented or Minimized   Start Date: 02/02/2021  Expected End Date: 05/03/2021  This Visit's Progress: On track  Recent Progress: On track  Priority: High  Note:   Current Barriers:  Financial Barriers in complicated patient with multiple medical conditions including T2DM, CKD, GERD, HTN, HLD; patient has [employer] and reports copay for [place] is cost prohibitive at this time Approved to receive Novolog and Levemir from [employer] patient assistance program through 12/19/2021 Fear of hypoglycemia  Pharmacist Clinical Goal(s):  Over the next 90 days, patient will achieve adherence to monitoring guidelines and  medication adherence to achieve therapeutic efficacy through collaboration with PharmD and provider.   Interventions: 1:1 collaboration with Olin Hauser, DO regarding development and update of comprehensive plan of care as evidenced by provider attestation and co-signature Inter-disciplinary care team collaboration (see longitudinal plan of care)  Medication Assistance: Collaborating with CPhT Sharee Pimple [place] for assistance to patient with re-applying for Novolog and Levemir from [employer] patient assistance program for 2023 calendar year Receive coordination of care message from [place] that she received patient's portion of [name] patient assistance program application for 6144, but did not receive an income document Follow up with patient today to request he send new income document to [place] for patient assistance application Patient and wife state that they will bring this by the office to be faxed to [place] with the office  Patient Goals/Self-Care Activities Over the next 90 days, patient will:  - take medications as prescribed using weekly pillbox - monitor blood glucose as directed by Endocrinologist  Note patient using FreeStyle Libre 2 continuous glucose monitor - check blood pressure, document, and provide at future appointments - attend medical appointments as scheduled Next appointment with Endocrinology on 12/27 (lab work on 12/20)  Follow Up Plan: Telephone follow up appointment with care management team member scheduled for: 11/21 at 1:15 pm      Patient's preferred pharmacy is:  [facility] [address], [place] [address] Phone: [phone] Fax: [place] [facility], [place] [address] [place] [address] Phone: [phone] Fax: [place] [address], [place] [address] Phone: [phone] Fax: [phone]   Follow Up:  Patient agrees to Care Plan and Follow-up.  Wallace Cullens, PharmD, [place], CPP Clinical Pharmacist [facility] [phone]

## 2021-10-28 NOTE — Patient Instructions (Signed)
Visit Information  Our goal A1c is less than 7%. This corresponds with fasting sugars less than 130 and 2 hour after meal sugars less than 180. Please continue to monitor home blood sugar.  Our goal bad cholesterol, or LDL, is less than 70. This is why it is important to continue taking your pravastatin.  Please check your home blood pressure, keep a log of the results and have this record for [place] to review during our next telephone appointment  Feel free to call me with any questions or concerns. I look forward to our next call!    Wallace Cullens, PharmD, [place], CPP Clinical Pharmacist [facility] [phone]  The patient verbalized understanding of instructions, educational materials, and care plan provided today and declined offer to receive copy of patient instructions, educational materials, and care plan.   Telephone follow up appointment with care management team member scheduled for: 1/21 at 1:15 pm

## 2021-10-29 LAB — HM DIABETES EYE EXAM

## 2021-11-09 ENCOUNTER — Telehealth: Payer: Medicare HMO

## 2021-11-09 ENCOUNTER — Telehealth: Payer: Self-pay | Admitting: Pharmacist

## 2021-11-09 NOTE — Telephone Encounter (Signed)
  Chronic Care Management   Outreach Note  11/09/2021 Name: Stephen White MRN: [id number] DOB: Oct 12, 1936  Referred by: Olin Hauser, DO Reason for referral : No chief complaint on file.   Reach patient today by telephone. Patient requests that we reschedule our appointment for today as he is busy caring for his spouse who is not well today  Follow Up Plan: CCM Pharmacist will outreach to patient again by telephone on 11/28 at 2 pm  Wallace Cullens, PharmD, [place] [facility] [phone]

## 2021-11-10 ENCOUNTER — Telehealth: Payer: Self-pay | Admitting: Pharmacy Technician

## 2021-11-10 DIAGNOSIS — Z596 Low income: Secondary | ICD-10-CM

## 2021-11-10 NOTE — Progress Notes (Signed)
[facility])                                            [place] Team    11/10/2021  Stephen White 06-16-1936 [id number]  Received both patient and provider portion(s) of patient assistance application(s) for Levemir and Novolog. Faxed completed application and required documents into [employer].    Stephen White P. [name], [place]  [phone]

## 2021-11-16 ENCOUNTER — Telehealth: Payer: Self-pay | Admitting: Pharmacist

## 2021-11-16 ENCOUNTER — Telehealth: Payer: Medicare HMO

## 2021-11-16 ENCOUNTER — Ambulatory Visit: Payer: Self-pay | Admitting: *Deleted

## 2021-11-16 NOTE — Telephone Encounter (Signed)
  Chronic Care Management   Outreach Note  11/16/2021 Name: Stephen White MRN: [id number] DOB: 1936-09-16  Referred by: Olin Hauser, DO Reason for referral : No chief complaint on file.   Speak with patient's wife who reports patient is not feeling well. Reports he has symptoms including sore throat. States they will call the office today to schedule an appointment for patient to speak with PCP.  Follow Up Plan: Reschedule appointment with CM Pharmacist as requested, for 12/12 at 1:15 pm  Wallace Cullens, PharmD, [place] [facility] [phone]

## 2021-11-16 NOTE — Telephone Encounter (Signed)
Per agent: "Patient experiencing sore throat, congestion unsure for how long. "    Pt reports sore throat and dry cough, onset Saturday. Reports 10/10 at times. Also reports dry cough. Unsure if febrile. Reports able to eat "But hurts bad afterwards. Also reports "Hurts to drink anything." States urinating as usual. Voice hoarse. Reports "Some SOB with coughing or moving around a lot."  Pt requesting "Z-Pack and something for cough " be called in. Advised would most likely need appt. None available within protocol timeframe. Assured pt NT would route to practice for PCPs review. After hours call. Home care advise given. Also advised ED for worsening symptoms. Pt verbalizes understanding.  Please advise: [phone]       Reason for Disposition  SEVERE (e.g., excruciating) throat pain  Answer Assessment - Initial Assessment Questions 1. ONSET: "When did the throat start hurting?" (Hours or days ago)      Saturday 2. SEVERITY: "How bad is the sore throat?" (Scale 1-10; mild, moderate or severe)   - MILD (1-3):  doesn't interfere with eating or normal activities   - MODERATE (4-7): interferes with eating some solids and normal activities   - SEVERE (8-10):  excruciating pain, interferes with most normal activities   - SEVERE DYSPHAGIA: can't swallow liquids, drooling     10/10 3. STREP EXPOSURE: "Has there been any exposure to strep within the past week?" If Yes, ask: "What type of contact occurred?"      no 4.  VIRAL SYMPTOMS: "Are there any symptoms of a cold, such as a runny nose, cough, hoarse voice or red eyes?"      Dry cough, hoarse voice  5. FEVER: "Do you have a fever?" If Yes, ask: "What is your temperature, how was it measured, and when did it start?"     Unsure 6. PUS ON THE TONSILS: "Is there pus on the tonsils in the back of your throat?"     Unsure 7. OTHER SYMPTOMS: "Do you have any other symptoms?" (e.g., difficulty breathing, headache, rash)     Dry cough,hoarse, SOB  at times with coughing and moving around.  Protocols used: Sore Throat-A-AH

## 2021-11-17 ENCOUNTER — Other Ambulatory Visit: Payer: Self-pay

## 2021-11-17 ENCOUNTER — Encounter: Payer: Self-pay | Admitting: Family Medicine

## 2021-11-17 ENCOUNTER — Ambulatory Visit (INDEPENDENT_AMBULATORY_CARE_PROVIDER_SITE_OTHER): Payer: Medicare HMO | Admitting: Family Medicine

## 2021-11-17 VITALS — Wt 194.0 lb

## 2021-11-17 DIAGNOSIS — J0110 Acute frontal sinusitis, unspecified: Secondary | ICD-10-CM | POA: Diagnosis not present

## 2021-11-17 MED ORDER — AZITHROMYCIN 250 MG PO TABS: ORAL_TABLET | ORAL | 0 refills | Status: DC

## 2021-11-17 MED ORDER — BENZONATATE 100 MG PO CAPS
100.0000 mg | ORAL_CAPSULE | Freq: Three times a day (TID) | ORAL | 0 refills | Status: DC | PRN
Start: 2021-11-17 — End: 2021-12-29

## 2021-11-17 MED ORDER — FLUTICASONE PROPIONATE 50 MCG/ACT NA SUSP
2.0000 | Freq: Every day | NASAL | 3 refills | Status: DC
Start: 2021-11-17 — End: 2022-02-26

## 2021-11-17 NOTE — Progress Notes (Signed)
Virtual Visit via Telephone The purpose of this virtual visit is to provide medical care while limiting exposure to the novel coronavirus (COVID19) for both patient and office staff.  Consent was obtained for phone visit:  Yes.   Answered questions that patient had about telehealth interaction:  Yes.   I discussed the limitations, risks, security and privacy concerns of performing an evaluation and management service by telephone. I also discussed with the patient that there may be a patient responsible charge related to this service. The patient expressed understanding and agreed to proceed.  Patient Location: Home Provider Location: [facility] (Office)  Participants in virtual visit: - Patient: "Stephen" Joycelyn White - CMA: Orinda Kenner, CMA - Provider: Dr Parks Ranger  ---------------------------------------------------------------------- Chief Complaint  Patient presents with   Sore Throat   Nasal Congestion    S: Reviewed CMA documentation. I have called patient and gathered additional HPI as follows:  Acute Sinusitis Sore Throat / Pharyngitis  Cough Reports that symptoms started 3-4 days ago with sick contact wife. No recent flu shot this season. Not tested for COVID Not tried OTC medication yet. He has done well with Zpak in past. He has taken some cough medicine and Flonase, off these currently  Denies any known or suspected exposure to person with or possibly with COVID19. Admits sore throat and cough Denies any fevers, chills, sweats, body ache, shortness of breath, sinus pain or pressure, headache, abdominal pain, diarrhea  Past Medical History:  Diagnosis Date   Actinic keratosis    Allergy    Basal cell carcinoma 08/29/2018   L post inf ear/excision   Basal cell carcinoma 08/05/2020   L lat forehead above the brow - ED[facility]   Chronic kidney disease    GERD (gastroesophageal reflux disease)    Hyperlipidemia    Hypertension    Squamous cell  carcinoma of skin 03/26/2015   L dorsal hand   Squamous cell carcinoma of skin 06/17/2015   R cheek   Squamous cell carcinoma of skin 05/10/2019   R hand dorsum/in situ   Squamous cell carcinoma of skin 08/05/2020   R of mideline forehead - ED[facility]   Thyroid disease    Type 2 diabetes mellitus ([place])    Vertigo    Wears hearing aid in both ears    Social History   Tobacco Use   Smoking status: Former    Packs/day: 0.30    Years: 25.00    Pack years: 7.50    Types: Cigarettes    Quit date: 12/19/1978    Years since quitting: 42.9   Smokeless tobacco: Former  Scientific laboratory technician Use: Never used  Substance Use Topics   Alcohol use: Yes    Alcohol/week: 0.0 standard drinks   Drug use: No    Current Outpatient Medications:    albuterol (VENTOLIN HFA) 108 (90 Base) MCG/ACT inhaler, Inhale 2 puffs into the lungs every 4 (four) hours as needed for wheezing or shortness of breath., Disp: 8 g, Rfl: 2   aspirin 81 MG tablet, Take 81 mg by mouth daily. , Disp: , Rfl:    azithromycin (ZITHROMAX Z-PAK) 250 MG tablet, Take 2 tabs (500mg  total) on Day 1. Take 1 tab (250mg ) daily for next 4 days., Disp: 6 tablet, Rfl: 0   benzonatate (TESSALON) 100 MG capsule, Take 1 capsule (100 mg total) by mouth 3 (three) times daily as needed for cough., Disp: 30 capsule, Rfl: 0   Carboxymethylcellulose Sodium (  EYE DROPS OP), Apply to eye. , Disp: , Rfl:    Continuous Blood Gluc Receiver (FREESTYLE LIBRE 2 READER) DEVI, by Does not apply route., Disp: , Rfl:    Emollient (GOLD BOND ULTIMATE) LOTN, Apply topically at bedtime. Diabetic lotion, Disp: , Rfl:    famotidine (PEPCID) 10 MG tablet, Take 1 tablet (10 mg total) by mouth daily., Disp: 90 tablet, Rfl: 0   insulin aspart (NOVOLOG) 100 UNIT/ML FlexPen, Take NovoLog insulin 10-15 minutes BEFORE you eat.  Take NovoLog 40 units before breakfast Take NovoLog 15 units before lunch Take NovoLog 40 units before supper.  Adjust for CBG <70 following direction from  Endocrinologist, Disp: , Rfl:    insulin detemir (LEVEMIR) 100 UNIT/ML FlexPen, Inject into the skin. Inject 15 Units subcutaneously nightly, Disp: , Rfl:    loratadine (CLARITIN) 10 MG tablet, Take 10 mg by mouth every other day as needed for allergies., Disp: , Rfl:    losartan (COZAAR) 100 MG tablet, TAKE 1 TABLET(100 MG) BY MOUTH DAILY, Disp: 90 tablet, Rfl: 1   MULTIPLE VITAMIN PO, Take by mouth daily. , Disp: , Rfl:    ONE TOUCH ULTRA TEST test strip, 1 each by Other route as needed. , Disp: , Rfl:    pravastatin (PRAVACHOL) 10 MG tablet, TAKE 1 TABLET BY MOUTH EVERY DAY, Disp: 90 tablet, Rfl: 1   vitamin B-12 (CYANOCOBALAMIN) 500 MCG tablet, Take 500 mcg by mouth daily., Disp: , Rfl:    fluticasone (FLONASE) 50 MCG/ACT nasal spray, Place 2 sprays into both nostrils daily. Use for 4-6 weeks then stop and use seasonally or as needed., Disp: 16 g, Rfl: 3   gabapentin (NEURONTIN) 300 MG capsule, Take 300 mg by mouth at bedtime as needed. (Patient not taking: Reported on 09/09/2021), Disp: , Rfl:    hydrochlorothiazide (HYDRODIURIL) 25 MG tablet, TAKE 1 TABLET(25 MG) BY MOUTH DAILY (Patient not taking: Reported on 11/17/2021), Disp: 90 tablet, Rfl: 1  Depression screen [facility] 2/9 11/17/2020 08/15/2020 08/12/2020  Decreased Interest 0 0 0  Down, Depressed, Hopeless 0 0 0  PHQ - 2 Score 0 0 0    No flowsheet data found.  -------------------------------------------------------------------------- O: No physical exam performed due to remote telephone encounter.  Lab results reviewed.  No results found for this or any previous visit (from the past 2160 hour(s)).  -------------------------------------------------------------------------- A&P:  Problem List Items Addressed This Visit   None Visit Diagnoses     Acute non-recurrent frontal sinusitis    -  Primary   Relevant Medications   fluticasone (FLONASE) 50 MCG/ACT nasal spray   benzonatate (TESSALON) 100 MG capsule   azithromycin  (ZITHROMAX Z-PAK) 250 MG tablet      Consistent with acute frontal rhinosinusitis, likely initially viral URI vs allergic rhinitis component  - At this time not consistent with bacterial infection, he has had similar course before  Plan: 1. Reassurance, likely self-limited - no indication for antibiotics at this time - Given I will be out of office Weds-Fri this week, I would suggest he may consider Zpak if not improved 48-72 hours. Sent rx he can leave at pharmacy or pick up and start only if not improved 2. Start Tessalon Perls take 1 capsule up to 3 times a day as needed for cough 3. Start nasal steroid Flonase 2 sprays in each nostril daily for 4-6 weeks, may repeat course seasonally or as needed 4. Return criteria reviewed   Meds ordered this encounter  Medications   fluticasone (FLONASE)  50 MCG/ACT nasal spray    Sig: Place 2 sprays into both nostrils daily. Use for 4-6 weeks then stop and use seasonally or as needed.    Dispense:  16 g    Refill:  3   benzonatate (TESSALON) 100 MG capsule    Sig: Take 1 capsule (100 mg total) by mouth 3 (three) times daily as needed for cough.    Dispense:  30 capsule    Refill:  0   azithromycin (ZITHROMAX Z-PAK) 250 MG tablet    Sig: Take 2 tabs (500mg  total) on Day 1. Take 1 tab (250mg ) daily for next 4 days.    Dispense:  6 tablet    Refill:  0    Follow-up: - Return in 1 week if unresolved  Patient verbalizes understanding with the above medical recommendations including the limitation of remote medical advice.  Specific follow-up and call-back criteria were given for patient to follow-up or seek medical care more urgently if needed.   - Time spent in direct consultation with patient on phone: 7 minutes  Nobie Putnam, [facility] 11/17/2021, 11:52 AM

## 2021-11-17 NOTE — Patient Instructions (Addendum)
1. Reassurance, likely self-limited - no indication for antibiotics at this time - Given I will be out of office Weds-Fri this week, I would suggest he may consider Zpak if not improved 48-72 hours. Sent rx he can leave at pharmacy or pick up and start only if not improved 2. Start Tessalon Perls take 1 capsule up to 3 times a day as needed for cough 3. Start nasal steroid Flonase 2 sprays in each nostril daily for 4-6 weeks, may repeat course seasonally or as needed 4. Return criteria reviewed  Please schedule a Follow-up Appointment to: Return if symptoms worsen or fail to improve.  If you have any other questions or concerns, please feel free to call the office or send a message through [place]. You may also schedule an earlier appointment if necessary.  Additionally, you may be receiving a survey about your experience at our office within a few days to 1 week by e-mail or mail. We value your feedback.  Nobie Putnam, DO [place]

## 2021-11-25 ENCOUNTER — Ambulatory Visit: Payer: Medicare HMO

## 2021-11-30 ENCOUNTER — Telehealth: Payer: Medicare HMO

## 2021-11-30 ENCOUNTER — Telehealth: Payer: Self-pay | Admitting: Pharmacist

## 2021-11-30 NOTE — Telephone Encounter (Signed)
  Chronic Care Management   Outreach Note  11/30/2021 Name: Stephen White MRN: [id number] DOB: [birth year]  Referred by: Olin Hauser, DO Reason for referral : No chief complaint on file.   Was unable to reach patient via telephone today and have left HIPAA compliant voicemail asking patient to return my call.    Follow Up Plan: Will collaborate with Care Guide to outreach to schedule follow up with me  Wallace Cullens, PharmD, [place] [facility] [phone]

## 2021-12-10 NOTE — Telephone Encounter (Signed)
Pt has been r/s  

## 2021-12-16 ENCOUNTER — Telehealth: Payer: Self-pay | Admitting: Pharmacy Technician

## 2021-12-16 DIAGNOSIS — Z596 Low income: Secondary | ICD-10-CM

## 2021-12-16 NOTE — Progress Notes (Addendum)
[facility])                                            [place] Team    12/16/2021  Stephen White [birth year] [id number]  Care coordination call placed to [facility] at [employer] in regard to [place] application.  Spoke to [place] who informed patient was APPROVED 12/20/21-12/19/22. She informs medications will begin to ship in 2023 based on last fill date in 2022 to the provider Dr. Joycie Peek office.  Stephen White, [place]  [phone]

## 2021-12-24 ENCOUNTER — Ambulatory Visit: Payer: Self-pay | Admitting: *Deleted

## 2021-12-24 NOTE — Telephone Encounter (Signed)
°  Chief Complaint: SOB- trouble breathing Symptoms: labored breathing, hard to have conversation Frequency: ongoing Pertinent Negatives: Patient denies chest pain Disposition: [x] ED /[] Urgent Care (no appt availability in office) / [] Appointment(In office/virtual)/ []  [facility]/ [] Home Care/ [] Refused Recommended Disposition /[] [place] [facility]/ []  Follow-up with PCP Additional Notes: Wife advise ED-911

## 2021-12-24 NOTE — Telephone Encounter (Signed)
Reason for Disposition  Severe difficulty breathing (e.g., struggling for each breath, speaks in single words)  Answer Assessment - Initial Assessment Questions 1. RESPIRATORY STATUS: "Describe your breathing?" (e.g., wheezing, shortness of breath, unable to speak, severe coughing)      Patient thinks he may be allergic to pork- not sure if that is causing SOB- ate bacon this morning 2. ONSET: "When did this breathing problem begin?"      Ongoing- labored 3. PATTERN "Does the difficult breathing come and go, or has it been constant since it started?"      Comes and goes 4. SEVERITY: "How bad is your breathing?" (e.g., mild, moderate, severe)    - MILD: No SOB at rest, mild SOB with walking, speaks normally in sentences, can lie down, no retractions, pulse < 100.    - MODERATE: SOB at rest, SOB with minimal exertion and prefers to sit, cannot lie down flat, speaks in phrases, mild retractions, audible wheezing, pulse 100-120.    - SEVERE: Very SOB at rest, speaks in single words, struggling to breathe, sitting hunched forward, retractions, pulse > 120      severe 5. RECURRENT SYMPTOM: "Have you had difficulty breathing before?" If Yes, ask: "When was the last time?" and "What happened that time?"       6. CARDIAC HISTORY: "Do you have any history of heart disease?" (e.g., heart attack, angina, bypass surgery, angioplasty)      *No Answer* 7. LUNG HISTORY: "Do you have any history of lung disease?"  (e.g., pulmonary embolus, asthma, emphysema)     CAFL 8. CAUSE: "What do you think is causing the breathing problem?"      Not sure 9. OTHER SYMPTOMS: "Do you have any other symptoms? (e.g., dizziness, runny nose, cough, chest pain, fever)     dizziness 10. O2 SATURATION MONITOR:  "Do you use an oxygen saturation monitor (pulse oximeter) at home?" If Yes, "What is your reading (oxygen level) today?" "What is your usual oxygen saturation reading?" (e.g., 95%)       no 11. PREGNANCY: "Is there any  chance you are pregnant?" "When was your last menstrual period?"       *No Answer* 12. TRAVEL: "Have you traveled out of the country in the last month?" (e.g., travel history, exposures)       *No Answer*  Protocols used: Breathing Difficulty-A-AH

## 2021-12-25 NOTE — Telephone Encounter (Signed)
Reviewed this request. He has not presented to Emergency Dept since they have called the call center yesterday 12/24/21.  It looks like they may be waiting on a call back from [place]?  I tried calling him after morning clinic around 1200 noon today 12/25/21. Did not reach patient.  Last time we talked we discussed using rescue inhaler Albuterol as needed, and if still problem - we were going to consider a Pulmonology consultation. However that takes time and would require referral and scheduling.  If he is short of breath and it is concerning - I agree there is not much else to do immediately other than get it checked out at Urgent Care or ED if it is severe difficulty breathing.  He has apt next week if it is improved we can talk next week.  Nobie Putnam, [facility] 12/25/2021, 12:05 PM

## 2021-12-29 ENCOUNTER — Ambulatory Visit: Payer: Medicare HMO | Admitting: Family Medicine

## 2021-12-29 ENCOUNTER — Encounter: Payer: Self-pay | Admitting: Family Medicine

## 2021-12-29 ENCOUNTER — Other Ambulatory Visit: Payer: Self-pay

## 2021-12-29 VITALS — BP 140/63 | HR 70 | Ht 67.0 in | Wt 196.0 lb

## 2021-12-29 DIAGNOSIS — J329 Chronic sinusitis, unspecified: Secondary | ICD-10-CM

## 2021-12-29 DIAGNOSIS — J310 Chronic rhinitis: Secondary | ICD-10-CM | POA: Diagnosis not present

## 2021-12-29 DIAGNOSIS — J4540 Moderate persistent asthma, uncomplicated: Secondary | ICD-10-CM

## 2021-12-29 DIAGNOSIS — R0609 Other forms of dyspnea: Secondary | ICD-10-CM | POA: Diagnosis not present

## 2021-12-29 DIAGNOSIS — Z23 Encounter for immunization: Secondary | ICD-10-CM | POA: Diagnosis not present

## 2021-12-29 MED ORDER — SYMBICORT 160-4.5 MCG/ACT IN AERO: 2.0000 | INHALATION_SPRAY | Freq: Two times a day (BID) | RESPIRATORY_TRACT | 12 refills | Status: DC

## 2021-12-29 MED ORDER — MONTELUKAST SODIUM 10 MG PO TABS: 10.0000 mg | ORAL_TABLET | Freq: Every day | ORAL | 3 refills | Status: DC

## 2021-12-29 MED ORDER — ALBUTEROL SULFATE HFA 108 (90 BASE) MCG/ACT IN AERS: 2.0000 | INHALATION_SPRAY | RESPIRATORY_TRACT | 2 refills | Status: DC | PRN

## 2021-12-29 NOTE — Progress Notes (Signed)
Subjective:    Patient ID: Stephen White, male    DOB: 30-Sep-1936, 86 y.o.   MRN: [id number]  Stephen White is a 86 y.o. male presenting on 12/29/2021 for Diabetes   HPI  Dyspnea / suspected Asthma moderate with reactive Sinusitis, allergies  Reports chronic problem now about 1 year, with dyspnea at rest and on exertion Has been previously evaluated for this back in March 2022, due to dyspnea and wheezing, ED visit hospital at that time, he did well on Albuterol PRN  Recent update, on 12/24/21 phone call triage to our office, for dyspnea, advised to go if worsening to ED / UC however symptoms did not change, he scheduled office visit for today.  Using Albuterol PRN some relief. But not always. Needs refill Albuterol Not on maintenance inhaler, never seen Pulm. No other concerning respiratory symptoms Not endorsing LE edema.  [facility] - followed by [name]. Recent history missed 1 apt due to the provider was unavailable due to illness and he missed one the other day.   Depression screen [facility] 2/9 11/17/2020 08/15/2020 08/12/2020  Decreased Interest 0 0 0  Down, Depressed, Hopeless 0 0 0  PHQ - 2 Score 0 0 0    Social History   Tobacco Use   Smoking status: Former    Packs/day: 0.30    Years: 25.00    Pack years: 7.50    Types: Cigarettes    Quit date: 12/19/1978    Years since quitting: 43.0   Smokeless tobacco: Former  Scientific laboratory technician Use: Never used  Substance Use Topics   Alcohol use: Yes    Alcohol/week: 0.0 standard drinks   Drug use: No    Review of Systems Per HPI unless specifically indicated above     Objective:    BP 140/63    Pulse 70    Ht 5\' 7"  (1.702 m)    Wt 196 lb (88.9 kg)    SpO2 100%    BMI 30.70 kg/m   Wt Readings from Last 3 Encounters:  12/29/21 196 lb (88.9 kg)  11/17/21 194 lb (88 kg)  04/24/21 194 lb 6.4 oz (88.2 kg)    Physical Exam Vitals and nursing note reviewed.  Constitutional:      General: He is not in  acute distress.    Appearance: He is well-developed. He is not diaphoretic.     Comments: Well-appearing, comfortable, cooperative  HENT:     Head: Normocephalic and atraumatic.  Eyes:     General:        Right eye: No discharge.        Left eye: No discharge.     Conjunctiva/sclera: Conjunctivae normal.  Neck:     Thyroid: No thyromegaly.  Cardiovascular:     Rate and Rhythm: Normal rate and regular rhythm.     Pulses: Normal pulses.     Heart sounds: Normal heart sounds. No murmur heard. Pulmonary:     Effort: Pulmonary effort is normal. No respiratory distress.     Breath sounds: Wheezing present. No rales.  Musculoskeletal:        General: Normal range of motion.     Cervical back: Normal range of motion and neck supple.  Lymphadenopathy:     Cervical: No cervical adenopathy.  Skin:    General: Skin is warm and dry.     Findings: No erythema or rash.  Neurological:     Mental Status: He is alert and  oriented to person, place, and time. Mental status is at baseline.  Psychiatric:        Behavior: Behavior normal.     Comments: Well groomed, good eye contact, normal speech and thoughts   Results for orders placed or performed in visit on 11/17/21  HM DIABETES EYE EXAM  Result Value Ref Range   HM Diabetic Eye Exam Retinopathy (A) No Retinopathy      Assessment & Plan:   Problem List Items Addressed This Visit   None Visit Diagnoses     Dyspnea on exertion    -  Primary   Relevant Medications   albuterol (VENTOLIN HFA) 108 (90 Base) MCG/ACT inhaler   SYMBICORT 160-4.5 MCG/ACT inhaler   Moderate persistent asthma without complication       Relevant Medications   albuterol (VENTOLIN HFA) 108 (90 Base) MCG/ACT inhaler   SYMBICORT 160-4.5 MCG/ACT inhaler   montelukast (SINGULAIR) 10 MG tablet   Chronic rhinosinusitis       Relevant Medications   montelukast (SINGULAIR) 10 MG tablet   Needs flu shot       Relevant Orders   Flu Vaccine QUAD High Dose(Fluad)  (Completed)       Suspected mild persistent asthma Bronchospasm New rx Inhaler for Breathing / Asthma - Symbicort - 2 puffs twice per day. Consider other options vs CCM pharmacy financial assist if indicated, has worked w/ Grayland Ormond previously  Re ordered the Albuterol rescue inhaler only as needed.  For allergies sinuses - start the Singulair montelukast allergy pill 10mg  nightly at bedtime  We can hold on Lung Specialist for now if this works. If it doesn't work we will need to refer.  CKD Follow up with Nephrology, to discuss his low energy concerns, see above, hopefully improved w/ treating respiratory issue.   Meds ordered this encounter  Medications   albuterol (VENTOLIN HFA) 108 (90 Base) MCG/ACT inhaler    Sig: Inhale 2 puffs into the lungs every 4 (four) hours as needed for wheezing or shortness of breath.    Dispense:  8 g    Refill:  2    Add refills on file   SYMBICORT 160-4.5 MCG/ACT inhaler    Sig: Inhale 2 puffs into the lungs 2 (two) times daily.    Dispense:  1 each    Refill:  12   montelukast (SINGULAIR) 10 MG tablet    Sig: Take 1 tablet (10 mg total) by mouth at bedtime.    Dispense:  90 tablet    Refill:  3      Follow up plan: Return in about 3 months (around 03/29/2022) for 3 month follow-up dyspnea, breathing, endocrine DM updates.   Nobie Putnam, [facility] 12/29/2021, 1:49 PM

## 2021-12-29 NOTE — Patient Instructions (Addendum)
Thank you for coming to the office today.  New rx Inhaler for Breathing / Asthma - Symbicort - 2 puffs twice per day.  If this new inhaler is very costly or we can't get it approved, we can switch to a different one or call [facility]land Ormond to assist.  Re ordered the Albuterol rescue inhaler only as needed.  For allergies sinuses - start the Singulair montelukast allergy pill 10mg  nightly at bedtime  We can hold on Lung Specialist for now if this works. If it doesn't work we will need to refer.  Ask your Kidney doctor about LOW ENERGY - they can test for Vitamin D or discuss how the kidney can lower energy such as Anemia low iron or other components.  Please schedule a Follow-up Appointment to: Return in about 3 months (around 03/29/2022) for 3 month follow-up dyspnea, breathing, endocrine DM updates.  If you have any other questions or concerns, please feel free to call the office or send a message through [place]. You may also schedule an earlier appointment if necessary.  Additionally, you may be receiving a survey about your experience at our office within a few days to 1 week by e-mail or mail. We value your feedback.  Nobie Putnam, DO [place]

## 2022-01-06 ENCOUNTER — Ambulatory Visit (INDEPENDENT_AMBULATORY_CARE_PROVIDER_SITE_OTHER): Payer: Medicare HMO | Admitting: Pharmacist

## 2022-01-06 DIAGNOSIS — J4540 Moderate persistent asthma, uncomplicated: Secondary | ICD-10-CM

## 2022-01-06 DIAGNOSIS — E1121 Type 2 diabetes mellitus with diabetic nephropathy: Secondary | ICD-10-CM

## 2022-01-06 DIAGNOSIS — Z794 Long term (current) use of insulin: Secondary | ICD-10-CM

## 2022-01-06 DIAGNOSIS — R0609 Other forms of dyspnea: Secondary | ICD-10-CM

## 2022-01-06 NOTE — Chronic Care Management (AMB) (Signed)
Chronic Care Management CCM Pharmacy Note  01/06/2022 Name:  ALESANDRO STUEVE MRN:  [id number] DOB:  September 28, 1936  Subjective: SOLACE WENDORFF is an 86 y.o. year old male who is a primary patient of Olin Hauser, DO.  The CCM team was consulted for assistance with disease management and care coordination needs.    Engaged with patient by telephone for follow up visit for pharmacy case management and/or care coordination services.   Objective:  Medications Reviewed Today     Reviewed by Rennis Petty, RPH-CPP (Pharmacist) on 01/06/22 at [place] List Status: <None>   Medication Order Taking? Sig Documenting Provider Last Dose Status Informant  albuterol (VENTOLIN HFA) 108 (90 Base) MCG/ACT inhaler [id number] Yes Inhale 2 puffs into the lungs every 4 (four) hours as needed for wheezing or shortness of breath. Olin Hauser, DO Taking Active   aspirin 81 MG tablet [id number]  Take 81 mg by mouth daily.  [provider]  Active            Med Note Emmaline Kluver May 05, 2015 12:22 PM) Received from: [facility] ([place] [facility]) [id number]  Apply to eye.  [provider]  Active   Continuous Blood Gluc Receiver (FREESTYLE LIBRE 2 READER) DEVI [id number]  by Does not apply route. [provider]  Active   Emollient (GOLD Thedore MinsSharlyne Pacas [id number]  Apply topically at bedtime. Diabetic lotion [provider]  Active   famotidine (PEPCID) 10 MG tablet [id number]  Take 1 tablet (10 mg total) by mouth daily. Karamalegos, Devonne Doughty, DO  Active   fluticasone (FLONASE) 50 MCG/ACT nasal spray [id number]  Place 2 sprays into both nostrils daily. Use for 4-6 weeks then stop and use seasonally or as needed. Olin Hauser, DO  Active   hydrochlorothiazide (HYDRODIURIL) 25 MG tablet [id number] Yes TAKE 1 TABLET(25 MG) BY MOUTH DAILY Parks Ranger, Devonne Doughty, DO Taking  Active   insulin aspart (NOVOLOG) 100 UNIT/ML FlexPen [id number]  Take NovoLog insulin 10-15 minutes BEFORE you eat.  Take NovoLog 40 units before breakfast Take NovoLog 15 units before lunch Take NovoLog 40 units before supper.  Adjust for CBG <70 following direction from Endocrinologist [provider]  Active   insulin detemir (LEVEMIR) 100 UNIT/ML FlexPen [id number]  Inject into the skin. Inject 15 Units subcutaneously nightly [provider]  Active   loratadine (CLARITIN) 10 MG tablet [id number] No Take 10 mg by mouth every other day as needed for allergies.  Patient not taking: Reported on 01/06/2022   [provider] Not Taking Active   losartan (COZAAR) 100 MG tablet [id number] Yes TAKE 1 TABLET(100 MG) BY MOUTH DAILY Karamalegos, Devonne Doughty, DO Taking Active   montelukast (SINGULAIR) 10 MG tablet [id number] Yes Take 1 tablet (10 mg total) by mouth at bedtime. Olin Hauser, DO Taking Active   MULTIPLE VITAMIN PO [id number]  Take by mouth daily.  [provider]  Active            Med Note Emmaline Kluver May 05, 2015 12:22 PM) Received from: [place] TEST test strip [id number]  1 each by Other route as needed.  [provider]  Active            Med Note Doristine Devoid   Fri Oct 03, 2015  9:40 AM) Received from: External Pharmacy  pravastatin (PRAVACHOL)  10 MG tablet [id number] Yes TAKE 1 TABLET BY MOUTH EVERY DAY Olin Hauser, DO Taking Active   SYMBICORT 160-4.5 MCG/ACT inhaler [id number] Yes Inhale 2 puffs into the lungs 2 (two) times daily. Olin Hauser, DO Taking Active   vitamin B-12 (CYANOCOBALAMIN) 500 MCG tablet [id number]  Take 500 mcg by mouth daily. [provider]  Active             Pertinent Labs:  Lab Results  Component Value Date   HGBA1C 8.1 12/31/2020   Lab Results  Component Value Date   CHOL 152 08/01/2020   HDL  56 08/01/2020   LDLCALC 82 08/01/2020   TRIG 68 08/01/2020   CHOLHDL 2.7 08/01/2020   Lab Results  Component Value Date   CREATININE 2.31 (H) 03/09/2021   BUN 49 (H) 03/09/2021   NA 137 03/09/2021   K 4.1 03/09/2021   CL 103 03/09/2021   CO2 26 03/09/2021    SDOH:  (Social Determinants of Health) assessments and interventions performed:    [place]  Review of patient past medical history, allergies, medications, health status, including review of consultants reports, laboratory and other test data, was performed as part of comprehensive evaluation and provision of chronic care management services.   Care Plan : PharmD - Medication Managment and Assistance  Updates made by Rennis Petty, RPH-CPP since 01/06/2022 12:00 AM     Problem: Disease Progression      Long-Range Goal: Disease Progression Prevented or Minimized   Start Date: 02/02/2021  Expected End Date: 05/03/2021  Recent Progress: On track  Priority: High  Note:   Current Barriers:  Financial Barriers in complicated patient with multiple medical conditions including T2DM, CKD, GERD, HTN, HLD; patient has [employer] and reports copay for [place] is cost prohibitive at this time Approved to receive Novolog and Levemir from [employer] patient assistance program through 12/19/2022 Fear of hypoglycemia  Pharmacist Clinical Goal(s):  Over the next 90 days, patient will achieve adherence to monitoring guidelines and medication adherence to achieve therapeutic efficacy through collaboration with PharmD and provider.   Interventions: 1:1 collaboration with Olin Hauser, DO regarding development and update of comprehensive plan of care as evidenced by provider attestation and co-signature Inter-disciplinary care team collaboration (see longitudinal plan of care) Perform chart review.  Patient seen for Office Visit with PCP on 12/29/2021. Provider advised patient: Rx sent for  new inhaler: Symbicort - 2 puffs twice per day Re ordered the Albuterol rescue inhaler only as needed For allergies sinuses - start the Singulair montelukast allergy pill 10mg  nightly at bedtime Can hold on Lung Specialist for now if this works. If it doesn't work we will need to refer Follow up with Nephrology, to discuss his low energy concerns Note appointment with [facility] scheduled for 2/9 Today patient reports he just returned from appointment at [facility] (encounter not yet available for review through shared record) Reports rescheduled from missed appointment on 1/5  Dyspnea / suspected Asthma (per PCP): Current treatment: Symbicort 160-4.5 mcg/act - 2 puffs twice daily Albuterol rescue inhaler as needed Montelukast 10 mg QHS Today counsel on Symbicort administration technique, particularly to ensure patient getting 2 full puffs of inhaler both in morning and evening. Also counsel patient on rinsing mouth out after each use of inhaler Counsel patient if breathing symptoms not improving or new symptoms, to contact PCP  Medication Assistance: Collaborating with CPhT Sharee Pimple Simcox for assistance  to patient with re-applying for Novolog and Levemir from [employer] patient assistance program for 2023 calendar year Received message from [place] per [employer], patient APPROVED 12/20/21-12/19/22 for re-enrollment in assistance program. Next supply to be shipped to patient based on last fill date in 2022 Patient interested in assistance with cost of Symbicort inhaler due to cost of medication through his Part D plan Based on reported income, patient meets criteria to apply for assistance from [employer] Will collaborate with Humphreys Simcox for aid to patient with applying to [employer] assistance program   Patient Goals/Self-Care Activities Over the next 90 days, patient will:  - take medications as prescribed using weekly pillbox -  monitor blood glucose as directed by Endocrinologist  Note patient using FreeStyle Libre 2 continuous glucose monitor - check blood pressure, document, and provide at future appointments - attend medical appointments as scheduled  Follow Up Plan: Telephone follow up appointment with care management team member scheduled for: 01/25/2022 at 10:15 AM       Wallace Cullens, PharmD, Para [place], [place] [phone]

## 2022-01-06 NOTE — Patient Instructions (Signed)
Visit Information  Thank you for taking time to visit with me today. Please don't hesitate to contact me if I can be of assistance to you before our next scheduled telephone appointment.  Following are the goals we discussed today:   Goals Addressed             This Visit's Progress    Pharmacy Goals       Our goal A1c is less than 7%. This corresponds with fasting sugars less than 130 and 2 hour after meal sugars less than 180. Please continue to monitor home blood sugar.  Our goal bad cholesterol, or LDL, is less than 70. This is why it is important to continue taking your pravastatin.  Please check your home blood pressure, keep a log of the results and have this record for [place] to review during our next telephone appointment  Feel free to call me with any questions or concerns. I look forward to our next call!   Wallace Cullens, PharmD, Para March, CPP Clinical Pharmacist [facility] [phone]         Our next appointment is by telephone on 01/25/2022 at 10:15 AM  Please call the care guide team at [phone] if you need to cancel or reschedule your appointment.    The patient verbalized understanding of instructions, educational materials, and care plan provided today and declined offer to receive copy of patient instructions, educational materials, and care plan.

## 2022-01-12 ENCOUNTER — Telehealth: Payer: Self-pay | Admitting: Pharmacy Technician

## 2022-01-12 DIAGNOSIS — Z596 Low income: Secondary | ICD-10-CM

## 2022-01-12 NOTE — Progress Notes (Signed)
[facility])                                            [place] Team    01/12/2022  CALEB DECOCK 1936/05/24 [id number]                                      Medication Assistance Referral  Referral From: [facility] Embedded RPh Dorthula Perfect   Medication/Company: Symbicort / [employer] Patient application portion:  Mailed Provider application portion: Faxed  to Dr. Parks Ranger Provider address/fax verified via: Office website    Arlethia Basso P. Tiberius Loftus, [place]  [phone]

## 2022-01-19 DIAGNOSIS — Z794 Long term (current) use of insulin: Secondary | ICD-10-CM

## 2022-01-19 DIAGNOSIS — E1121 Type 2 diabetes mellitus with diabetic nephropathy: Secondary | ICD-10-CM

## 2022-01-19 DIAGNOSIS — J4540 Moderate persistent asthma, uncomplicated: Secondary | ICD-10-CM

## 2022-01-25 ENCOUNTER — Ambulatory Visit (INDEPENDENT_AMBULATORY_CARE_PROVIDER_SITE_OTHER): Payer: Medicare HMO | Admitting: Pharmacist

## 2022-01-25 DIAGNOSIS — E1121 Type 2 diabetes mellitus with diabetic nephropathy: Secondary | ICD-10-CM

## 2022-01-25 DIAGNOSIS — J4540 Moderate persistent asthma, uncomplicated: Secondary | ICD-10-CM

## 2022-01-25 NOTE — Patient Instructions (Signed)
Visit Information  Thank you for taking time to visit with me today. Please don't hesitate to contact me if I can be of assistance to you before our next scheduled telephone appointment.  Following are the goals we discussed today:   Goals Addressed             This Visit's Progress    Pharmacy Goals       Our goal A1c is less than 7%. This corresponds with fasting sugars less than 130 and 2 hour after meal sugars less than 180. Please continue to monitor home blood sugar.  Our goal bad cholesterol, or LDL, is less than 70. This is why it is important to continue taking your pravastatin.  Please check your home blood pressure, keep a log of the results and have this record for [place] to review during our next telephone appointment  Feel free to call me with any questions or concerns. I look forward to our next call!    Wallace Cullens, PharmD, Para March, CPP Clinical Pharmacist [facility] [phone]         Our next appointment is by telephone on 3/13 at 10 am  Please call the care guide team at [phone] if you need to cancel or reschedule your appointment.    The patient verbalized understanding of instructions, educational materials, and care plan provided today and declined offer to receive copy of patient instructions, educational materials, and care plan.

## 2022-01-25 NOTE — Chronic Care Management (AMB) (Signed)
Chronic Care Management CCM Pharmacy Note  01/25/2022 Name:  [name] MRN:  [id number] DOB:  06-26-1936   Subjective: Stephen White is an 86 y.o. year old male who is a primary patient of Stephen Hauser, DO.  The CCM team was consulted for assistance with disease management and care coordination needs.    Engaged with patient by telephone for follow up visit for pharmacy case management and/or care coordination services.   Objective:  Medications Reviewed Today     Reviewed by Stephen White, RPH-CPP (Pharmacist) on 01/06/22 at [place] List Status: <None>   Medication Order Taking? Sig Documenting Provider Last Dose Status Informant  albuterol (VENTOLIN HFA) 108 (90 Base) MCG/ACT inhaler [id number] Yes Inhale 2 puffs into the lungs every 4 (four) hours as needed for wheezing or shortness of breath. Stephen Hauser, DO Taking Active   aspirin 81 MG tablet [id number]  Take 81 mg by mouth daily.  [provider]  Active            Med Note Stephen White May 05, 2015 12:22 PM) Received from: [place] [facility] ([place] [facility]) [id number]  Apply to eye.  [provider]  Active   Continuous Blood Gluc Receiver (FREESTYLE LIBRE 2 READER) DEVI [id number]  by Does not apply route. [provider]  Active   Emollient (GOLD Thedore MinsSharlyne Pacas [id number]  Apply topically at bedtime. Diabetic lotion [provider]  Active   famotidine (PEPCID) 10 MG tablet [id number]  Take 1 tablet (10 mg total) by mouth daily. Karamalegos, Devonne Doughty, DO  Active   fluticasone (FLONASE) 50 MCG/ACT nasal spray [id number]  Place 2 sprays into both nostrils daily. Use for 4-6 weeks then stop and use seasonally or as needed. Stephen Hauser, DO  Active   hydrochlorothiazide (HYDRODIURIL) 25 MG tablet [id number] Yes TAKE 1 TABLET(25 MG) BY MOUTH DAILY [place], Devonne Doughty, DO Taking  Active   insulin aspart (NOVOLOG) 100 UNIT/ML FlexPen [id number]  Take NovoLog insulin 10-15 minutes BEFORE you eat.  Take NovoLog 40 units before breakfast Take NovoLog 15 units before lunch Take NovoLog 40 units before supper.  Adjust for CBG <70 following direction from Endocrinologist [provider]  Active   insulin detemir (LEVEMIR) 100 UNIT/ML FlexPen [id number]  Inject into the skin. Inject 15 Units subcutaneously nightly [provider]  Active   loratadine (CLARITIN) 10 MG tablet [id number] No Take 10 mg by mouth every other day as needed for allergies.  Patient not taking: Reported on 01/06/2022   [provider] Not Taking Active   losartan (COZAAR) 100 MG tablet [id number] Yes TAKE 1 TABLET(100 MG) BY MOUTH DAILY Karamalegos, Devonne Doughty, DO Taking Active   montelukast (SINGULAIR) 10 MG tablet [id number] Yes Take 1 tablet (10 mg total) by mouth at bedtime. Stephen Hauser, DO Taking Active   MULTIPLE VITAMIN PO [id number]  Take by mouth daily.  [provider]  Active            Med Note Stephen White May 05, 2015 12:22 PM) Received from: [place] TEST test strip [id number]  1 each by Other route as needed.  [provider]  Active            Med Note Stephen White   Fri Oct 03, 2015  9:40 AM) Received from: External Pharmacy  pravastatin (  PRAVACHOL) 10 MG tablet [id number] Yes TAKE 1 TABLET BY MOUTH EVERY DAY Stephen Hauser, DO Taking Active   SYMBICORT 160-4.5 MCG/ACT inhaler [id number] Yes Inhale 2 puffs into the lungs 2 (two) times daily. Stephen Hauser, DO Taking Active   vitamin B-12 (CYANOCOBALAMIN) 500 MCG tablet [id number]  Take 500 mcg by mouth daily. [provider]  Active             Pertinent Labs:  Lab Results  Component Value Date   HGBA1C 8.1 12/31/2020  Latest A1C per shared record from [place]:  8.2% on 12/08/2021  Lab Results  Component Value Date   CHOL 152 08/01/2020   HDL 56 08/01/2020   LDLCALC 82 08/01/2020   TRIG 68 08/01/2020   CHOLHDL 2.7 08/01/2020   Lab Results  Component Value Date   CREATININE 2.31 (H) 03/09/2021   BUN 49 (H) 03/09/2021   NA 137 03/09/2021   K 4.1 03/09/2021   CL 103 03/09/2021   CO2 26 03/09/2021    SDOH:  (Social Determinants of Health) assessments and interventions performed:    [place]  Review of patient past medical history, allergies, medications, health status, including review of consultants reports, laboratory and other test data, was performed as part of comprehensive evaluation and provision of chronic care management services.   Care Plan : PharmD - Medication Managment and Assistance  Updates made by Stephen White, RPH-CPP since 01/25/2022 12:00 AM     Problem: Disease Progression      Long-Range Goal: Disease Progression Prevented or Minimized   Start Date: 02/02/2021  Expected End Date: 05/03/2021  Recent Progress: On track  Priority: High  Note:   Current Barriers:  Financial Barriers in complicated patient with multiple medical conditions including T2DM, CKD, GERD, HTN, HLD; patient has [employer] and reports copay for [place] is cost prohibitive at this time Approved to receive Novolog and Levemir from [employer] patient assistance program through 12/19/2022 Fear of hypoglycemia  Pharmacist Clinical Goal(s):  Over the next 90 days, patient will achieve adherence to monitoring guidelines and medication adherence to achieve therapeutic efficacy through collaboration with PharmD and provider.   Interventions: 1:1 collaboration with Stephen Hauser, DO regarding development and update of comprehensive plan of care as evidenced by provider attestation and co-signature Inter-disciplinary care team collaboration (see longitudinal plan of care) Perform chart review.  Patient  seen for Office Visit with [facility] Endocrinology on 01/06/2022 Last hemoglobin A1c was 8.2% on 12/08/2021 Provider advised stressed importance of compliance with NovoLog 40 units before the breakfast and supper meals. Continue 15 units before lunch meal. Reminded him again that NovoLog is only to be taken before meals and that again he is to not take it if not eating and not take it after eating Reports is working on getting hearing aid  Type 2 Diabetes Patient followed by [facility] Endocrinology for management Uncontrolled; current treatment: Levemir: 15 units nightly NovoLog: 40 units before breakfast 15 units before lunch  40 units before supper Patient verbalizes understanding that if blood glucose <70 before meal, take 1/2 scheduled dose for Novolog, as directed by [employer] patient on importance of taking insulin regimen as directed by Endocrinologist and calling Endocrinology as needed for concerns related to his blood sugar Have counseled on s/s of low blood sugar, prevention of lows and how to manage lows Patient using Freestyle Libre 2 monitor for low blood sugar alarm To  administer Novolog doses within 10 minutes prior to meals Patient using FreeStyle Libre 2 continuous glucose monitor Orders sensor refills from [employer] Target goal range: 80-180 mg/dL Alarms set to go off for readings outside of range: 70-240 mg/dL Reports continuous glucose monitor data from last 14-days: Average glucose: 172 Time in Target: Above: 44% In Target: 55% Below: 1% 93% of sensor data was captured over the last 2 weeks Again counsel on importance of administering Novolog doses within 10 minutes prior to meals  Dyspnea / suspected Asthma (per PCP): Current treatment: Symbicort 160-4.5 mcg/act - 2 puffs twice daily Albuterol rescue inhaler as needed Montelukast 10 mg QHS Counsel on using 2 full puffs of inhaler both in morning and evening. Patient confirms  rinsing mouth out after each use of inhaler Confirms using albuterol inhaler as needed for shortness of breath Reports has scheduled appointment with PCP to follow up regarding his breathing. Advise patient to call office sooner if needed for symptoms.  Medication Assistance: Note patient APPROVED 12/20/21-12/19/22 for re-enrollment for Novolog and Levemir from [employer] patient assistance program for 2023 calendar year Collaborating with CPhT Sharee Pimple [place] for assistance to patient with applying for patient assistance program for Symbicort from [employer] assistance program Patient to check mail for application from [place]   Patient Goals/Self-Care Activities Over the next 90 days, patient will:  - take medications as prescribed using weekly pillbox - monitor blood glucose as directed by Endocrinologist  Note patient using FreeStyle Libre 2 continuous glucose monitor - check blood pressure, document, and provide at future appointments - attend medical appointments as scheduled Next appointment with Nephrology on 2/9 Next appointment with PCP on 2/15 Next appointment with Endocrinology on 4/25  Follow Up Plan: Telephone follow up appointment with care management team member scheduled for: 3/13 at 10 am       Wallace Cullens, PharmD, [place], [place] [phone]

## 2022-01-28 DIAGNOSIS — E1122 Type 2 diabetes mellitus with diabetic chronic kidney disease: Secondary | ICD-10-CM | POA: Insufficient documentation

## 2022-01-29 ENCOUNTER — Ambulatory Visit: Payer: Self-pay | Admitting: *Deleted

## 2022-01-29 NOTE — Telephone Encounter (Signed)
Pt's wife calling. States pt saw "Kidney doctor" yesterday and was advised to see cardiologist "Sooner rather than later."  States pt has appt with cardiologist at [facility] Tuesday 02/02/22.  Questioning if this is soon enough or should she try for sooner appt with someone else. Reviewed nephrologist note, pt with SOB. Reviewed recommendations with wife as noted regarding fluid restrictions, 2G na diet. Assured NT would route to practice for PCPs review and final disposition. Advised ED for worsening symptoms. Please advise.        Reason for Disposition  [1] Caller requesting NON-URGENT health information AND [2] PCP's office is the best resource  Answer Assessment - Initial Assessment Questions 1. REASON FOR CALL or QUESTION: "What is your reason for calling today?" or "How can I best help you?" or "What question do you have that I can help answer?"     Cardiology appt Tuesday.  Protocols used: Information Only Call - No Triage-A-AH

## 2022-01-29 NOTE — Telephone Encounter (Signed)
Reviewed records.  Please notify him that 02/02/22 apt is fine to keep for Cardiology  Yes I agree ED if significant worsening breathing or fluid gain.  Nobie Putnam, [facility] 01/29/2022, 11:55 AM

## 2022-02-02 ENCOUNTER — Ambulatory Visit: Payer: Medicare HMO | Admitting: Family Medicine

## 2022-02-02 DIAGNOSIS — R0602 Shortness of breath: Secondary | ICD-10-CM | POA: Insufficient documentation

## 2022-02-03 ENCOUNTER — Ambulatory Visit: Payer: Medicare HMO | Admitting: Family Medicine

## 2022-02-03 ENCOUNTER — Encounter: Payer: Self-pay | Admitting: Family Medicine

## 2022-02-03 ENCOUNTER — Other Ambulatory Visit: Payer: Self-pay

## 2022-02-03 VITALS — BP 141/58 | HR 72 | Ht 67.0 in | Wt 190.8 lb

## 2022-02-03 DIAGNOSIS — J4540 Moderate persistent asthma, uncomplicated: Secondary | ICD-10-CM | POA: Diagnosis not present

## 2022-02-03 DIAGNOSIS — R0602 Shortness of breath: Secondary | ICD-10-CM

## 2022-02-03 MED ORDER — ANORO ELLIPTA 62.5-25 MCG/ACT IN AEPB: 1.0000 | INHALATION_SPRAY | Freq: Every day | RESPIRATORY_TRACT | 2 refills | Status: DC

## 2022-02-03 NOTE — Patient Instructions (Addendum)
Thank you for coming to the office today.  Referral today to [facility]- stay tuned for apt  [place], [place], [place] Phone: [phone]  Try the new inhaler Anoro - 1 puff per day to help control breathing.  Use albuterol rescue inhaler as needed  Stop Symbicort inhaler due to steroid bothering you  We will get results from [facility] Doctor soon, and will see what the ECHO shows on 3/1  If the new inhaler is expensive, then we can work with [place] going forward. But get the 1st one.  Please schedule a Follow-up Appointment to: Return in about 4 weeks (around 03/03/2022), or if symptoms worsen or fail to improve.  If you have any other questions or concerns, please feel free to call the office or send a message through [place]. You may also schedule an earlier appointment if necessary.  Additionally, you may be receiving a survey about your experience at our office within a few days to 1 week by e-mail or mail. We value your feedback.  Nobie Putnam, DO [place]

## 2022-02-03 NOTE — Progress Notes (Signed)
Subjective:    Patient ID: [name], male    DOB: August 22, 1936, 86 y.o.   MRN: [id number]  JAESHAUN RIVA is a 86 y.o. male presenting on 02/03/2022 for Annual Exam   HPI  Dyspnea, with and without exertion Chronic problem  Last visit with me 12/29/21 - added Singulair and Symbicort therapy, limited results Nephro on 01/28/22 - worried about CHF they sent to Cardiology Cardiology [facility] on 02/02/22 - CXR yesterday (waiting on results) and ECHO 3/1 to rule out CHF.  Still has shortness of breath difficulty talking, even today. He has declined to go to ED in past. But has been there months ago, back in 02/2021  Reports chronic problem now about 1 year, with dyspnea at rest and on exertion Has been previously evaluated for this back in March 2022, due to dyspnea and wheezing, ED visit hospital at that time, he did well on Albuterol PRN    Using Albuterol PRN some relief. But not always.  Not on maintenance inhaler, never seen Pulm. No other concerning respiratory symptoms Not endorsing LE edema.  Admits symbicort inhaler burning his throat and causing irritation  He has reaction to Steroids, cannot take prednisone  Depression screen [facility] 2/9 11/17/2020 08/15/2020 08/12/2020  Decreased Interest 0 0 0  Down, Depressed, Hopeless 0 0 0  PHQ - 2 Score 0 0 0    Social History   Tobacco Use   Smoking status: Former    Packs/day: 0.30    Years: 25.00    Pack years: 7.50    Types: Cigarettes    Quit date: 12/19/1978    Years since quitting: 43.1   Smokeless tobacco: Former  Scientific laboratory technician Use: Never used  Substance Use Topics   Alcohol use: Yes    Alcohol/week: 0.0 standard drinks   Drug use: No    Review of Systems Per HPI unless specifically indicated above     Objective:    BP (!) 141/58    Pulse 72    Ht 5\' 7"  (1.702 m)    Wt 190 lb 12.8 oz (86.5 kg)    SpO2 100%    BMI 29.88 kg/m   Wt Readings from Last 3 Encounters:  02/03/22 190 lb 12.8 oz (86.5 kg)   12/29/21 196 lb (88.9 kg)  11/17/21 194 lb (88 kg)    Physical Exam Vitals and nursing note reviewed.  Constitutional:      General: He is not in acute distress.    Appearance: He is well-developed. He is not diaphoretic.     Comments: Well-appearing, comfortable, cooperative  HENT:     Head: Normocephalic and atraumatic.  Eyes:     General:        Right eye: No discharge.        Left eye: No discharge.     Conjunctiva/sclera: Conjunctivae normal.  Neck:     Thyroid: No thyromegaly.  Cardiovascular:     Rate and Rhythm: Normal rate and regular rhythm.     Pulses: Normal pulses.     Heart sounds: Normal heart sounds. No murmur heard. Pulmonary:     Effort: Tachypnea present. No respiratory distress.     Breath sounds: No wheezing or rales.     Comments: Increased work of breathing even at rest  No crackles or fluid heard. No wheezing Musculoskeletal:        General: Normal range of motion.     Cervical back: Normal range of motion  and neck supple.     Right lower leg: No edema.     Left lower leg: No edema.  Lymphadenopathy:     Cervical: No cervical adenopathy.  Skin:    General: Skin is warm and dry.     Findings: No erythema or rash.  Neurological:     Mental Status: He is alert and oriented to person, place, and time. Mental status is at baseline.  Psychiatric:        Behavior: Behavior normal.     Comments: Well groomed, good eye contact, normal speech and thoughts      Results for orders placed or performed in visit on 11/17/21  HM DIABETES EYE EXAM  Result Value Ref Range   HM Diabetic Eye Exam Retinopathy (A) No Retinopathy      Assessment & Plan:   Problem List Items Addressed This Visit   None Visit Diagnoses     Shortness of breath    -  Primary   Relevant Medications   ANORO ELLIPTA 62.5-25 MCG/ACT AEPB   Other Relevant Orders   Ambulatory referral to Pulmonology   Moderate persistent asthma without complication       Relevant Medications    ANORO ELLIPTA 62.5-25 MCG/ACT AEPB   Other Relevant Orders   Ambulatory referral to Pulmonology       referral for chronic worsening dyspnea, even at rest and with exertion, he has been struggling with this for >1 year, has been evaluated by Nephrology with CKD, and now Cardiology - they are doing ECHO and pursuing work up to rule out CHF, but he does not have swelling. Has tried variety of inhalers in past mixed results, has steroid allergy, now switching to Anoro. He has episodes of significant dyspnea even at rest at times. Requesting further diagnostic work up and management   Strict precautions to return to ED if cannot catch breath with persistent dyspnea or new worsening symptoms.  If cannot afford Anoro should try to get 1 inhaler then if it works we can work on med assistance w/ PAP  Orders Placed This Encounter  Procedures   Ambulatory referral to Pulmonology    Referral Priority:   Routine    Referral Type:   Consultation    Referral Reason:   Specialty Services Required    Requested Specialty:   Pulmonary Disease    Number of Visits Requested:   1     Meds ordered this encounter  Medications   ANORO ELLIPTA 62.5-25 MCG/ACT AEPB    Sig: Inhale 1 puff into the lungs daily.    Dispense:  60 each    Refill:  2      Follow up plan: Return if symptoms worsen or fail to improve.  Nobie Putnam, [facility] 02/03/2022, 1:35 PM

## 2022-02-16 DIAGNOSIS — E1121 Type 2 diabetes mellitus with diabetic nephropathy: Secondary | ICD-10-CM | POA: Diagnosis not present

## 2022-02-16 DIAGNOSIS — Z794 Long term (current) use of insulin: Secondary | ICD-10-CM | POA: Diagnosis not present

## 2022-02-16 DIAGNOSIS — J4540 Moderate persistent asthma, uncomplicated: Secondary | ICD-10-CM

## 2022-02-19 ENCOUNTER — Ambulatory Visit (INDEPENDENT_AMBULATORY_CARE_PROVIDER_SITE_OTHER): Payer: Medicare HMO

## 2022-02-19 VITALS — Ht 66.0 in | Wt 187.0 lb

## 2022-02-19 DIAGNOSIS — Z0000 Encounter for general adult medical examination without abnormal findings: Secondary | ICD-10-CM | POA: Diagnosis not present

## 2022-02-19 NOTE — Progress Notes (Signed)
Virtual Visit via Telephone Note  I connected with  Stephen White on 02/19/22 at  3:20 PM EST by telephone and verified that I am speaking with the correct person using two identifiers.  Location: Patient: home Provider: [facility] Persons participating in the virtual visit: [place]   I discussed the limitations, risks, security and privacy concerns of performing an evaluation and management service by telephone and the availability of in person appointments. The patient expressed understanding and agreed to proceed.  Interactive audio and video telecommunications were attempted between this nurse and patient, however failed, due to patient having technical difficulties OR patient did not have access to video capability.  We continued and completed visit with audio only.  Some vital signs may be absent or patient reported.   Dionisio David, LPN  Subjective:   Stephen White is a 86 y.o. male who presents for Medicare Annual/Subsequent preventive examination.  Review of Systems           Objective:    Today's Vitals   02/19/22 1522 02/19/22 1524  Weight: 187 lb (84.8 kg)   Height: 5' 6"  (1.676 m)   PainSc:  0-No pain   Body mass index is 30.18 kg/m.  Advanced Directives 04/03/2021 03/09/2021 08/12/2020 12/18/2019 06/08/2018 05/09/2018 06/07/2017  Does Patient Have a Medical Advance Directive? Yes No Yes Yes Yes Yes Yes  Type of Paramedic of [place];Living will - [place];Living will [place];Living will [place];Living will [place];Living will Living will  Does patient want to make changes to medical advance directive? No - Patient declined - - - - Yes (Inpatient - patient defers changing a medical advance directive at this time) -  Copy of [employer] in Chart? No - copy requested - No - copy requested No - copy requested No - copy requested  No - copy requested -  Would patient like information on creating a medical advance directive? - No - Patient declined - - - - -    Current Medications (verified) Outpatient Encounter Medications as of 02/19/2022  Medication Sig   albuterol (VENTOLIN HFA) 108 (90 Base) MCG/ACT inhaler Inhale into the lungs.   Continuous Blood Gluc Sensor (FREESTYLE LIBRE 14 DAY SENSOR) MISC 1 kit.   famotidine (PEPCID) 10 MG tablet Take by mouth.   albuterol (VENTOLIN HFA) 108 (90 Base) MCG/ACT inhaler Inhale 2 puffs into the lungs every 4 (four) hours as needed for wheezing or shortness of breath.   ANORO ELLIPTA 62.5-25 MCG/ACT AEPB Inhale 1 puff into the lungs daily.   aspirin 81 MG EC tablet Take 81 mg by mouth daily   aspirin 81 MG tablet Take 81 mg by mouth daily.    Carboxymethylcellulose Sodium (EYE DROPS OP) Apply to eye.    CARBOXYMETHYLCELLULOSE SODIUM OP Apply to eye.   Continuous Blood Gluc Receiver (FREESTYLE LIBRE 2 READER) DEVI by Does not apply route.   Emollient (COCOA BUTTER) LOTN Apply topically.   Emollient (GOLD BOND ULTIMATE) LOTN Apply topically at bedtime. Diabetic lotion   famotidine (PEPCID) 10 MG tablet Take 1 tablet (10 mg total) by mouth daily.   fluticasone (FLONASE) 50 MCG/ACT nasal spray Place 2 sprays into both nostrils daily. Use for 4-6 weeks then stop and use seasonally or as needed.   hydrochlorothiazide (HYDRODIURIL) 25 MG tablet TAKE 1 TABLET(25 MG) BY MOUTH DAILY   insulin aspart (NOVOLOG) 100 UNIT/ML FlexPen Take NovoLog  insulin 10-15 minutes BEFORE you eat.  Take NovoLog 40 units before breakfast Take NovoLog 15 units before lunch Take NovoLog 40 units before supper.  Adjust for CBG <70 following direction from Endocrinologist   insulin detemir (LEVEMIR) 100 UNIT/ML FlexPen Inject into the skin. Inject 15 Units subcutaneously nightly   loratadine (CLARITIN) 10 MG tablet Take 10 mg by mouth every other day as needed for allergies.   losartan (COZAAR) 100 MG tablet  TAKE 1 TABLET(100 MG) BY MOUTH DAILY   montelukast (SINGULAIR) 10 MG tablet Take 1 tablet (10 mg total) by mouth at bedtime.   montelukast (SINGULAIR) 10 MG tablet Take by mouth.   MULTIPLE VITAMIN PO Take by mouth daily.    Multiple Vitamins-Minerals (PX COMPLETE SENIOR MULTIVITS) TABS Take by mouth.   omeprazole (PRILOSEC) 20 MG capsule Take by mouth.   ONE TOUCH ULTRA TEST test strip 1 each by Other route as needed.    pravastatin (PRAVACHOL) 10 MG tablet TAKE 1 TABLET BY MOUTH EVERY DAY   SALINE MIST SPRAY NA Place into the nose.   vitamin B-12 (CYANOCOBALAMIN) 500 MCG tablet Take 500 mcg by mouth daily.   No facility-administered encounter medications on file as of 02/19/2022.    Allergies (verified) Patient has no known allergies.   History: Past Medical History:  Diagnosis Date   Actinic keratosis    Allergy    Basal cell carcinoma 08/29/2018   L post inf ear/excision   Basal cell carcinoma 08/05/2020   L lat forehead above the brow - [facility]   Chronic kidney disease    GERD (gastroesophageal reflux disease)    Hyperlipidemia    Hypertension    Squamous cell carcinoma of skin 03/26/2015   L dorsal hand   Squamous cell carcinoma of skin 06/17/2015   R cheek   Squamous cell carcinoma of skin 05/10/2019   R hand dorsum/in situ   Squamous cell carcinoma of skin 08/05/2020   R of mideline forehead - [facility]   Thyroid disease    Type 2 diabetes mellitus ([place])    Vertigo    Wears hearing aid in both ears    Past Surgical History:  Procedure Laterality Date   CATARACT EXTRACTION W/PHACO Left 04/03/2021   Procedure: CATARACT EXTRACTION PHACO AND INTRAOCULAR LENS PLACEMENT (IOC) LEFT DIABETIC 17.22 01:53.3 15.2%;  Surgeon: Leandrew Koyanagi, MD;  Location: [facility];  Service: Ophthalmology;  Laterality: Left;  Diabetic - insulin   EYE SURGERY     Family History  Problem Relation Age of Onset   Diabetes Mother    Diabetes Father    Stroke Father    Diabetes  Brother    Diabetes Sister    Diabetes Sister    Diabetes Brother    Diabetes Brother    Diabetes Brother    Cancer Brother    Diabetes Brother    Social History   Socioeconomic History   Marital status: Married    Spouse name: Not on file   Number of children: 1   Years of education: Not on file   Highest education level: 12th grade  Occupational History   Occupation: retired  Tobacco Use   Smoking status: Former    Packs/day: 0.30    Years: 25.00    Pack years: 7.50    Types: Cigarettes    Quit date: 12/19/1978    Years since quitting: 43.2   Smokeless tobacco: Former  Scientific laboratory technician Use: Never used  Substance and Sexual  Activity   Alcohol use: Yes    Alcohol/week: 0.0 standard drinks   Drug use: No   Sexual activity: Not on file  Other Topics Concern   Not on file  Social History Narrative   Not on file   Social Determinants of Health   Financial Resource Strain: Not on file  Food Insecurity: Not on file  Transportation Needs: Not on file  Physical Activity: Not on file  Stress: Not on file  Social Connections: Not on file    Tobacco Counseling Counseling given: Not Answered   Clinical Intake:  Pre-visit preparation completed: Yes  Pain : No/denies pain Pain Score: 0-No pain     Nutritional Risks: None Diabetes: Yes CBG done?: No Did pt. bring in CBG monitor from home?: No  How often do you need to have someone help you when you read instructions, pamphlets, or other written materials from your doctor or pharmacy?: 1 - Never  Diabetic?yes Nutrition Risk Assessment:  Has the patient had any N/V/D within the last 2 months?  No  Does the patient have any non-healing wounds?  No  Has the patient had any unintentional weight loss or weight gain?  No   Diabetes:  Is the patient diabetic?  Yes  If diabetic, was a CBG obtained today?  No  Did the patient bring in their glucometer from home?  No  How often do you monitor your CBG's?  Several times a day.   Financial Strains and Diabetes Management:  Are you having any financial strains with the device, your supplies or your medication? No .  Does the patient want to be seen by Chronic Care Management for management of their diabetes?  No  Would the patient like to be referred to a Nutritionist or for Diabetic Management?  No   Diabetic Exams:  Diabetic Eye Exam: Completed 10/29/21. Pt has been advised about the importance in completing this exam.    Diabetic Foot Exam: Completed 08/15/20. Pt has been advised about the importance in completing this exam.   Interpreter Needed?: No  Information entered by :: Kirke Shaggy, LPN   Activities of Daily Living In your present state of health, do you have any difficulty performing the following activities: 04/03/2021  Hearing? N  Vision? N  Difficulty concentrating or making decisions? N  Walking or climbing stairs? N  Dressing or bathing? N  Some recent data might be hidden    Patient Care Team: Olin Hauser, DO as PCP - General (Family Medicine) Eulogio Bear, MD as Consulting Physician (Ophthalmology) Gabriel Carina Betsey Holiday, MD as Physician Assistant (Endocrinology) Ralene Bathe, MD as Consulting Physician (Dermatology) Curley Spice, Virl Diamond, RPH-CPP as Pharmacist  Indicate any recent Medical Services you may have received from other than [facility] providers in the past year (date may be approximate).     Assessment:   This is a routine wellness examination for [employer].  Hearing/Vision screen No results found.  Dietary issues and exercise activities discussed:     Goals Addressed   None   Depression Screen PHQ 2/9 Scores 11/17/2020 08/15/2020 08/12/2020 04/01/2020 02/19/2020 12/18/2019 04/26/2019  PHQ - 2 Score 0 0 0 0 0 0 0    Fall Risk Fall Risk  02/17/2021 11/17/2020 08/15/2020 08/12/2020 04/01/2020  Falls in the past year? 0 0 0 0 0  Number falls in past yr: 0 0 0 - 0  Injury with Fall? 0 0 0 - 0   Risk for fall due to : - - -  Medication side effect -  Follow up - Falls evaluation completed Falls evaluation completed Falls evaluation completed;Education provided;Falls prevention discussed -    FALL RISK PREVENTION PERTAINING TO THE HOME:  Any stairs in or around the home? No  If so, are there any without handrails? No  Home free of loose throw rugs in walkways, pet beds, electrical cords, etc? Yes  Adequate lighting in your home to reduce risk of falls? Yes   ASSISTIVE DEVICES UTILIZED TO PREVENT FALLS:  Life alert? No  Use of a cane, walker or w/c? Yes  Grab bars in the bathroom? Yes  Shower chair or bench in shower? Yes  Elevated toilet seat or a handicapped toilet? Yes    Cognitive Function:Normal cognitive status assessed by direct observation by this Nurse Health Advisor. No abnormalities found.       6CIT Screen 08/12/2020 06/08/2018 06/07/2017  What Year? 0 points 4 points 0 points  What month? 0 points 0 points 0 points  What time? 0 points 0 points 0 points  Count back from 20 2 points 0 points 0 points  Months in reverse 4 points 2 points 4 points  Repeat phrase 0 points 0 points 0 points  Total Score 6 6 4     Immunizations Immunization History  Administered Date(s) Administered   Fluad Quad(high Dose 65+) 11/02/2019, 10/16/2020, 12/29/2021   Influenza, High Dose Seasonal PF 10/03/2015, 11/03/2016, 10/06/2017, 11/06/2018   PFIZER(Purple Top)SARS-COV-2 Vaccination 01/14/2020, 02/04/2020   Pneumococcal Conjugate-13 12/24/2014   Pneumococcal Polysaccharide-23 12/25/2012   Td 07/02/2015   Zoster, Live 12/25/2012    TDAP status: Up to date  Flu Vaccine status: Up to date  Pneumococcal vaccine status: Up to date  Covid-19 vaccine status: Completed vaccines  Qualifies for Shingles Vaccine? Yes   Zostavax completed Yes   Shingrix Completed?: No.    Education has been provided regarding the importance of this vaccine. Patient has been advised to call  insurance company to determine out of pocket expense if they have not yet received this vaccine. Advised may also receive vaccine at local pharmacy or Health Dept. Verbalized acceptance and understanding.  Screening Tests Health Maintenance  Topic Date Due   Zoster Vaccines- Shingrix (1 of 2) Never done   COVID-19 Vaccine (3 - Pfizer risk series) 03/03/2020   HEMOGLOBIN A1C  06/30/2021   FOOT EXAM  08/15/2021   OPHTHALMOLOGY EXAM  10/29/2022   TETANUS/TDAP  07/01/2025   Pneumonia Vaccine 44+ Years old  Completed   INFLUENZA VACCINE  Completed   HPV VACCINES  Aged Out    Health Maintenance  Health Maintenance Due  Topic Date Due   Zoster Vaccines- Shingrix (1 of 2) Never done   COVID-19 Vaccine (3 - Pfizer risk series) 03/03/2020   HEMOGLOBIN A1C  06/30/2021   FOOT EXAM  08/15/2021    Colorectal cancer screening: No longer required.   Lung Cancer Screening: (Low Dose CT Chest recommended if Age 1-80 years, 30 pack-year currently smoking OR have quit w/in 15years.) does not qualify.   Additional Screening:  Hepatitis C Screening: does not qualify; Completed no  Vision Screening: Recommended annual ophthalmology exams for early detection of glaucoma and other disorders of the eye. Is the patient up to date with their annual eye exam?  Yes  Who is the provider or what is the name of the office in which the patient attends annual eye exams? [facility] If pt is not established with a provider, would they like to be  referred to a provider to establish care? No .   Dental Screening: Recommended annual dental exams for proper oral hygiene  Community Resource Referral / Chronic Care Management: CRR required this visit?  No   CCM required this visit?  No      Plan:     I have personally reviewed and noted the following in the patients chart:   Medical and social history Use of alcohol, tobacco or illicit drugs  Current medications and supplements including  opioid prescriptions. Patient is not currently taking opioid prescriptions. Functional ability and status Nutritional status Physical activity Advanced directives List of other physicians Hospitalizations, surgeries, and ER visits in previous 12 months Vitals Screenings to include cognitive, depression, and falls Referrals and appointments  In addition, I have reviewed and discussed with patient certain preventive protocols, quality metrics, and best practice recommendations. A written personalized care plan for preventive services as well as general preventive health recommendations were provided to patient.     Dionisio David, LPN   01/25/7504   Nurse Notes:

## 2022-02-19 NOTE — Patient Instructions (Signed)
[name] , Thank you for taking time to come for your Medicare Wellness Visit. I appreciate your ongoing commitment to your health goals. Please review the following plan we discussed and let me know if I can assist you in the future.   Screening recommendations/referrals: Colonoscopy: aged out Recommended yearly ophthalmology/optometry visit for glaucoma screening and checkup Recommended yearly dental visit for hygiene and checkup  Vaccinations: Influenza vaccine: 09/28/22 Pneumococcal vaccine: 12/24/14 Tdap vaccine: 07/02/15 Shingles vaccine: Zostavax 12/25/12   Covid-19: 01/14/20, 02/04/20  Advanced directives: no  Conditions/risks identified: none  Next appointment: Follow up in one year for your annual wellness visit. 02/25/23 @ 3:20pm by phone  Preventive Care 65 Years and Older, Male Preventive care refers to lifestyle choices and visits with your health care provider that can promote health and wellness. What does preventive care include? A yearly physical exam. This is also called an annual well check. Dental exams once or twice a year. Routine eye exams. Ask your health care provider how often you should have your eyes checked. Personal lifestyle choices, including: Daily care of your teeth and gums. Regular physical activity. Eating a healthy diet. Avoiding tobacco and drug use. Limiting alcohol use. Practicing safe sex. Taking low doses of aspirin every day. Taking vitamin and mineral supplements as recommended by your health care provider. What happens during an annual well check? The services and screenings done by your health care provider during your annual well check will depend on your age, overall health, lifestyle risk factors, and family history of disease. Counseling  Your health care provider may ask you questions about your: Alcohol use. Tobacco use. Drug use. Emotional well-being. Home and relationship well-being. Sexual activity. Eating  habits. History of falls. Memory and ability to understand (cognition). Work and work Statistician. Screening  You may have the following tests or measurements: Height, weight, and BMI. Blood pressure. Lipid and cholesterol levels. These may be checked every 5 years, or more frequently if you are over 84 years old. Skin check. Lung cancer screening. You may have this screening every year starting at age 84 if you have a 30-pack-year history of smoking and currently smoke or have quit within the past 15 years. Fecal occult blood test (FOBT) of the stool. You may have this test every year starting at age 84. Flexible sigmoidoscopy or colonoscopy. You may have a sigmoidoscopy every 5 years or a colonoscopy every 10 years starting at age 84. Prostate cancer screening. Recommendations will vary depending on your family history and other risks. Hepatitis C blood test. Hepatitis B blood test. Sexually transmitted disease (STD) testing. Diabetes screening. This is done by checking your blood sugar (glucose) after you have not eaten for a while (fasting). You may have this done every 1-3 years. Abdominal aortic aneurysm (AAA) screening. You may need this if you are a current or former smoker. Osteoporosis. You may be screened starting at age 84 if you are at high risk. Talk with your health care provider about your test results, treatment options, and if necessary, the need for more tests. Vaccines  Your health care provider may recommend certain vaccines, such as: Influenza vaccine. This is recommended every year. Tetanus, diphtheria, and acellular pertussis (Tdap, Td) vaccine. You may need a Td booster every 10 years. Zoster vaccine. You may need this after age 84. Pneumococcal 13-valent conjugate (PCV13) vaccine. One dose is recommended after age 84. Pneumococcal polysaccharide (PPSV23) vaccine. One dose is recommended after age 84. Talk to your health care  provider about which screenings and  vaccines you need and how often you need them. This information is not intended to replace advice given to you by your health care provider. Make sure you discuss any questions you have with your health care provider. Document Released: 01/02/2016 Document Revised: 08/25/2016 Document Reviewed: 10/07/2015 Elsevier Interactive Patient Education  2017 [employer] Prevention in the Home Falls can cause injuries. They can happen to people of all ages. There are many things you can do to make your home safe and to help prevent falls. What can I do on the outside of my home? Regularly fix the edges of walkways and driveways and fix any cracks. Remove anything that might make you trip as you walk through a door, such as a raised step or threshold. Trim any bushes or trees on the path to your home. Use bright outdoor lighting. Clear any walking paths of anything that might make someone trip, such as rocks or tools. Regularly check to see if handrails are loose or broken. Make sure that both sides of any steps have handrails. Any raised decks and porches should have guardrails on the edges. Have any leaves, snow, or ice cleared regularly. Use sand or salt on walking paths during winter. Clean up any spills in your garage right away. This includes oil or grease spills. What can I do in the bathroom? Use night lights. Install grab bars by the toilet and in the tub and shower. Do not use towel bars as grab bars. Use non-skid mats or decals in the tub or shower. If you need to sit down in the shower, use a plastic, non-slip stool. Keep the floor dry. Clean up any water that spills on the floor as soon as it happens. Remove soap buildup in the tub or shower regularly. Attach bath mats securely with double-sided non-slip rug tape. Do not have throw rugs and other things on the floor that can make you trip. What can I do in the bedroom? Use night lights. Make sure that you have a light by your  bed that is easy to reach. Do not use any sheets or blankets that are too big for your bed. They should not hang down onto the floor. Have a firm chair that has side arms. You can use this for support while you get dressed. Do not have throw rugs and other things on the floor that can make you trip. What can I do in the kitchen? Clean up any spills right away. Avoid walking on wet floors. Keep items that you use a lot in easy-to-reach places. If you need to reach something above you, use a strong step stool that has a grab bar. Keep electrical cords out of the way. Do not use floor polish or wax that makes floors slippery. If you must use wax, use non-skid floor wax. Do not have throw rugs and other things on the floor that can make you trip. What can I do with my stairs? Do not leave any items on the stairs. Make sure that there are handrails on both sides of the stairs and use them. Fix handrails that are broken or loose. Make sure that handrails are as long as the stairways. Check any carpeting to make sure that it is firmly attached to the stairs. Fix any carpet that is loose or worn. Avoid having throw rugs at the top or bottom of the stairs. If you do have throw rugs, attach them to the  floor with carpet tape. Make sure that you have a light switch at the top of the stairs and the bottom of the stairs. If you do not have them, ask someone to add them for you. What else can I do to help prevent falls? Wear shoes that: Do not have high heels. Have rubber bottoms. Are comfortable and fit you well. Are closed at the toe. Do not wear sandals. If you use a stepladder: Make sure that it is fully opened. Do not climb a closed stepladder. Make sure that both sides of the stepladder are locked into place. Ask someone to hold it for you, if possible. Clearly mark and make sure that you can see: Any grab bars or handrails. First and last steps. Where the edge of each step is. Use tools that  help you move around (mobility aids) if they are needed. These include: Canes. Walkers. Scooters. Crutches. Turn on the lights when you go into a dark area. Replace any light bulbs as soon as they burn out. Set up your furniture so you have a clear path. Avoid moving your furniture around. If any of your floors are uneven, fix them. If there are any pets around you, be aware of where they are. Review your medicines with your doctor. Some medicines can make you feel dizzy. This can increase your chance of falling. Ask your doctor what other things that you can do to help prevent falls. This information is not intended to replace advice given to you by your health care provider. Make sure you discuss any questions you have with your health care provider. Document Released: 10/02/2009 Document Revised: 05/13/2016 Document Reviewed: 01/10/2015 Elsevier Interactive Patient Education  2017 [employer].

## 2022-02-26 ENCOUNTER — Other Ambulatory Visit: Payer: Self-pay

## 2022-02-26 ENCOUNTER — Ambulatory Visit (INDEPENDENT_AMBULATORY_CARE_PROVIDER_SITE_OTHER): Payer: Medicare HMO | Admitting: Family Medicine

## 2022-02-26 ENCOUNTER — Encounter: Payer: Self-pay | Admitting: Family Medicine

## 2022-02-26 VITALS — BP 138/68 | HR 69 | Ht 66.0 in | Wt 194.2 lb

## 2022-02-26 DIAGNOSIS — R0602 Shortness of breath: Secondary | ICD-10-CM | POA: Diagnosis not present

## 2022-02-26 NOTE — Progress Notes (Signed)
Subjective:    Patient ID: Stephen White, male    DOB: Mar 05, 1936, 86 y.o.   MRN: [id number]  Stephen White is a 86 y.o. male presenting on 02/26/2022 for Shortness of Breath and Follow-up   HPI  Dyspnea  Upcoming apt with [facility] Pulmonology Dr Patsey Berthold 03/26/22  Interval update from [facility] Cardiology Dr Saralyn Pilar, his heart checked out well without any concerns, had ECHO and reassurance on heart valves and function.  He believes that the inhalers may have triggered some of his "shaking tremor" symptoms. He believes the new inhaler Anoro worsened it. He still has Albuterol.  He is using a cane for mobility and not using every day. Some days are better than others.  He still describes persistent dyspnea at times. But overall has improved some.   Depression screen [facility] 2/9 02/26/2022 02/19/2022 11/17/2020  Decreased Interest 3 0 0  Down, Depressed, Hopeless 0 0 0  PHQ - 2 Score 3 0 0  Altered sleeping 2 - -  Tired, decreased energy 2 - -  Change in appetite 0 - -  Feeling bad or failure about yourself  0 - -  Trouble concentrating 0 - -  Moving slowly or fidgety/restless 0 - -  Suicidal thoughts 0 - -  PHQ-9 Score 7 - -  Difficult doing work/chores Not difficult at all - -    Social History   Tobacco Use   Smoking status: Former    Packs/day: 0.30    Years: 25.00    Pack years: 7.50    Types: Cigarettes    Quit date: 12/19/1978    Years since quitting: 43.2   Smokeless tobacco: Former  Scientific laboratory technician Use: Never used  Substance Use Topics   Alcohol use: Yes    Alcohol/week: 0.0 standard drinks   Drug use: No    Review of Systems Per HPI unless specifically indicated above     Objective:    BP 138/68 (BP Location: Left Arm, Cuff Size: Normal)    Pulse 69    Ht '5\' 6"'$  (1.676 m)    Wt 194 lb 3.2 oz (88.1 kg)    SpO2 100%    BMI 31.34 kg/m   Wt Readings from Last 3 Encounters:  02/26/22 194 lb 3.2 oz (88.1 kg)  02/19/22 187 lb (84.8 kg)  02/03/22 190 lb 12.8  oz (86.5 kg)    Physical Exam Vitals and nursing note reviewed.  Constitutional:      General: He is not in acute distress.    Appearance: He is well-developed. He is not diaphoretic.     Comments: Well-appearing, comfortable, cooperative  HENT:     Head: Normocephalic and atraumatic.  Eyes:     General:        Right eye: No discharge.        Left eye: No discharge.     Conjunctiva/sclera: Conjunctivae normal.  Neck:     Thyroid: No thyromegaly.  Cardiovascular:     Rate and Rhythm: Normal rate and regular rhythm.     Pulses: Normal pulses.     Heart sounds: Normal heart sounds. No murmur heard. Pulmonary:     Effort: Pulmonary effort is normal. No respiratory distress.     Breath sounds: Normal breath sounds. No wheezing or rales.  Musculoskeletal:        General: Normal range of motion.     Cervical back: Normal range of motion and neck supple.  Lymphadenopathy:  Cervical: No cervical adenopathy.  Skin:    General: Skin is warm and dry.     Findings: No erythema or rash.  Neurological:     Mental Status: He is alert and oriented to person, place, and time. Mental status is at baseline.  Psychiatric:        Behavior: Behavior normal.     Comments: Well groomed, good eye contact, normal speech and thoughts   Results for orders placed or performed in visit on 11/17/21  HM DIABETES EYE EXAM  Result Value Ref Range   HM Diabetic Eye Exam Retinopathy (A) No Retinopathy    CARDIOLOGY DEPARTMENT                [place], [place]                                    [id number]            [place] #: [id number]            [address] [place] [employer], [place] [address]       Date: 02/17/2022 01: 30 PM                                                               Adult   Male   Age: 44 yrs            ECHOCARDIOGRAM REPORT                              Outpatient                                                                [facility]       STUDY:CHEST WALL               TAPE:0000: 00: 0: 00: 00 MD1: Paraschos, MD Alex        ECHO:Yes    DOPPLER:Yes       FILE:0000-000-000        BP: 120/84 mmHg       COLOR:Yes   CONTRAST:No     MACHINE:Philips   RV BIOPSY:No          3D:No  SOUND QLTY:Moderate            Height: 66 in      MEDIUM:None                                              Weight: 191 lb  BSA: 2.0 m2  _________________________________________________________________________________________                HISTORY: DOE                 REASON: Assess, LV function             INDICATION: R06.02 Shortness of breath  _________________________________________________________________________________________  ECHOCARDIOGRAPHIC MEASUREMENTS  2D DIMENSIONS  AORTA                  Values   Normal Range   MAIN PA         Values    Normal Range                Annulus: nm*          [2.3-2.9]         PA Main: nm*       [1.5-2.1]              Aorta Sin: 3.6 cm       [3.1-3.7]    RIGHT VENTRICLE            ST Junction: nm*          [2.6-3.2]         RV Base: 4.0 cm    [<4.2]              Asc.Aorta: nm*          [2.6-3.4]          RV Mid: 2.2 cm    [<3.5]  LEFT VENTRICLE                                      RV Length: nm*       [<8.6]                  LVIDd: 5.0 cm       [4.2-5.9]    INFERIOR VENA CAVA                  LVIDs: 3.7 cm                        Max. IVC: nm*       [<=2.1]                     FS: 26.1 %       [>25]            Min. IVC: nm*                    SWT: 0.98 cm      [0.6-1.0]    ------------------                    PWT: 0.87 cm      [0.6-1.0]    nm* - not measured  LEFT ATRIUM                LA Diam: 3.7 cm       [3.0-4.0]            LA A4C Area: nm*          [<20]              LA Volume: nm*          [18-58]  _________________________________________________________________________________________  ECHOCARDIOGRAPHIC DESCRIPTIONS   AORTIC  ROOT                   Size: Normal             Dissection: INDETERM FOR DISSECTION  AORTIC VALVE               Leaflets: Tricuspid                   Morphology: Normal               Mobility: Fully mobile  LEFT VENTRICLE                   Size: Normal                        Anterior: Normal            Contraction: Normal                         Lateral: Normal             Closest EF: >55% (Estimated)                Septal: Normal              LV Masses: No Masses                       Apical: Normal                    LVH: None                          Inferior: Normal                                                      Posterior: Normal           Dias.FxClass: (Grade 1) relaxation abnormal, E/A reversal  MITRAL VALVE               Leaflets: Normal                        Mobility: Fully mobile             Morphology: Normal  LEFT ATRIUM                   Size: Normal                       LA Masses: No masses              IA Septum: Normal IAS  MAIN PA                   Size: Normal  PULMONIC VALVE             Morphology: Normal                        Mobility: Fully mobile  RIGHT VENTRICLE              RV Masses: No Masses  Size: Normal              Free Wall: Normal                     Contraction: Normal  TRICUSPID VALVE               Leaflets: Normal                        Mobility: Fully mobile             Morphology: Normal  RIGHT ATRIUM                   Size: Normal                        RA Other: None                RA Mass: No masses  PERICARDIUM                  Fluid: No effusion  INFERIOR VENACAVA                   Size: Normal Normal respiratory collapse  _________________________________________________________________________________________   DOPPLER ECHO and OTHER SPECIAL PROCEDURES                 Aortic: TRIVIAL AR                 No AS                         100.2 cm/sec peak vel      4.0 mmHg peak grad                          2.2 mmHg mean grad         2.7 cm^2 by DOPPLER                 Mitral: MILD MR                    No MS                         MV Inflow E Vel = 83.1 cm/sec     MV Annulus E'Vel = 6.4 cm/sec                         E/E'Ratio = 13.0              Tricuspid: MILD TR                    No TS                         247.0 cm/sec peak TR vel   27.4 mmHg peak RV pressure              Pulmonary: TRIVIAL PR                 No PS  _________________________________________________________________________________________  INTERPRETATION  NORMAL LEFT VENTRICULAR SYSTOLIC FUNCTION  NORMAL RIGHT VENTRICULAR SYSTOLIC FUNCTION  MILD VALVULAR REGURGITATION (See above)  NO VALVULAR STENOSIS  _________________________________________________________________________________________  Electronically signed by      MD Miquel Dunn on 02/17/2022 04: 28 PM  Performed By: Cephus Shelling, RVT     Ordering Physician: Saralyn Pilar, MD Cristie Hem  _________________________________________________________________________________________ Procedure Note  Isaias Cowman, MD - 02/17/2022  Formatting of this note might be different from the original.                          [place], [place]                                    [id number]            [place] #: [id number]            [place], [place], [place] [address]       Date: 02/17/2022 01: 5 PM                                                               Adult   Male   Age: 27 yrs            ECHOCARDIOGRAM REPORT                              Outpatient                                                               [facility]       STUDY:CHEST WALL               TAPE:0000: 00: 0: 00: 00 MD1: Paraschos, MD Alex        ECHO:Yes    DOPPLER:Yes       FILE:0000-000-000        BP: 120/84 mmHg       COLOR:Yes   CONTRAST:No      MACHINE:Philips   RV BIOPSY:No          3D:No  SOUND QLTY:Moderate            Height: 66 in      MEDIUM:None                                              Weight: 191 lb  BSA: 2.0 m2  _________________________________________________________________________________________                HISTORY: DOE                 REASON: Assess, LV function             INDICATION: R06.02 Shortness of breath  _________________________________________________________________________________________  ECHOCARDIOGRAPHIC MEASUREMENTS  2D DIMENSIONS  AORTA                  Values   Normal Range   MAIN PA         Values    Normal Range                Annulus: nm*          [2.3-2.9]         PA Main: nm*       [1.5-2.1]              Aorta Sin: 3.6 cm       [3.1-3.7]    RIGHT VENTRICLE            ST Junction: nm*          [2.6-3.2]         RV Base: 4.0 cm    [<4.2]              Asc.Aorta: nm*          [2.6-3.4]          RV Mid: 2.2 cm    [<3.5]  LEFT VENTRICLE                                      RV Length: nm*       [<8.6]                  LVIDd: 5.0 cm       [4.2-5.9]    INFERIOR VENA CAVA                  LVIDs: 3.7 cm                        Max. IVC: nm*       [<=2.1]                     FS: 26.1 %       [>25]            Min. IVC: nm*                    SWT: 0.98 cm      [0.6-1.0]    ------------------                    PWT: 0.87 cm      [0.6-1.0]    nm* - not measured  LEFT ATRIUM                LA Diam: 3.7 cm       [3.0-4.0]            LA A4C Area: nm*          [<20]              LA Volume: nm*          [18-58]  _________________________________________________________________________________________  ECHOCARDIOGRAPHIC DESCRIPTIONS  AORTIC ROOT  Size: Normal             Dissection: INDETERM FOR DISSECTION  AORTIC VALVE               Leaflets: Tricuspid                   Morphology: Normal               Mobility: Fully mobile   LEFT VENTRICLE                   Size: Normal                        Anterior: Normal            Contraction: Normal                         Lateral: Normal             Closest EF: >55% (Estimated)                Septal: Normal              LV Masses: No Masses                       Apical: Normal                    LVH: None                          Inferior: Normal                                                      Posterior: Normal           Dias.FxClass: (Grade 1) relaxation abnormal, E/A reversal  MITRAL VALVE               Leaflets: Normal                        Mobility: Fully mobile             Morphology: Normal  LEFT ATRIUM                   Size: Normal                       LA Masses: No masses              IA Septum: Normal IAS  MAIN PA                   Size: Normal  PULMONIC VALVE             Morphology: Normal                        Mobility: Fully mobile  RIGHT VENTRICLE              RV Masses: No Masses                         Size: Normal  Free Wall: Normal                     Contraction: Normal  TRICUSPID VALVE               Leaflets: Normal                        Mobility: Fully mobile             Morphology: Normal  RIGHT ATRIUM                   Size: Normal                        RA Other: None                RA Mass: No masses  PERICARDIUM                  Fluid: No effusion  INFERIOR VENACAVA                   Size: Normal Normal respiratory collapse  _________________________________________________________________________________________   DOPPLER ECHO and OTHER SPECIAL PROCEDURES                 Aortic: TRIVIAL AR                 No AS                         100.2 cm/sec peak vel      4.0 mmHg peak grad                         2.2 mmHg mean grad         2.7 cm^2 by DOPPLER                 Mitral: MILD MR                    No MS                         MV Inflow E Vel = 83.1 cm/sec     MV Annulus E'Vel = 6.4 cm/sec                          E/E'Ratio = 13.0              Tricuspid: MILD TR                    No TS                         247.0 cm/sec peak TR vel   27.4 mmHg peak RV pressure              Pulmonary: TRIVIAL PR                 No PS  _________________________________________________________________________________________  INTERPRETATION  NORMAL LEFT VENTRICULAR SYSTOLIC FUNCTION  NORMAL RIGHT VENTRICULAR SYSTOLIC FUNCTION  MILD VALVULAR REGURGITATION (See above)  NO VALVULAR STENOSIS  _________________________________________________________________________________________  Electronically signed by      MD Miquel Dunn on 02/17/2022 04: 28 PM           Performed By: Johnathan Hausen, RDCS,  RVT     Ordering Physician: Saralyn Pilar, MD Alex      Assessment & Plan:   Problem List Items Addressed This Visit   None Visit Diagnoses     Shortness of breath    -  Primary       Chronic dyspnea Improved symptoms mildly and also has no further muscle twitching/tremors, since stopping inhalers. Question if this was from albuterol or from inhaled steroid.  Reviewed Cardiology report from [facility] recently, reassurance with normal ECHO.  Will pursue Pulmonology consult next 03/26/22 with [place] pulm Dr Patsey Berthold will forward chart to her for review. Anticipate further diagnostics w/ PFTs among other evaluation and management.  No orders of the defined types were placed in this encounter.     Follow up plan: Return in about 4 months (around 06/28/2022) for 4 months (maybe after 7/18 if possible) Annual Physical consider fasting lab AFTER.  Nobie Putnam, [facility] 02/26/2022, 2:10 PM

## 2022-02-26 NOTE — Patient Instructions (Addendum)
Thank you for coming to the office today. ? ?I am glad you had an evaluation with Dr Saralyn Pilar at [facility]. It sounds like your heart is working well. ? ?Please bring your previous inhalers to Dr Patsey Berthold to review, which ones work and which ones don't work. ? ?[facility] ?[address] ?[place], [place] ?Phone: [phone] ? ? ?Recommend low calorie low sugar - sports type drink like Gatorade G2 for electrolytes to help muscles avoid fatigue and tremor. ? ?Also can consider a Pediatlyte type drink. Or the Mio or Electrolyte additive to water. ? ?Stop taking Fluticasone nasal spray, to avoid steroid. ? ? ?Please schedule a Follow-up Appointment to: Return in about 4 months (around 06/28/2022) for 4 months (maybe after 7/18 if possible) Annual Physical consider fasting lab AFTER. ? ?If you have any other questions or concerns, please feel free to call the office or send a message through [place]. You may also schedule an earlier appointment if necessary. ? ?Additionally, you may be receiving a survey about your experience at our office within a few days to 1 week by e-mail or mail. We value your feedback. ? ?Nobie Putnam, DO ?[place] ?

## 2022-03-01 ENCOUNTER — Ambulatory Visit (INDEPENDENT_AMBULATORY_CARE_PROVIDER_SITE_OTHER): Payer: Medicare HMO | Admitting: Pharmacist

## 2022-03-01 DIAGNOSIS — E1121 Type 2 diabetes mellitus with diabetic nephropathy: Secondary | ICD-10-CM

## 2022-03-01 DIAGNOSIS — E1169 Type 2 diabetes mellitus with other specified complication: Secondary | ICD-10-CM

## 2022-03-01 DIAGNOSIS — I129 Hypertensive chronic kidney disease with stage 1 through stage 4 chronic kidney disease, or unspecified chronic kidney disease: Secondary | ICD-10-CM

## 2022-03-01 DIAGNOSIS — Z794 Long term (current) use of insulin: Secondary | ICD-10-CM

## 2022-03-01 NOTE — Chronic Care Management (AMB) (Signed)
Chronic Care Management CCM Pharmacy Note  03/01/2022 Name:  Stephen White MRN:  [id number] DOB:  [birth year]   Subjective: Stephen White is an 86 y.o. year old male who is a primary patient of Stephen Hauser, DO.  The CCM team was consulted for assistance with disease management and care coordination needs.    Engaged with patient by telephone for follow up visit for pharmacy case management and/or care coordination services.   Objective:  Medications Reviewed Today     Reviewed by Stephen White, RPH-CPP (Pharmacist) on 03/01/22 at 1038  Med List Status: <None>   Medication Order Taking? Sig Documenting Provider Last Dose Status Informant  aspirin 81 MG EC tablet [id number]  Take 81 mg by mouth daily [provider]  Active   Carboxymethylcellulose Sodium (EYE DROPS OP) [id number]  Apply to eye.  [provider]  Active   Continuous Blood Gluc Receiver (FREESTYLE LIBRE 2 READER) DEVI [id number]  by Does not apply route. [provider]  Active   Continuous Blood Gluc Sensor (FREESTYLE LIBRE [place]) [place] [id number]  1 kit. [provider]  Active   Emollient Stephen White) Stephen White [id number]  Apply topically. [provider]  Active   Emollient (GOLD Thedore MinsSharlyne White [id number]  Apply topically at bedtime. Diabetic lotion [provider]  Active   famotidine (PEPCID) 10 MG tablet [id number] Yes Take 1 tablet (10 mg total) by mouth daily. Stephen Hauser, DO Taking Active   hydrochlorothiazide (HYDRODIURIL) 25 MG tablet [id number] Yes TAKE 1 TABLET(25 MG) BY MOUTH DAILY Stephen White, Stephen Doughty, DO Taking Active   insulin aspart (NOVOLOG) 100 UNIT/ML FlexPen [id number] Yes Take NovoLog insulin 10-15 minutes BEFORE you eat.  Take NovoLog 40 units before breakfast Take NovoLog 15 units before lunch Take NovoLog 40 units before supper.  Adjust for CBG <70 following direction from Endocrinologist [provider] Taking Active   insulin detemir (LEVEMIR) 100 UNIT/ML FlexPen [id number] Yes Inject into the skin. Inject 15 Units subcutaneously nightly [provider] Taking Active   loratadine (CLARITIN) 10 MG tablet [id number] No Take 10 mg by mouth every other day as needed for allergies.  Patient not taking: Reported on 03/01/2022   [provider] Not Taking Active   losartan (COZAAR) 100 MG tablet [id number] Yes TAKE 1 TABLET(100 MG) BY MOUTH DAILY Stephen White, Stephen Doughty, DO Taking Active   montelukast (SINGULAIR) 10 MG tablet [id number] No Take 1 tablet (10 mg total) by mouth at bedtime.  Patient not taking: Reported on 03/01/2022   Stephen Hauser, DO Not Taking Active   MULTIPLE VITAMIN PO [id number]  Take by mouth daily.  [provider]  Active            Med Note Stephen White 12:22 PM) Received from: [place] TABS [id number]  Take by mouth. [provider]  Active   ONE TOUCH ULTRA TEST test strip [id number]  1 each by Other route as needed.  [provider]  Active            Med Note Stephen White   Fri Oct 14, White  9:40 AM) Received from: External Pharmacy  pravastatin (PRAVACHOL) 10 MG tablet [id number] Yes TAKE 1 TABLET BY MOUTH EVERY DAY Stephen Hauser, DO Taking Active   Stephen White Yes Place into the nose. [provider]  Taking Active   vitamin B-12 (CYANOCOBALAMIN) 500 MCG tablet [id number]  Take 500 mcg by mouth daily. [provider]  Active             Pertinent Labs:   Lab Results  Component Value Date   CHOL 152 08/01/2020   HDL 56 08/01/2020   LDLCALC 82 08/01/2020   TRIG 68 08/01/2020   CHOLHDL 2.7 08/01/2020   Lab Results  Component Value Date   CREATININE 2.31 (H) 03/09/2021   BUN 49 (H) 03/09/2021   NA 137 03/09/2021   K 4.1  03/09/2021   CL 103 03/09/2021   CO2 26 03/09/2021    SDOH:  (Social Determinants of Health) assessments and interventions performed:    Stephen White, allergies, medications, health status, including review of consultants reports, laboratory and other test data, was performed as part of comprehensive evaluation and provision of chronic care management services.   Care Plan : PharmD - Medication Managment and Assistance  Updates made by Stephen White, RPH-CPP since 03/01/2022 12:00 AM     Problem: Disease Progression      Long-Range Goal: Disease Progression Prevented or Minimized   Start Date: 02/02/2021  Expected End Date: 05/03/2021  Recent Progress: On track  Priority: High  Note:   Current Barriers:  Financial Barriers in complicated patient with multiple medical conditions including T2DM, CKD, GERD, HTN, HLD; patient has [employer] and reports copay for Stephen White is cost prohibitive at this time Approved to receive Novolog and Levemir from [employer] patient assistance program through 12/19/2022 Fear of hypoglycemia  Pharmacist Clinical Goal(s):  Over the next 90 days, patient will achieve adherence to monitoring guidelines and medication adherence to achieve therapeutic efficacy through collaboration with PharmD and provider.   Interventions: 1:1 collaboration with Stephen Hauser, DO regarding development and update of comprehensive plan of care as evidenced by provider attestation and co-signature Inter-disciplinary care team collaboration (see longitudinal plan of care) Perform chart review.  Patient seen for Office Visit with PCP for follow up of shortness of breath on 3/10 Patient reported off of albuterol and Anoro inhalers as attributed inhaler use to twitching/tremors Patient has been referred to Pulmonology with appointment scheduled for 4/7 Provider advised patient:  Recommend low  calorie low sugar - sports type drink like Gatorade G2 for electrolytes to help muscles avoid fatigue and tremor. Stop taking Fluticasone nasal spray, to avoid steroid Office Visit with [facility] on 3/8 Today patient reports no longer having twitching since stopped inhalers Confirms planning to attend upcoming appointment with Pulmonology  Type 2 Diabetes Patient followed by [facility] Endocrinology for management Uncontrolled; current treatment: Levemir: 15 units nightly NovoLog: 40 units before breakfast 15 units before lunch  40 units before supper Patient verbalizes understanding that if blood glucose <70 before meal, take 1/2 scheduled dose for Novolog, as directed by [employer] patient on importance of taking insulin regimen as directed by Endocrinologist and calling Endocrinology as needed for concerns related to his blood sugar Have counseled on s/s of low blood sugar, prevention of lows and how to manage lows Patient using Freestyle Libre 2 monitor for low blood sugar alarm To administer Novolog doses within 10 minutes prior to meals Patient using FreeStyle Libre 2 continuous glucose monitor Orders sensor refills from [employer] Target goal range: 80-180 mg/dL Alarms set to go off for readings outside of range: 70-240 mg/dL Reports continuous glucose  monitor data from last 14-days: Average glucose: 187 Time in Target: Above: 49% In Target: 50% Below: 1% 98% of sensor data was captured over the last 2 weeks Again counsel on importance of administering Novolog doses within 10 minutes prior to meals  Dyspnea / suspected Asthma (per PCP): Current treatment: none Note patient has upcoming appointment with Pulmonology on 4/7  Hypertension Current regimen: HCTZ 25 mg daily Losartan 100 mg daily Denies monitoring home blood pressure recently  Have counseled patient to monitor home BP, keep log and have record to review during medical  appointments, to call office if readings consistently >140/90 or new symptoms     Hyperlipidemia Current regimen: pravastatin 10 mg daily Have counseled on importance of reviewing nutrition labels for cholesterol content.   Patient Goals/Self-Care Activities Over the next 90 days, patient will:  - take medications as prescribed using weekly pillbox - monitor blood glucose as directed by Endocrinologist  Note patient using FreeStyle Libre 2 continuous glucose monitor - check blood pressure, document, and provide at future appointments - attend medical appointments as scheduled Next appointment with Endocrinology on 4/25  Follow Up Plan: Telephone follow up appointment with care management team member scheduled for: 04/16/2022 at 10:00 AM       Wallace Cullens, PharmD, [place], [place] [phone]

## 2022-03-01 NOTE — Patient Instructions (Signed)
Visit Information ? ?Thank you for taking time to visit with me today. Please don't hesitate to contact me if I can be of assistance to you before our next scheduled telephone appointment. ? ?Following are the goals we discussed today:  ? Goals Addressed   ? ?  ?  ?  ?  ? This Visit's Progress  ?  Pharmacy Goals     ?  Our goal A1c is less than 7%. This corresponds with fasting sugars less than 130 and 2 hour after meal sugars less than 180. Please continue to monitor home blood sugar. ? ?Our goal bad cholesterol, or LDL, is less than 70. This is why it is important to continue taking your pravastatin. ? ?Please check your home blood pressure, keep a log of the results and have this record for [place] to review during our next telephone appointment ? ?Feel free to call me with any questions or concerns. I look forward to our next call! ? ?Wallace Cullens, PharmD, BCACP, CPP ?Clinical Pharmacist ?[facility] ?[place] ?[phone] ?  ? ?  ? ? ? ?Our next appointment is by telephone on 04/16/2022 at 10:00 AM ? ?Please call the care guide team at [phone] if you need to cancel or reschedule your appointment.  ? ? ?Patient verbalizes understanding of instructions and care plan provided today and agrees to view in [place]. Active MyChart status confirmed with patient.   ? ?

## 2022-03-19 DIAGNOSIS — E1169 Type 2 diabetes mellitus with other specified complication: Secondary | ICD-10-CM

## 2022-03-19 DIAGNOSIS — E785 Hyperlipidemia, unspecified: Secondary | ICD-10-CM

## 2022-03-19 DIAGNOSIS — E1121 Type 2 diabetes mellitus with diabetic nephropathy: Secondary | ICD-10-CM | POA: Diagnosis not present

## 2022-03-19 DIAGNOSIS — Z794 Long term (current) use of insulin: Secondary | ICD-10-CM

## 2022-03-19 DIAGNOSIS — N184 Chronic kidney disease, stage 4 (severe): Secondary | ICD-10-CM

## 2022-03-19 DIAGNOSIS — I129 Hypertensive chronic kidney disease with stage 1 through stage 4 chronic kidney disease, or unspecified chronic kidney disease: Secondary | ICD-10-CM

## 2022-03-26 ENCOUNTER — Encounter: Payer: Self-pay | Admitting: Pulmonary Disease

## 2022-03-26 ENCOUNTER — Ambulatory Visit: Payer: Medicare HMO | Admitting: Pulmonary Disease

## 2022-03-26 ENCOUNTER — Other Ambulatory Visit: Payer: Self-pay | Admitting: Family Medicine

## 2022-03-26 VITALS — BP 124/60 | HR 71 | Temp 97.6°F | Ht 66.0 in | Wt 193.4 lb

## 2022-03-26 DIAGNOSIS — I129 Hypertensive chronic kidney disease with stage 1 through stage 4 chronic kidney disease, or unspecified chronic kidney disease: Secondary | ICD-10-CM

## 2022-03-26 DIAGNOSIS — Z794 Long term (current) use of insulin: Secondary | ICD-10-CM

## 2022-03-26 DIAGNOSIS — N184 Chronic kidney disease, stage 4 (severe): Secondary | ICD-10-CM | POA: Diagnosis not present

## 2022-03-26 DIAGNOSIS — R0602 Shortness of breath: Secondary | ICD-10-CM

## 2022-03-26 DIAGNOSIS — E1122 Type 2 diabetes mellitus with diabetic chronic kidney disease: Secondary | ICD-10-CM

## 2022-03-26 NOTE — Telephone Encounter (Signed)
Requested medication (s) are due for refill today: yes ? ?Requested medication (s) are on the active medication list: yes ? ?Last refill:  07/06/21 ? ?Future visit scheduled: yes ? ?Notes to clinic:  Unable to refill per protocol due to failed labs. ? ? ?  ?Requested Prescriptions  ?Pending Prescriptions Disp Refills  ? hydrochlorothiazide (HYDRODIURIL) 25 MG tablet [Pharmacy Med Name: HYDROCHLOROTHIAZIDE '25MG'$  TABLETS] 90 tablet 1  ?  Sig: TAKE 1 TABLET(25 MG) BY MOUTH DAILY  ?  ? Cardiovascular: Diuretics - Thiazide Failed - 03/26/2022  7:04 AM  ?  ?  Failed - Cr in normal range and within 180 days  ?  Creat  ?Date Value Ref Range Status  ?02/24/2021 2.69 (H) 0.70 - 1.11 mg/dL Final  ?  Comment:  ?  For patients >54 years of age, the reference limit ?for Creatinine is approximately 13% higher for people ?identified as African-American. ?. ?  ? ?Creatinine, Ser  ?Date Value Ref Range Status  ?03/09/2021 2.31 (H) 0.61 - 1.24 mg/dL Final  ?  ?  ?  ?  Failed - K in normal range and within 180 days  ?  Potassium  ?Date Value Ref Range Status  ?03/09/2021 4.1 3.5 - 5.1 mmol/L Final  ?  ?  ?  ?  Failed - Na in normal range and within 180 days  ?  Sodium  ?Date Value Ref Range Status  ?03/09/2021 137 135 - 145 mmol/L Final  ?11/07/2018 141 134 - 144 mmol/L Final  ?  ?  ?  ?  Passed - Last BP in normal range  ?  BP Readings from Last 1 Encounters:  ?03/26/22 124/60  ?  ?  ?  ?  Passed - Valid encounter within last 6 months  ?  Recent Outpatient Visits   ? ?      ? 4 weeks ago Shortness of breath  ? [place], DO  ? 1 month ago Shortness of breath  ? [place], DO  ? 2 months ago Dyspnea on exertion  ? [place], DO  ? 4 months ago Acute non-recurrent frontal sinusitis  ? [place], DO  ? 11 months ago Dyspnea on exertion  ? [place], DO  ? ?  ?  ?Future Appointments   ? ?        ? In 1 month Ralene Bathe, MD [place]  ? In 3 months Parks Ranger, Devonne Doughty, DO [facility], [place]  ? ?  ? ?  ?  ?  ? ? ?

## 2022-03-26 NOTE — Patient Instructions (Addendum)
We are going to get some breathing tests and a blood test that will tell us about your oxygen level in the lungs. ? ?You may continue using your rescue inhaler. ? ?You have an upcoming appointment with your kidney doctor, I believe they check your blood work when you follow-up with them I suspect your kidneys are getting worse and that may be part of your shortness of breath because of fluid buildup. ? ?We are going to get a scan of your lungs to look for small blood clots that may be causing your symptoms. ? ?We will see you in follow-up in 4 to 6 weeks time call sooner should any new problems arise. ? ? ?

## 2022-03-26 NOTE — Progress Notes (Signed)
Subjective:    Patient ID: Stephen White, male    DOB: 04-Sep-1936, 86 y.o.   MRN: [id number] Patient Care Team: Smitty Cords, DO as PCP - General (Family Medicine) [place] Crane, MD as Consulting Physician (Ophthalmology) Tedd Sias Marlana Salvage, MD as Physician Assistant (Endocrinology) Deirdre Evener, MD as Consulting Physician (Dermatology) Ronney Asters Jackelyn Poling, RPH-CPP as Pharmacist  Chief Complaint  Patient presents with   pulmonary consult    Per Dr. Quillian Quince with exertion and at rest and dry cough in the morning.     HPI 86 year old male former minimal smoker seen for pulmonary consult for evaluation of shortness of breath Medical history significant for diabetes chronic kidney diseaseStage IV, peripheral vascular disease, autonomic dysfunction and coronary artery disease, CHF.  Patient is kindly referred by Dr. Althea Charon due to dyspnea on exertion and at rest and dry cough in the morning.  The patient states that he feels fatigued more than anything just feels like he "cannot go".  Cough is dry at times.  No hemoptysis.  As noted no sputum production.  The patient notes that when he stands up his whole body "shakes" he also notices that he breathes hard when he exerts himself.  He has noted also some fluid retention particularly in the lower extremities.  He has a history of kidney disease and feels that his abdomen is developing "belly fat".  He does not describe any orthopnea or paroxysmal nocturnal dyspnea.  He is actually as sleep he has no difficulties.  Has chronic nasal congestion and sometimes this causes him difficulty breathing through his nose.  No chest pain per se.  No tachypalpitations.  No recent chest imaging.  He has upcoming appointment with nephrology.  Review of Systems A 10 point review of systems was performed and it is as noted above otherwise negative.  Past Medical History:  Diagnosis Date   Actinic keratosis    Allergy    Basal  cell carcinoma 08/29/2018   L post inf ear/excision   Basal cell carcinoma 08/05/2020   L lat forehead above the brow - [facility]   Chronic kidney disease    GERD (gastroesophageal reflux disease)    Hyperlipidemia    Hypertension    Squamous cell carcinoma of skin 03/26/2015   L dorsal hand   Squamous cell carcinoma of skin 06/17/2015   R cheek   Squamous cell carcinoma of skin 05/10/2019   R hand dorsum/in situ   Squamous cell carcinoma of skin 08/05/2020   R of mideline forehead - [facility]   Thyroid disease    Type 2 diabetes mellitus (HCC)    Vertigo    Wears hearing aid in both ears    Past Surgical History:  Procedure Laterality Date   CATARACT EXTRACTION W/PHACO Left 04/03/2021   Procedure: CATARACT EXTRACTION PHACO AND INTRAOCULAR LENS PLACEMENT (IOC) LEFT DIABETIC 17.22 01:53.3 15.2%;  Surgeon: Lockie Mola, MD;  Location: [facility];  Service: Ophthalmology;  Laterality: Left;  Diabetic - insulin   EYE SURGERY     Patient Active Problem List   Diagnosis Date Noted   SOB (shortness of breath) on exertion 02/02/2022   Type 2 diabetes mellitus with diabetic chronic kidney disease (HCC) 01/28/2022   Type 2 diabetes mellitus with diabetic neuropathy (HCC) 03/10/2021   Chronic kidney disease, stage 4 (severe) (HCC) 03/10/2021   Chronic frontal sinusitis 11/17/2020   Flu vaccine need 10/16/2020   Episodic lightheadedness 04/01/2020   Type 2 diabetes mellitus with  both eyes affected by mild nonproliferative retinopathy without macular edema, with long-term current use of insulin (HCC) 12/11/2019   Background diabetic retinopathy associated with type 2 diabetes mellitus (HCC) 09/27/2019   Type 2 diabetes mellitus with diabetic nephropathy, with long-term current use of insulin (HCC) 02/14/2019   Flatulence 11/03/2016   Multiple actinic keratoses 05/05/2015   Arthropathia 05/05/2015   CAFL (chronic airflow limitation) (HCC) 05/05/2015   Impotence of organic origin  05/05/2015   Gastro-esophageal reflux disease without esophagitis 05/05/2015   Retinopathy 12/23/2014   Microalbuminuria 12/23/2014   CKD (chronic kidney disease), stage IV (HCC) 05/07/2010   Allergic rhinitis 05/14/2008   Benign hypertension with CKD (chronic kidney disease) stage IV (HCC) 10/18/2007   HLD (hyperlipidemia) 09/06/2007   Family History  Problem Relation Age of Onset   Diabetes Mother    Diabetes Father    Stroke Father    Diabetes Brother    Diabetes Sister    Diabetes Sister    Diabetes Brother    Diabetes Brother    Diabetes Brother    Cancer Brother    Diabetes Brother    Social History   Tobacco Use   Smoking status: Former    Packs/day: 0.30    Years: 25.00    Pack years: 7.50    Types: Cigarettes    Quit date: 12/19/1978    Years since quitting: 43.2   Smokeless tobacco: Former  Substance Use Topics   Alcohol use: Yes    Alcohol/week: 0.0 standard drinks   No Known Allergies  Current Meds  Medication Sig   aspirin 81 MG EC tablet Take 81 mg by mouth daily   Continuous Blood Gluc Receiver (FREESTYLE LIBRE 2 READER) DEVI by Does not apply route.   Continuous Blood Gluc Sensor (FREESTYLE LIBRE 14 DAY SENSOR) MISC 1 kit.   Emollient (COCOA BUTTER) LOTN Apply topically.   Emollient (GOLD BOND ULTIMATE) LOTN Apply topically at bedtime. Diabetic lotion   famotidine (PEPCID) 10 MG tablet Take 1 tablet (10 mg total) by mouth daily.   gabapentin (NEURONTIN) 300 MG capsule TAKE 1 CAPSULE BY MOUTH EVERY NIGHT AS NEEDED FOR FOOT PAIN   hydrochlorothiazide (HYDRODIURIL) 25 MG tablet TAKE 1 TABLET(25 MG) BY MOUTH DAILY   insulin aspart (NOVOLOG) 100 UNIT/ML FlexPen Take NovoLog insulin 10-15 minutes BEFORE you eat.  Take NovoLog 40 units before breakfast Take NovoLog 15 units before lunch Take NovoLog 40 units before supper.  Adjust for CBG <70 following direction from Endocrinologist   insulin detemir (LEVEMIR) 100 UNIT/ML FlexPen Inject into the skin.  Inject 15 Units subcutaneously nightly   montelukast (SINGULAIR) 10 MG tablet Take 1 tablet (10 mg total) by mouth at bedtime.   MULTIPLE VITAMIN PO Take by mouth daily.    Multiple Vitamins-Minerals (PX COMPLETE SENIOR MULTIVITS) TABS Take by mouth.   ONE TOUCH ULTRA TEST test strip 1 each by Other route as needed.    pravastatin (PRAVACHOL) 10 MG tablet TAKE 1 TABLET BY MOUTH EVERY DAY   SALINE MIST SPRAY NA Place into the nose.   vitamin B-12 (CYANOCOBALAMIN) 500 MCG tablet Take 500 mcg by mouth daily.   Immunization History  Administered Date(s) Administered   Fluad Quad(high Dose 65+) 11/02/2019, 10/16/2020, 12/29/2021   Influenza, High Dose Seasonal PF 10/03/2015, 11/03/2016, 10/06/2017, 11/06/2018   PFIZER(Purple Top)SARS-COV-2 Vaccination 01/14/2020, 02/04/2020   Pneumococcal Conjugate-13 12/24/2014   Pneumococcal Polysaccharide-23 12/25/2012   Td 07/02/2015   Zoster, Live 12/25/2012       Objective:  Physical Exam BP 124/60 (BP Location: Left Arm, Cuff Size: Large)   Pulse 71   Temp 97.6 F (36.4 C) (Temporal)   Ht 5\' 6"  (1.676 m)   Wt 193 lb 6.4 oz (87.7 kg)   SpO2 99%   BMI 31.22 kg/m  GENERAL: Overweight elderly gentleman, appears tachypneic and anxious.  Tremulous throughout entire body pulm standing. HEAD: Normocephalic, atraumatic.  EYES: Pupils equal, round, reactive to light.  No scleral icterus.  MOUTH: Dentition intact, oral mucosa moist. NECK: Supple. No thyromegaly. Trachea midline. No JVD.  No adenopathy. PULMONARY: Good air entry bilaterally.  No adventitious sounds. CARDIOVASCULAR: S1 and S2. Regular rate and rhythm.  No rubs, murmurs or gallops heard. ABDOMEN: Obese, no ascites, otherwise benign. MUSCULOSKELETAL: No joint deformity, no clubbing, trace edema of the LEs.  NEUROLOGIC: Tremulous when standing up.  No overt focal deficit.  No asterixis. SKIN: Intact,warm,dry. PSYCH: Anxious, tearful at times.  Ambulatory oximetry was performed:  Patient could only ambulate 500 feet sleep due to anxiety.  Oxygen saturations remained 99 to 98%.  Heart rate remained between 65 bpm to 85 bpm.    Assessment & Plan:     ICD-10-CM   1. SOB (shortness of breath) on exertion  R06.02 NM Pulmonary Perfusion    Pulmonary Function Test [facility] Only    Pulmonary Function Test [facility] Only   Unclear etiology Patient expresses more fatigue Will get PFTs, ABG and VQ scan    2. Chronic kidney disease, stage 4 (severe) (HCC)  N18.4    Query if patient has advanced to stage V Has upcoming appointment with nephrology    3. Type 2 diabetes mellitus with stage 4 chronic kidney disease, with long-term current use of insulin (HCC)  E11.22    N18.4    Z79.4    This issue adds complexity to his management     Orders Placed This Encounter  Procedures   NM Pulmonary Perfusion    cxr prior to perfusion    Standing Status:   Future    Number of Occurrences:   1    Standing Expiration Date:   03/27/2023    Scheduling Instructions:     Next available.    Order Specific Question:   If indicated for the ordered procedure, I authorize the administration of a radiopharmaceutical per Radiology protocol    Answer:   Yes    Order Specific Question:   Preferred imaging location?    Answer:   [facility]   Pulmonary Function Test [facility] Only    Standing Status:   Future    Number of Occurrences:   1    Standing Expiration Date:   03/27/2023    Scheduling Instructions:     Next available.    Order Specific Question:   Full PFT: includes the following: basic spirometry, spirometry pre & post bronchodilator, diffusion capacity (DLCO), lung volumes    Answer:   Full PFT   Pulmonary Function Test [facility] Only    Standing Status:   Future    Number of Occurrences:   1    Standing Expiration Date:   03/27/2023    Scheduling Instructions:     ABG--next available.    Order Specific Question:   ABG    Answer:   Yes   The etiology of the patient's dyspnea is not  clear from this initial evaluation.  It appears that there is a component of anxiety/perhaps dysautonomic sort of issues.  In addition cannot exclude volume  overload in a patient with impaired renal function.  He is to have an upcoming appointment with his renal physician query whether he is getting close to ESRD.  We have ordered PFTs, arterial blood gas and pulmonary perfusion study to rule out potential chronic PE.  Will see the patient in follow-up in 4 to 6 weeks time he is to contact us prior to that time should any new difficulties arise.  Gailen Shelter, MD Advanced Bronchoscopy PCCM [facility]    *This note was dictated using voice recognition software/Dragon.  Despite best efforts to proofread, errors can occur which can change the meaning. Any transcriptional errors that result from this process are unintentional and may not be fully corrected at the time of dictation.

## 2022-03-30 ENCOUNTER — Other Ambulatory Visit: Payer: Self-pay | Admitting: *Deleted

## 2022-03-30 DIAGNOSIS — R0602 Shortness of breath: Secondary | ICD-10-CM

## 2022-04-05 ENCOUNTER — Other Ambulatory Visit: Payer: Self-pay | Admitting: Respiratory Therapy

## 2022-04-05 ENCOUNTER — Ambulatory Visit
Admission: RE | Admit: 2022-04-05 | Discharge: 2022-04-05 | Disposition: A | Discharge status: Home / Self Care | Payer: Medicare HMO | Source: Ambulatory Visit | Attending: Pulmonary Disease | Admitting: Pulmonary Disease

## 2022-04-05 ENCOUNTER — Ambulatory Visit: Payer: Medicare HMO

## 2022-04-05 ENCOUNTER — Other Ambulatory Visit: Payer: Self-pay | Admitting: Pulmonary Disease

## 2022-04-05 DIAGNOSIS — R0602 Shortness of breath: Secondary | ICD-10-CM

## 2022-04-05 LAB — BLOOD GAS, ARTERIAL
Acid-base deficit: 2 mmol/L (ref 0.0–2.0)
Bicarbonate: 20.6 mmol/L (ref 20.0–28.0)
FIO2: 0.21 %
O2 Saturation: 98.8 %
Patient temperature: 37
pCO2 arterial: 29 mmHg — ABNORMAL LOW (ref 32–48)
pH, Arterial: 7.46 — ABNORMAL HIGH (ref 7.35–7.45)
pO2, Arterial: 88 mmHg (ref 83–108)

## 2022-04-05 MED ORDER — TECHNETIUM TO 99M ALBUMIN AGGREGATED
4.0000 | Freq: Once | INTRAVENOUS | Status: AC | PRN
  Administered 2022-04-05: 4.36 via INTRAVENOUS

## 2022-04-13 LAB — HEMOGLOBIN A1C: Hemoglobin A1C: 8.7

## 2022-04-16 ENCOUNTER — Ambulatory Visit (INDEPENDENT_AMBULATORY_CARE_PROVIDER_SITE_OTHER): Payer: Medicare HMO | Admitting: Pharmacist

## 2022-04-16 DIAGNOSIS — E1121 Type 2 diabetes mellitus with diabetic nephropathy: Secondary | ICD-10-CM

## 2022-04-16 DIAGNOSIS — I129 Hypertensive chronic kidney disease with stage 1 through stage 4 chronic kidney disease, or unspecified chronic kidney disease: Secondary | ICD-10-CM

## 2022-04-16 NOTE — Patient Instructions (Signed)
Visit Information ? ?Thank you for taking time to visit with me today. Please don't hesitate to contact me if I can be of assistance to you before our next scheduled telephone appointment. ? ?Following are the goals we discussed today:  ? Goals Addressed   ? ?  ?  ?  ?  ? This Visit's Progress  ?  Pharmacy Goals     ?  Our goal A1c is less than 7%. This corresponds with fasting sugars less than 130 and 2 hour after meal sugars less than 180. Please continue to monitor home blood sugar. ? ?Our goal bad cholesterol, or LDL, is less than 70. This is why it is important to continue taking your pravastatin. ? ?Please check your home blood pressure, keep a log of the results and have this record for [place] to review during our next telephone appointment ? ?Feel free to call me with any questions or concerns. I look forward to our next call! ? ? ?Wallace Cullens, PharmD, BCACP, CPP ?Clinical Pharmacist ?[facility] ?[place] ?[phone] ?  ? ?  ? ? ? ?Our next appointment is by telephone on 05/21/2022 at 10:00 AM ? ?Please call the care guide team at [phone] if you need to cancel or reschedule your appointment.  ? ? ?Patient verbalizes understanding of instructions and care plan provided today and agrees to view in [place]. Active MyChart status confirmed with patient.   ?

## 2022-04-16 NOTE — Chronic Care Management (AMB) (Signed)
? ?Chronic Care Management ?CCM Pharmacy Note ? ?04/16/2022 ?Name:  Stephen White MRN:  [id number] DOB:  09-10-1936 ? ? ?Subjective: ?Stephen White is an 86 y.o. year old male who is a primary patient of Olin Hauser, DO.  The CCM team was consulted for assistance with disease management and care coordination needs.   ? ?Engaged with patient by telephone for follow up visit for pharmacy case management and/or care coordination services.  ? ?Objective: ? ?Medications Reviewed Today   ? ? Reviewed by Rennis Petty, RPH-CPP (Pharmacist) on 04/16/22 at 1031  Med List Status: <None>  ? ?Medication Order Taking? Sig Documenting Provider Last Dose Status Informant  ?albuterol (VENTOLIN HFA) 108 (90 Base) MCG/ACT inhaler [id number] Yes Inhale 2 puffs into the lungs every 6 (six) hours as needed for wheezing or shortness of breath. [provider] Taking Active   ?aspirin 81 MG EC tablet [id number]  Take 81 mg by mouth daily [provider]  Active   ?Carboxymethylcellulose Sodium (EYE DROPS OP) [id number]  Apply to eye.   ?Patient not taking: Reported on 03/26/2022  ? [provider]  Active   ?Continuous Blood Gluc Receiver (FREESTYLE LIBRE 2 READER) DEVI [id number]  by Does not apply route. [provider]  Active   ?Continuous Blood Gluc Sensor (FREESTYLE LIBRE 14 DAY SENSOR) [place] [id number]  1 kit. [provider]  Active   ?Emollient (COCOA BUTTER) LOTN [id number]  Apply topically. [provider]  Active   ?Emollient (GOLD BOND ULTIMATE) LOTN [id number]  Apply topically at bedtime. Diabetic lotion [provider]  Active   ?famotidine (PEPCID) 10 MG tablet [id number]  Take 1 tablet (10 mg total) by mouth daily. Olin Hauser, DO  Active   ?gabapentin (NEURONTIN) 300 MG capsule [id number]  TAKE 1 CAPSULE BY MOUTH EVERY NIGHT AS NEEDED FOR FOOT PAIN [provider]  Active   ?hydrochlorothiazide (HYDRODIURIL) 25 MG tablet  [id number] Yes TAKE 1 TABLET(25 MG) BY MOUTH DAILY [place] Ranger, Devonne Doughty, DO Taking Active   ?insulin aspart (NOVOLOG) 100 UNIT/ML FlexPen [id number]  Take NovoLog insulin 10-15 minutes BEFORE you eat.  ?Take NovoLog 40 units before breakfast ?Take NovoLog 15 units before lunch ?Take NovoLog 40 units before supper.  ?Adjust for CBG <70 following direction from Endocrinologist [provider]  Active   ?insulin detemir (LEVEMIR) 100 UNIT/ML FlexPen [id number]  Inject into the skin. Inject 15 Units subcutaneously nightly [provider]  Active   ?losartan (COZAAR) 100 MG tablet [id number] Yes TAKE 1 TABLET(100 MG) BY MOUTH DAILY Karamalegos, Devonne Doughty, DO Taking Active   ?montelukast (SINGULAIR) 10 MG tablet [id number] Yes Take 1 tablet (10 mg total) by mouth at bedtime. Olin Hauser, DO Taking Active   ?MULTIPLE VITAMIN PO [id number]  Take by mouth daily.  [provider]  Active   ?         ?Med Note Emmaline Kluver May 05, 2015 12:22 PM) Received from: [employer]  ?Multiple Vitamins-Minerals ([place] COMPLETE SENIOR MULTIVITS) TABS [id number]  Take by mouth. [provider]  Active   ?ONE TOUCH ULTRA TEST test strip [id number]  1 each by Other route as needed.  [provider]  Active   ?         ?Med Note Doristine Devoid   Fri Oct 03, 2015  9:40 AM) Received from: External Pharmacy  ?pravastatin (PRAVACHOL) 10 MG tablet [id number]  TAKE 1 TABLET BY  MOUTH EVERY DAY Olin Hauser, DO  Active   ?SALINE MIST SPRAY NA [id number]  Place into the nose. [provider]  Active   ?vitamin B-12 (CYANOCOBALAMIN) 500 MCG tablet [id number]  Take 500 mcg by mouth daily. [provider]  Active   ? ?  ?  ? ?  ? ? ?Pertinent Labs:  ?Lab Results  ?Component Value Date  ? HGBA1C 8.1 12/31/2020  ?Latest A1C per shared record with [place]: 8.7% on 04/13/2022 ? ?Lab Results  ?Component Value Date  ?  CHOL 152 08/01/2020  ? HDL 56 08/01/2020  ? [place] 82 08/01/2020  ? TRIG 68 08/01/2020  ? CHOLHDL 2.7 08/01/2020  ? ?Lab Results  ?Component Value Date  ? CREATININE 2.31 (H) 03/09/2021  ? BUN 49 (H) 03/09/2021  ? NA 137 03/09/2021  ? K 4.1 03/09/2021  ? CL 103 03/09/2021  ? CO2 26 03/09/2021  ? ? ?SDOH:  (Social Determinants of Health) assessments and interventions performed:  ? ? ?CCM Care Plan ? ?Review of patient past medical history, allergies, medications, health status, including review of consultants reports, laboratory and other test data, was performed as part of comprehensive evaluation and provision of chronic care management services.  ? ?Care Plan : PharmD - Medication Managment and Assistance  ?Updates made by Rennis Petty, RPH-CPP since 04/16/2022 12:00 AM  ?  ? ?Problem: Disease Progression   ?  ? ?Long-Range Goal: Disease Progression Prevented or Minimized   ?Start Date: 02/02/2021  ?Expected End Date: 05/03/2021  ?Recent Progress: On track  ?Priority: High  ?Note:   ?Current Barriers:  ?Financial Barriers in complicated patient with multiple medical conditions including T2DM, CKD, GERD, HTN, HLD; patient has [employer] and reports copay for [place] is cost prohibitive at this time ?Approved to receive Novolog and Levemir from [employer] patient assistance program through 12/19/2022 ?Fear of hypoglycemia ? ?Pharmacist Clinical Goal(s):  ?Over the next 90 days, patient will achieve adherence to monitoring guidelines and medication adherence to achieve therapeutic efficacy through collaboration with PharmD and provider.  ? ?Interventions: ?1:1 collaboration with Olin Hauser, DO regarding development and update of comprehensive plan of care as evidenced by provider attestation and co-signature ?Inter-disciplinary care team collaboration (see longitudinal plan of care) ?Perform chart review.  ?Office Visit with [facility] on 4/25. Provider  advised patient: ?Stressed importance of compliance with NovoLog 40 units before the breakfast and especially supper meals. Continue 15 units before lunch meal or snack.  ?Recommended that if he is not hungry, then he doesn't have to eat a snack.  ?NovoLog is only to be taken before meals and that again he is to not take it if not eating and not take it after eating ?Office Visit with [facility] on 4/12 ?Office Visit with [facility] on 4/7 ?May continue using your rescue inhaler. ?PFTs scheduled for 04/20/2022 ?Next appointment with Pulmonology on 5/12 ? ?Type 2 Diabetes ?Patient [facility] by [facility] for management ?Uncontrolled; current treatment: ?Levemir: 15 units nightly ?NovoLog: ?40 units before breakfast ?15 units before lunch  ?40 units before supper ?Patient verbalizes understanding that if blood glucose <70 before meal, take 1/2 scheduled dose for Novolog, as directed by Endocrinologist ?Counsel patient on importance of taking insulin regimen as directed by Endocrinologist and calling Endocrinology as needed for concerns related to his blood sugar ?Have counseled on s/s of low blood sugar, prevention of lows and  how to manage lows ?Patient using Freestyle Libre 2 monitor for low blood sugar alarm ?To administer Novolog doses within 10 minutes prior to meals ?Patient using FreeStyle Libre 2 continuous glucose monitor ?Orders sensor refills from [employer] ?Target goal range: 80-180 mg/dL ?Alarms set to go off for readings outside of range: 70-240 mg/dL ?Today reports is controlling his carbohydrates and sweets and avoiding fried foods ?Counsel on low carbohydrate/low sodium snack options ? ?Shortness of breath: ?Patient followed by [facility] ?Current treatment: albuterol inhaler as needed ?Confirms planning to attend upcoming appointment for PFTs as ordered by Pulmonologist  ? ?Hypertension ?Current regimen: ?HCTZ 25 mg  daily ?Losartan 100 mg daily ?Denies monitoring home blood pressure recently  ?Have counseled patient to monitor home BP, keep log and have record to review during medical appointments, to call office if readin

## 2022-04-18 DIAGNOSIS — N184 Chronic kidney disease, stage 4 (severe): Secondary | ICD-10-CM | POA: Diagnosis not present

## 2022-04-18 DIAGNOSIS — Z794 Long term (current) use of insulin: Secondary | ICD-10-CM | POA: Diagnosis not present

## 2022-04-18 DIAGNOSIS — E1121 Type 2 diabetes mellitus with diabetic nephropathy: Secondary | ICD-10-CM

## 2022-04-18 DIAGNOSIS — I129 Hypertensive chronic kidney disease with stage 1 through stage 4 chronic kidney disease, or unspecified chronic kidney disease: Secondary | ICD-10-CM

## 2022-04-19 ENCOUNTER — Telehealth: Payer: Self-pay | Admitting: Pulmonary Disease

## 2022-04-19 NOTE — Telephone Encounter (Signed)
Chest x-ray and perfusion scan were negative for clot or pulmonary disease.  Awaiting results of PFTs.  Do not think that his symptoms are coming from the lungs but mostly due to issues with fatigue due to his poor kidney function. ?

## 2022-04-19 NOTE — Telephone Encounter (Signed)
Patient's spouse, Patricia(DPR)is aware of results and voiced her understanding.  ?Nothing further needed.  ? ?

## 2022-04-19 NOTE — Telephone Encounter (Signed)
Spoke to patient's spouse, Patricia(DPR). ?She is requesting results of perfusion test and CXR. ? ?Dr. Patsey Berthold, please advise. Thanks ?

## 2022-04-20 ENCOUNTER — Ambulatory Visit: Payer: Medicare HMO | Attending: Pulmonary Disease

## 2022-04-20 DIAGNOSIS — R0602 Shortness of breath: Secondary | ICD-10-CM | POA: Diagnosis not present

## 2022-04-20 DIAGNOSIS — R059 Cough, unspecified: Secondary | ICD-10-CM | POA: Insufficient documentation

## 2022-04-20 LAB — PULMONARY FUNCTION TEST ARMC ONLY
DL/VA % pred: 98 %
DL/VA: 3.84 ml/min/mmHg/L
DLCO unc % pred: 84 %
DLCO unc: 17.95 ml/min/mmHg
FEF 25-75 Post: 2.2 L/sec
FEF 25-75 Pre: 1.66 L/sec
FEF2575-%Change-Post: 32 %
FEF2575-%Pred-Post: 150 %
FEF2575-%Pred-Pre: 113 %
FEV1-%Change-Post: 10 %
FEV1-%Pred-Post: 95 %
FEV1-%Pred-Pre: 86 %
FEV1-Post: 2.15 L
FEV1-Pre: 1.95 L
FEV1FVC-%Change-Post: 1 %
FEV1FVC-%Pred-Pre: 105 %
FEV6-%Change-Post: 9 %
FEV6-%Pred-Post: 94 %
FEV6-%Pred-Pre: 86 %
FEV6-Post: 2.83 L
FEV6-Pre: 2.59 L
FEV6FVC-%Pred-Post: 108 %
FEV6FVC-%Pred-Pre: 108 %
FVC-%Change-Post: 8 %
FVC-%Pred-Post: 87 %
FVC-%Pred-Pre: 80 %
FVC-Post: 2.83 L
FVC-Pre: 2.6 L
Post FEV1/FVC ratio: 76 %
Post FEV6/FVC ratio: 100 %
Pre FEV1/FVC ratio: 75 %
Pre FEV6/FVC Ratio: 100 %
RV % pred: 93 %
RV: 2.33 L
TLC % pred: 80 %
TLC: 5.05 L

## 2022-04-20 MED ORDER — ALBUTEROL SULFATE (2.5 MG/3ML) 0.083% IN NEBU
2.5000 mg | INHALATION_SOLUTION | Freq: Once | RESPIRATORY_TRACT | Status: AC
Start: 2022-04-20 — End: 2022-04-20
  Administered 2022-04-20: 2.5 mg via RESPIRATORY_TRACT
  Filled 2022-04-20: qty 3

## 2022-04-26 ENCOUNTER — Ambulatory Visit: Payer: Medicare HMO | Admitting: Dermatology

## 2022-04-26 ENCOUNTER — Other Ambulatory Visit: Payer: Self-pay | Admitting: Family Medicine

## 2022-04-26 ENCOUNTER — Encounter: Payer: Self-pay | Admitting: Dermatology

## 2022-04-26 DIAGNOSIS — L570 Actinic keratosis: Secondary | ICD-10-CM

## 2022-04-26 DIAGNOSIS — L820 Inflamed seborrheic keratosis: Secondary | ICD-10-CM

## 2022-04-26 DIAGNOSIS — L578 Other skin changes due to chronic exposure to nonionizing radiation: Secondary | ICD-10-CM

## 2022-04-26 MED ORDER — FLUOROURACIL 5 % EX CREA: TOPICAL_CREAM | CUTANEOUS | 0 refills | Status: DC

## 2022-04-26 NOTE — Progress Notes (Signed)
Follow-Up Visit   Subjective  Stephen White is a 86 y.o. male who presents for the following: Actinic Keratosis (6 month recheck. Face. Last treated with LN2. Patient states he cannot tolerate PDT treatment ). The patient has spots, moles and lesions to be evaluated, some may be new or changing and the patient has concerns that these could be cancer.  The following portions of the chart were reviewed this encounter and updated as appropriate:  Tobacco  Allergies  Meds  Problems  Med Hx  Surg Hx  Fam Hx     Review of Systems: No other skin or systemic complaints except as noted in HPI or Assessment and Plan.  Objective  Well appearing patient in no apparent distress; mood and affect are within normal limits.  A focused examination was performed including scalp, face, ears, neck, arms, hands. Relevant physical exam findings are noted in the Assessment and Plan.  face x 16, right nasal ala x1 (17) Erythematous thin papules/macules with gritty scale.   Right Posterior Ear x1 Erythematous keratotic or waxy stuck-on papule or plaque.   Assessment & Plan  AK (actinic keratosis) (17) face x 16, right nasal ala x1  Actinic keratoses are precancerous spots that appear secondary to cumulative UV radiation exposure/sun exposure over time. They are chronic with expected duration over 1 year. A portion of actinic keratoses will progress to squamous cell carcinoma of the skin. It is not possible to reliably predict which spots will progress to skin cancer and so treatment is recommended to prevent development of skin cancer.  Recommend daily broad spectrum sunscreen SPF 30+ to sun-exposed areas, reapply every 2 hours as needed.  Recommend staying in the shade or wearing long sleeves, sun glasses (UVA+UVB protection) and wide brim hats (4-inch brim around the entire circumference of the hat). Call for new or changing lesions.  Recheck right nasal ala at next visit.   Start  5-fluorouracil/calcipotriene cream twice a day for 7 days to affected areas including forehead, temples, right nose. Prescription sent to [facility]. Patient provided with contact information for pharmacy and advised the pharmacy will mail the prescription to their home. Patient provided with handout reviewing treatment course and side effects and advised to call or message [place] on MyChart with any concerns.   Discussed side effects with patient.   Destruction of lesion - face x 16, right nasal ala x1 Complexity: simple   Destruction method: cryotherapy   Informed consent: discussed and consent obtained   Timeout:  patient name, date of birth, surgical site, and procedure verified Lesion destroyed using liquid nitrogen: Yes   Region frozen until ice ball extended beyond lesion: Yes   Outcome: patient tolerated procedure well with no complications   Post-procedure details: wound care instructions given    fluorouracil (EFUDEX) 5 % cream - face x 16, right nasal ala x1 Apply twice a day for 7 days to affected areas including forehead, temples, right nose.  Inflamed seborrheic keratosis Right Posterior Ear x1  Destruction of lesion - Right Posterior Ear x1 Complexity: simple   Destruction method: cryotherapy   Informed consent: discussed and consent obtained   Timeout:  patient name, date of birth, surgical site, and procedure verified Lesion destroyed using liquid nitrogen: Yes   Region frozen until ice ball extended beyond lesion: Yes   Outcome: patient tolerated procedure well with no complications   Post-procedure details: wound care instructions given    Actinic Damage - Severe, confluent actinic changes with  pre-cancerous actinic keratoses  - Severe, chronic, not at goal, secondary to cumulative UV radiation exposure over time - diffuse scaly erythematous macules and papules with underlying dyspigmentation - Discussed Prescription "Field Treatment" for Severe, Chronic  Confluent Actinic Changes with Pre-Cancerous Actinic Keratoses Field treatment involves treatment of an entire area of skin that has confluent Actinic Changes (Sun/ Ultraviolet light damage) and PreCancerous Actinic Keratoses by method of PhotoDynamic Therapy (PDT) and/or prescription Topical Chemotherapy agents such as 5-fluorouracil, 5-fluorouracil/calcipotriene, and/or imiquimod.  The purpose is to decrease the number of clinically evident and subclinical PreCancerous lesions to prevent progression to development of skin cancer by chemically destroying early precancer changes that may or may not be visible.  It has been shown to reduce the risk of developing skin cancer in the treated area. As a result of treatment, redness, scaling, crusting, and open sores may occur during treatment course. One or more than one of these methods may be used and may have to be used several times to control, suppress and eliminate the PreCancerous changes. Discussed treatment course, expected reaction, and possible side effects. - Recommend daily broad spectrum sunscreen SPF 30+ to sun-exposed areas, reapply every 2 hours as needed.  - Staying in the shade or wearing long sleeves, sun glasses (UVA+UVB protection) and wide brim hats (4-inch brim around the entire circumference of the hat) are also recommended. - Call for new or changing lesions.   Start 5-fluorouracil/calcipotriene cream twice a day for 7 days to affected areas including forehead, temples, right nose. Prescription sent to [facility]. Patient provided with contact information for pharmacy and advised the pharmacy will mail the prescription to their home. Patient provided with handout reviewing treatment course and side effects and advised to call or message [place] on MyChart with any concerns.   Return in about 3 months (around 07/27/2022) for AK Follow Up.  I, Emelia Salisbury, CMA, am acting as scribe for Sarina Ser, MD. Documentation: I have reviewed  the above documentation for accuracy and completeness, and I agree with the above.  Sarina Ser, MD

## 2022-04-26 NOTE — Patient Instructions (Addendum)
Cryotherapy Aftercare ? ?Wash gently with soap and water everyday.   ?Apply Vaseline and Band-Aid daily until healed.  ? ?Prior to procedure, discussed risks of blister formation, small wound, skin dyspigmentation, or rare scar following cryotherapy. Recommend Vaseline ointment to treated areas while healing.  ? ?Start 5-fluorouracil/calcipotriene cream twice a day for 7 days to affected areas including forehead, temples, right nose. Prescription sent to [facility]. Patient provided with contact information for pharmacy and advised the pharmacy will mail the prescription to their home. Patient provided with handout reviewing treatment course and side effects and advised to call or message [place] on MyChart with any concerns.  ? ? ?5-Fluorouracil/Calcipotriene Patient Education  ? ?Actinic keratoses are the dry, red scaly spots on the skin caused by sun damage. A portion of these spots can turn into skin cancer with time, and treating them can help prevent development of skin cancer.  ? ?Treatment of these spots requires removal of the defective skin cells. There are various ways to remove actinic keratoses, including freezing with liquid nitrogen, treatment with creams, or treatment with a blue light procedure in the office.  ? ?5-fluorouracil cream is a topical cream used to treat actinic keratoses. It works by interfering with the growth of abnormal fast-growing skin cells, such as actinic keratoses. These cells peel off and are replaced by healthy ones.  ? ?5-fluorouracil/calcipotriene is a combination of the 5-fluorouracil cream with a vitamin D analog cream called calcipotriene. The calcipotriene alone does not treat actinic keratoses. However, when it is combined with 5-fluorouracil, it helps the 5-fluorouracil treat the actinic keratoses much faster so that the same results can be achieved with a much shorter treatment time. ? ?INSTRUCTIONS FOR 5-FLUOROURACIL/CALCIPOTRIENE CREAM:   ? ?5-fluorouracil/calcipotriene cream typically only needs to be used for 4-7 days. A thin layer should be applied twice a day to the treatment areas recommended by your physician.  ? ?If your physician prescribed you separate tubes of 5-fluourouracil and calcipotriene, apply a thin layer of 5-fluorouracil followed by a thin layer of calcipotriene.  ? ?Avoid contact with your eyes, nostrils, and mouth. Do not use 5-fluorouracil/calcipotriene cream on infected or open wounds.  ? ?You will develop redness, irritation and some crusting at areas where you have pre-cancer damage/actinic keratoses. IF YOU DEVELOP PAIN, BLEEDING, OR SIGNIFICANT CRUSTING, STOP THE TREATMENT EARLY - you have already gotten a good response and the actinic keratoses should clear up well. ? ?Wash your hands after applying 5-fluorouracil 5% cream on your skin.  ? ?A moisturizer or sunscreen with a minimum SPF 30 should be applied each morning.  ? ?Once you have finished the treatment, you can apply a thin layer of Vaseline twice a day to irritated areas to soothe and calm the areas more quickly. If you experience significant discomfort, contact your physician. ? ?For some patients it is necessary to repeat the treatment for best results. ? ?SIDE EFFECTS: When using 5-fluorouracil/calcipotriene cream, you may have mild irritation, such as redness, dryness, swelling, or a mild burning sensation. This usually resolves within 2 weeks. The more actinic keratoses you have, the more redness and inflammation you can expect during treatment. Eye irritation has been reported rarely. If this occurs, please let us know.  ?If you have any trouble using this cream, please call the office. If you have any other questions about this information, please do not hesitate to ask me before you leave the office. ? ?If You Need Anything After Your Visit ? ?If you  have any questions or concerns for your doctor, please call our main line at [phone] and press  option 4 to reach your doctor's medical assistant. If no one answers, please leave a voicemail as directed and we will return your call as soon as possible. Messages left after 4 pm will be answered the following business day.  ? ?You may also send [place] a message via MyChart. We typically respond to MyChart messages within 1-2 business days. ? ?For prescription refills, please ask your pharmacy to contact our office. Our fax number is [phone]. ? ?If you have an urgent issue when the clinic is closed that cannot wait until the next business day, you can page your doctor at the number below.   ? ?Please note that while we do our best to be available for urgent issues outside of office hours, we are not available 24/7.  ? ?If you have an urgent issue and are unable to reach [place], you may choose to seek medical care at your doctor's office, retail clinic, urgent care center, or emergency room. ? ?If you have a medical emergency, please immediately call 911 or go to the emergency department. ? ?Pager Numbers ? ?- Dr. Nehemiah Massed: [phone] ? ?- Dr. Laurence Ferrari: [phone] ? ?- Dr. Nicole Kindred: [phone] ? ?In the event of inclement weather, please call our main line at [phone] for an update on the status of any delays or closures. ? ?Dermatology Medication Tips: ?Please keep the boxes that topical medications come in in order to help keep track of the instructions about where and how to use these. Pharmacies typically print the medication instructions only on the boxes and not directly on the medication tubes.  ? ?If your medication is too expensive, please contact our office at [phone] option 4 or send [place] a message through [place].  ? ?We are unable to tell what your co-pay for medications will be in advance as this is different depending on your insurance coverage. However, we may be able to find a substitute medication at lower cost or fill out paperwork to get insurance to cover a needed medication.  ? ?If a  prior authorization is required to get your medication covered by your insurance company, please allow [place] 1-2 business days to complete this process. ? ?Drug prices often vary depending on where the prescription is filled and some pharmacies may offer cheaper prices. ? ?The website www.goodrx.com contains coupons for medications through different pharmacies. The prices here do not account for what the cost may be with help from insurance (it may be cheaper with your insurance), but the website can give you the price if you did not use any insurance.  ?- You can print the associated coupon and take it with your prescription to the pharmacy.  ?- You may also stop by our office during regular business hours and pick up a GoodRx coupon card.  ?- If you need your prescription sent electronically to a different pharmacy, notify our office through [facility] or by phone at [phone] option 4. ? ? ? ? ?Si Usted Necesita Algo Despu?s de Su Visita ? ?Tambi?n puede enviarnos un mensaje a trav?s de MyChart. Por lo general respondemos a los mensajes de MyChart en el transcurso de 1 a 2 d?as h?biles. ? ?Para renovar recetas, por favor pida a su farmacia que se ponga en contacto con nuestra oficina. Nuestro n?mero de fax es el [phone]. ? ?Si tiene un asunto urgente cuando la cl?nica est? cerrada y  que no puede esperar hasta el siguiente d?a h?bil, puede llamar/localizar a su doctor(a) al n?mero que aparece a continuaci?n.  ? ?Por favor, tenga en cuenta que aunque hacemos todo lo posible para estar disponibles para asuntos urgentes fuera del horario de oficina, no estamos disponibles las 24 horas del d?a, los 7 d?as de la semana.  ? ?Si tiene un problema urgente y no puede comunicarse con nosotros, puede optar por buscar atenci?n m?dica  en el consultorio de su doctor(a), en una cl?nica privada, en un centro de atenci?n urgente o en una sala de emergencias. ? ?Si tiene Engineer, maintenance (IT) m?dica, por favor llame  inmediatamente al 911 o vaya a la sala de emergencias. ? ?N?meros de b?per ? ?- Dr. Nehemiah Massed: [phone] ? ?- Dra. Moye: [phone] ? ?- Dra. Nicole Kindred: [phone] ? ?En caso de inclemencias del tiempo, por favor llame a nuestra l?nea pr

## 2022-04-27 NOTE — Telephone Encounter (Signed)
Requested medication (s) are due for refill today: historical medication ? ?Requested medication (s) are on the active medication list: yes ? ?Last refill:  na ? ?Future visit scheduled: yes in 2 months ? ?Notes to clinic:  historical medication . Do you want to order Rx? ? ? ?  ?Requested Prescriptions  ?Pending Prescriptions Disp Refills  ? albuterol (VENTOLIN HFA) 108 (90 Base) MCG/ACT inhaler [Pharmacy Med Name: ALBUTEROL HFA INH (200 PUFFS) 6.7GM] 6.7 g   ?  Sig: INHALE 2 PUFFS INTO THE LUNGS EVERY 4 HOURS AS NEEDED FOR WHEEZING OR SHORTNESS OF BREATH  ?  ? Pulmonology:  Beta Agonists 2 Passed - 04/26/2022  4:39 PM  ?  ?  Passed - Last BP in normal range  ?  BP Readings from Last 1 Encounters:  ?03/26/22 124/60  ?  ?  ?  ?  Passed - Last Heart Rate in normal range  ?  Pulse Readings from Last 1 Encounters:  ?03/26/22 71  ?  ?  ?  ?  Passed - Valid encounter within last 12 months  ?  Recent Outpatient Visits   ? ?      ? 2 months ago Shortness of breath  ? [place], DO  ? 2 months ago Shortness of breath  ? [place], DO  ? 3 months ago Dyspnea on exertion  ? [place], DO  ? 5 months ago Acute non-recurrent frontal sinusitis  ? [place], DO  ? 1 year ago Dyspnea on exertion  ? [place], DO  ? ?  ?  ?Future Appointments   ? ?        ? In 2 months Parks Ranger, [facility], [place]  ? In 3 months Ralene Bathe, MD [place]  ? ?  ? ? ?  ?  ?  ? ?

## 2022-04-30 ENCOUNTER — Ambulatory Visit: Payer: Medicare HMO | Admitting: Adult Health

## 2022-04-30 ENCOUNTER — Encounter: Payer: Self-pay | Admitting: Adult Health

## 2022-04-30 ENCOUNTER — Other Ambulatory Visit
Admission: RE | Admit: 2022-04-30 | Discharge: 2022-04-30 | Disposition: A | Discharge status: Home / Self Care | Payer: Medicare HMO | Attending: Adult Health | Admitting: Adult Health

## 2022-04-30 VITALS — BP 122/60 | HR 72 | Temp 98.3°F | Ht 66.0 in | Wt 192.6 lb

## 2022-04-30 DIAGNOSIS — N184 Chronic kidney disease, stage 4 (severe): Secondary | ICD-10-CM

## 2022-04-30 DIAGNOSIS — J4520 Mild intermittent asthma, uncomplicated: Secondary | ICD-10-CM | POA: Diagnosis not present

## 2022-04-30 DIAGNOSIS — R0602 Shortness of breath: Secondary | ICD-10-CM

## 2022-04-30 DIAGNOSIS — R5383 Other fatigue: Secondary | ICD-10-CM | POA: Insufficient documentation

## 2022-04-30 DIAGNOSIS — R531 Weakness: Secondary | ICD-10-CM | POA: Insufficient documentation

## 2022-04-30 DIAGNOSIS — R0689 Other abnormalities of breathing: Secondary | ICD-10-CM | POA: Diagnosis not present

## 2022-04-30 DIAGNOSIS — F458 Other somatoform disorders: Secondary | ICD-10-CM | POA: Diagnosis not present

## 2022-04-30 DIAGNOSIS — J45909 Unspecified asthma, uncomplicated: Secondary | ICD-10-CM | POA: Insufficient documentation

## 2022-04-30 LAB — TSH: TSH: 1.921 u[IU]/mL (ref 0.350–4.500)

## 2022-04-30 LAB — AMMONIA: Ammonia: 18 umol/L (ref 9–35)

## 2022-04-30 LAB — BRAIN NATRIURETIC PEPTIDE: B Natriuretic Peptide: 42.3 pg/mL (ref 0.0–100.0)

## 2022-04-30 MED ORDER — LEVALBUTEROL TARTRATE 45 MCG/ACT IN AERO: 1.0000 | INHALATION_SPRAY | Freq: Four times a day (QID) | RESPIRATORY_TRACT | 1 refills | Status: DC | PRN

## 2022-04-30 NOTE — Progress Notes (Signed)
? ?_0  ID: Stephen White, male    DOB: 17-Feb-1936, 86 y.o.   MRN: [id number] ? ?Chief Complaint  ?Patient presents with  ? Follow-up  ? ? ?Referring provider: ?Nobie Putnam * ? ?HPI: ?86 year old male former minimal smoker seen for pulmonary consult March 26, 2022 for shortness of breath ?Medical history significant for diabetes chronic kidney diseaseStage IV, peripheral vascular disease, autonomic dysfunction and coronary artery disease, CHF  ? ?TEST/EVENTS : ?Echo 02/2022 nml EF , Nml RVSF , no valvular stenosis (care everywhere)  ? ? ?04/30/2022 Follow up : Dyspnea  ?Patient returns for 1 month follow-up.  Patient was seen last visit March 26, 2022 for shortness of breath.  Patient has an extensive medical history with diabetes, stage IV chronic kidney disease peripheral vascular disease and coronary artery disease and presumed diastolic congestive heart failure. ?Patient complains over the last couple years he has had episodes of shortness of breath that seem to come and go and have progressively worsened.  He has episodes where he gets very out of breath and has to pant to get any air.  ?Chest x-ray was clear.  Patient was set up for a VQ scan that was normal.ABG was essentially unrevealing with normal PO2, pH was 7.46 and PCO2 29.  O2 saturation was 98%.  ?CBC w/ no anemia . Eos ab elevated at 832.  ?PFTs show very mild asthma versus reactive airways with no real airflow obstruction or restriction.  There was some mild reversibility and small airway reversibility.  FEV1 is at 95%, ratio 76, FVC 87%.,  10% bronchodilator change, DLCO 84% ?Patient uses an albuterol inhaler when he gets these episodes of shortness of breath.  Patient says the episodes are very frightening he loses his breath completely and starts to shake.  Gets dizzy and lightheaded.  Patient has very low activity tolerance. ?He does have chronic rhinitis . Is on Singulair . He was tried on Symbicort and Anoro but was unable to  tolerate due to tremors and throat irritation.  ? ? ? ?No Known Allergies ? ?Immunization History  ?Administered Date(s) Administered  ? Fluad Quad(high Dose 65+) 11/02/2019, 10/16/2020, 12/29/2021  ? Influenza, High Dose Seasonal PF 10/03/2015, 11/03/2016, 10/06/2017, 11/06/2018  ? PFIZER(Purple Top)SARS-COV-2 Vaccination 01/14/2020, 02/04/2020  ? Pneumococcal Conjugate-13 12/24/2014  ? Pneumococcal Polysaccharide-23 12/25/2012  ? Td 07/02/2015  ? Zoster, Live 12/25/2012  ? ? ?Past Medical History:  ?Diagnosis Date  ? Actinic keratosis   ? Allergy   ? Basal cell carcinoma 08/29/2018  ? L post inf ear/excision  ? Basal cell carcinoma 08/05/2020  ? L lat forehead above the brow - [facility]  ? Chronic kidney disease   ? GERD (gastroesophageal reflux disease)   ? Hyperlipidemia   ? Hypertension   ? Squamous cell carcinoma of skin 03/26/2015  ? L dorsal hand  ? Squamous cell carcinoma of skin 06/17/2015  ? R cheek  ? Squamous cell carcinoma of skin 05/10/2019  ? R hand dorsum/in situ  ? Squamous cell carcinoma of skin 08/05/2020  ? R of mideline forehead - [facility]  ? Thyroid disease   ? Type 2 diabetes mellitus ([place])   ? Vertigo   ? Wears hearing aid in both ears   ? ? ?Tobacco History: ?Social History  ? ?Tobacco Use  ?Smoking Status Former  ? Packs/day: 0.30  ? Years: 25.00  ? Pack years: 7.50  ? Types: Cigarettes  ? Quit date: 12/19/1978  ? Years since quitting: 43.3  ?Smokeless  Tobacco Former  ? ?Counseling given: Not Answered ? ? ?Outpatient Medications Prior to Visit  ?Medication Sig Dispense Refill  ? aspirin 81 MG EC tablet Take 81 mg by mouth daily    ? Carboxymethylcellulose Sodium (EYE DROPS OP) Apply to eye.    ? Continuous Blood Gluc Receiver (FREESTYLE LIBRE 2 READER) DEVI by Does not apply route.    ? Continuous Blood Gluc Sensor (FREESTYLE LIBRE 14 DAY SENSOR) MISC 1 kit.    ? Emollient (COCOA BUTTER) LOTN Apply topically.    ? Emollient (GOLD BOND ULTIMATE) LOTN Apply topically at bedtime. Diabetic lotion    ?  famotidine (PEPCID) 10 MG tablet Take 1 tablet (10 mg total) by mouth daily. 90 tablet 0  ? fluorouracil (EFUDEX) 5 % cream Apply twice a day for 7 days to affected areas including forehead, temples, right nose. 15 g 0  ? gabapentin (NEURONTIN) 300 MG capsule TAKE 1 CAPSULE BY MOUTH EVERY NIGHT AS NEEDED FOR FOOT PAIN    ? hydrochlorothiazide (HYDRODIURIL) 25 MG tablet TAKE 1 TABLET(25 MG) BY MOUTH DAILY 90 tablet 1  ? insulin aspart (NOVOLOG) 100 UNIT/ML FlexPen Take NovoLog insulin 10-15 minutes BEFORE you eat.  ?Take NovoLog 40 units before breakfast ?Take NovoLog 15 units before lunch ?Take NovoLog 40 units before supper.  ?Adjust for CBG <70 following direction from Endocrinologist    ? insulin detemir (LEVEMIR) 100 UNIT/ML FlexPen Inject into the skin. Inject 15 Units subcutaneously nightly    ? losartan (COZAAR) 100 MG tablet TAKE 1 TABLET(100 MG) BY MOUTH DAILY 90 tablet 1  ? montelukast (SINGULAIR) 10 MG tablet Take 1 tablet (10 mg total) by mouth at bedtime. 90 tablet 3  ? MULTIPLE VITAMIN PO Take by mouth daily.     ? Multiple Vitamins-Minerals (PX COMPLETE SENIOR MULTIVITS) TABS Take by mouth.    ? ONE TOUCH ULTRA TEST test strip 1 each by Other route as needed.     ? pravastatin (PRAVACHOL) 10 MG tablet TAKE 1 TABLET BY MOUTH EVERY DAY 90 tablet 1  ? SALINE MIST SPRAY NA Place into the nose.    ? vitamin B-12 (CYANOCOBALAMIN) 500 MCG tablet Take 500 mcg by mouth daily.    ? albuterol (VENTOLIN HFA) 108 (90 Base) MCG/ACT inhaler INHALE 2 PUFFS INTO THE LUNGS EVERY 4 HOURS AS NEEDED FOR WHEEZING OR SHORTNESS OF BREATH 6.7 g 3  ? ?No facility-administered medications prior to visit.  ? ? ? ?Review of Systems:  ? ?Constitutional:   No  weight loss, night sweats,  Fevers, chills,  ?+fatigue, or  lassitude. ? ?HEENT:   No headaches,  Difficulty swallowing,  Tooth/dental problems, or  Sore throat,  ?              No sneezing, itching, ear ache, ?+ nasal congestion, post nasal drip,  ? ?CV:  No chest pain,   Orthopnea, PND, s+welling in lower extremities, no anasarca, dizziness, palpitations, syncope.  ? ?GI  No heartburn, indigestion, abdominal pain, nausea, vomiting, diarrhea, change in bowel habits, loss of appetite, bloody stools.  ? ?Resp:   No chest wall deformity ? ?Skin: no rash or lesions. ? ?GU: no dysuria, change in color of urine, no urgency or frequency.  No flank pain, no hematuria  ? ?MS:  No joint pain or swelling.  No decreased range of motion.  No back pain. ? ? ? ?Physical Exam ? ?BP 122/60 (BP Location: Left Arm, Cuff Size: Normal)   Pulse 72  Temp 98.3 ?F (36.8 ?C) (Temporal)   Ht _0  (1.676 m)   Wt 192 lb 9.6 oz (87.4 kg)   SpO2 97%   BMI 31.09 kg/m?  ? ?GEN: A/Ox3; pleasant , NAD elderly  ?  ?HEENT:  [place]/AT,   NOSE-clear, THROAT-clear, no lesions, no postnasal drip or exudate noted.  ? ?NECK:  Supple w/ fair ROM; no JVD; normal carotid impulses w/o bruits; no thyromegaly or nodules palpated; no lymphadenopathy.   ? ?RESP  Clear  P & A; w/o, wheezes/ rales/ or rhonchi. no accessory muscle use, no dullness to percussion, episodes of tachypnea.  ? ?CARD:  RRR, no m/r/g, 1+ peripheral edema, pulses intact, no cyanosis or clubbing. ? ?GI:   Soft & nt; nml bowel sounds; no organomegaly or masses detected.  ? ?Musco: Warm bil, no deformities or joint swelling noted.  ? ?Neuro: alert, no focal deficits noted.   ? ?Skin: Warm, no lesions or rashes ? ? ? ?Lab Results: ? ?CBC ?   ?Component Value Date/Time  ? WBC 8.6 03/09/2021 1616  ? RBC 4.39 03/09/2021 1616  ? HGB 13.7 03/09/2021 1616  ? HGB 13.8 11/07/2018 0958  ? HCT 40.9 03/09/2021 1616  ? HCT 41.2 11/07/2018 0958  ? PLT 288 03/09/2021 1616  ? PLT 278 11/07/2018 0958  ? MCV 93.2 03/09/2021 1616  ? MCV 92 11/07/2018 0958  ? MCH 31.2 03/09/2021 1616  ? MCHC 33.5 03/09/2021 1616  ? RDW 12.4 03/09/2021 1616  ? RDW 12.4 11/07/2018 0958  ? LYMPHSABS 2,608 08/01/2020 6269  ? LYMPHSABS 2.7 11/07/2018 0958  ? EOSABS 1,000 (H) 08/01/2020 4854  ? EOSABS  1.0 (H) 11/07/2018 6270  ? BASOSABS 139 08/01/2020 0838  ? BASOSABS 0.1 11/07/2018 0958  ? ? ?BMET ?   ?Component Value Date/Time  ? NA 137 03/09/2021 1616  ? NA 141 11/07/2018 0958  ? K 4.1 03/09/2021 1616  ? CL 103

## 2022-04-30 NOTE — Assessment & Plan Note (Signed)
Chronic dyspnea with hyperventilation questionable etiology.  This may be multifactorial with underlying metabolic issue along with chronic kidney disease.  Elevated PTH level. ?We will set up for a MRI brain to rule out a possible central etiology.  Check overnight oximetry for nocturnal hypoxemia.  Check labs with BNP, ammonia and TSH. ?Walk test in the office with no desaturations.  ABG shows no significant hypoxemia but notable hyperventilation with decreased PCO2.  Exam reveals multiple episodes of tachypnea/hyperventilation. ?We will change albuterol over to Xopenex.  Have advised him not to use unless he absolutely needs to as it seems to make his symptoms worse with more tremors and nervousness ? ?Plan  ?Marland Kitchen ?Patient Instructions  ?Set up MRI brain .  ?Change Albuterol to Xopenex 1 puff every 6hr as needed for dyspnea.  ?Labs today .  ?Set up Overnight oximetry test on room air.  ?Follow up with Dr. Patsey Berthold or Mckinley Olheiser in 4 weeks and .As needed   ?Please contact office for sooner follow up if symptoms do not improve or worsen or seek emergency care  ? ? ? ?  ? ?

## 2022-04-30 NOTE — Assessment & Plan Note (Signed)
Suspect he has some mild intermittent asthma with allergic phenotype.  CBC shows elevated eosinophils.  Unfortunately has been unable to tolerate inhalers including Symbicort, Anoro.  Has significant side effects with albuterol.  We will change albuterol to Xopenex as needed.  Continue on Singulair. ?PFTs show no significant airflow obstruction or restriction.  Some mild reversibility.  And small airways disease. ? ?Plan  ?Patient Instructions  ?Set up MRI brain .  ?Change Albuterol to Xopenex 1 puff every 6hr as needed for dyspnea.  ?Labs today .  ?Set up Overnight oximetry test on room air.  ?Follow up with Dr. Patsey Berthold or Rashi Giuliani in 4 weeks and .As needed   ?Please contact office for sooner follow up if symptoms do not improve or worsen or seek emergency care  ? ? ? ?  ? ?

## 2022-04-30 NOTE — Patient Instructions (Addendum)
Set up MRI brain .  ?Change Albuterol to Xopenex 1 puff every 6hr as needed for dyspnea.  ?Labs today .  ?Set up Overnight oximetry test on room air.  ?Follow up with Dr. Patsey Berthold or Juhi Lagrange in 4 weeks and .As needed   ?Please contact office for sooner follow up if symptoms do not improve or worsen or seek emergency care  ? ? ? ?

## 2022-04-30 NOTE — Assessment & Plan Note (Signed)
Continue to follow-up with nephrology.  ?

## 2022-05-04 ENCOUNTER — Ambulatory Visit
Admission: RE | Admit: 2022-05-04 | Discharge: 2022-05-04 | Disposition: A | Discharge status: Home / Self Care | Payer: Medicare HMO | Source: Ambulatory Visit | Attending: Adult Health | Admitting: Adult Health

## 2022-05-04 DIAGNOSIS — R0689 Other abnormalities of breathing: Secondary | ICD-10-CM | POA: Insufficient documentation

## 2022-05-06 NOTE — Progress Notes (Signed)
Noted findings.

## 2022-05-11 ENCOUNTER — Encounter: Payer: Self-pay | Admitting: Dermatology

## 2022-05-19 ENCOUNTER — Telehealth: Payer: Self-pay | Admitting: Pulmonary Disease

## 2022-05-19 NOTE — Telephone Encounter (Signed)
Melvenia Needles, NP  04/30/2022  5:32 PM EDT     Labs are totally normal  Continue with office visit  Please contact office for sooner follow up if symptoms do not improve or worsen or seek emergency care     Patient is aware of results and voiced his understanding.  Nothing further needed.

## 2022-05-21 ENCOUNTER — Ambulatory Visit (INDEPENDENT_AMBULATORY_CARE_PROVIDER_SITE_OTHER): Payer: Medicare HMO | Admitting: Pharmacist

## 2022-05-21 DIAGNOSIS — Z794 Long term (current) use of insulin: Secondary | ICD-10-CM

## 2022-05-21 DIAGNOSIS — I129 Hypertensive chronic kidney disease with stage 1 through stage 4 chronic kidney disease, or unspecified chronic kidney disease: Secondary | ICD-10-CM

## 2022-05-21 DIAGNOSIS — J4540 Moderate persistent asthma, uncomplicated: Secondary | ICD-10-CM

## 2022-05-21 NOTE — Chronic Care Management (AMB) (Signed)
Chronic Care Management CCM Pharmacy Note  05/21/2022 Name:  Stephen White MRN:  [id number] DOB:  1936-08-15   Subjective: Stephen White is an 86 y.o. year old male who is a primary patient of Olin Hauser, DO.  The CCM team was consulted for assistance with disease management and care coordination needs.    Engaged with patient by telephone for follow up visit for pharmacy case management and/or care coordination services.   Objective:  Medications Reviewed Today     Reviewed by Rennis Petty, RPH-CPP (Pharmacist) on 05/21/22 at 1128  Med List Status: <None>   Medication Order Taking? Sig Documenting Provider Last Dose Status Informant  aspirin 81 MG EC tablet [id number] No Take 81 mg by mouth daily [provider] Taking Active   Carboxymethylcellulose Sodium (EYE DROPS OP) [id number] No Apply to eye. [provider] Taking Active   Continuous Blood Gluc Receiver (FREESTYLE LIBRE 2 READER) DEVI [id number] No by Does not apply route. [provider] Taking Active   Continuous Blood Gluc Sensor (FREESTYLE LIBRE [place]) [place] [id number] No 1 kit. [provider] Taking Active   Emollient Nigel Sloop [place]) LOTN [id number] No Apply topically. [provider] Taking Active   Emollient (GOLD BOND ULTIMATE) Sharlyne Pacas [id number] No Apply topically at bedtime. Diabetic lotion [provider] Taking Active   famotidine (PEPCID) 10 MG tablet [id number] No Take 1 tablet (10 mg total) by mouth daily. Olin Hauser, DO Taking Active   fluorouracil (EFUDEX) 5 % cream [id number] No Apply twice a day for 7 days to affected areas including forehead, temples, right nose. Ralene Bathe, MD Taking Active   gabapentin (NEURONTIN) 300 MG capsule [id number] No TAKE 1 CAPSULE BY MOUTH EVERY NIGHT AS NEEDED FOR FOOT PAIN [provider] Taking Active   hydrochlorothiazide (HYDRODIURIL) 25 MG tablet [id number] No TAKE 1  TABLET(25 MG) BY MOUTH DAILY [place], Devonne Doughty, DO Taking Active   insulin aspart (NOVOLOG) 100 UNIT/ML FlexPen [id number] No Take NovoLog insulin 10-15 minutes BEFORE you eat.  Take NovoLog 40 units before breakfast Take NovoLog 15 units before lunch Take NovoLog 40 units before supper.  Adjust for CBG <70 following direction from Endocrinologist [provider] Taking Active   insulin detemir (LEVEMIR) 100 UNIT/ML FlexPen [id number] No Inject into the skin. Inject 15 Units subcutaneously nightly [provider] Taking Active   levalbuterol (XOPENEX HFA) 45 MCG/ACT inhaler [id number]  Inhale 1 puff into the lungs every 6 (six) hours as needed for wheezing. Parrett, Fonnie Mu, NP  Active   losartan (COZAAR) 100 MG tablet [id number] No TAKE 1 TABLET(100 MG) BY MOUTH DAILY Karamalegos, Devonne Doughty, DO Taking Active   montelukast (SINGULAIR) 10 MG tablet [id number] No Take 1 tablet (10 mg total) by mouth at bedtime. Olin Hauser, DO Taking Active   MULTIPLE VITAMIN PO [id number] No Take by mouth daily.  [provider] Taking Active            Med Note Emmaline Kluver May 05, 2015 12:22 PM) Received from: [employer]  Multiple Vitamins-Minerals [facility] COMPLETE SENIOR MULTIVITS) TABS [id number] No Take by mouth. [provider] Taking Active   ONE TOUCH ULTRA TEST test strip [id number] No 1 each by Other route as needed.  [provider] Taking Active            Med Note Doristine Devoid   Fri Oct 03, 2015  9:40 AM) Received  from: External Pharmacy  pravastatin (PRAVACHOL) 10 MG tablet [id number] No TAKE 1 TABLET BY MOUTH EVERY DAY Olin Hauser, DO Taking Active   SALINE MIST SPRAY NA [id number] No Place into the nose. [provider] Taking Active   vitamin B-12 (CYANOCOBALAMIN) 500 MCG tablet [id number] No Take 500 mcg by mouth daily. [provider] Taking Active              Pertinent Labs:  Lab Results  Component Value Date   HGBA1C 8.1 12/31/2020   Lab Results  Component Value Date   CHOL 152 08/01/2020   HDL 56 08/01/2020   LDLCALC 82 08/01/2020   TRIG 68 08/01/2020   CHOLHDL 2.7 08/01/2020   Lab Results  Component Value Date   CREATININE 2.31 (H) 03/09/2021   BUN 49 (H) 03/09/2021   NA 137 03/09/2021   K 4.1 03/09/2021   CL 103 03/09/2021   CO2 26 03/09/2021    SDOH:  (Social Determinants of Health) assessments and interventions performed:    [place]  Review of patient past medical history, allergies, medications, health status, including review of consultants reports, laboratory and other test data, was performed as part of comprehensive evaluation and provision of chronic care management services.   Care Plan : PharmD - Medication Managment and Assistance  Updates made by Rennis Petty, RPH-CPP since 05/21/2022 12:00 AM     Problem: Disease Progression      Long-Range Goal: Disease Progression Prevented or Minimized   Start Date: 02/02/2021  Expected End Date: 05/03/2021  Recent Progress: On track  Priority: High  Note:   Current Barriers:  Financial Barriers in complicated patient with multiple medical conditions including T2DM, CKD, GERD, HTN, HLD; patient has [employer] and reports copay for [place] is cost prohibitive at this time Approved to receive Novolog and Levemir from [employer] patient assistance program through 12/19/2022 Fear of hypoglycemia  Pharmacist Clinical Goal(s):  Over the next 90 days, patient will achieve adherence to monitoring guidelines and medication adherence to achieve therapeutic efficacy through collaboration with PharmD and provider.   Interventions: 1:1 collaboration with Olin Hauser, DO regarding development and update of comprehensive plan of care as evidenced by provider attestation and co-signature Inter-disciplinary care team  collaboration (see longitudinal plan of care) Perform chart review.  Office Visit with [place] on 5/8 Office Visit with [place] on 5/12. Provider advised patient: Set up MRI brain Change Albuterol to Xopenex 1 puff every 6hr as needed for dyspnea.  Set up Overnight oximetry test on room air.  Follow up with Dr. Patsey Berthold or Parrett in 4 weeks Note MRI completed on 5/16 Today reports over past few days his breathing and strength have been better Patient reports is going to cancel upcoming appointment with current Cardiologist, Dr. Saralyn Pilar, as received notification from his insurance that provider is no longer in-network.  Reports Dr. Rockey Situ with [facility] is in network Offer to reach out to PCP regarding referral to [facility], but patient declines as states due to the number of medical visit he currently has scheduled, prefers to wait to discuss this with PCP during upcoming visit in July.  Shortness of breath: Patient followed by [facility] Pulmonary [place] Current treatment: albuterol inhaler as needed Reports does not yet have Xopenex inhaler. Reports was told by his pharmacy that they did not have this prescription for him Place call to [place] today for patient. Speak with  [facility] and find pharmacy ran into a therapeutic duplication - advise that per Pulmonology note this was a therapeutic change from albuterol to levalbuterol.  Levalbuterol Rx to be filled by pharmacy. GoodRx coupon applied for cost savings Follow up with pharmacy to provide update. Remind patient of plan from Pulmonology to change from albuterol to Xopenex (levalbuterol) inhaler - using 1 puff every 6 hours as needed for shortness of breath Counsel on administration technique for levalbuterol, including importance of priming inhaler prior to first use Reports he is waiting to hear back about overnight oximetry test  Type 2 Diabetes Patient  [facility] End[facility] for management Uncontrolled; current treatment: Levemir: 15 units nightly NovoLog: 40 units before breakfast 15 units before lunch  40 units before supper Patient verbalizes understanding that if blood glucose <70 before meal, take 1/2 scheduled dose for Novolog, as directed by Endocrinologist Again counsel patient on importance of taking insulin regimen as directed by Endocrinologist and calling Endocrinology as needed for concerns related to his blood sugar Have counseled on s/s of low blood sugar, prevention of lows and how to manage lows Patient using Freestyle Libre 2 monitor for low blood sugar alarm To administer Novolog doses within 10 minutes prior to meals Patient using FreeStyle Libre 2 continuous glucose monitor Orders sensor refills from [employer] Target goal range: 80-180 mg/dL Alarms set to go off for readings outside of range: 70-240 mg/dL Encourage patient to review nutrition labels to review carbohydrate and sodium contents of foods he purchases Counsel on low carbohydrate/low sodium snack options  Hypertension/CKD Current regimen: HCTZ 25 mg daily Losartan 100 mg daily Denies monitoring home blood pressure recently  Reports following sodium restriction and fluid limits from Nephrologist Have counseled patient to monitor home BP, keep log and have record to review during medical appointments, to call office if readings consistently >140/90 or new symptoms     Hyperlipidemia Current regimen: pravastatin 10 mg daily Have counseled on importance of reviewing nutrition labels for cholesterol content.   Patient Goals/Self-Care Activities Over the next 90 days, patient will:  - take medications as prescribed using weekly pillbox - monitor blood glucose as directed by Endocrinologist  Note patient using FreeStyle Libre 2 continuous glucose monitor - check blood pressure, document, and provide at future appointments -  attend medical appointments as scheduled  Follow Up Plan: Telephone follow up appointment with care management team member scheduled for: 07/28/2022 at 1:00 PM       Wallace Cullens, PharmD, [place], [place] [phone]

## 2022-05-21 NOTE — Patient Instructions (Signed)
Visit Information  Thank you for taking time to visit with me today. Please don't hesitate to contact me if I can be of assistance to you before our next scheduled telephone appointment.  Following are the goals we discussed today:   Goals Addressed             This Visit's Progress    Pharmacy Goals       Our goal A1c is less than 7%. This corresponds with fasting sugars less than 130 and 2 hour after meal sugars less than 180. Please continue to monitor home blood sugar.  Our goal bad cholesterol, or LDL, is less than 70. This is why it is important to continue taking your pravastatin.  Please check your home blood pressure, keep a log of the results and have this record for [place] to review during our next telephone appointment  Feel free to call me with any questions or concerns. I look forward to our next call!  Wallace Cullens, PharmD, Para [place], CPP Clinical Pharmacist [facility] [phone]         Our next appointment is by telephone on 07/28/2022 at 1:00 PM  Please call the care guide team at [phone] if you need to cancel or reschedule your appointment.    Patient verbalizes understanding of instructions and care plan provided today and agrees to view in [place]. Active MyChart status and patient understanding of how to access instructions and care plan via MyChart confirmed with patient.

## 2022-05-26 NOTE — Progress Notes (Signed)
Agree with the details of the visit as noted by Tammy Parrett, NP.  C. Laura Cortana Vander[place], MD  PCCM 

## 2022-05-27 ENCOUNTER — Telehealth: Payer: Self-pay | Admitting: Adult Health

## 2022-05-27 NOTE — Telephone Encounter (Signed)
Received voicemail from patient. He stated that he received a bill for ONO.  Spoke to patient and recommended that he contact [employer] billing department.  He requested that our office call.  Rodena Piety, can you help with this?

## 2022-05-27 NOTE — Telephone Encounter (Signed)
I left message for Melissa with [employer] to call me back about this issue

## 2022-05-28 NOTE — Telephone Encounter (Signed)
Stephen White from [place] called back today and stated that the ONO has been done and she was going to send to the office. Stephen White got a bill from [employer] and they should have billed the insurance and not the patient. [employer] should have called the patient about this and not to worry about the bill

## 2022-05-28 NOTE — Telephone Encounter (Signed)
I spoke with Melissa from [employer] and she stated that the only thing they show for the patient is the order for ONO and it hasn't been done. She stated she would reach out to the patient

## 2022-05-28 NOTE — Telephone Encounter (Signed)
Results have been received and faxed to Gonzales at [facility] office for review.

## 2022-06-02 NOTE — Telephone Encounter (Signed)
Patient is aware of results and voiced his understanding.  Nothing further needed.  

## 2022-06-02 NOTE — Telephone Encounter (Signed)
Overnight oximetry test done on May 07, 2022 shows minimum desaturations while sleeping.  4 minutes 29 seconds was spent less than 88% with an average O2 saturation at 92%.  Patient only spent time consecutive less than 88% at 48 seconds.  This is very minimal and do not feel that he would need oxygen at this time.  Continue with office visit recommendations and follow-up

## 2022-06-02 NOTE — Telephone Encounter (Signed)
Tammy, have you received these results?

## 2022-06-08 ENCOUNTER — Ambulatory Visit: Payer: Medicare HMO | Admitting: Pulmonary Disease

## 2022-06-08 ENCOUNTER — Encounter: Payer: Self-pay | Admitting: Pulmonary Disease

## 2022-06-08 VITALS — BP 118/80 | HR 66 | Temp 97.9°F | Ht 66.0 in | Wt 190.4 lb

## 2022-06-08 DIAGNOSIS — Z794 Long term (current) use of insulin: Secondary | ICD-10-CM

## 2022-06-08 DIAGNOSIS — J4520 Mild intermittent asthma, uncomplicated: Secondary | ICD-10-CM

## 2022-06-08 DIAGNOSIS — R0602 Shortness of breath: Secondary | ICD-10-CM

## 2022-06-08 DIAGNOSIS — F458 Other somatoform disorders: Secondary | ICD-10-CM

## 2022-06-08 DIAGNOSIS — N184 Chronic kidney disease, stage 4 (severe): Secondary | ICD-10-CM | POA: Diagnosis not present

## 2022-06-08 DIAGNOSIS — E1122 Type 2 diabetes mellitus with diabetic chronic kidney disease: Secondary | ICD-10-CM

## 2022-06-08 NOTE — Patient Instructions (Signed)
Stop taking the Pravachol or pravastatin.  This is the cholesterol medication until at least your kidney doctor can make sure that you are kidney function can tolerate it.  Stop taking the Singulair or montelukast (I think you have already stopped it) this was the medicine you are taking at bedtime.  As your kidney doctor about the Cozaar medication (losartan) particularly in view of your kidney function.  We will see you in follow-up in 4 to 6 weeks time call sooner should any new problems arise.  At this point I do not think your breathing issues are due to any problems with your lungs.  I am concerned about your kidney function and metabolic imbalance in your blood that can cause issues with breathing fast.  When you get into an episode where your breathing fast try to breathe out like if you were breathing over pursed lips to see if that will slow down your breathing some.  This will make you less anxious and allow you to breathe a little bit easier.

## 2022-06-08 NOTE — Progress Notes (Signed)
Subjective:    Patient ID: Stephen White, male    DOB: [birth year], 86 y.o.   MRN: [id number] Patient Care Team: Smitty Cords, DO as PCP - General (Family Medicine) [place] Crane, MD as Consulting Physician (Ophthalmology) Tedd Sias Marlana Salvage, MD as Physician Assistant (Endocrinology) Deirdre Evener, MD as Consulting Physician (Dermatology) Ronney Asters, Jackelyn Poling, RPH-CPP as Pharmacist  Chief Complaint  Patient presents with   Follow-up    SOB with exertion and at rest.     HPI 86 year old male former minimal smoker seen for pulmonary consult for evaluation of shortness of breath Medical history significant for diabetes chronic kidney diseaseStage IV, peripheral vascular disease, autonomic dysfunction and coronary artery disease, CHF.  Patient presents for follow-up today.  Last seen by Rubye Oaks, NP on 30 Apr 2022 for the details of that visit please refer to that note.  Continues to complain of dyspnea and fatigue.  Noted significant anxiety overlay.The patient states that he feels fatigued more than anything just feels like he "cannot go". Cough is dry at times. No hemoptysis. As noted no sputum production. The patient notes that when he stands up his whole body "shakes" he also notices that he breathes hard when he exerts himself. He has noted also some fluid retention particularly in the lower extremities. He has a history of kidney disease and feels that his abdomen is developing "belly fat". He does not describe any orthopnea or paroxysmal nocturnal dyspnea. He is actually as sleep he has no difficulties. Has chronic nasal congestion and sometimes this causes him difficulty breathing through his nose. No chest pain per se. No tachypalpitations.   TEST/EVENTS :  Echo 02/2022 nml EF , Nml RVSF , no valvular stenosis (care everywhere)  Chest x-ray clear VQ scan normal ABG normal PO2, pH 7.46, PCO2 29, O2 saturation 98%  CBC with no anemia, eosinophil absolute count  elevated at 832 PFT showed very mild asthma versus reactive airways with no real airflow obstruction or restriction.  There was mild reversibility and small airway reversibility, FEV1 at 95%, ratio 76, FVC 87%, 10% bronchodilator change, DLCO 84%. Overnight oximetry May 07, 2022 shows minimum desaturations while sleeping.  4 minutes 29 seconds was spent less than 88% with average O2 saturation at 92%.  No oxygen was started  MRI brain negative for acute process.  Chronic microvascular ischemic disease and cerebral atrophy.  Similar chronic arachnoid cyst in the posterior fossa   Review of Systems A 10 point review of systems was performed and it is as noted above otherwise negative.  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 without macular edema, with long-term current use of insulin (HCC) 12/11/2019   Background diabetic retinopathy associated with type 2 diabetes mellitus (HCC) 09/27/2019   Type 2 diabetes mellitus with diabetic nephropathy, with long-term current use of insulin (HCC) 02/14/2019   Flatulence 11/03/2016   Multiple actinic keratoses 05/05/2015   Arthropathia 05/05/2015   CAFL (chronic airflow limitation) (HCC) 05/05/2015   Impotence  of organic origin 05/05/2015   Gastro-esophageal reflux disease without esophagitis 05/05/2015   Retinopathy 12/23/2014   Microalbuminuria 12/23/2014   CKD (chronic kidney disease), stage IV (HCC) 05/07/2010   Allergic  rhinitis 05/14/2008   Benign hypertension with CKD (chronic kidney disease) stage IV (HCC) 10/18/2007   HLD (hyperlipidemia) 09/06/2007   Social History   Tobacco Use   Smoking status: Former    Packs/day: 0.30    Years: 25.00    Additional pack years: 0.00    Total pack years: 7.50    Types: Cigarettes    Quit date: 12/19/1978    Years since quitting: 44.4   Smokeless tobacco: Former  Substance Use Topics   Alcohol use: Yes    Alcohol/week: 0.0 standard drinks of alcohol   No Known Allergies  Current Meds  Medication Sig   aspirin 81 MG EC tablet Take 81 mg by mouth daily   Carboxymethylcellulose Sodium (EYE DROPS OP) Apply to eye.   Continuous Blood Gluc Receiver (FREESTYLE LIBRE 2 READER) DEVI by Does not apply route.   Continuous Blood Gluc Sensor (FREESTYLE LIBRE 14 DAY SENSOR) MISC 1 kit.   famotidine (PEPCID) 10 MG tablet Take 1 tablet (10 mg total) by mouth daily. (Patient taking differently: Take 10 mg by mouth every other day. As needed)   gabapentin (NEURONTIN) 300 MG capsule Take 300 mg by mouth as needed.   insulin aspart (NOVOLOG) 100 UNIT/ML FlexPen Take NovoLog 32-35 units with meals following direction from Endocrinologist   levalbuterol (XOPENEX HFA) 45 MCG/ACT inhaler Inhale 1 puff into the lungs every 6 (six) hours as needed for wheezing.   Multiple Vitamins-Minerals (PX COMPLETE SENIOR MULTIVITS) TABS Take by mouth.   SALINE MIST SPRAY NA Place into the nose.   vitamin B-12 (CYANOCOBALAMIN) 500 MCG tablet Take 500 mcg by mouth daily.   hydrochlorothiazide (HYDRODIURIL) 25 MG tablet TAKE 1 TABLET(25 MG) BY MOUTH DAILY    insulin detemir (LEVEMIR) 100 UNIT/ML FlexPen Inject 18 Units into the skin daily. (Patient not taking: Reported on 04/11/2023)   losartan (COZAAR) 100 MG tablet TAKE 1 TABLET(100 MG) BY MOUTH DAILY   montelukast (SINGULAIR) 10 MG tablet Take 1 tablet (10 mg total) by mouth at bedtime.   ONE TOUCH ULTRA TEST test strip 1 each by Other route  as needed.    pravastatin (PRAVACHOL) 10 MG tablet TAKE 1 TABLET BY MOUTH EVERY DAY   Immunization History  Administered Date(s) Administered   Fluad Quad(high Dose 65+) 11/02/2019, 10/16/2020, 12/29/2021, 09/21/2022   Influenza, High Dose Seasonal PF 10/03/2015, 11/03/2016, 10/06/2017, 11/06/2018   PFIZER(Purple Top)SARS-COV-2 Vaccination 01/14/2020, 02/04/2020   Pneumococcal Conjugate-13 12/24/2014   Pneumococcal Polysaccharide-23 12/25/2012   Td 07/02/2015   Zoster, Live 12/25/2012       Objective:   Physical Exam BP 118/80 (BP Location: Left Arm, Cuff Size: Normal)   Pulse 66   Temp 97.9 F (36.6 C) (Temporal)   Ht 5\' 6"  (1.676 m)   Wt 190 lb 6.4 oz (86.4 kg)   SpO2 99%   BMI 30.73 kg/m   SpO2: 99 % O2 Device: None (Room air)  GENERAL: Overweight elderly gentleman, appears tachypneic and anxious.  Tremulous. HEAD: Normocephalic, atraumatic.  EYES: Pupils equal, round, reactive to light.  No scleral icterus.  MOUTH: Dentition intact, oral mucosa moist. NECK: Supple. No thyromegaly. Trachea midline. No JVD.  No adenopathy. PULMONARY: Good air entry bilaterally.  No adventitious sounds. CARDIOVASCULAR: S1 and S2. Regular rate and rhythm.  No rubs, murmurs  or gallops heard. ABDOMEN: Obese, no ascites, otherwise benign. MUSCULOSKELETAL: No joint deformity, no clubbing,+2 edema of the LEs.  NEUROLOGIC: Tremulous when standing up.  No overt focal deficit.  No asterixis. SKIN: Intact,warm,dry. PSYCH: Anxious, tearful at times.      Assessment & Plan:     ICD-10-CM   1. Mild intermittent asthma, unspecified whether complicated  J45.20    Continue as needed levo albuterol    2. Shortness of breath  R06.02    Out of proportion to relatively benign pulmonary workup Hyperventilation may be due to metabolic derangements    3. Hyperventilation syndrome  F45.8    Not clear on what is triggering May be due to metabolic derangements No pulmonary/apparent cardiac etiology  for same    4. Chronic kidney disease, stage 4 (severe) (HCC)  N18.4    This issue adds complexity to his management Query worsening Has upcoming nephrology appointment    5. Type 2 diabetes mellitus with stage 4 chronic kidney disease, with long-term current use of insulin (HCC)  E11.22    N18.4    Z79.4    This issue adds complexity to his management     The patient has had a relatively benign pulmonary evaluation.  I do not think that is current issues with dyspnea related to pulmonary issues.  I am concerned about his declining renal function and potential metabolic imbalance.  I have recommended that he discontinue Pravachol and Singulair for now and also discussed with renal with regards to the losartan.  The Singulair may be adding to his anxiety and is not helping any with his dyspnea.  The other 2 medications need reevaluation particularly in view of the patient's declining renal function.  Will see the patient in follow-up in 4 to 6 weeks time he is to call sooner should any new problems arise.  Gailen Shelter, MD Advanced Bronchoscopy PCCM [facility]-    *This note was dictated using voice recognition software/Dragon.  Despite best efforts to proofread, errors can occur which can change the meaning. Any transcriptional errors that result from this process are unintentional and may not be fully corrected at the time of dictation.

## 2022-06-18 DIAGNOSIS — Z794 Long term (current) use of insulin: Secondary | ICD-10-CM

## 2022-06-18 DIAGNOSIS — E1122 Type 2 diabetes mellitus with diabetic chronic kidney disease: Secondary | ICD-10-CM

## 2022-06-18 DIAGNOSIS — N184 Chronic kidney disease, stage 4 (severe): Secondary | ICD-10-CM | POA: Diagnosis not present

## 2022-06-18 DIAGNOSIS — I129 Hypertensive chronic kidney disease with stage 1 through stage 4 chronic kidney disease, or unspecified chronic kidney disease: Secondary | ICD-10-CM | POA: Diagnosis not present

## 2022-06-18 DIAGNOSIS — E785 Hyperlipidemia, unspecified: Secondary | ICD-10-CM

## 2022-07-07 ENCOUNTER — Encounter: Payer: Self-pay | Admitting: Family Medicine

## 2022-07-07 ENCOUNTER — Ambulatory Visit (INDEPENDENT_AMBULATORY_CARE_PROVIDER_SITE_OTHER): Payer: Medicare HMO | Admitting: Family Medicine

## 2022-07-07 VITALS — BP 140/56 | HR 70 | Ht 66.0 in | Wt 183.0 lb

## 2022-07-07 DIAGNOSIS — Z0000 Encounter for general adult medical examination without abnormal findings: Secondary | ICD-10-CM

## 2022-07-07 DIAGNOSIS — J449 Chronic obstructive pulmonary disease, unspecified: Secondary | ICD-10-CM

## 2022-07-07 DIAGNOSIS — E1169 Type 2 diabetes mellitus with other specified complication: Secondary | ICD-10-CM | POA: Diagnosis not present

## 2022-07-07 DIAGNOSIS — E785 Hyperlipidemia, unspecified: Secondary | ICD-10-CM

## 2022-07-07 DIAGNOSIS — E1121 Type 2 diabetes mellitus with diabetic nephropathy: Secondary | ICD-10-CM

## 2022-07-07 DIAGNOSIS — J4540 Moderate persistent asthma, uncomplicated: Secondary | ICD-10-CM

## 2022-07-07 DIAGNOSIS — I129 Hypertensive chronic kidney disease with stage 1 through stage 4 chronic kidney disease, or unspecified chronic kidney disease: Secondary | ICD-10-CM

## 2022-07-07 DIAGNOSIS — Z794 Long term (current) use of insulin: Secondary | ICD-10-CM

## 2022-07-07 DIAGNOSIS — N184 Chronic kidney disease, stage 4 (severe): Secondary | ICD-10-CM

## 2022-07-07 NOTE — Patient Instructions (Addendum)
Thank you for coming to the office today.  Keep up with the Nephrologist, hopefully can help [place] identify an answer and treatment plan.  If no further ideas or testing from your specialist, we can try the Gallbladder scan evaluation, but I am not entirely convinced this is the cause.  I would be concerned with surgery / surgical risk for you.  Foot check done today.  Keep on current medications  Updated the list to remove extra meds as you mentioned.  Please schedule a Follow-up Appointment to: Return in about 4 months (around 11/07/2022) for 4 month follow-up DM, updates on breathing.  If you have any other questions or concerns, please feel free to call the office or send a message through [facility]. You may also schedule an earlier appointment if necessary.  Additionally, you may be receiving a survey about your experience at our office within a few days to 1 week by e-mail or mail. We value your feedback.  Nobie Putnam, DO [place]

## 2022-07-07 NOTE — Progress Notes (Signed)
Subjective:    Patient ID: Stephen White, male    DOB: [birth year], 86 y.o.   MRN: [id number]  Stephen White is a 86 y.o. male presenting on 07/07/2022 for Annual Exam   HPI  Here for Annual Physical and (labs to be drawn tomorrow at Nephrology)   Lifestyle / Overweight BMI >29 HYPERLIPIDEMIA: - Reports no concerns. Last lipid panel 07/2020, controlled  - Currently taking Pravastatin 1m, tolerating well without side effects or myalgias - Diet improved balanced - Regular activity  Has upcoming apt with Nephrology tomorrow 07/08/22   Hyperventilation / Dyspnea Followed by Pulmonology has had extensive testing, PFT, Imaging even recent work up for Neurogenic Hyperventilation and results of MR brain were negative, has had testing with Overnight Oximetry normal range not indicated for O2. Stephen White has family history of brother with gallbladder problem and they had gallbladder removed and their breathing improved. On Xopenex PRN some relief. But has worse tremors.  Balance problem / Dizziness Stephen White has already been reduced on BP medications Has seen Dr GRockey Situ thought unlikely cardiac etiology Does not have active dizziness or vertigo, more balance problem   Additional PMH - Type 2 DM uncontrolled, [facility]   HTN / CKD-IV Question if kidney function decline could cause his shortness of breath Last GFR 25-30, proteinuria Reduced dose of Losartan to 548m(from 10034m Fluid restriction. 2 glasses water, 1-2 cups coffee, sm V8 juice  Controlled BP recently  with some slight progression has been relatively stable >4+ years. Based on results Creatinine GFR        02/26/2022    2:09 PM 02/19/2022    3:33 PM 11/17/2020    1:37 PM  Depression screen PHQ 2/9  Decreased Interest 3 0 0  Down, Depressed, Hopeless 0 0 0  PHQ - 2 Score 3 0 0  Altered sleeping 2    Tired, decreased energy 2    Change in appetite 0    Feeling bad or failure about yourself  0     Trouble concentrating 0    Moving slowly or fidgety/restless 0    Suicidal thoughts 0    PHQ-9 Score 7    Difficult doing work/chores Not difficult at all      Past Medical History:  Diagnosis Date   Actinic keratosis    Allergy    Basal cell carcinoma 08/29/2018   L post inf ear/excision   Basal cell carcinoma 08/05/2020   L lat forehead above the brow - [facility]   Chronic kidney disease    GERD (gastroesophageal reflux disease)    Hyperlipidemia    Hypertension    Squamous cell carcinoma of skin 03/26/2015   L dorsal hand   Squamous cell carcinoma of skin 06/17/2015   R cheek   Squamous cell carcinoma of skin 05/10/2019   R hand dorsum/in situ   Squamous cell carcinoma of skin 08/05/2020   R of mideline forehead - [facility]   Thyroid disease    Type 2 diabetes mellitus [place]  Vertigo    Wears hearing aid in both ears    Past Surgical History:  Procedure Laterality Date   CATARACT EXTRACTION W/PHACO Left 04/03/2021   Procedure: CATARACT EXTRACTION PHACO AND INTRAOCULAR LENS PLACEMENT (IOC) LEFT DIABETIC 17.22 01:53.3 15.2%;  Surgeon: BraLeandrew KoyanagiD;  [facility];  Laterality: Left;  Diabetic - insulin   EYE SURGERY     Social History  Socioeconomic History   Marital status: Married    Spouse name: Not on file   Number of children: 1   Years of education: Not on file   Highest education level: 12th grade  Occupational History   Occupation: retired  Tobacco Use   Smoking status: Former    Packs/day: 0.30    Years: 25.00    Total pack years: 7.50    Types: Cigarettes    Quit date: 12/19/1978    Years since quitting: 43.5   Smokeless tobacco: Former  Scientific laboratory technician Use: Never used  Substance and Sexual Activity   Alcohol use: Yes    Alcohol/week: 0.0 standard drinks of alcohol   Drug use: No   Sexual activity: Not on file  Other Topics Concern   Not on file  Social History Narrative   Not on file    Social Determinants of Health   Financial Resource Strain: Low Risk  (02/19/2022)   Overall Financial Resource Strain (CARDIA)    Difficulty of Paying Living Expenses: Not hard at all  Food Insecurity: No Food Insecurity (02/19/2022)   Hunger Vital Sign    Worried About Running Out of Food in the Last Year: Never true    Ran Out of Food in the Last Year: Never true  Transportation Needs: No Transportation Needs (02/19/2022)   PRAPARE - Hydrologist (Medical): No    Lack of Transportation (Non-Medical): No  Physical Activity: Inactive (02/19/2022)   Exercise Vital Sign    Days of Exercise per Week: 0 days    Minutes of Exercise per Session: 0 min  Stress: No Stress Concern Present (02/19/2022)   [place]    Feeling of Stress : Not at all  Social Connections: Moderately Isolated (02/19/2022)   Social Connection and Isolation Panel [NHANES]    Frequency of Communication with Friends and Family: Once a week    Frequency of Social Gatherings with Friends and Family: Once a week    Attends Religious Services: More than 4 times per year    Active Member of [employer] or Organizations: No    Attends Archivist Meetings: Never    Marital Status: Married  Human resources officer Violence: Not At Risk (02/19/2022)   Humiliation, Afraid, Rape, and Kick questionnaire    Fear of Current or Ex-Partner: No    Emotionally Abused: No    Physically Abused: No    Sexually Abused: No   Family History  Problem Relation Age of Onset   Diabetes Mother    Diabetes Father    Stroke Father    Diabetes Brother    Diabetes Sister    Diabetes Sister    Diabetes Brother    Diabetes Brother    Diabetes Brother    Cancer Brother    Diabetes Brother    Current Outpatient Medications on File Prior to Visit  Medication Sig   aspirin 81 MG EC tablet Take 81 mg by mouth daily   Carboxymethylcellulose Sodium (EYE DROPS  OP) Apply to eye.   Continuous Blood Gluc Receiver (FREESTYLE LIBRE 2 READER) DEVI by Does not apply route.   Continuous Blood Gluc Sensor (FREESTYLE LIBRE 14 DAY SENSOR) MISC 1 kit.   Emollient (COCOA BUTTER) LOTN Apply topically.   Emollient (GOLD BOND ULTIMATE) LOTN Apply topically at bedtime. Diabetic lotion   famotidine (PEPCID) 10 MG tablet Take 1 tablet (10 mg total) by mouth daily.  fluorouracil (EFUDEX) 5 % cream Apply twice a day for 7 days to affected areas including forehead, temples, right nose.   gabapentin (NEURONTIN) 300 MG capsule TAKE 1 CAPSULE BY MOUTH EVERY NIGHT AS NEEDED FOR FOOT PAIN   hydrochlorothiazide (HYDRODIURIL) 25 MG tablet TAKE 1 TABLET(25 MG) BY MOUTH DAILY   insulin aspart (NOVOLOG) 100 UNIT/ML FlexPen Take NovoLog insulin 10-15 minutes BEFORE you eat.  Take NovoLog 40 units before breakfast Take NovoLog 15 units before lunch Take NovoLog 40 units before supper.  Adjust for CBG <70 following direction from Endocrinologist   insulin detemir (LEVEMIR) 100 UNIT/ML FlexPen Inject into the skin. Inject 15 Units subcutaneously nightly   levalbuterol (XOPENEX HFA) 45 MCG/ACT inhaler Inhale 1 puff into the lungs every 6 (six) hours as needed for wheezing.   MULTIPLE VITAMIN PO Take by mouth daily.    Multiple Vitamins-Minerals (PX COMPLETE SENIOR MULTIVITS) TABS Take by mouth.   ONE TOUCH ULTRA TEST test strip 1 each by Other route as needed.    SALINE MIST SPRAY NA Place into the nose.   vitamin B-12 (CYANOCOBALAMIN) 500 MCG tablet Take 500 mcg by mouth daily.   losartan (COZAAR) 100 MG tablet Take 0.5 tablets (50 mg total) by mouth daily.   No current facility-administered medications on file prior to visit.    Review of Systems  Constitutional:  Negative for activity change, appetite change, chills, diaphoresis, fatigue and fever.  HENT:  Negative for congestion and hearing loss.   Eyes:  Negative for visual disturbance.  Respiratory:  Positive for  shortness of breath. Negative for cough, chest tightness and wheezing.   Cardiovascular:  Negative for chest pain, palpitations and leg swelling.  Gastrointestinal:  Negative for abdominal pain, constipation, diarrhea, nausea and vomiting.  Genitourinary:  Negative for dysuria, frequency and hematuria.  Musculoskeletal:  Negative for arthralgias and neck pain.  Skin:  Negative for rash.  Neurological:  Negative for dizziness, weakness, light-headedness, numbness and headaches.  Hematological:  Negative for adenopathy.  Psychiatric/Behavioral:  Negative for behavioral problems, dysphoric mood and sleep disturbance.    Per HPI unless specifically indicated above      Objective:    BP (!) 140/56   Pulse 70   Ht 5' 6"  (1.676 m)   Wt 183 lb (83 kg)   SpO2 97%   BMI 29.54 kg/m   Wt Readings from Last 3 Encounters:  07/07/22 183 lb (83 kg)  06/08/22 190 lb 6.4 oz (86.4 kg)  04/30/22 192 lb 9.6 oz (87.4 kg)    Physical Exam Vitals and nursing note reviewed.  Constitutional:      General: Stephen White is not in acute distress.    Appearance: Stephen White is well-developed. Stephen White is not diaphoretic.     Comments: Well-appearing, comfortable, cooperative  HENT:     Head: Normocephalic and atraumatic.  Eyes:     General:        Right eye: No discharge.        Left eye: No discharge.     Conjunctiva/sclera: Conjunctivae normal.     Pupils: Pupils are equal, round, and reactive to light.  Neck:     Thyroid: No thyromegaly.  Cardiovascular:     Rate and Rhythm: Normal rate and regular rhythm.     Pulses: Normal pulses.     Heart sounds: Normal heart sounds. No murmur heard. Pulmonary:     Effort: No respiratory distress.     Breath sounds: Normal breath sounds. No wheezing or rales.  Comments: Hyperventilation and dyspnea. Impacts his talking, causes him to pause or stutter Abdominal:     General: Bowel sounds are normal. There is no distension.     Palpations: Abdomen is soft. There is no mass.      Tenderness: There is no abdominal tenderness.  Musculoskeletal:        General: No tenderness. Normal range of motion.     Cervical back: Normal range of motion and neck supple.     Comments: Upper / Lower Extremities: - Normal muscle tone, strength bilateral upper extremities 5/5, lower extremities 5/5  Lymphadenopathy:     Cervical: No cervical adenopathy.  Skin:    General: Skin is warm and dry.     Findings: No erythema or rash.  Neurological:     Mental Status: Stephen White is alert and oriented to person, place, and time.     Comments: Distal sensation intact to light touch all extremities  Psychiatric:        Mood and Affect: Mood normal.        Behavior: Behavior normal.        Thought Content: Thought content normal.     Comments: Well groomed, good eye contact, normal speech and thoughts     Diabetic Foot Exam - Simple   Simple Foot Form Diabetic Foot exam was performed with the following findings: Yes 07/07/2022  2:19 PM  Visual Inspection No deformities, no ulcerations, no other skin breakdown bilaterally: Yes Sensation Testing Intact to touch and monofilament testing bilaterally: Yes Pulse Check Posterior Tibialis and Dorsalis pulse intact bilaterally: Yes Comments No callus formation. No ulceration. Some varicose spider veins. No swelling. No deformity. Intact to monofilament.      Results for orders placed or performed in visit on 07/07/22  Hemoglobin A1c  Result Value Ref Range   Hemoglobin A1C 8.7       Assessment & Plan:   Problem List Items Addressed This Visit     Asthma   Benign hypertension with CKD (chronic kidney disease) stage IV (HCC)   Relevant Medications   losartan (COZAAR) 100 MG tablet   CAFL (chronic airflow limitation) (HCC)   CKD (chronic kidney disease), stage IV (HCC)   Type 2 diabetes mellitus with diabetic nephropathy, with long-term current use of insulin (HCC)   Relevant Medications   losartan (COZAAR) 100 MG tablet   Other  Visit Diagnoses     Annual physical exam    -  Primary   Hyperlipidemia associated with type 2 diabetes mellitus ([place])       Relevant Medications   losartan (COZAAR) 100 MG tablet       Updated Health Maintenance information Encouraged improvement to lifestyle with diet and exercise  Last lab abstracted, A1c >8.0%, it is above goal. Concern with med side effects on hypoglycemia risk will be cautious with further blood sugar management.  Hyperventilation unknown etiology Still concern with uncertain cause and we have reviewed his specialists work up lately with Pulm, Nephro, previously with Neuro Possibility of metabolic etiology with kidney disease, has upcoming apt this week w/ Nephrology.  Patient raised question of gallbladder, no prior imaging. Stephen White has no GI symptoms related. No RUQ pain, colicky post prandial issues, nausea vomiting. Seems highly unlikely, but could consider Abd [place] in future.  HTN CKD Agree with lower doses on his medications, as advised by previous specialist. Followed by Nephrology [facility] now.  No orders of the defined types were placed in this encounter.  Follow up plan: Return in about 4 months (around 11/07/2022) for 4 month follow-up DM, updates on breathing.  Nobie Putnam, DO [facility] 07/07/2022, 2:06 PM

## 2022-07-20 ENCOUNTER — Other Ambulatory Visit: Payer: Self-pay | Admitting: Family Medicine

## 2022-07-21 NOTE — Telephone Encounter (Signed)
Requested Prescriptions  Pending Prescriptions Disp Refills  . losartan (COZAAR) 100 MG tablet [Pharmacy Med Name: LOSARTAN '100MG'$  TABLETS] 90 tablet 1    Sig: TAKE 1 TABLET(100 MG) BY MOUTH DAILY     Cardiovascular:  Angiotensin Receptor Blockers Failed - 07/20/2022 12:37 PM      Failed - Cr in normal range and within 180 days    Creat  Date Value Ref Range Status  02/24/2021 2.69 (H) 0.70 - 1.11 mg/dL Final    Comment:    For patients >84 years of age, the reference limit for Creatinine is approximately 13% higher for people identified as African-American. .    Creatinine, Ser  Date Value Ref Range Status  03/09/2021 2.31 (H) 0.61 - 1.24 mg/dL Final         Failed - K in normal range and within 180 days    Potassium  Date Value Ref Range Status  03/09/2021 4.1 3.5 - 5.1 mmol/L Final         Failed - Last BP in normal range    BP Readings from Last 1 Encounters:  07/07/22 (!) 140/56         Passed - Patient is not pregnant      Passed - Valid encounter within last 6 months    Recent Outpatient Visits          2 weeks ago Annual physical exam   [place], DO   4 months ago Shortness of breath   [place], DO   5 months ago Shortness of breath   [place], DO   6 months ago Dyspnea on exertion   [place], DO   8 months ago Acute non-recurrent frontal sinusitis   [facility] [place], Devonne Doughty, DO      Future Appointments            In 1 week Ralene Bathe, MD [place]

## 2022-07-23 ENCOUNTER — Other Ambulatory Visit: Payer: Self-pay

## 2022-07-23 ENCOUNTER — Telehealth: Payer: Self-pay

## 2022-07-23 MED ORDER — LOSARTAN POTASSIUM 100 MG PO TABS: 50.0000 mg | ORAL_TABLET | Freq: Every day | ORAL | 1 refills | Status: DC

## 2022-07-23 NOTE — Telephone Encounter (Signed)
Pt.'s [facility] report they did not receive refill for Cozaar sent 07/07/22. Verbal refill given to pharmacist. # 90 with 1 refill.

## 2022-07-28 ENCOUNTER — Ambulatory Visit: Payer: Self-pay

## 2022-07-28 ENCOUNTER — Ambulatory Visit: Payer: Medicare HMO | Admitting: Pharmacist

## 2022-07-28 DIAGNOSIS — I129 Hypertensive chronic kidney disease with stage 1 through stage 4 chronic kidney disease, or unspecified chronic kidney disease: Secondary | ICD-10-CM

## 2022-07-28 DIAGNOSIS — Z794 Long term (current) use of insulin: Secondary | ICD-10-CM

## 2022-07-28 NOTE — Telephone Encounter (Signed)
Patient requesting a cb to discuss medication his kidney doctor prescribe   Patient wants Dr. Marthann Schiller recommendation on the medication.      Chief Complaint: Pt. States his "kidney doctor said he thinks I need and anxiety pill for my nerves. He told me to ask Dr. Raliegh Ip about and would he order something. He also said maybe I need to see a psychiatrist."  Symptoms: Feeling "nervous and I get SOB." Frequency:  Pertinent Negatives: Patient denies  Disposition: '[]'$ ED /'[]'$ Urgent Care (no appt availability in office) / '[]'$ Appointment(In office/virtual)/ '[]'$  [place] [facility]/ '[]'$ Home Care/ '[]'$ Refused Recommended Disposition /'[]'$ [place] [facility]/ '[x]'$  Follow-up with PCP Additional Notes: Please advise pt.   Answer Assessment - Initial Assessment Questions 1. NAME of MEDICINE: "What medicine(s) are you calling about?"     States "My kidney doctor thinks I need a pill for anxiety and told me to ask Dr. Parks Ranger about it." 2. QUESTION: "What is your question?" (e.g., double dose of medicine, side effect)     Anxiety medicine 3. PRESCRIBER: "Who prescribed the medicine?" Reason: if prescribed by specialist, call should be referred to that group.     N/a 4. SYMPTOMS: "Do you have any symptoms?" If Yes, ask: "What symptoms are you having?"  "How bad are the symptoms (e.g., mild, moderate, severe)     N/a 5. PREGNANCY:  "Is there any chance that you are pregnant?" "When was your last menstrual period?"     N/A  Protocols used: Medication Question Call-A-AH

## 2022-07-28 NOTE — Telephone Encounter (Signed)
Yes that is fine, we can work him in.  Looks like Friday at [employer] telephone would be a bit more open, Thursday end of morning seems quite busy, but either is okay.  Nobie Putnam, [facility] 07/28/2022, 5:26 PM

## 2022-07-28 NOTE — Patient Instructions (Signed)
Visit Information  Thank you for taking time to visit with me today. Please don't hesitate to contact me if I can be of assistance to you before our next scheduled telephone appointment.  Following are the goals we discussed today:   Goals Addressed             This Visit's Progress    Pharmacy Goals       Our goal A1c is less than 7%. This corresponds with fasting sugars less than 130 and 2 hour after meal sugars less than 180. Please continue to monitor home blood sugar.  Please check your home blood pressure, keep a log of the results and have this record for [place] to review during our next telephone appointment  Feel free to call me with any questions or concerns. I look forward to our next call!  Wallace Cullens, PharmD, [place], CPP Clinical Pharmacist [facility] [phone]         Our next appointment is by telephone on 09/29/2022 at 1:00 PM  Please call the care guide team at [phone] if you need to cancel or reschedule your appointment.    The patient verbalized understanding of instructions, educational materials, and care plan provided today and DECLINED offer to receive copy of patient instructions, educational materials, and care plan.

## 2022-07-28 NOTE — Telephone Encounter (Signed)
Please notify patient  I would recommend a Virtual Telephone visit for him so we can talk about it and review medication options.  He does not have to come in to office to talk about choosing the right medication.  Nobie Putnam, [facility] 07/28/2022, 4:20 PM

## 2022-07-28 NOTE — Chronic Care Management (AMB) (Signed)
Chronic Care Management CCM Pharmacy Note  07/28/2022 Name:  Stephen White MRN:  [id number] DOB:  1936/12/20   Subjective: Stephen White is an 86 y.o. year old male who is a primary patient of Olin Hauser, DO.  The CCM team was consulted for assistance with disease management and care coordination needs.    Engaged with patient by telephone for follow up visit for pharmacy case management and/or care coordination services.   Objective:  Medications Reviewed Today     Reviewed by Olin Hauser, DO (Physician) on 07/07/22 at 1406  Med List Status: <None>   Medication Order Taking? Sig Documenting Provider Last Dose Status Informant  aspirin 81 MG EC tablet [id number] Yes Take 81 mg by mouth daily [provider] Taking Active   Carboxymethylcellulose Sodium (EYE DROPS OP) [id number] Yes Apply to eye. [provider] Taking Active   Continuous Blood Gluc Receiver (FREESTYLE LIBRE 2 READER) DEVI [id number] Yes by Does not apply route. [provider] Taking Active   Continuous Blood Gluc Sensor (FREESTYLE LIBRE [place]) [place] [id number] Yes 1 kit. [provider] Taking Active   Emollient ([place]) LOTN [id number] Yes Apply topically. [provider] Taking Active   Emollient (GOLD [place] [id number] Yes Apply topically at bedtime. Diabetic lotion [provider] Taking Active   famotidine (PEPCID) 10 MG tablet [id number] Yes Take 1 tablet (10 mg total) by mouth daily. Olin Hauser, DO Taking Active   fluorouracil (EFUDEX) 5 % cream [id number] Yes Apply twice a day for 7 days to affected areas including forehead, temples, right nose. Ralene Bathe, MD Taking Active   gabapentin (NEURONTIN) 300 MG capsule [id number] Yes TAKE 1 CAPSULE BY MOUTH EVERY NIGHT AS NEEDED FOR FOOT PAIN [provider] Taking Active   hydrochlorothiazide (HYDRODIURIL) 25 MG tablet [id number] Yes TAKE 1  TABLET(25 MG) BY MOUTH DAILY [place] Ranger, Devonne Doughty, DO Taking Active   insulin aspart (NOVOLOG) 100 UNIT/ML FlexPen [id number] Yes Take NovoLog insulin 10-15 minutes BEFORE you eat.  Take NovoLog 40 units before breakfast Take NovoLog 15 units before lunch Take NovoLog 40 units before supper.  Adjust for CBG <70 following direction from Endocrinologist [provider] Taking Active   insulin detemir (LEVEMIR) 100 UNIT/ML FlexPen [id number] Yes Inject into the skin. Inject 15 Units subcutaneously nightly [provider] Taking Active   levalbuterol (XOPENEX HFA) 45 MCG/ACT inhaler [id number] Yes Inhale 1 puff into the lungs every 6 (six) hours as needed for wheezing. Parrett, Fonnie Mu, NP Taking Active   losartan (COZAAR) 100 MG tablet [id number] Yes TAKE 1 TABLET(100 MG) BY MOUTH DAILY Karamalegos, Devonne Doughty, DO Taking Active   montelukast (SINGULAIR) 10 MG tablet [id number] Yes Take 1 tablet (10 mg total) by mouth at bedtime. Olin Hauser, DO Taking Active   MULTIPLE VITAMIN PO [id number] Yes Take by mouth daily.  [provider] Taking Active            Med Note Emmaline Kluver May 05, 2015 12:22 PM) Received from: [facility] [place] [id number] Yes Take by mouth. [provider] Taking Active   ONE TOUCH ULTRA TEST test strip [id number] Yes 1 each by Other route as needed.  [provider] Taking Active            Med Note Doristine Devoid   Fri Oct 03, 2015  9:40 AM) Received  from: External Pharmacy  pravastatin (PRAVACHOL) 10 MG tablet [id number] Yes TAKE 1 TABLET BY MOUTH EVERY DAY Olin Hauser, DO Taking Active   SALINE MIST SPRAY NA [id number] Yes Place into the nose. [provider] Taking Active   vitamin B-12 (CYANOCOBALAMIN) 500 MCG tablet [id number] Yes Take 500 mcg by mouth daily. [provider] Taking Active              Pertinent Labs:  Lab Results  Component Value Date   HGBA1C 8.7 04/13/2022  Per shared record from [place], A1C 8.3% on 07/19/2022  Lab Results  Component Value Date   CHOL 152 08/01/2020   HDL 56 08/01/2020   LDLCALC 82 08/01/2020   TRIG 68 08/01/2020   CHOLHDL 2.7 08/01/2020   Lab Results  Component Value Date   CREATININE 2.31 (H) 03/09/2021   BUN 49 (H) 03/09/2021   NA 137 03/09/2021   K 4.1 03/09/2021   CL 103 03/09/2021   CO2 26 03/09/2021   BP Readings from Last 3 Encounters:  07/07/22 (!) 140/56  06/08/22 118/80  04/30/22 122/60   Pulse Readings from Last 3 Encounters:  07/07/22 70  06/08/22 66  04/30/22 72     SDOH:  (Social Determinants of Health) assessments and interventions performed:    [place]  Review of patient past medical history, allergies, medications, health status, including review of consultants reports, laboratory and other test data, was performed as part of comprehensive evaluation and provision of chronic care management services.   Care Plan : PharmD - Medication Managment and Assistance  Updates made by Rennis Petty, RPH-CPP since 07/28/2022 12:00 AM     Problem: Disease Progression      Long-Range Goal: Disease Progression Prevented or Minimized   Start Date: 02/02/2021  Expected End Date: 05/03/2021  Recent Progress: On track  Priority: High  Note:   Current Barriers:  Financial Barriers in complicated patient with multiple medical conditions including T2DM, CKD, GERD, HTN, HLD; patient has [employer] and reports copay for [place] is cost prohibitive at this time Approved to receive Novolog and Levemir from [employer] patient assistance program through 12/19/2022 Fear of hypoglycemia  Pharmacist Clinical Goal(s):  Over the next 90 days, patient will achieve adherence to monitoring guidelines and medication adherence to achieve therapeutic efficacy  through collaboration with PharmD and provider.   Interventions: 1:1 collaboration with Olin Hauser, DO regarding development and update of comprehensive plan of care as evidenced by provider attestation and co-signature Inter-disciplinary care team collaboration (see longitudinal plan of care) Perform chart review.  Office Visit with [facility] Endocrinology on 7/11. Provider advised patient: Adjust Novolog as follows: 36 units before breakfast, and 40 units before supper Continue Levemir as prescribed Office Visit with PCP on 7/19 for annual exam Office Visit with [place] on 7/20.  Today reports he is interested in taking a "nerve pill". Reports his Nephrologist recommended patient discuss this with his PCP Advise patient to contact office to schedule an appointment with PCP to discuss Advise patient to check with providers/Nephrologist prior to starting any over the counter supplements  Type 2 Diabetes Patient followed by [facility] Endocrinology for management Uncontrolled; current treatment: Levemir: 15 units nightly NovoLog: 36 units before breakfast 40 units before supper Endocrinologist noted in encounter from 7/11, patient only eats two meals during the day (once around 10 am, and again after 4 pm) Again counsel patient on  importance of taking insulin regimen as directed by Endocrinologist and calling Endocrinology as needed for concerns related to his blood sugar Have counseled on s/s of low blood sugar, prevention of lows and how to manage lows Patient using Freestyle Libre 2 monitor for low blood sugar alarm To administer Novolog doses within 10 minutes prior to meals Patient using FreeStyle Libre 2 continuous glucose monitor Orders sensor refills from [employer] Target goal range: 80-180 mg/dL Alarms set to go off for readings outside of range: 70-240 mg/dL Denies recent hypoglycemia Encourage patient to review  nutrition labels to review carbohydrate and sodium contents of foods he purchases Have counseled on low carbohydrate/low sodium snack options  Hypertension/CKD Current regimen: HCTZ 25 mg daily Losartan 100 mg - 1/2 tablet (50 mg) daily Last checked home blood pressure yesterday. Recalls reading was "good", but did not write down reading Encourage patient to follow sodium restriction and fluid limits from Nephrologist Encourage patient to monitor home BP, keep log and have record to review during medical appointments, to call office if readings consistently >140/90 or new symptoms    Patient Goals/Self-Care Activities Over the next 90 days, patient will:  - take medications as prescribed using weekly pillbox - monitor blood glucose as directed by Endocrinologist  Note patient using FreeStyle Libre 2 continuous glucose monitor - check blood pressure, document, and provide at future appointments - attend medical appointments as scheduled       Plan: Telephone follow up appointment with care management team member scheduled for:  09/29/2022 at 1:00 PM  Wallace Cullens, PharmD, [place], [place] [phone]

## 2022-07-29 NOTE — Telephone Encounter (Signed)
Ok, he's aware about tomorrow.

## 2022-07-30 ENCOUNTER — Encounter: Payer: Self-pay | Admitting: Family Medicine

## 2022-07-30 ENCOUNTER — Ambulatory Visit (INDEPENDENT_AMBULATORY_CARE_PROVIDER_SITE_OTHER): Payer: Medicare HMO | Admitting: Family Medicine

## 2022-07-30 VITALS — Ht 66.0 in | Wt 183.0 lb

## 2022-07-30 DIAGNOSIS — F458 Other somatoform disorders: Secondary | ICD-10-CM

## 2022-07-30 DIAGNOSIS — F411 Generalized anxiety disorder: Secondary | ICD-10-CM | POA: Diagnosis not present

## 2022-07-30 MED ORDER — ALPRAZOLAM 0.5 MG PO TABS: 0.5000 mg | ORAL_TABLET | Freq: Two times a day (BID) | ORAL | 1 refills | Status: DC | PRN

## 2022-07-30 NOTE — Patient Instructions (Addendum)
  Ordered Alprazolam 0.'5mg'$  twice a day may take half tab for 0.'25mg'$  dose as needed for anxiety and hyperventilation.  Follow up if need we can refer to Psych in future if needed  Please schedule a Follow-up Appointment to: Return if symptoms worsen or fail to improve.  If you have any other questions or concerns, please feel free to call the office or send a message through [place]. You may also schedule an earlier appointment if necessary.  Additionally, you may be receiving a survey about your experience at our office within a few days to 1 week by e-mail or mail. We value your feedback.  Nobie Putnam, DO [place]

## 2022-07-30 NOTE — Progress Notes (Signed)
Virtual Visit via Telephone The purpose of this virtual visit is to provide medical care while limiting exposure to the novel coronavirus (COVID19) for both patient and office staff.  Consent was obtained for phone visit:  Yes.   Answered questions that patient had about telehealth interaction:  Yes.   I discussed the limitations, risks, security and privacy concerns of performing an evaluation and management service by telephone. I also discussed with the patient that there may be a patient responsible charge related to this service. The patient expressed understanding and agreed to proceed.  Patient Location: Home Provider Location: [facility] (Office)  Participants in virtual visit: - Patient: Stephen White "Stephen White" - CMA: Orinda Kenner, [place] - Provider: Dr Parks Ranger  ---------------------------------------------------------------------- Chief Complaint  Patient presents with   Anxiety    S: Reviewed CMA documentation. I have called patient and gathered additional HPI as follows:   Generalized Anxiety Disorder Hyperventilation / Dyspnea Followed by Pulmonology has had extensive testing, PFT, Imaging even recent work up for Neurogenic Hyperventilation and results of MR brain were negative, has had testing with Overnight Oximetry normal range not indicated for O2. On Xopenex PRN some relief. But has worse tremors.  Also followed by Nephrology recent evaluation, he has CKD-IV and they were not able to conclude that his breathing is caused by metabolic or renal etiology. They recommended anxiety therapy. He has tried Alprazolam small dose from his wife's medicine and it has helped him, he would like treatment for his anxiety now to help manage this. He is unsure if this is the exact cause but he says he did feel better.  Denies any depression  Denies any fevers, chills, sweats, body ache, cough, shortness of breath, sinus pain or pressure, headache, abdominal pain,  diarrhea      Past Medical History:  Diagnosis Date   Actinic keratosis    Allergy    Basal cell carcinoma 08/29/2018   L post inf ear/excision   Basal cell carcinoma 08/05/2020   L lat forehead above the brow - [facility]   Chronic kidney disease    GERD (gastroesophageal reflux disease)    Hyperlipidemia    Hypertension    Squamous cell carcinoma of skin 03/26/2015   L dorsal hand   Squamous cell carcinoma of skin 06/17/2015   R cheek   Squamous cell carcinoma of skin 05/10/2019   R hand dorsum/in situ   Squamous cell carcinoma of skin 08/05/2020   R of mideline forehead - [facility]   Thyroid disease    Type 2 diabetes mellitus ([place])    Vertigo    Wears hearing aid in both ears    Social History   Tobacco Use   Smoking status: Former    Packs/day: 0.30    Years: 25.00    Total pack years: 7.50    Types: Cigarettes    Quit date: 12/19/1978    Years since quitting: 43.6   Smokeless tobacco: Former  Scientific laboratory technician Use: Never used  Substance Use Topics   Alcohol use: Yes    Alcohol/week: 0.0 standard drinks of alcohol   Drug use: No    Current Outpatient Medications:    ALPRAZolam (XANAX) 0.5 MG tablet, Take 1 tablet (0.5 mg total) by mouth 2 (two) times daily as needed for anxiety. May start with half pill for 0.77m dose. May increase in future if needed., Disp: 30 tablet, Rfl: 1   aspirin 81 MG EC tablet, Take 81  mg by mouth daily, Disp: , Rfl:    Carboxymethylcellulose Sodium (EYE DROPS OP), Apply to eye., Disp: , Rfl:    Continuous Blood Gluc Receiver (FREESTYLE LIBRE 2 READER) [place], by Does not apply route., Disp: , Rfl:    Continuous Blood Gluc Sensor (FREESTYLE LIBRE 14 DAY SENSOR) MISC, 1 kit., Disp: , Rfl:    Emollient (COCOA BUTTER) LOTN, Apply topically., Disp: , Rfl:    Emollient (GOLD BOND ULTIMATE) LOTN, Apply topically at bedtime. Diabetic lotion, Disp: , Rfl:    famotidine (PEPCID) 10 MG tablet, Take 1 tablet (10 mg total) by mouth daily. (Patient  taking differently: Take 10 mg by mouth every other day. As needed), Disp: 90 tablet, Rfl: 0   fluorouracil (EFUDEX) 5 % cream, Apply twice a day for 7 days to affected areas including forehead, temples, right nose., Disp: 15 g, Rfl: 0   gabapentin (NEURONTIN) 300 MG capsule, TAKE 1 CAPSULE BY MOUTH EVERY NIGHT AS NEEDED FOR FOOT PAIN, Disp: , Rfl:    hydrochlorothiazide (HYDRODIURIL) 25 MG tablet, TAKE 1 TABLET(25 MG) BY MOUTH DAILY, Disp: 90 tablet, Rfl: 1   insulin aspart (NOVOLOG) 100 UNIT/ML FlexPen, Take NovoLog insulin 10-15 minutes BEFORE you eat.  Take NovoLog 36 units before breakfast Take NovoLog 40 units before supper.  Adjust for CBG <70 following direction from Endocrinologist, Disp: , Rfl:    insulin detemir (LEVEMIR) 100 UNIT/ML FlexPen, Inject into the skin. Inject 15 Units subcutaneously nightly, Disp: , Rfl:    levalbuterol (XOPENEX HFA) 45 MCG/ACT inhaler, Inhale 1 puff into the lungs every 6 (six) hours as needed for wheezing., Disp: 1 each, Rfl: 1   losartan (COZAAR) 100 MG tablet, Take 0.5 tablets (50 mg total) by mouth daily., Disp: 90 tablet, Rfl: 1   MULTIPLE VITAMIN PO, Take by mouth daily. , Disp: , Rfl:    Multiple Vitamins-Minerals (PX COMPLETE SENIOR MULTIVITS) TABS, Take by mouth., Disp: , Rfl:    ONE TOUCH ULTRA TEST test strip, 1 each by Other route as needed. , Disp: , Rfl:    SALINE MIST SPRAY NA, Place into the nose., Disp: , Rfl:    vitamin B-12 (CYANOCOBALAMIN) 500 MCG tablet, Take 500 mcg by mouth daily., Disp: , Rfl:      02/26/2022    2:09 PM 02/19/2022    3:33 PM 11/17/2020    1:37 PM  Depression screen PHQ 2/9  Decreased Interest 3 0 0  Down, Depressed, Hopeless 0 0 0  PHQ - 2 Score 3 0 0  Altered sleeping 2    Tired, decreased energy 2    Change in appetite 0    Feeling bad or failure about yourself  0    Trouble concentrating 0    Moving slowly or fidgety/restless 0    Suicidal thoughts 0    PHQ-9 Score 7    Difficult doing work/chores Not  difficult at all          No data to display          -------------------------------------------------------------------------- O: No physical exam performed due to remote telephone encounter.  Lab results reviewed.  No results found for this or any previous visit (from the past 2160 hour(s)).  -------------------------------------------------------------------------- A&P:  Problem List Items Addressed This Visit     Hyperventilation syndrome   Relevant Medications   ALPRAZolam (XANAX) 0.5 MG tablet   Other Visit Diagnoses     GAD (generalized anxiety disorder)    -  Primary  Relevant Medications   ALPRAZolam (XANAX) 0.5 MG tablet      Clinically hyperventilation/dyspnea w / suspected anxiety as underlying cause, given all of the other medical etiology ruled out by thorough work up, see previous notes for documentation. He has had pulmonology detailed work up, imaging testing Trial on therapy with inhalers and other options Renal work up with CKD but not clear cause of metabolic reason for hyperventilation  Will pursue anxiety management now with low dose BDZ Alprazolam 0.25 to 0.5 mg BID PRN dosing, rx ordered. He has tried from wife's med before she agrees that it has helped him regulate his breathing before. We discussed risks benefits dependence etc and he agrees to trial low dose  We offered future Psych mental health referral, but defer at this time, he can notify me if interested   Meds ordered this encounter  Medications   ALPRAZolam (XANAX) 0.5 MG tablet    Sig: Take 1 tablet (0.5 mg total) by mouth 2 (two) times daily as needed for anxiety. May start with half pill for 0.32m dose. May increase in future if needed.    Dispense:  30 tablet    Refill:  1    Follow-up: - Return as needed  Patient verbalizes understanding with the above medical recommendations including the limitation of remote medical advice.  Specific follow-up and call-back criteria  were given for patient to follow-up or seek medical care more urgently if needed.   - Time spent in direct consultation with patient on phone: 12 minutes   ANobie Putnam DAlbionGroup 07/30/2022, 4:36 PM

## 2022-08-02 ENCOUNTER — Encounter: Payer: Self-pay | Admitting: Dermatology

## 2022-08-02 ENCOUNTER — Ambulatory Visit: Payer: Medicare HMO | Admitting: Dermatology

## 2022-08-02 DIAGNOSIS — L570 Actinic keratosis: Secondary | ICD-10-CM | POA: Diagnosis not present

## 2022-08-02 DIAGNOSIS — L821 Other seborrheic keratosis: Secondary | ICD-10-CM

## 2022-08-02 DIAGNOSIS — L578 Other skin changes due to chronic exposure to nonionizing radiation: Secondary | ICD-10-CM | POA: Diagnosis not present

## 2022-08-02 NOTE — Progress Notes (Signed)
   Follow-Up Visit   Subjective  Stephen White is a 86 y.o. male who presents for the following: Actinic Keratosis (2 month follow up. Face, nose Tx with LN2 at last visit. S/P 5FU Calcipotriene Tx to forehead, temples and nose). The patient has spots, moles and lesions to be evaluated, some may be new or changing and the patient has concerns that these could be cancer.  The following portions of the chart were reviewed this encounter and updated as appropriate:  Tobacco  Allergies  Meds  Problems  Med Hx  Surg Hx  Fam Hx     Review of Systems: No other skin or systemic complaints except as noted in HPI or Assessment and Plan.  Objective  Well appearing patient in no apparent distress; mood and affect are within normal limits.  A focused examination was performed including head, including the scalp, face, neck, nose, ears, eyelids, and lips. Relevant physical exam findings are noted in the Assessment and Plan.  Scalp and face x17 (17) Erythematous thin papules/macules with gritty scale.  Lesion at right nasal ala has resolved.    Assessment & Plan   Actinic Damage - chronic, secondary to cumulative UV radiation exposure/sun exposure over time - diffuse scaly erythematous macules with underlying dyspigmentation - Recommend daily broad spectrum sunscreen SPF 30+ to sun-exposed areas, reapply every 2 hours as needed.  - Recommend staying in the shade or wearing long sleeves, sun glasses (UVA+UVB protection) and wide brim hats (4-inch brim around the entire circumference of the hat). - Call for new or changing lesions.  Seborrheic Keratoses - Stuck-on, waxy, tan-brown papules and/or plaques  - Benign-appearing - Discussed benign etiology and prognosis. - Observe - Call for any changes  AK (actinic keratosis) (17) Scalp and face x17  Actinic keratoses are precancerous spots that appear secondary to cumulative UV radiation exposure/sun exposure over time. They are chronic with  expected duration over 1 year. A portion of actinic keratoses will progress to squamous cell carcinoma of the skin. It is not possible to reliably predict which spots will progress to skin cancer and so treatment is recommended to prevent development of skin cancer.  Recommend daily broad spectrum sunscreen SPF 30+ to sun-exposed areas, reapply every 2 hours as needed.  Recommend staying in the shade or wearing long sleeves, sun glasses (UVA+UVB protection) and wide brim hats (4-inch brim around the entire circumference of the hat). Call for new or changing lesions.  Destruction of lesion - Scalp and face x17 Complexity: simple   Destruction method: cryotherapy   Informed consent: discussed and consent obtained   Timeout:  patient name, date of birth, surgical site, and procedure verified Lesion destroyed using liquid nitrogen: Yes   Region frozen until ice ball extended beyond lesion: Yes   Outcome: patient tolerated procedure well with no complications   Post-procedure details: wound care instructions given   Additional details:  Prior to procedure, discussed risks of blister formation, small wound, skin dyspigmentation, or rare scar following cryotherapy. Recommend Vaseline ointment to treated areas while healing.   Related Medications fluorouracil (EFUDEX) 5 % cream Apply twice a day for 7 days to affected areas including forehead, temples, right nose.   Return in about 6 months (around 02/02/2023) for AK Follow Up.  I, Emelia Salisbury, CMA, am acting as scribe for Sarina Ser, MD. Documentation: I have reviewed the above documentation for accuracy and completeness, and I agree with the above.  Sarina Ser, MD

## 2022-08-02 NOTE — Patient Instructions (Signed)
Cryotherapy Aftercare  Wash gently with soap and water everyday.   Apply Vaseline daily until healed.   Recommend daily broad spectrum sunscreen SPF 30+ to sun-exposed areas, reapply every 2 hours as needed. Call for new or changing lesions.  Staying in the shade or wearing long sleeves, sun glasses (UVA+UVB protection) and wide brim hats (4-inch brim around the entire circumference of the hat) are also recommended for sun protection.     Due to recent changes in healthcare laws, you may see results of your pathology and/or laboratory studies on MyChart before the doctors have had a chance to review them. We understand that in some cases there may be results that are confusing or concerning to you. Please understand that not all results are received at the same time and often the doctors may need to interpret multiple results in order to provide you with the best plan of care or course of treatment. Therefore, we ask that you please give us 2 business days to thoroughly review all your results before contacting the office for clarification. Should we see a critical lab result, you will be contacted sooner.   If You Need Anything After Your Visit  If you have any questions or concerns for your doctor, please call our main line at [phone] and press option 4 to reach your doctor's medical assistant. If no one answers, please leave a voicemail as directed and we will return your call as soon as possible. Messages left after 4 pm will be answered the following business day.   You may also send us a message via MyChart. We typically respond to MyChart messages within 1-2 business days.  For prescription refills, please ask your pharmacy to contact our office. Our fax number is [phone].  If you have an urgent issue when the clinic is closed that cannot wait until the next business day, you can page your doctor at the number below.    Please note that while we do our best to be available for  urgent issues outside of office hours, we are not available 24/7.   If you have an urgent issue and are unable to reach us, you may choose to seek medical care at your doctor's office, retail clinic, urgent care center, or emergency room.  If you have a medical emergency, please immediately call 911 or go to the emergency department.  Pager Numbers  - Dr. Kowalski: [phone]  - Dr. Moye: [phone]  - Dr. Stewart: [phone]  In the event of inclement weather, please call our main line at [phone] for an update on the status of any delays or closures.  Dermatology Medication Tips: Please keep the boxes that topical medications come in in order to help keep track of the instructions about where and how to use these. Pharmacies typically print the medication instructions only on the boxes and not directly on the medication tubes.   If your medication is too expensive, please contact our office at [phone] option 4 or send us a message through MyChart.   We are unable to tell what your co-pay for medications will be in advance as this is different depending on your insurance coverage. However, we may be able to find a substitute medication at lower cost or fill out paperwork to get insurance to cover a needed medication.   If a prior authorization is required to get your medication covered by your insurance company, please allow us 1-2 business days to complete this process.  Drug prices   often vary depending on where the prescription is filled and some pharmacies may offer cheaper prices.  The website www.goodrx.com contains coupons for medications through different pharmacies. The prices here do not account for what the cost may be with help from insurance (it may be cheaper with your insurance), but the website can give you the price if you did not use any insurance.  - You can print the associated coupon and take it with your prescription to the pharmacy.  - You may also  stop by our office during regular business hours and pick up a GoodRx coupon card.  - If you need your prescription sent electronically to a different pharmacy, notify our office through [facility] MyChart or by phone at [phone] option 4.     Si Usted Necesita Algo Despus de Su Visita  Tambin puede enviarnos un mensaje a travs de MyChart. Por lo general respondemos a los mensajes de MyChart en el transcurso de 1 a 2 das hbiles.  Para renovar recetas, por favor pida a su farmacia que se ponga en contacto con nuestra oficina. Nuestro nmero de fax es el [phone].  Si tiene un asunto urgente cuando la clnica est cerrada y que no puede esperar hasta el siguiente da hbil, puede llamar/localizar a su doctor(a) al nmero que aparece a continuacin.   Por favor, tenga en cuenta que aunque hacemos todo lo posible para estar disponibles para asuntos urgentes fuera del horario de oficina, no estamos disponibles las 24 horas del da, los 7 das de la semana.   Si tiene un problema urgente y no puede comunicarse con nosotros, puede optar por buscar atencin mdica  en el consultorio de su doctor(a), en una clnica privada, en un centro de atencin urgente o en una sala de emergencias.  Si tiene una emergencia mdica, por favor llame inmediatamente al 911 o vaya a la sala de emergencias.  Nmeros de bper  - Dr. Kowalski: [phone]  - Dra. Moye: [phone]  - Dra. Stewart: [phone]  En caso de inclemencias del tiempo, por favor llame a nuestra lnea principal al [phone] para una actualizacin sobre el estado de cualquier retraso o cierre.  Consejos para la medicacin en dermatologa: Por favor, guarde las cajas en las que vienen los medicamentos de uso tpico para ayudarle a seguir las instrucciones sobre dnde y cmo usarlos. Las farmacias generalmente imprimen las instrucciones del medicamento slo en las cajas y no directamente en los tubos del medicamento.   Si  su medicamento es muy caro, por favor, pngase en contacto con nuestra oficina llamando al [phone] y presione la opcin 4 o envenos un mensaje a travs de MyChart.   No podemos decirle cul ser su copago por los medicamentos por adelantado ya que esto es diferente dependiendo de la cobertura de su seguro. Sin embargo, es posible que podamos encontrar un medicamento sustituto a menor costo o llenar un formulario para que el seguro cubra el medicamento que se considera necesario.   Si se requiere una autorizacin previa para que su compaa de seguros cubra su medicamento, por favor permtanos de 1 a 2 das hbiles para completar este proceso.  Los precios de los medicamentos varan con frecuencia dependiendo del lugar de dnde se surte la receta y alguna farmacias pueden ofrecer precios ms baratos.  El sitio web www.goodrx.com tiene cupones para medicamentos de diferentes farmacias. Los precios aqu no tienen en cuenta lo que podra costar con la ayuda del seguro (puede ser ms barato con   su seguro), pero el sitio web puede darle el precio si no utiliz ningn seguro.  - Puede imprimir el cupn correspondiente y llevarlo con su receta a la farmacia.  - Tambin puede pasar por nuestra oficina durante el horario de atencin regular y recoger una tarjeta de cupones de GoodRx.  - Si necesita que su receta se enve electrnicamente a una farmacia diferente, informe a nuestra oficina a travs de MyChart de [place] o por telfono llamando al [phone] y presione la opcin 4.  

## 2022-08-09 ENCOUNTER — Encounter: Payer: Self-pay | Admitting: Dermatology

## 2022-08-16 ENCOUNTER — Encounter: Payer: Self-pay | Admitting: Adult Health

## 2022-08-16 ENCOUNTER — Ambulatory Visit: Payer: Medicare HMO | Admitting: Adult Health

## 2022-08-16 DIAGNOSIS — N184 Chronic kidney disease, stage 4 (severe): Secondary | ICD-10-CM

## 2022-08-16 DIAGNOSIS — R0602 Shortness of breath: Secondary | ICD-10-CM

## 2022-08-16 DIAGNOSIS — F458 Other somatoform disorders: Secondary | ICD-10-CM | POA: Diagnosis not present

## 2022-08-16 DIAGNOSIS — J4520 Mild intermittent asthma, uncomplicated: Secondary | ICD-10-CM | POA: Diagnosis not present

## 2022-08-16 NOTE — Assessment & Plan Note (Signed)
Suspect is multifactorial.  Work-up has been unrevealing.  Continue on Xopenex as needed for possible mild intermittent asthma.  Suspect he has a component of physical deconditioning.  Plan  Patient Instructions  Activity as tolerated.  Xopenex inhaler As needed   Continue follow with Primary MD.  Xanax As needed  for anxiety .  Follow up with Dr. Patsey Berthold in 1 year and As needed

## 2022-08-16 NOTE — Assessment & Plan Note (Signed)
Mild intermittent asthma versus reactive airways.-May use Xopenex inhaler as needed.  Plan  Patient Instructions  Activity as tolerated.  Xopenex inhaler As needed   Continue follow with Primary MD.  Xanax As needed  for anxiety .  Follow up with Dr. Patsey Berthold in 1 year and As needed

## 2022-08-16 NOTE — Assessment & Plan Note (Signed)
Most likely secondary to underlying anxiety.  Patient is following with primary care.  Xanax seems to be helping.  Stress reducers discussed.

## 2022-08-16 NOTE — Patient Instructions (Signed)
Activity as tolerated.  Xopenex inhaler As needed   Continue follow with Primary MD.  Xanax As needed  for anxiety .  Follow up with Dr. Patsey Berthold in 1 year and As needed

## 2022-08-16 NOTE — Assessment & Plan Note (Signed)
Continue follow-up with nephrology. 

## 2022-08-16 NOTE — Progress Notes (Signed)
_0  ID: Stephen White, male    DOB: 05-10-1936, 86 y.o.   MRN: [id number]  Chief Complaint  Patient presents with   Follow-up    Referring provider: Nobie Putnam *  HPI: 86 year old male former smoker with minimal smoking history seen for pulmonary consult March 26, 2022 for shortness of breath Medical history significant for diabetes, chronic kidney disease stage IV, peripheral vascular disease, autonomic dysfunction, coronary artery disease, congestive heart failure  TEST/EVENTS :  Echo 02/2022 nml EF , Nml RVSF , no valvular stenosis (care everywhere)   Chest x-ray clear  VQ scan normal  ABG normal PO2, pH 7.46, PCO2 29, O2 saturation 98%,  CBC with no anemia, eosinophil absolute count elevated at 832  PFT showed very mild asthma versus reactive airways with no real airflow obstruction or restriction.  There was mild reversibility and small airway reversibility, FEV1 at 95%, ratio 76, FVC 87%, 10% bronchodilator change, DLCO 84%.  Overnight oximetry May 07, 2022 shows minimum desaturations while sleeping.  4 minutes 29 seconds was spent less than 88% with average O2 saturation at 92%.  No oxygen was started   MRI brain negative for acute process.  Chronic microvascular ischemic disease and cerebral atrophy.  Similar chronic arachnoid cyst in the posterior fossa  08/16/2022 Follow up : Dyspnea, Hyperventilation Patient returns for a 47-monthfollow-up.  Patient was seen in April for pulmonary consult.  He has an extensive medical history with chronic kidney disease stage IV, diabetes, neuropathy, coronary disease and congestive heart failure.  Patient's been having episodes of severe shortness of breath over the last couple years.  Has had an extensive work-up as above.  Work-up has been unrevealing.  Felt the patient could have possible component of reactive airways versus mild intermittent asthma.  He was changed from albuterol to Xopenex due to side effects however  did not feel like this made any difference in his breathing.  VQ scan was normal.  Felt that he possibly could have a metabolic issue however was seen by nephrology and felt kidneys were not contributing to his symptoms.  MRI brain was negative without acute process. Patient was recently seen by primary care provider and started on alprazolam for possible anxiety component.  Patient says that he has noticed that taking a small dose of alprazolam has significantly helped his episodes of severe dyspnea.  Patient says when he feels an episode coming on he will take a half of a Xanax and symptoms seem to improve substantially.  Patient says he still gets winded and has decreased activity tolerance. Says he does occasionally take some Xopenex which seem to help and he can tolerate this.    No Known Allergies  Immunization History  Administered Date(s) Administered   Fluad Quad(high Dose 65+) 11/02/2019, 10/16/2020, 12/29/2021   Influenza, High Dose Seasonal PF 10/03/2015, 11/03/2016, 10/06/2017, 11/06/2018   PFIZER(Purple Top)SARS-COV-2 Vaccination 01/14/2020, 02/04/2020   Pneumococcal Conjugate-13 12/24/2014   Pneumococcal Polysaccharide-23 12/25/2012   Td 07/02/2015   Zoster, Live 12/25/2012    Past Medical History:  Diagnosis Date   Actinic keratosis    Allergy    Basal cell carcinoma 08/29/2018   L post inf ear/excision   Basal cell carcinoma 08/05/2020   L lat forehead above the brow - [facility]   Chronic kidney disease    GERD (gastroesophageal reflux disease)    Hyperlipidemia    Hypertension    Squamous cell carcinoma of skin 03/26/2015   L dorsal hand   Squamous  cell carcinoma of skin 06/17/2015   R cheek   Squamous cell carcinoma of skin 05/10/2019   R hand dorsum/in situ   Squamous cell carcinoma of skin 08/05/2020   R of mideline forehead - [facility]   Thyroid disease    Type 2 diabetes mellitus ([place])    Vertigo    Wears hearing aid in both ears     Tobacco History: Social  History   Tobacco Use  Smoking Status Former   Packs/day: 0.30   Years: 25.00   Total pack years: 7.50   Types: Cigarettes   Quit date: 12/19/1978   Years since quitting: 43.6  Smokeless Tobacco Former   Counseling given: Not Answered   Outpatient Medications Prior to Visit  Medication Sig Dispense Refill   ALPRAZolam (XANAX) 0.5 MG tablet Take 1 tablet (0.5 mg total) by mouth 2 (two) times daily as needed for anxiety. May start with half pill for 0.51m dose. May increase in future if needed. 30 tablet 1   aspirin 81 MG EC tablet Take 81 mg by mouth daily     Carboxymethylcellulose Sodium (EYE DROPS OP) Apply to eye.     Continuous Blood Gluc Receiver (FREESTYLE LIBRE 2 READER) DEVI by Does not apply route.     Continuous Blood Gluc Sensor (FREESTYLE LIBRE 14 DAY SENSOR) MISC 1 kit.     famotidine (PEPCID) 10 MG tablet Take 1 tablet (10 mg total) by mouth daily. (Patient taking differently: Take 10 mg by mouth every other day. As needed) 90 tablet 0   gabapentin (NEURONTIN) 300 MG capsule Take 300 mg by mouth as needed.     hydrochlorothiazide (HYDRODIURIL) 25 MG tablet TAKE 1 TABLET(25 MG) BY MOUTH DAILY 90 tablet 1   insulin aspart (NOVOLOG) 100 UNIT/ML FlexPen Take NovoLog insulin 10-15 minutes BEFORE you eat.  Take NovoLog 36 units before breakfast Take NovoLog 40 units before supper.  Adjust for CBG <70 following direction from Endocrinologist     insulin detemir (LEVEMIR) 100 UNIT/ML FlexPen Inject into the skin. Inject 15 Units subcutaneously nightly     levalbuterol (XOPENEX HFA) 45 MCG/ACT inhaler Inhale 1 puff into the lungs every 6 (six) hours as needed for wheezing. 1 each 1   losartan (COZAAR) 100 MG tablet Take 0.5 tablets (50 mg total) by mouth daily. 90 tablet 1   Multiple Vitamins-Minerals (PX COMPLETE SENIOR MULTIVITS) TABS Take by mouth.     ONE TOUCH ULTRA TEST test strip 1 each by Other route as needed.      SALINE MIST SPRAY NA Place into the nose.      vitamin B-12 (CYANOCOBALAMIN) 500 MCG tablet Take 500 mcg by mouth daily.     Emollient (COCOA BUTTER) LOTN Apply topically. (Patient not taking: Reported on 08/16/2022)     Emollient (GOLD BOND ULTIMATE) LOTN Apply topically at bedtime. Diabetic lotion (Patient not taking: Reported on 08/16/2022)     fluorouracil (EFUDEX) 5 % cream Apply twice a day for 7 days to affected areas including forehead, temples, right nose. (Patient not taking: Reported on 08/16/2022) 15 g 0   MULTIPLE VITAMIN PO Take by mouth daily.  (Patient not taking: Reported on 08/16/2022)     No facility-administered medications prior to visit.     Review of Systems:   Constitutional:   No  weight loss, night sweats,  Fevers, chills,  +fatigue, or  lassitude.  HEENT:   No headaches,  Difficulty swallowing,  Tooth/dental problems, or  Sore throat,  No sneezing, itching, ear ache, nasal congestion, post nasal drip,   CV:  No chest pain,  Orthopnea, PND, swelling in lower extremities, anasarca, dizziness, palpitations, syncope.   GI  No heartburn, indigestion, abdominal pain, nausea, vomiting, diarrhea, change in bowel habits, loss of appetite, bloody stools.   Resp:  No excess mucus, no productive cough,  No non-productive cough,  No coughing up of blood.  No change in color of mucus.  No wheezing.  No chest wall deformity  Skin: no rash or lesions.  GU: no dysuria, change in color of urine, no urgency or frequency.  No flank pain, no hematuria   MS:  No joint pain or swelling.  No decreased range of motion.  No back pain.    Physical Exam  BP 120/64 (BP Location: Left Arm, Patient Position: Sitting, Cuff Size: Large)   Pulse 68   Temp 97.9 F (36.6 C) (Oral)   Ht _0  (1.676 m)   Wt 184 lb (83.5 kg)   SpO2 96%   BMI 29.70 kg/m   GEN: A/Ox3; pleasant , NAD, well nourished    HEENT:  [place]/AT,  NOSE-clear, THROAT-clear, no lesions, no postnasal drip or exudate noted.   NECK:  Supple w/ fair ROM;  no JVD; normal carotid impulses w/o bruits; no thyromegaly or nodules palpated; no lymphadenopathy.    RESP  Clear  P & A; w/o, wheezes/ rales/ or rhonchi. no accessory muscle use, no dullness to percussion  CARD:  RRR, no m/r/g, no peripheral edema, pulses intact, no cyanosis or clubbing.  GI:   Soft & nt; nml bowel sounds; no organomegaly or masses detected.   Musco: Warm bil, no deformities or joint swelling noted.   Neuro: alert, no focal deficits noted.    Skin: Warm, no lesions or rashes    Lab Results:    BNP  ProBNP No results found for: "PROBNP"  Imaging: No results found.       Latest Ref Rng & Units 04/20/2022    4:06 PM  PFT Results  FVC-Pre L 2.60   FVC-Predicted Pre % 80   FVC-Post L 2.83   FVC-Predicted Post % 87   Pre FEV1/FVC % % 75   Post FEV1/FCV % % 76   FEV1-Pre L 1.95   FEV1-Predicted Pre % 86   FEV1-Post L 2.15   DLCO uncorrected ml/min/mmHg 17.95   DLCO UNC% % 84   DLVA Predicted % 98   TLC L 5.05   TLC % Predicted % 80   RV % Predicted % 93     No results found for: "NITRICOXIDE"      Assessment & Plan:   Asthma Mild intermittent asthma versus reactive airways.-May use Xopenex inhaler as needed.  Plan  Patient Instructions  Activity as tolerated.  Xopenex inhaler As needed   Continue follow with Primary MD.  Xanax As needed  for anxiety .  Follow up with Dr. Patsey Berthold in 1 year and As needed       Chronic kidney disease, stage 4 (severe) ([place]) Continue follow-up with nephrology  Hyperventilation syndrome Most likely secondary to underlying anxiety.  Patient is following with primary care.  Xanax seems to be helping.  Stress reducers discussed.  SOB (shortness of breath) on exertion Suspect is multifactorial.  Work-up has been unrevealing.  Continue on Xopenex as needed for possible mild intermittent asthma.  Suspect he has a component of physical deconditioning.  Plan  Patient Instructions  Activity as  tolerated.  Xopenex inhaler As needed   Continue follow with Primary MD.  Xanax As needed  for anxiety .  Follow up with Dr. Patsey Berthold in 1 year and As needed         Rexene Edison, NP 08/16/2022

## 2022-08-17 NOTE — Progress Notes (Signed)
Agree with the details of the visit as noted by Tammy Parrett, NP.  C. Laura Meeah Totino, MD [facility] 

## 2022-08-31 ENCOUNTER — Ambulatory Visit: Payer: Medicare HMO

## 2022-09-09 ENCOUNTER — Other Ambulatory Visit: Payer: Self-pay | Admitting: Family Medicine

## 2022-09-09 DIAGNOSIS — I129 Hypertensive chronic kidney disease with stage 1 through stage 4 chronic kidney disease, or unspecified chronic kidney disease: Secondary | ICD-10-CM

## 2022-09-09 NOTE — Telephone Encounter (Signed)
Requested medication (s) are due for refill today: yes  Requested medication (s) are on the active medication list: yes  Last refill:  03/26/22 #90/1  Future visit scheduled: no  Notes to clinic:  Unable to refill per protocol due to failed labs, no updated results.      Requested Prescriptions  Pending Prescriptions Disp Refills   hydrochlorothiazide (HYDRODIURIL) 25 MG tablet [Pharmacy Med Name: HYDROCHLOROTHIAZIDE '25MG'$  TABLETS] 90 tablet 1    Sig: TAKE 1 TABLET(25 MG) BY MOUTH DAILY     Cardiovascular: Diuretics - Thiazide Failed - 09/09/2022  4:00 PM      Failed - Cr in normal range and within 180 days    Creat  Date Value Ref Range Status  02/24/2021 2.69 (H) 0.70 - 1.11 mg/dL Final    Comment:    For patients >8 years of age, the reference limit for Creatinine is approximately 13% higher for people identified as African-American. .    Creatinine, Ser  Date Value Ref Range Status  03/09/2021 2.31 (H) 0.61 - 1.24 mg/dL Final         Failed - K in normal range and within 180 days    Potassium  Date Value Ref Range Status  03/09/2021 4.1 3.5 - 5.1 mmol/L Final         Failed - Na in normal range and within 180 days    Sodium  Date Value Ref Range Status  03/09/2021 137 135 - 145 mmol/L Final  11/07/2018 141 134 - 144 mmol/L Final         Passed - Last BP in normal range    BP Readings from Last 1 Encounters:  08/16/22 120/64         Passed - Valid encounter within last 6 months    Recent Outpatient Visits           1 month ago GAD (generalized anxiety disorder)   [place], DO   2 months ago Annual physical exam   [place], DO   6 months ago Shortness of breath   [place], DO   7 months ago Shortness of breath   [place], DO   8 months ago Dyspnea on exertion   [place], DO       Future Appointments             In 4 months Ralene Bathe, MD [place]

## 2022-09-21 ENCOUNTER — Ambulatory Visit (INDEPENDENT_AMBULATORY_CARE_PROVIDER_SITE_OTHER): Payer: Medicare HMO

## 2022-09-21 DIAGNOSIS — Z23 Encounter for immunization: Secondary | ICD-10-CM

## 2022-09-29 ENCOUNTER — Telehealth: Payer: Medicare HMO

## 2022-10-06 ENCOUNTER — Ambulatory Visit: Payer: Medicare HMO | Admitting: Pharmacist

## 2022-10-06 DIAGNOSIS — Z794 Long term (current) use of insulin: Secondary | ICD-10-CM

## 2022-10-06 DIAGNOSIS — I129 Hypertensive chronic kidney disease with stage 1 through stage 4 chronic kidney disease, or unspecified chronic kidney disease: Secondary | ICD-10-CM

## 2022-10-06 NOTE — Chronic Care Management (AMB) (Signed)
Pharmacy Note  10/06/2022 Name: Stephen White MRN: [id number] DOB: 06-04-1936  Subjective: Stephen White is a 86 y.o. year old male who is a primary care patient of Stephen Hauser, White. The Care Management team was consulted for assistance with care management and care coordination needs.    Engaged with patient by telephone for follow up visit in response to provider referral for pharmacy case management and/or care coordination services.   The patient was given information about Care Management services today including:  Care Management services includes personalized support from designated clinical staff supervised by the patient's primary care provider, including individualized plan of care and coordination with other care providers. 24/7 contact phone numbers for assistance for urgent and routine care needs. The patient may stop case management services at any time by phone call to the office staff.  Patient agreed to services and consent obtained.  Objective:  Lab Results  Component Value Date   CREATININE 2.31 (H) 03/09/2021   CREATININE 2.69 (H) 02/24/2021   CREATININE 2.62 (H) 09/08/2020    Lab Results  Component Value Date   HGBA1C 8.7 04/13/2022    BP Readings from Last 3 Encounters:  08/16/22 120/64  07/07/22 (!) 140/56  06/08/22 118/80   Pulse Readings from Last 3 Encounters:  08/16/22 68  07/07/22 70  06/08/22 66    Care Plan  No Known Allergies  Medications Reviewed Today     Reviewed by Stephen White, RPH-CPP (Pharmacist) on 10/06/22 at 1350  Med List Status: <None>   Medication Order Taking? Sig Documenting Provider Last Dose Status Informant  ALPRAZolam (XANAX) 0.5 MG tablet [id number] Yes Take 1 tablet (0.5 mg total) by mouth 2 (two) times daily as needed for anxiety. May start with half pill for 0.82m dose. May increase in future if needed. Stephen White Taking Active   aspirin 81 MG EC tablet [id number] Take  81 mg by mouth daily [provider]  Active   Carboxymethylcellulose Sodium (EYE DROPS OP) [id number] Apply to eye. [provider]  Active   Continuous Blood Gluc Receiver (FREESTYLE LIBRE 2 READER) DEVI [id number] by Does not apply route. [provider]  Active   Continuous Blood Gluc Sensor (FREESTYLE LIBRE [id number] 1 kit. [provider]  Active   famotidine (PEPCID) 10 MG tablet [id number] Take 1 tablet (10 mg total) by mouth daily.  Patient taking differently: Take 10 mg by mouth every other day. As needed   Stephen White  Active   gabapentin (NEURONTIN) 300 MG capsule [id number] Take 300 mg by mouth as needed. [provider]  Active   hydrochlorothiazide (HYDRODIURIL) 25 MG tablet [id number] TAKE 1 TABLET(25 MG) BY MOUTH DAILY Karamalegos, ADevonne Doughty White  Active   insulin aspart (NOVOLOG) 100 UNIT/ML FlexPen [id number] Take NovoLog insulin 10-15 minutes BEFORE you eat.  Take NovoLog 36 units before breakfast Take NovoLog 40 units before supper.  Adjust for CBG <70 following direction from Endocrinologist [provider]  Active   insulin detemir (LEVEMIR) 100 UNIT/ML FlexPen [id number] Inject into the skin. Inject 15 Units subcutaneously nightly [provider]  Active   levalbuterol (XOPENEX HFA) 45 MCG/ACT inhaler [id number] Inhale 1 puff into the lungs every 6 (six) hours as needed for wheezing. Parrett, TFonnie Mu NP  Active   losartan (COZAAR) 100 MG tablet [id number] Take 0.5 tablets (50 mg total)  by mouth daily. Stephen Hauser, White  Active   Multiple Vitamins-Minerals [facility] COMPLETE SENIOR MULTIVITS) TABS [id number]  Take by mouth. [provider]  Active   ONE TOUCH ULTRA TEST test strip [id number]  1 each by Other route as needed.  [provider]  Active            Med Note Doristine Devoid   Fri Oct 03, 2015  9:40 AM) Received from: [place] [id number]  Place into the nose. [provider]  Active   vitamin B-12 (CYANOCOBALAMIN) 500 MCG tablet [id number]  Take 500 mcg by mouth daily. [provider]  Active             Care Plan : PharmD - Medication Managment and Assistance  Updates made by Stephen White, RPH-CPP since 10/06/2022 12:00 AM     Problem: Disease Progression      Long-Range Goal: Disease Progression Prevented or Minimized   Start Date: 02/02/2021  Expected End Date: 05/03/2021  Recent Progress: On track  Priority: High  Note:   Current Barriers:  Financial Barriers in complicated patient with multiple medical conditions including T2DM, CKD, GERD, HTN, HLD; patient has [employer] and reports copay for [place] is cost prohibitive at this time Approved to receive Novolog and Levemir from [employer] patient assistance program through 12/19/2022 Fear of hypoglycemia  Pharmacist Clinical Goal(s):  Over the next 90 days, patient will achieve adherence to monitoring guidelines and medication adherence to achieve therapeutic efficacy through collaboration with PharmD and provider.   Interventions: 1:1 collaboration with Stephen Hauser, White regarding development and update of comprehensive plan of care as evidenced by provider attestation and co-signature Inter-disciplinary care team collaboration (see longitudinal plan of care) Perform chart review.  Office Visit with [name] on 9/21 Office Visit with [facility] on 8/28 Virtual Visit with PCP on 8/11. Provider advised patient: Ordered Alprazolam 0.10m twice a day may take half tab for 0.254mdose as needed for anxiety and hyperventilation Follow up if need we can refer to Psych in future  Today reports continues to have trouble with fast breathing.  Reports has used his levalbuterol inhaler and that it "might have helped some". Reports  planning to try taking 1/2 tablet (0.25 mg) of the alprazolam 0.5 mg tablets prescribed to him by his PCP. Reports taking alprazolam as directed by his PCP has helped him to calm down/improve his breathing when he has needed it Discuss using caution with use of alprazolam. Patient states will sit down or lay down each time after taking alprazolam to reduce risk of fall  Medication Assistance: Reports interested in re-enrolling in [employer] assistance program for [employer] Levemir for 2024 calendar year Will collaborate with [place] to request her aid to patient with re-enrollment application  Type 2 Diabetes [facility] Uncontrolled; current treatment: Levemir: 15 units nightly NovoLog: 36 units before breakfast 40 units before supper Endocrinologist noted in encounter from 7/11, patient only eats two meals during the day (once around 10 am, and again after 4 pm) Again counsel patient on importance of taking insulin regimen as directed by Endocrinologist and calling Endocrinology as needed for concerns related to his blood sugar Have counseled on s/s of low blood sugar, prevention of lows and how to manage lows Patient using Freestyle Libre 2 monitor for low blood  sugar alarm Patient using FreeStyle Libre 2 continuous glucose monitor Orders sensor refills from [employer] Target goal range: 80-180 mg/dL Alarms set to go off for readings outside of range: 70-240 mg/dL Have counseled on low carbohydrate/low sodium snack options  Hypertension/CKD Current regimen: HCTZ 25 mg daily Losartan 100 mg - 1/2 tablet (50 mg) daily Denies checking home blood pressure recently Encourage patient to follow sodium restriction and fluid limits from Nephrologist Encourage patient to monitor home BP, keep log and have record to review during medical appointments, to call office if readings consistently >140/90 or new  symptoms    Patient Goals/Self-Care Activities Over the next 90 days, patient will:  - take medications as prescribed using weekly pillbox - monitor blood glucose as directed by Endocrinologist  Note patient using FreeStyle Libre 2 continuous glucose monitor - check blood pressure, document, and provide at future appointments - attend medical appointments as scheduled      Plan: Telephone follow up appointment with care management team member scheduled for:  11/22/2022 at 1 pm  Wallace Cullens, PharmD, [place], [facility] [place] [phone]

## 2022-10-06 NOTE — Patient Instructions (Signed)
Goals Addressed             This Visit's Progress    Pharmacy Goals       Our goal A1c is less than 7%. This corresponds with fasting sugars less than 130 and 2 hour after meal sugars less than 180. Please continue to monitor home blood sugar.  Please check your home blood pressure, keep a log of the results and have this record for [place] to review during our next telephone appointment  Please watch the mail for an envelope from [employer] containing the patient assistance program application. Please complete this application and mail back to [place] along with a copy of your Medicare Part D prescription card and a copy of your proof of income document.  Feel free to call me with any questions or concerns. I look forward to our next call!   Wallace Cullens, PharmD, [place], CPP Clinical Pharmacist [facility] [phone]

## 2022-10-13 ENCOUNTER — Telehealth: Payer: Self-pay | Admitting: Pharmacy Technician

## 2022-10-13 DIAGNOSIS — Z596 Low income: Secondary | ICD-10-CM

## 2022-10-13 NOTE — Progress Notes (Addendum)
[facility])                                            [place] Team    10/13/2022  ZEBEDIAH BEEZLEY 09-05-1936 [id number]                                      Medication Assistance Referral-FOR 2024 RE ENROLLMENT  Referral From: [facility] Embedded RPh Dorthula Perfect   Medication/Company: Tyler Aas / [employer] Patient application portion:  Education officer, museum portion: Faxed  to [employer] verified via: Office website  Medication/Company: Contractor / [employer] Patient application portion:  Education officer, museum portion: Faxed  to [employer] verified via: [employer]. Cia Garretson, [place]  [phone]

## 2022-11-22 ENCOUNTER — Ambulatory Visit: Payer: Medicare HMO | Admitting: Pharmacist

## 2022-11-22 DIAGNOSIS — I129 Hypertensive chronic kidney disease with stage 1 through stage 4 chronic kidney disease, or unspecified chronic kidney disease: Secondary | ICD-10-CM

## 2022-11-22 DIAGNOSIS — Z794 Long term (current) use of insulin: Secondary | ICD-10-CM

## 2022-11-22 NOTE — Chronic Care Management (AMB) (Signed)
Pharmacy Note  11/22/2022 Name: EUAL LINDSTROM MRN: [id number] DOB: 28-Oct-1936  Subjective: Stephen White is a 86 y.o. year old male who is a primary care patient of Olin Hauser, DO. The Care Management team was consulted for assistance with care management and care coordination needs.    Engaged with patient by telephone for follow up visit in response to provider referral for pharmacy case management and/or care coordination services.   Objective:  Lab Results  Component Value Date   CREATININE 2.31 (H) 03/09/2021   CREATININE 2.69 (H) 02/24/2021   CREATININE 2.62 (H) 09/08/2020    Lab Results  Component Value Date   HGBA1C 8.7 04/13/2022       Component Value Date/Time   CHOL 152 08/01/2020 0838   CHOL 162 11/07/2018 0958   TRIG 68 08/01/2020 0838   HDL 56 08/01/2020 0838   HDL 54 11/07/2018 0958   CHOLHDL 2.7 08/01/2020 0838   LDLCALC 82 08/01/2020 0838    BP Readings from Last 3 Encounters:  08/16/22 120/64  07/07/22 (!) 140/56  06/08/22 118/80    Care Plan  No Known Allergies  Medications Reviewed Today     Reviewed by Rennis Petty, RPH-CPP (Pharmacist) on 11/22/22 at 1734  Med List Status: <None>   Medication Order Taking? Sig Documenting Provider Last Dose Status Informant  ALPRAZolam (XANAX) 0.5 MG tablet [id number]  Take 1 tablet (0.5 mg total) by mouth 2 (two) times daily as needed for anxiety. May start with half pill for 0.14m dose. May increase in future if needed. KOlin Hauser DO  Active   aspirin 81 MG EC tablet [id number] Take 81 mg by mouth daily [provider]  Active   Carboxymethylcellulose Sodium (EYE DROPS OP) [id number] Apply to eye. [provider]  Active   Continuous Blood Gluc Receiver (FREESTYLE LIBRE 2 READER) DEVI [id number] by Does not apply route. [provider]  Active   Continuous Blood Gluc Sensor (FREESTYLE LIBRE [id number] 1 kit. [provider]  Active   famotidine (PEPCID) 10 MG tablet [id number] Take 1 tablet (10 mg total) by mouth daily.  Patient taking differently: Take 10 mg by mouth every other day. As needed   KOlin Hauser DO  Active   gabapentin (NEURONTIN) 300 MG capsule [id number] Take 300 mg by mouth as needed. [provider]  Active   hydrochlorothiazide (HYDRODIURIL) 25 MG tablet [id number] TAKE 1 TABLET(25 MG) BY MOUTH DAILY Karamalegos, ADevonne Doughty DO  Active   insulin aspart (NOVOLOG) 100 UNIT/ML FlexPen [id number] Take NovoLog 32-35 units with meals following direction from Endocrinologist [provider] Taking Active   insulin detemir (LEVEMIR) 100 UNIT/ML FlexPen [id number] Inject 18 Units into the skin daily. [provider] Taking Active   levalbuterol ([facility]FA) 45 MCG/ACT inhaler [id number] Inhale 1 puff into the lungs every 6 (six) hours as needed for wheezing. Parrett, TFonnie Mu NP  Active   losartan (COZAAR) 100 MG tablet [id number] Take 0.5 tablets (50 mg total) by mouth daily. KOlin Hauser DO  Active   Multiple Vitamins-Minerals ([facility] MULTIVITS) TABS [id number] Take by mouth. [provider]  Active   ONE TOUCH ULTRA TEST test strip [id number] 1 each by Other route as needed.  [provider]  Active            Med Note (Remer Macho J[place]  E   Fri Oct 03, 2015  9:40 AM) Received from: [place] [id number]  Place into the nose. [provider]  Active   vitamin B-12 (CYANOCOBALAMIN) 500 MCG tablet [id number]  Take 500 mcg by mouth daily. [provider]  Active             Care Plan : PharmD - Medication Managment and Assistance  Updates made by Rennis Petty, RPH-CPP since 11/22/2022 12:00 AM     Problem: Disease Progression      Long-Range Goal: Disease Progression Prevented or Minimized   Start Date: 02/02/2021  Expected End Date: 05/03/2021   Recent Progress: On track  Priority: High  Note:   Current Barriers:  Financial Barriers in complicated patient with multiple medical conditions including T2DM, CKD, GERD, HTN, HLD; patient has [employer] and reports copay for [place] is cost prohibitive at this time Approved to receive Novolog and Levemir from [employer] patient assistance program through 12/19/2022 Fear of hypoglycemia  Pharmacist Clinical Goal(s):  Over the next 90 days, patient will achieve adherence to monitoring guidelines and medication adherence to achieve therapeutic efficacy through collaboration with PharmD and provider.   Interventions: 1:1 collaboration with Olin Hauser, DO regarding development and update of comprehensive plan of care as evidenced by provider attestation and co-signature Inter-disciplinary care team collaboration (see longitudinal plan of care) Perform chart review.  Office Visit with [facility] Endocrinology on 11/15. Provider advises patient to: Increase Levemir to 18 units QHS Continue Novolog 32 - 35 units with meals  Scan the blood glucose at least 4 times daily. Bring Libre to each follow up appt.  Follow up 3 months  Office Visit with [facility] [place] [facility] [place] on 11/29 Today reports continues to have trouble with fast breathing on and off, but feels like the 1/2 tablet (0.25 mg) of the alprazolam 0.5 mg tablets prescribed to him by his PCP helps. Discuss using caution with use of alprazolam. Remind patient sit down or lay down each time after taking alprazolam to reduce risk of fall  Medication Assistance: Collaborating with [place] to request her aid to patient with re-enrollment in [employer] patient assistance program for 2024 calendar year Patient reports he mailed his portion of application documents back to [place] a couple of weeks ago Confirms has plenty of Novolog and Levemir to last through end of current  calendar year  Type 2 Diabetes Patient followed by [facility] Endocrinology for management Uncontrolled; current treatment: Levemir: 18 units nightly NovoLog 32 - 35 units with meals per Endocrinologist Again counsel patient on importance of taking insulin regimen as directed by Endocrinologist and calling Endocrinology as needed for concerns related to his blood sugar Have counseled on s/s of low blood sugar, prevention of lows and how to manage lows Patient using Freestyle Libre 2 monitor for low blood sugar alarm Patient using FreeStyle Libre 2 continuous glucose monitor Orders sensor refills from [employer] Target goal range: 80-180 mg/dL Alarms set to go off for readings outside of range: 70-240 mg/dL Have counseled on low carbohydrate/low sodium snack options Note [name], manufacturer of Levemir, now planning to phase out Levemir insulin. Collaborate with Endocrinology office to see if provider would like to switch patient from Levemir to a different long-acting insulin Receive message back from [employer] in office who states Endocrinologist will plan to switch patient from Levemir to [place] insulin Will collaborate with [facility]  CPhT Patient made aware   Patient Goals/Self-Care Activities Over the next 90 days, patient will:  - take medications as prescribed using weekly pillbox - monitor blood glucose as directed by Endocrinologist  Note patient using FreeStyle Libre 2 continuous glucose monitor - check blood pressure, document, and provide at future appointments - attend medical appointments as scheduled       Plan: Clinical Pharmacist will outreach to patient by telephone on 12/22/2022 at 1:00 PM   Wallace Cullens, PharmD, Para [place], [place] [phone]

## 2022-11-22 NOTE — Patient Instructions (Signed)
Goals Addressed             This Visit's Progress    Pharmacy Goals       Our goal A1c is less than 7%. This corresponds with fasting sugars less than 130 and 2 hour after meal sugars less than 180. Please continue to monitor home blood sugar.  Please check your home blood pressure, keep a log of the results and have this record for [place] to review during our next telephone appointment   Feel free to call me with any questions or concerns. I look forward to our next call!   Wallace Cullens, PharmD, Para [place], CPP Clinical Pharmacist [facility] [phone]

## 2022-11-26 ENCOUNTER — Telehealth: Payer: Self-pay | Admitting: Pharmacy Technician

## 2022-11-26 DIAGNOSIS — Z596 Low income: Secondary | ICD-10-CM

## 2022-11-26 NOTE — Progress Notes (Signed)
[place] [facility])                                            [place] Team    11/26/2022  Stephen White [birth year] [id number]  Received both patient and provider portion(s) of patient assistance application(s) for [place] and Novolog. Faxed completed application and required documents into [employer].    Stephen White, [place]  [phone]

## 2022-11-29 ENCOUNTER — Telehealth: Payer: Medicare HMO

## 2022-12-22 ENCOUNTER — Telehealth: Payer: Medicare HMO

## 2023-01-05 ENCOUNTER — Telehealth: Payer: Medicare HMO

## 2023-01-10 ENCOUNTER — Ambulatory Visit: Payer: Medicare HMO | Admitting: Pharmacist

## 2023-01-10 DIAGNOSIS — E1121 Type 2 diabetes mellitus with diabetic nephropathy: Secondary | ICD-10-CM

## 2023-01-10 MED ORDER — FREESTYLE PRECISION NEO TEST VI STRP: ORAL_STRIP | 12 refills | Status: DC

## 2023-01-10 NOTE — Progress Notes (Unsigned)
01/10/2023 Name: Stephen White MRN: [id number] DOB: August 09, 1936  Chief Complaint  Patient presents with   Medication Assistance    Stephen White is a 87 y.o. year old male who presented for a telephone visit.   They were referred to the pharmacist by their PCP for assistance in managing diabetes, hypertension, hyperlipidemia, and medication access.    Subjective:  Today reports continues to have trouble with fast breathing on and off, but that the 1/2 tablet (0.25 mg) of the alprazolam 0.5 mg tablets prescribed to him by his PCP helps. Again discuss using caution with use of alprazolam. Remind patient sit down or lay down each time after taking alprazolam to reduce risk of fall  Care Team: Primary Care Provider: Olin Hauser, DO Cardiologist: Isaias Cowman, MD Endocrinologist Eugenia Pancoast, [place]; Next Scheduled Visit: 02/07/2023 Pulmonologist: Melvenia Needles, NP Nephrologist: Murlean Iba, MD; Next Scheduled Visit: 02/28/2023 Dermatologist: Ralene Bathe, MD; Next Scheduled Visit: 02/02/2023  Medication Access/Adherence  Current Pharmacy:  [facility] [place], [place] [address] Phone: [phone] Fax: [place], [place] [address] [address] Phone: [phone] Fax: [id number] [place], [place] [address] Phone: [phone] Fax: [phone]  [place], [place] - [address] [place] [address] Phone: [phone] Fax: [phone]   Patient reports affordability concerns with their medications: No  Patient reports access/transportation concerns to their pharmacy: No  Patient reports adherence concerns with their medications:  No      Diabetes:  Current medications:  Levemir: 18 units nightly NovoLog 32 - 35 units with meals per Endocrinologist  Patient using FreeStyle Libre 2 continuous glucose monitor Orders sensor refills from [employer] Target goal range: 80-180 mg/dL Alarms set to go off for readings outside of range: 70-240 mg/dL  Patient also has a One Touch Ultra meter, but is currently out of test strips - Note One Touch Ultra meter is no longer manufactured - Patient interested to know if he can obtain the [employer] Precision Neo test strips (to use in the built-in blood glucose meter within his [employer] 2 Reader) from his local pharmacy  Current medication access support: Collaborating with provider and [place] for aid to patient with re-enrollment in [employer] patient assistance program for 2024 calendar year - Note collaborated with [facility] Endocrinology office on 12/4 to see if provider would like to switch patient from [place] to a different long-acting insulin as manufacturer planning to phase out Levemir insulin in 2024 Received message back advising provider planning to switch patient's long-acting insulin from Levemir to [place] - Today patient reports he received a letter from [employer] stating that he has been approved for re-enrollment in the assistance program for 2024 calendar year States he currently has sufficient supply of Levemir to last until next appointment with Endocrinologist on 02/07/2023, so plans to continue Levemir for now and then discuss change to [place] further with Endocrinologist at appointment   Objective:  Lab Results  Component Value Date   HGBA1C 8.7 04/13/2022    Lab Results  Component Value Date   CREATININE 2.31 (H) 03/09/2021   BUN 49 (H) 03/09/2021  NA 137 03/09/2021   K 4.1 03/09/2021   CL 103 03/09/2021   CO2 26 03/09/2021    Lab Results  Component Value Date   CHOL 152 08/01/2020   HDL 56 08/01/2020   LDLCALC  82 08/01/2020   TRIG 68 08/01/2020   CHOLHDL 2.7 08/01/2020    Medications Reviewed Today     Reviewed by Rennis Petty, RPH-CPP (Pharmacist) on 11/22/22 at 1734  Med List Status: <None>   Medication Order Taking? Sig Documenting Provider Last Dose Status Informant  ALPRAZolam (XANAX) 0.5 MG tablet [id number]  Take 1 tablet (0.5 mg total) by mouth 2 (two) times daily as needed for anxiety. May start with half pill for 0.'25mg'$  dose. May increase in future if needed. Olin Hauser, DO  Active   aspirin 81 MG EC tablet [id number]  Take 81 mg by mouth daily [provider]  Active   Carboxymethylcellulose Sodium (EYE DROPS OP) [id number]  Apply to eye. [provider]  Active   Continuous Blood Gluc Receiver (FREESTYLE LIBRE 2 READER) [place] [id number]  by Does not apply route. [provider]  Active   Continuous Blood Gluc Sensor (FREESTYLE LIBRE [place] [id number]  1 kit. [provider]  Active   famotidine (PEPCID) 10 MG tablet [id number]  Take 1 tablet (10 mg total) by mouth daily.  Patient taking differently: Take 10 mg by mouth every other day. As needed   Olin Hauser, DO  Active   gabapentin (NEURONTIN) 300 MG capsule [id number]  Take 300 mg by mouth as needed. [provider]  Active   hydrochlorothiazide (HYDRODIURIL) 25 MG tablet [id number]  TAKE 1 TABLET(25 MG) BY MOUTH DAILY Karamalegos, Devonne Doughty, DO  Active   insulin aspart (NOVOLOG) 100 UNIT/ML FlexPen [id number] Yes Take NovoLog 32-35 units with meals following direction from Endocrinologist [provider] Taking Active   insulin detemir (LEVEMIR) 100 UNIT/ML FlexPen [id number] Yes Inject 18 Units into the skin daily. [provider] Taking Active   levalbuterol [facility] HFA) 45 MCG/ACT inhaler [id number]  Inhale 1 puff into the lungs every 6 (six) hours as needed for wheezing. Parrett, Fonnie Mu, NP  Active   losartan (COZAAR) 100 MG  tablet [id number]  Take 0.5 tablets (50 mg total) by mouth daily. Olin Hauser, DO  Active   Multiple Vitamins-Minerals [facility] COMPLETE SENIOR MULTIVITS) TABS [id number]  Take by mouth. [provider]  Active   ONE TOUCH ULTRA TEST test strip [id number]  1 each by Other route as needed.  [provider]  Active            Med Note Doristine Devoid   Fri Oct 03, 2015  9:40 AM) Received from: [place] [id number]  Place into the nose. [provider]  Active   vitamin B-12 (CYANOCOBALAMIN) 500 MCG tablet [id number]  Take 500 mcg by mouth daily. [provider]  Active               Assessment/Plan:   Again discuss using caution with use of alprazolam. Remind patient sit down or lay down each time after taking alprazolam to reduce risk of fall  Diabetes: - Counsel patient on importance of taking insulin regimen as directed by Endocrinologist and calling Endocrinology as needed for concerns related to his blood sugar  - Have counseled on s/s of low blood sugar, prevention of lows and how to manage lows Patient using [facility]  2 monitor for low blood sugar alarm Patient carries glucose tablets to have in case of hypoglycemia - As requested, CPP will send a prescription for the Freestyle Libre Precision Neo test strips to [facility] for patient    Follow Up Plan: Clinical Pharmacist will follow up with pharmacy within the next 2 days regarding diabetes testing supply prescription coverage  Wallace Cullens, PharmD, Para [place], [facility] [phone]

## 2023-01-11 ENCOUNTER — Telehealth: Payer: Self-pay | Admitting: Pharmacy Technician

## 2023-01-11 DIAGNOSIS — Z596 Low income: Secondary | ICD-10-CM

## 2023-01-11 MED ORDER — FREESTYLE PRECISION NEO TEST VI STRP: ORAL_STRIP | 12 refills | Status: DC

## 2023-01-11 NOTE — Progress Notes (Addendum)
[facility])                                            [place] Team    01/11/2023  JAMERION CABELLO Feb 10, 1936 [id number]  Care coordination call placed to [employer] in regard to [place] and Novolog application.   Spoke to [place] who informs patient is APPROVED 12/31/22-12/20/23. Medications will automatically fill and ship to provider's address on file based on last fill date in 2023. Patient may call [place] at [phone] if shipment has not arrived and patient does not have sufficient supply.  Jeffren Dombek P. Marlow Berenguer, [place]  [phone]

## 2023-01-12 ENCOUNTER — Ambulatory Visit: Payer: Medicare HMO | Admitting: Pharmacist

## 2023-01-12 DIAGNOSIS — Z794 Long term (current) use of insulin: Secondary | ICD-10-CM

## 2023-01-12 MED ORDER — ONETOUCH VERIO FLEX SYSTEM W/DEVICE KIT: PACK | 0 refills | Status: AC

## 2023-01-12 MED ORDER — ONETOUCH DELICA PLUS LANCET30G MISC: 3 refills | Status: AC

## 2023-01-12 MED ORDER — ONETOUCH VERIO VI STRP: ORAL_STRIP | 3 refills | Status: AC

## 2023-01-12 NOTE — Progress Notes (Signed)
   01/12/2023 Name: Stephen White MRN: [id number] DOB: May 20, 1936   Placed coordination of care call to [place] to follow up regarding cost/coverage for [facility] Precision Neo test strips Rx sent to pharmacy on 01/10/2023. Speak with [facility] RPh who advises [name] is no longer in-network with patient's [name] plan.  Find [facility] is in network with [name] plan for 2024. CPP sends Rx for [employer] Precision Neo test strips to [facility]. Follow up with [facility] who advises Freestyle brand of test strips are not covered by patient's plan; preferred: One Touch products  Follow up with patient today regarding options for his diabetes testing supplies. Note patient using glucometer for testing in addition to ongoing use of Freestyle Libre 2 CGM.   Assessment/Plan:   As requested, CPP will send Rx for One Touch Verio meter and testing supplies to [facility] for patient  Follow Up Plan: Clinical Pharmacist will follow up with patient by telephone again in 3 months  Wallace Cullens, PharmD, [place], [facility] [phone]

## 2023-01-12 NOTE — Patient Instructions (Signed)
Goals Addressed             This Visit's Progress    Pharmacy Goals       Our goal A1c is less than 7%. This corresponds with fasting sugars less than 130 and 2 hour after meal sugars less than 180. Please continue to monitor home blood sugar.  Please check your home blood pressure, keep a log of the results and have this record for [place] to review during our next telephone appointment   Feel free to call me with any questions or concerns. I look forward to our next call!  Wallace Cullens, PharmD, Para [place], CPP Clinical Pharmacist [facility] [phone]

## 2023-01-31 ENCOUNTER — Other Ambulatory Visit: Payer: Self-pay | Admitting: Family Medicine

## 2023-01-31 DIAGNOSIS — I129 Hypertensive chronic kidney disease with stage 1 through stage 4 chronic kidney disease, or unspecified chronic kidney disease: Secondary | ICD-10-CM

## 2023-02-01 NOTE — Telephone Encounter (Signed)
Requested medications are due for refill today.  yes  Requested medications are on the active medications list.  yes  Last refill. 07/23/2022 #90 1 rf  Future visit scheduled.   no  Notes to clinic.  Labs are expired.   Requested Prescriptions  Pending Prescriptions Disp Refills   losartan (COZAAR) 100 MG tablet [Pharmacy Med Name: LOSARTAN 100MG TABLETS] 90 tablet 1    Sig: TAKE 1/2 TABLET(50MG) BY MOUTH ONCE DAILY     Cardiovascular:  Angiotensin Receptor Blockers Failed - 01/31/2023  2:38 PM      Failed - Cr in normal range and within 180 days    Creat  Date Value Ref Range Status  02/24/2021 2.69 (H) 0.70 - 1.11 mg/dL Final    Comment:    For patients >76 years of age, the reference limit for Creatinine is approximately 13% higher for people identified as African-American. .    Creatinine, Ser  Date Value Ref Range Status  03/09/2021 2.31 (H) 0.61 - 1.24 mg/dL Final         Failed - K in normal range and within 180 days    Potassium  Date Value Ref Range Status  03/09/2021 4.1 3.5 - 5.1 mmol/L Final         Failed - Valid encounter within last 6 months    Recent Outpatient Visits           2 weeks ago Type 2 diabetes mellitus with diabetic nephropathy, with long-term current use of insulin ([place])   [place], Grayland Ormond A, RPH-CPP   3 weeks ago Type 2 diabetes mellitus with diabetic nephropathy, with long-term current use of insulin ([place])   [facility], Grayland Ormond A, RPH-CPP   2 months ago Type 2 diabetes mellitus with diabetic nephropathy, with long-term current use of insulin ([place])   [place], Grayland Ormond A, RPH-CPP   3 months ago Type 2 diabetes mellitus with diabetic nephropathy, with long-term current use of insulin ([place])   [place], Grayland Ormond A, RPH-CPP   6 months ago GAD (generalized anxiety disorder)   [place], DO       Future Appointments             In 2 weeks Ralene Bathe, MD [place] - Patient is not pregnant      Passed - Last BP in normal range    BP Readings from Last 1 Encounters:  08/16/22 120/64

## 2023-02-02 ENCOUNTER — Ambulatory Visit: Payer: Medicare HMO | Admitting: Dermatology

## 2023-02-12 IMAGING — CT CT HEAD W/O CM
3 series · 15 of 47 positions shown, 18 images · non-contrast
Comparison: Friday March, 2020

CLINICAL DATA: Dizziness, unspecified

EXAM:
CT HEAD WITHOUT CONTRAST
TECHNIQUE: Contiguous axial images were obtained from the base of the skull
through the vertex without intravenous contrast.

[Series 2: head wo · axial · 0.47mm/px · z∈[-134,-9]mm · 9 of 31 slices shown, 12 images]
[im 3/31  brain]
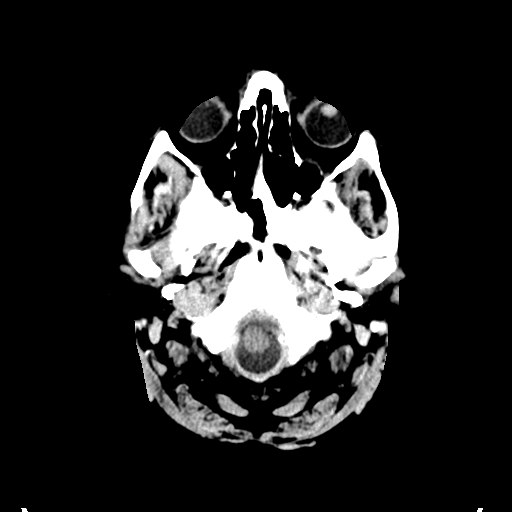
[im 3/31  bone]
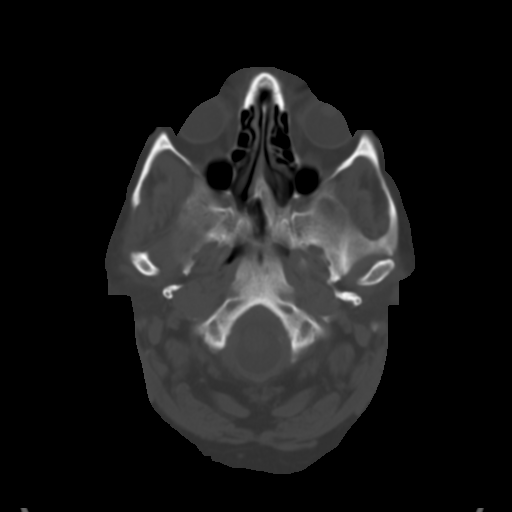
[im 6/31  brain]
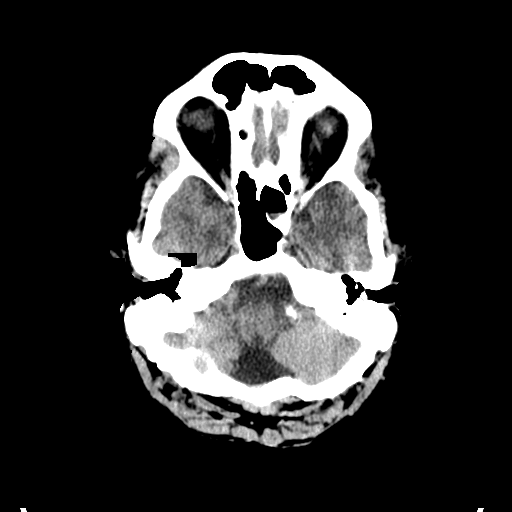
[im 9/31  brain]
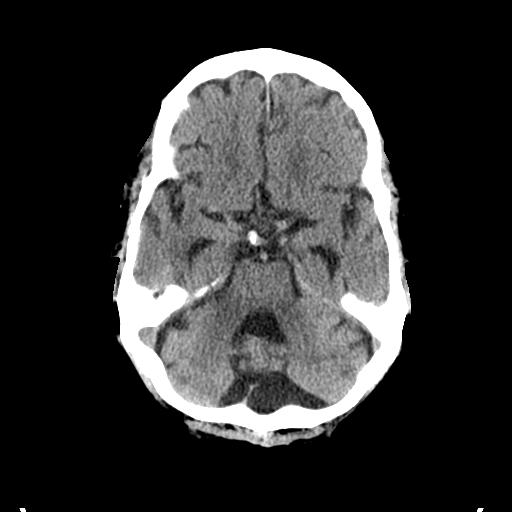
[im 12/31  brain]
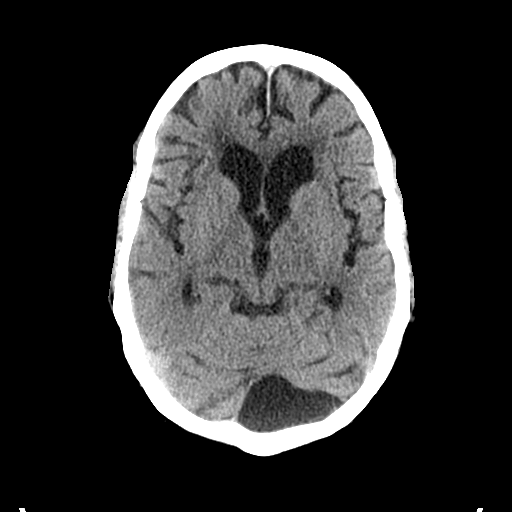
[im 16/31  brain]
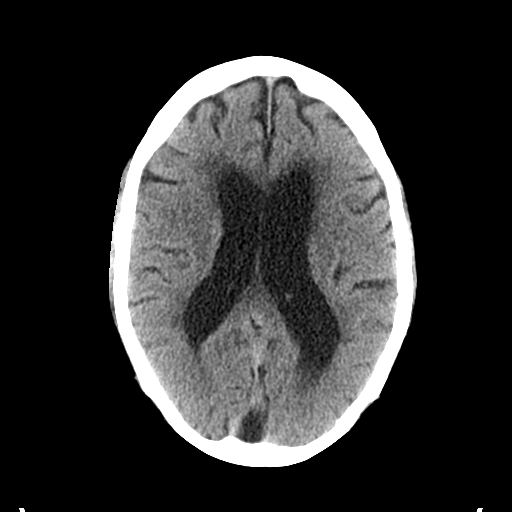
[im 16/31  bone]
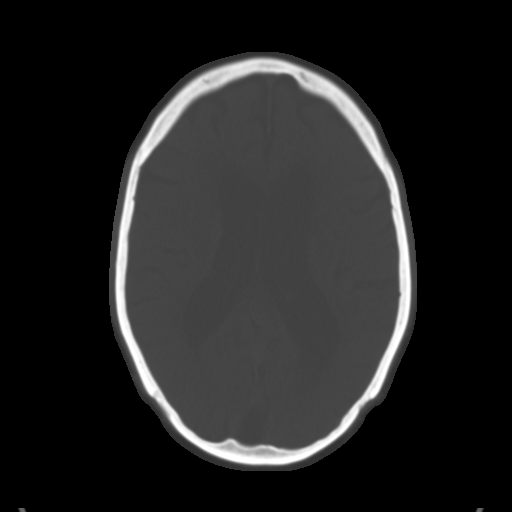
[im 19/31  brain]
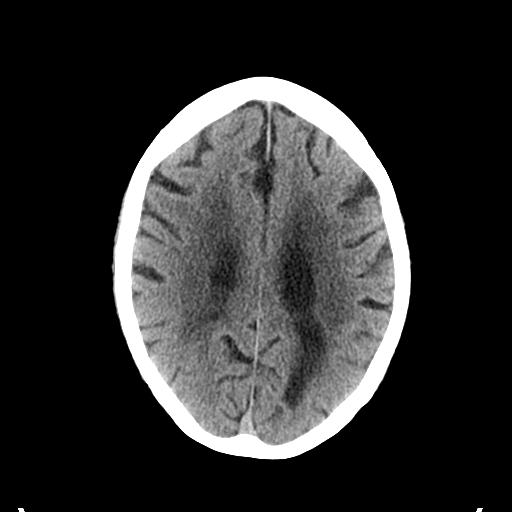
[im 22/31  brain]
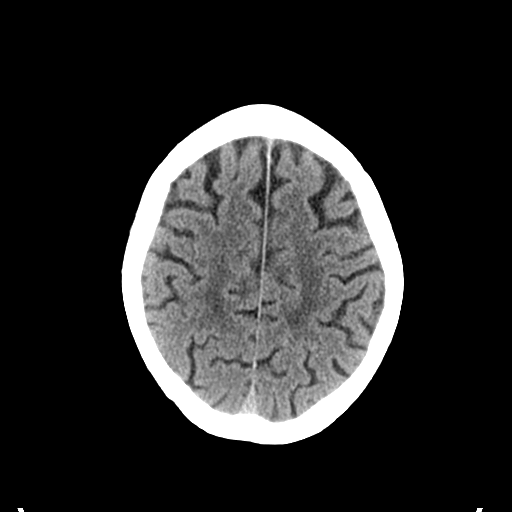
[im 25/31  brain]
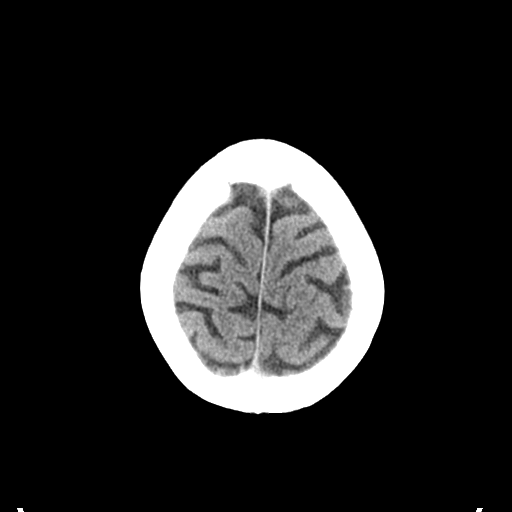
[im 28/31  brain]
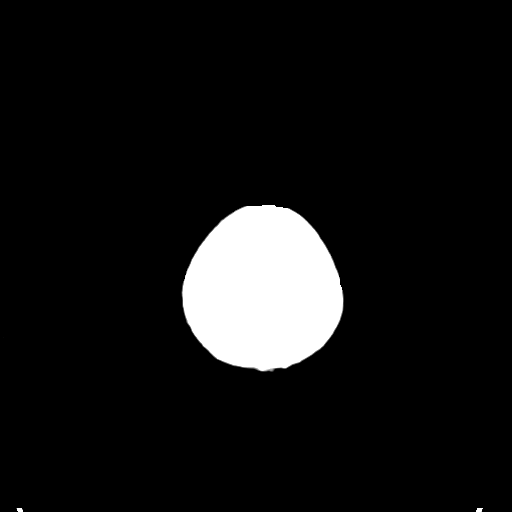
[im 28/31  bone]
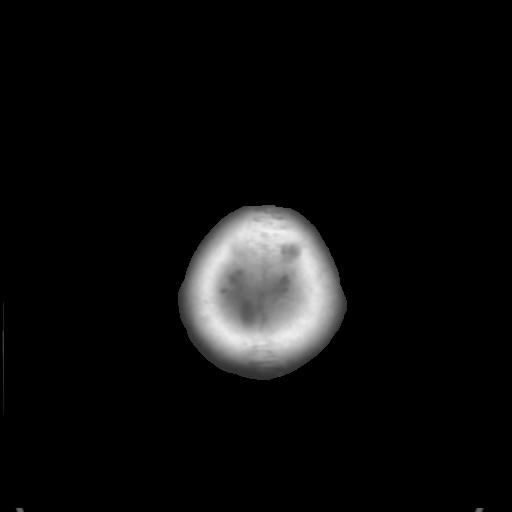

[Series 4: coronal soft tissue · coronal · 0.33mm/px · 3 of 77 slices shown]
[im 26/77  brain]
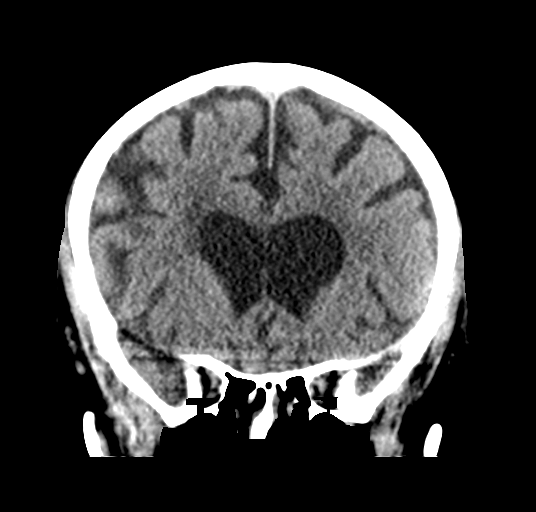
[im 34/77  brain]
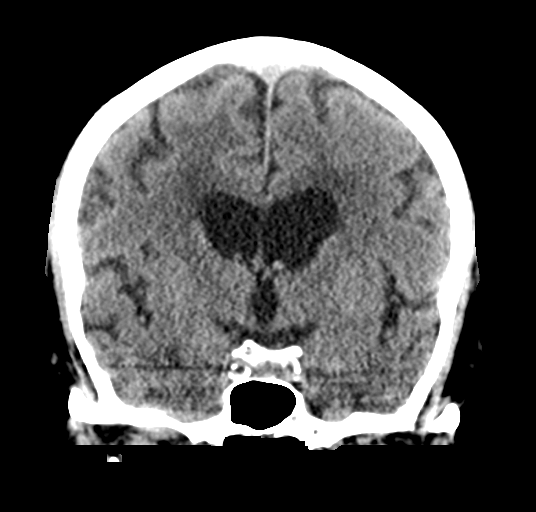
[im 43/77  brain]
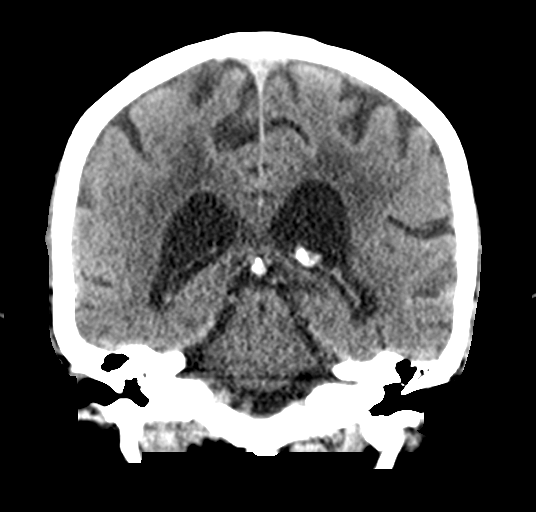

[Series 5: sagittal soft tissue · sagittal · 0.32mm/px · 3 of 61 slices shown]
[im 21/61  brain]
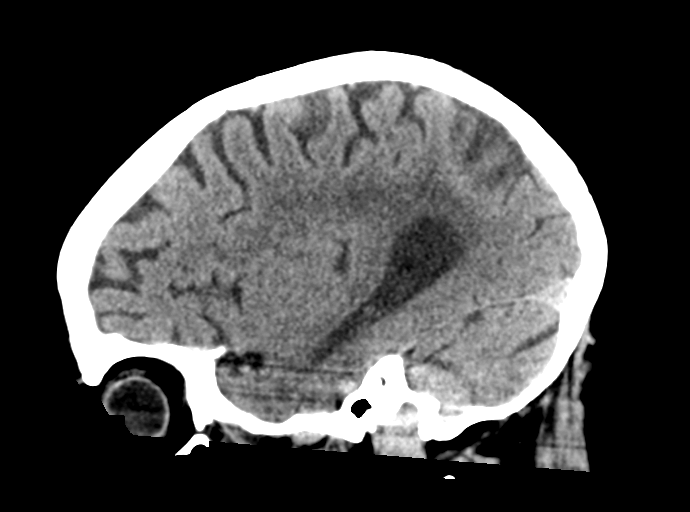
[im 31/61  brain]
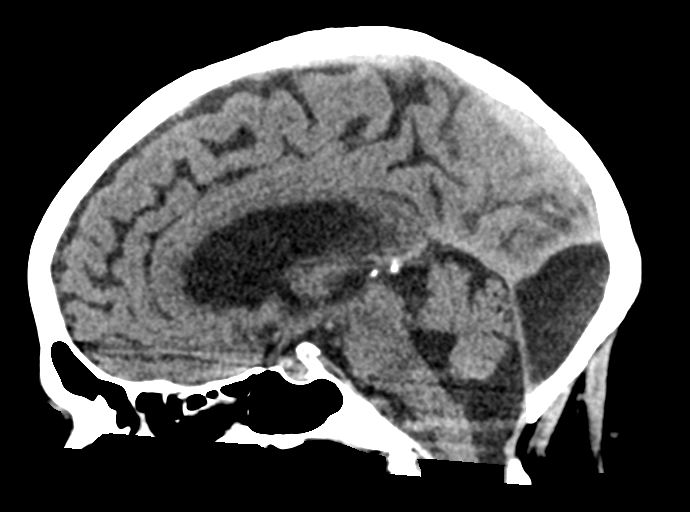
[im 41/61  brain]
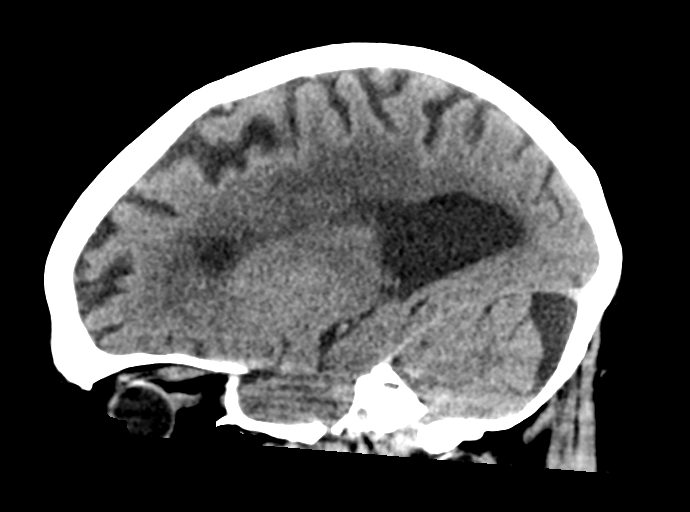

[15 of 47 positions shown; findings below may reference images not displayed]

FINDINGS: Brain: No evidence of acute infarction, hemorrhage, hydrocephalus,
extra-axial collection or mass lesion/mass effect. Signs of atrophy
and ventricular prominence as on previous imaging. Presumed
arachnoid cyst in the posterior cranial fossa in a to the LEFT of
midline unchanged.

Vascular: No hyperdense vessel or unexpected calcification.

Skull: Normal. Negative for fracture or focal lesion.

Sinuses/Orbits: Visualized paranasal sinuses and orbits with signs
of LEFT sphenoid sinus disease, otherwise negative.

Other: None
IMPRESSION: 1. No acute intracranial pathology.
2. Signs of atrophy and ventricular prominence as on previous
imaging.
3. Presumed arachnoid cyst in the posterior cranial fossa in a to
the LEFT of midline unchanged.
4. LEFT sphenoid sinus disease.

## 2023-02-16 ENCOUNTER — Ambulatory Visit: Payer: Medicare HMO | Admitting: Dermatology

## 2023-02-16 DIAGNOSIS — L820 Inflamed seborrheic keratosis: Secondary | ICD-10-CM | POA: Diagnosis not present

## 2023-02-16 DIAGNOSIS — L570 Actinic keratosis: Secondary | ICD-10-CM

## 2023-02-16 DIAGNOSIS — L578 Other skin changes due to chronic exposure to nonionizing radiation: Secondary | ICD-10-CM

## 2023-02-16 NOTE — Patient Instructions (Addendum)
 Cryotherapy Aftercare  Wash gently with soap and water everyday.   Apply Vaseline and Band-Aid daily until healed. Due to recent changes in healthcare laws, you may see results of your pathology and/or laboratory studies on MyChart before the doctors have had a chance to review them. We understand that in some cases there may be results that are confusing or concerning to you. Please understand that not all results are received at the same time and often the doctors may need to interpret multiple results in order to provide you with the best plan of care or course of treatment. Therefore, we ask that you please give us 2 business days to thoroughly review all your results before contacting the office for clarification. Should we see a critical lab result, you will be contacted sooner.   If You Need Anything After Your Visit  If you have any questions or concerns for your doctor, please call our main line at [phone] and press option 4 to reach your doctor's medical assistant. If no one answers, please leave a voicemail as directed and we will return your call as soon as possible. Messages left after 4 pm will be answered the following business day.   You may also send us a message via MyChart. We typically respond to MyChart messages within 1-2 business days.  For prescription refills, please ask your pharmacy to contact our office. Our fax number is [phone].  If you have an urgent issue when the clinic is closed that cannot wait until the next business day, you can page your doctor at the number below.    Please note that while we do our best to be available for urgent issues outside of office hours, we are not available 24/7.   If you have an urgent issue and are unable to reach us, you may choose to seek medical care at your doctor's office, retail clinic, urgent care center, or emergency room.  If you have a medical emergency, please immediately call 911 or go to the emergency  department.  Pager Numbers  - Dr. Kowalski: [phone]  - Dr. Moye: [phone]  - Dr. Stewart: [phone]  In the event of inclement weather, please call our main line at [phone] for an update on the status of any delays or closures.  Dermatology Medication Tips: Please keep the boxes that topical medications come in in order to help keep track of the instructions about where and how to use these. Pharmacies typically print the medication instructions only on the boxes and not directly on the medication tubes.   If your medication is too expensive, please contact our office at [phone] option 4 or send us a message through MyChart.   We are unable to tell what your co-pay for medications will be in advance as this is different depending on your insurance coverage. However, we may be able to find a substitute medication at lower cost or fill out paperwork to get insurance to cover a needed medication.   If a prior authorization is required to get your medication covered by your insurance company, please allow us 1-2 business days to complete this process.  Drug prices often vary depending on where the prescription is filled and some pharmacies may offer cheaper prices.  The website www.goodrx.com contains coupons for medications through different pharmacies. The prices here do not account for what the cost may be with help from insurance (it may be cheaper with your insurance), but the website can give you the   price if you did not use any insurance.  - You can print the associated coupon and take it with your prescription to the pharmacy.  - You may also stop by our office during regular business hours and pick up a GoodRx coupon card.  - If you need your prescription sent electronically to a different pharmacy, notify our office through [facility] MyChart or by phone at [phone] option 4.     Si Usted Necesita Algo Despus de Su Visita  Tambin puede enviarnos un  mensaje a travs de MyChart. Por lo general respondemos a los mensajes de MyChart en el transcurso de 1 a 2 das hbiles.  Para renovar recetas, por favor pida a su farmacia que se ponga en contacto con nuestra oficina. Nuestro nmero de fax es el [phone].  Si tiene un asunto urgente cuando la clnica est cerrada y que no puede esperar hasta el siguiente da hbil, puede llamar/localizar a su doctor(a) al nmero que aparece a continuacin.   Por favor, tenga en cuenta que aunque hacemos todo lo posible para estar disponibles para asuntos urgentes fuera del horario de oficina, no estamos disponibles las 24 horas del da, los 7 das de la semana.   Si tiene un problema urgente y no puede comunicarse con nosotros, puede optar por buscar atencin mdica  en el consultorio de su doctor(a), en una clnica privada, en un centro de atencin urgente o en una sala de emergencias.  Si tiene una emergencia mdica, por favor llame inmediatamente al 911 o vaya a la sala de emergencias.  Nmeros de bper  - Dr. Kowalski: [phone]  - Dra. Moye: [phone]  - Dra. Stewart: [phone]  En caso de inclemencias del tiempo, por favor llame a nuestra lnea principal al [phone] para una actualizacin sobre el estado de cualquier retraso o cierre.  Consejos para la medicacin en dermatologa: Por favor, guarde las cajas en las que vienen los medicamentos de uso tpico para ayudarle a seguir las instrucciones sobre dnde y cmo usarlos. Las farmacias generalmente imprimen las instrucciones del medicamento slo en las cajas y no directamente en los tubos del medicamento.   Si su medicamento es muy caro, por favor, pngase en contacto con nuestra oficina llamando al [phone] y presione la opcin 4 o envenos un mensaje a travs de MyChart.   No podemos decirle cul ser su copago por los medicamentos por adelantado ya que esto es diferente dependiendo de la cobertura de su seguro. Sin embargo,  es posible que podamos encontrar un medicamento sustituto a menor costo o llenar un formulario para que el seguro cubra el medicamento que se considera necesario.   Si se requiere una autorizacin previa para que su compaa de seguros cubra su medicamento, por favor permtanos de 1 a 2 das hbiles para completar este proceso.  Los precios de los medicamentos varan con frecuencia dependiendo del lugar de dnde se surte la receta y alguna farmacias pueden ofrecer precios ms baratos.  El sitio web www.goodrx.com tiene cupones para medicamentos de diferentes farmacias. Los precios aqu no tienen en cuenta lo que podra costar con la ayuda del seguro (puede ser ms barato con su seguro), pero el sitio web puede darle el precio si no utiliz ningn seguro.  - Puede imprimir el cupn correspondiente y llevarlo con su receta a la farmacia.  - Tambin puede pasar por nuestra oficina durante el horario de atencin regular y recoger una tarjeta de cupones de GoodRx.  - Si necesita que   su receta se enve electrnicamente a una farmacia diferente, informe a nuestra oficina a travs de MyChart de [place] o por telfono llamando al [phone] y presione la opcin 4.  

## 2023-02-16 NOTE — Progress Notes (Signed)
Follow-Up Visit   Subjective  Stephen White is a 87 y.o. male who presents for the following: Follow-up (6 months f/u on precancers on his face and scalp ). The patient has  lesions to be evaluated, some may be new or changing.   The following portions of the chart were reviewed this encounter and updated as appropriate:   Tobacco  Allergies  Meds  Problems  Med Hx  Surg Hx  Fam Hx     Review of Systems:  No other skin or systemic complaints except as noted in HPI or Assessment and Plan.  Objective  Well appearing patient in no apparent distress; mood and affect are within normal limits.  A focused examination was performed including face and scalp. Relevant physical exam findings are noted in the Assessment and Plan.  right side burn x 1, left hand x 2 (3) Stuck-on, waxy, tan-brown papules -- Discussed benign etiology and prognosis.   face x 16 (16) Erythematous thin papules/macules with gritty scale.    Assessment & Plan  Inflamed seborrheic keratosis (3) right side burn x 1, left hand x 2  Symptomatic, irritating, patient would like treated.   Destruction of lesion - right side burn x 1, left hand x 2 Complexity: simple   Destruction method: cryotherapy   Informed consent: discussed and consent obtained   Timeout:  patient name, date of birth, surgical site, and procedure verified Lesion destroyed using liquid nitrogen: Yes   Region frozen until ice ball extended beyond lesion: Yes   Outcome: patient tolerated procedure well with no complications   Post-procedure details: wound care instructions given    AK (actinic keratosis) (16) face x 16  Actinic keratoses are precancerous spots that appear secondary to cumulative UV radiation exposure/sun exposure over time. They are chronic with expected duration over 1 year. A portion of actinic keratoses will progress to squamous cell carcinoma of the skin. It is not possible to reliably predict which spots will  progress to skin cancer and so treatment is recommended to prevent development of skin cancer.  Recommend daily broad spectrum sunscreen SPF 30+ to sun-exposed areas, reapply every 2 hours as needed.  Recommend staying in the shade or wearing long sleeves, sun glasses (UVA+UVB protection) and wide brim hats (4-inch brim around the entire circumference of the hat). Call for new or changing lesions.   Destruction of lesion - face x 16 Complexity: simple   Destruction method: cryotherapy   Informed consent: discussed and consent obtained   Timeout:  patient name, date of birth, surgical site, and procedure verified Lesion destroyed using liquid nitrogen: Yes   Region frozen until ice ball extended beyond lesion: Yes   Outcome: patient tolerated procedure well with no complications   Post-procedure details: wound care instructions given    Actinic skin damage  Actinic Damage - chronic, secondary to cumulative UV radiation exposure/sun exposure over time - diffuse scaly erythematous macules with underlying dyspigmentation - Recommend daily broad spectrum sunscreen SPF 30+ to sun-exposed areas, reapply every 2 hours as needed.  - Recommend staying in the shade or wearing long sleeves, sun glasses (UVA+UVB protection) and wide brim hats (4-inch brim around the entire circumference of the hat). - Call for new or changing lesions.  Return in about 6 months (around 08/17/2023) for AKs, ISKs.  Marta Lamas, CMA, am acting as scribe for Sarina Ser, MD .  Documentation: I have reviewed the above documentation for accuracy and completeness, and I agree with  the above.  Sarina Ser, MD

## 2023-02-23 ENCOUNTER — Encounter: Payer: Self-pay | Admitting: Dermatology

## 2023-02-25 ENCOUNTER — Ambulatory Visit (INDEPENDENT_AMBULATORY_CARE_PROVIDER_SITE_OTHER): Payer: Medicare HMO

## 2023-02-25 VITALS — Wt 184.0 lb

## 2023-02-25 DIAGNOSIS — Z0000 Encounter for general adult medical examination without abnormal findings: Secondary | ICD-10-CM | POA: Diagnosis not present

## 2023-02-25 NOTE — Progress Notes (Signed)
I connected with  Stephen White on 02/25/23 by a audio enabled telemedicine application and verified that I am speaking with the correct person using two identifiers.  Patient Location: Home  Provider Location: Office/Clinic  I discussed the limitations of evaluation and management by telemedicine. The patient expressed understanding and agreed to proceed.  Subjective:   Stephen White is a 87 y.o. male who presents for Medicare Annual/Subsequent preventive examination.  Review of Systems     Cardiac Risk Factors include: advanced age (>52mn, >>58women);diabetes mellitus;dyslipidemia;hypertension;male gender     Objective:    There were no vitals filed for this visit. There is no height or weight on file to calculate BMI.     02/25/2023    3:26 PM 02/19/2022    3:34 PM 04/03/2021    7:32 AM 03/09/2021    4:15 PM 08/12/2020    3:13 PM 12/18/2019   10:50 AM 06/08/2018    9:29 AM  Advanced Directives  Does Patient Have a Medical Advance Directive? No No Yes No Yes Yes Yes  Type of AComptrollerLiving will  [place] will [place] Mountain[place] will [place]ne[place] will  Does patient want to make changes to medical advance directive?   No - Patient declined      Copy of HHoffmanin Chart?   No - copy requested  No - copy requested No - copy requested No - copy requested  Would patient like information on creating a medical advance directive? No - Patient declined No - Patient declined  No - Patient declined       Current Medications (verified) Outpatient Encounter Medications as of 02/25/2023  Medication Sig   ALPRAZolam (XANAX) 0.5 MG tablet Take 1 tablet (0.5 mg total) by mouth 2 (two) times daily as needed for anxiety. May start with half pill for 0.'25mg'$  dose. May increase in future if needed.   aspirin 81 MG EC tablet Take 81 mg by mouth daily   Blood Glucose  Monitoring Suppl (OBowman w/Device KIT Use to check blood sugar up to twice daily as directed   Carboxymethylcellulose Sodium (EYE DROPS OP) Apply to eye.   Continuous Blood Gluc Receiver (FREESTYLE LIBRE 2 READER) DEVI by Does not apply route.   Continuous Blood Gluc Sensor (FREESTYLE LIBRE 14 DAY SENSOR) MISC 1 kit.   famotidine (PEPCID) 10 MG tablet Take 1 tablet (10 mg total) by mouth daily. (Patient taking differently: Take 10 mg by mouth every other day. As needed)   gabapentin (NEURONTIN) 300 MG capsule Take 300 mg by mouth as needed.   glucose blood (ONETOUCH VERIO) test strip Use to check blood sugar twice daily   hydrochlorothiazide (HYDRODIURIL) 25 MG tablet TAKE 1 TABLET(25 MG) BY MOUTH DAILY   insulin aspart (NOVOLOG) 100 UNIT/ML FlexPen Take NovoLog 32-35 units with meals following direction from Endocrinologist   insulin detemir (LEVEMIR) 100 UNIT/ML FlexPen Inject 18 Units into the skin daily.   Lancets (ONETOUCH DELICA PLUS [id number] MISC Use to check blood sugar twice daily   levalbuterol (XOPENEX HFA) 45 MCG/ACT inhaler Inhale 1 puff into the lungs every 6 (six) hours as needed for wheezing.   losartan (COZAAR) 50 MG tablet Take 1 tablet (50 mg total) by mouth daily.   Multiple Vitamins-Minerals (PX COMPLETE SENIOR MULTIVITS) TABS Take by mouth.   SALINE MIST SPRAY NA Place into the nose.   vitamin B-12 (  CYANOCOBALAMIN) 500 MCG tablet Take 500 mcg by mouth daily.   No facility-administered encounter medications on file as of 02/25/2023.    Allergies (verified) Patient has no known allergies.   History: Past Medical History:  Diagnosis Date   Actinic keratosis    Allergy    Basal cell carcinoma 08/29/2018   L post inf ear/excision   Basal cell carcinoma 08/05/2020   L lat forehead above the brow - [facility]   Chronic kidney disease    GERD (gastroesophageal reflux disease)    Hyperlipidemia    Hypertension    Squamous cell carcinoma of skin  03/26/2015   L dorsal hand   Squamous cell carcinoma of skin 06/17/2015   R cheek   Squamous cell carcinoma of skin 05/10/2019   R hand dorsum/in situ   Squamous cell carcinoma of skin 08/05/2020   R of mideline forehead - [facility]   Thyroid disease    Type 2 diabetes mellitus ([place])    Vertigo    Wears hearing aid in both ears    Past Surgical History:  Procedure Laterality Date   CATARACT EXTRACTION W/PHACO Left 04/03/2021   Procedure: CATARACT EXTRACTION PHACO AND INTRAOCULAR LENS PLACEMENT (IOC) LEFT DIABETIC 17.22 01:53.3 15.2%;  Surgeon: Leandrew Koyanagi, MD;  Location: [place];  Service: Ophthalmology;  Laterality: Left;  Diabetic - insulin   EYE SURGERY     Family History  Problem Relation Age of Onset   Diabetes Mother    Diabetes Father    Stroke Father    Diabetes Brother    Diabetes Sister    Diabetes Sister    Diabetes Brother    Diabetes Brother    Diabetes Brother    Cancer Brother    Diabetes Brother    Social History   Socioeconomic History   Marital status: Married    Spouse name: Not on file   Number of children: 1   Years of education: Not on file   Highest education level: 12th grade  Occupational History   Occupation: retired  Tobacco Use   Smoking status: Former    Packs/day: 0.30    Years: 25.00    Total pack years: 7.50    Types: Cigarettes    Quit date: 12/19/1978    Years since quitting: 44.2   Smokeless tobacco: Former  Scientific laboratory technician Use: Never used  Substance and Sexual Activity   Alcohol use: Yes    Alcohol/week: 0.0 standard drinks of alcohol   Drug use: No   Sexual activity: Not on file  Other Topics Concern   Not on file  Social History Narrative   Not on file   Social Determinants of Health   Financial Resource Strain: Low Risk  (02/25/2023)   Overall Financial Resource Strain (CARDIA)    Difficulty of Paying Living Expenses: Not hard at all  Food Insecurity: No Food Insecurity (02/25/2023)   Hunger  Vital Sign    Worried About Running Out of Food in the Last Year: Never true    Ran Out of Food in the Last Year: Never true  Transportation Needs: No Transportation Needs (02/25/2023)   PRAPARE - Hydrologist (Medical): No    Lack of Transportation (Non-Medical): No  Physical Activity: Insufficiently Active (02/25/2023)   Exercise Vital Sign    Days of Exercise per Week: 2 days    Minutes of Exercise per Session: 20 min  Stress: No Stress Concern Present (02/25/2023)  [employer] - Occupational Stress Questionnaire    Feeling of Stress : Not at all  Social Connections: Moderately Isolated (02/25/2023)   Social Connection and Isolation Panel [NHANES]    Frequency of Communication with Friends and Family: Once a week    Frequency of Social Gatherings with Friends and Family: Once a week    Attends Religious Services: More than 4 times per year    Active Member of [employer] or Organizations: No    Attends Music therapist: Never    Marital Status: Married    Tobacco Counseling Counseling given: Not Answered   Clinical Intake:  Pre-visit preparation completed: Yes  Pain : No/denies pain     Nutritional Risks: None Diabetes: Yes CBG done?: No Did pt. bring in CBG monitor from home?: No  How often do you need to have someone help you when you read instructions, pamphlets, or other written materials from your doctor or pharmacy?: 1 - Never  Diabetic?yes Nutrition Risk Assessment:  Has the patient had any N/V/D within the last 2 months?  No  Does the patient have any non-healing wounds?  No  Has the patient had any unintentional weight loss or weight gain?  No   Diabetes:  Is the patient diabetic?  Yes  If diabetic, was a CBG obtained today?  No  Did the patient bring in their glucometer from home?  No  How often do you monitor your CBG's? continuous.   Financial Strains and Diabetes Management:  Are you  having any financial strains with the device, your supplies or your medication? No .  Does the patient want to be seen by Chronic Care Management for management of their diabetes?  No  Would the patient like to be referred to a Nutritionist or for Diabetic Management?  No   Diabetic Exams:  Diabetic Eye Exam: Completed 10/29/21. Overdue for diabetic eye exam. Pt has been advised about the importance in completing this exam.  Diabetic Foot Exam: Completed 07/07/22. Pt has been advised about the importance in completing this exam.   Interpreter Needed?: No  Information entered by :: Kirke Shaggy, LPN   Activities of Daily Living    02/25/2023    3:27 PM  In your present state of health, do you have any difficulty performing the following activities:  Hearing? 1  Vision? 1  Difficulty concentrating or making decisions? 0  Walking or climbing stairs? 1  Dressing or bathing? 0  Doing errands, shopping? 0  Preparing Food and eating ? N  Using the Toilet? N  In the past six months, have you accidently leaked urine? N  Do you have problems with loss of bowel control? N  Managing your Medications? N  Managing your Finances? N  Housekeeping or managing your Housekeeping? N    Patient Care Team: Olin Hauser, DO as PCP - General (Family Medicine) Eulogio Bear, MD as Consulting Physician (Ophthalmology) Gabriel Carina Betsey Holiday, MD as Physician Assistant (Endocrinology) Ralene Bathe, MD as Consulting Physician (Dermatology) Curley Spice, Virl Diamond, RPH-CPP as Pharmacist  Indicate any recent Medical Services you may have received from other than [facility] providers in the past year (date may be approximate).     Assessment:   This is a routine wellness examination for [employer].  Hearing/Vision screen Hearing Screening - Comments:: Wears aids Vision Screening - Comments:: Readers- [facility]  Dietary issues and exercise activities discussed: Current Exercise Habits: Home  exercise routine, Type of exercise: walking,  Time (Minutes): 20, Frequency (Times/Week): 2, Weekly Exercise (Minutes/Week): 40, Intensity: Mild   Goals Addressed             This Visit's Progress    DIET - REDUCE SUGAR INTAKE         Depression Screen    02/25/2023    3:23 PM 02/26/2022    2:09 PM 02/19/2022    3:33 PM 11/17/2020    1:37 PM 08/15/2020    1:37 PM 08/12/2020    3:17 PM 04/01/2020    4:17 PM  PHQ 2/9 Scores  PHQ - 2 Score 0 3 0 0 0 0 0  PHQ- 9 Score 0 7         Fall Risk    02/25/2023    3:27 PM 02/26/2022    2:09 PM 02/19/2022    3:38 PM 02/19/2022    3:35 PM 02/17/2021    1:41 PM  [place] in the past year? 0 0  1 0  Number falls in past yr: 0 0 0 1 0  Injury with Fall? 0 0  0 0  Risk for fall due to : No Fall Risks No Fall Risks  History of fall(s)   Follow up Falls prevention discussed;Falls evaluation completed Falls evaluation completed  Falls prevention discussed     FALL RISK PREVENTION PERTAINING TO THE HOME:  Any stairs in or around the home? No  If so, are there any without handrails? No  Home free of loose throw rugs in walkways, pet beds, electrical cords, etc? Yes  Adequate lighting in your home to reduce risk of falls? Yes   ASSISTIVE DEVICES UTILIZED TO PREVENT FALLS:  Life alert? No  Use of a cane, walker or w/c? Yes -cane Grab bars in the bathroom? Yes  Shower chair or bench in shower? Yes  Elevated toilet seat or a handicapped toilet? Yes   Cognitive Function:        02/25/2023    3:33 PM 08/12/2020    3:22 PM 06/08/2018    9:32 AM 06/07/2017    2:59 PM  6CIT Screen  What Year? 0 points 0 points 4 points 0 points  What month? 0 points 0 points 0 points 0 points  What time? 0 points 0 points 0 points 0 points  Count back from 20 0 points 2 points 0 points 0 points  Months in reverse 2 points 4 points 2 points 4 points  Repeat phrase 2 points 0 points 0 points 0 points  Total Score 4 points 6 points 6 points 4 points     Immunizations Immunization History  Administered Date(s) Administered   Fluad Quad(high Dose 65+) 11/02/2019, 10/16/2020, 12/29/2021, 09/21/2022   Influenza, High Dose Seasonal PF 10/03/2015, 11/03/2016, 10/06/2017, 11/06/2018   PFIZER(Purple Top)SARS-COV-2 Vaccination 01/14/2020, 02/04/2020   Pneumococcal Conjugate-13 12/24/2014   Pneumococcal Polysaccharide-23 12/25/2012   Td 07/02/2015   Zoster, Live 12/25/2012    TDAP status: Up to date  Flu Vaccine status: Up to date  Pneumococcal vaccine status: Declined,  Education has been provided regarding the importance of this vaccine but patient still declined. Advised may receive this vaccine at local pharmacy or Health Dept. Aware to provide a copy of the vaccination record if obtained from local pharmacy or Health Dept. Verbalized acceptance and understanding.   Covid-19 vaccine status: Completed vaccines  Qualifies for Shingles Vaccine? Yes   Zostavax completed Yes   Shingrix Completed?: No.    Education has been  provided regarding the importance of this vaccine. Patient has been advised to call insurance company to determine out of pocket expense if they have not yet received this vaccine. Advised may also receive vaccine at local pharmacy or Health Dept. Verbalized acceptance and understanding.  Screening Tests Health Maintenance  Topic Date Due   Diabetic kidney evaluation - Urine ACR  Never done   Zoster Vaccines- Shingrix (1 of 2) Never done   COVID-19 Vaccine (3 - Pfizer risk series) 03/03/2020   Diabetic kidney evaluation - eGFR measurement  03/09/2022   HEMOGLOBIN A1C  10/13/2022   OPHTHALMOLOGY EXAM  10/29/2022   FOOT EXAM  07/08/2023   Medicare Annual Wellness (AWV)  02/25/2024   DTaP/Tdap/Td (2 - Tdap) 07/01/2025   Pneumonia Vaccine 3+ Years old  Completed   INFLUENZA VACCINE  Completed   HPV VACCINES  Aged Out    Health Maintenance  Health Maintenance Due  Topic Date Due   Diabetic kidney evaluation -  Urine ACR  Never done   Zoster Vaccines- Shingrix (1 of 2) Never done   COVID-19 Vaccine (3 - Pfizer risk series) 03/03/2020   Diabetic kidney evaluation - eGFR measurement  03/09/2022   HEMOGLOBIN A1C  10/13/2022   OPHTHALMOLOGY EXAM  10/29/2022    Colorectal cancer screening: No longer required.   Lung Cancer Screening: (Low Dose CT Chest recommended if Age 31-80 years, 30 pack-year currently smoking OR have quit w/in 15years.) does not qualify.   Additional Screening:  Hepatitis C Screening: does not qualify; Completed no  Vision Screening: Recommended annual ophthalmology exams for early detection of glaucoma and other disorders of the eye. Is the patient up to date with their annual eye exam?  Yes  Who is the provider or what is the name of the office in which the patient attends annual eye exams? [place] If pt is not established with a provider, would they like to be referred to a provider to establish care? No .   Dental Screening: Recommended annual dental exams for proper oral hygiene  Community Resource Referral / Chronic Care Management: CRR required this visit?  No   CCM required this visit?  No      Plan:     I have personally reviewed and noted the following in the patient's chart:   Medical and social history Use of alcohol, tobacco or illicit drugs  Current medications and supplements including opioid prescriptions. Patient is not currently taking opioid prescriptions. Functional ability and status Nutritional status Physical activity Advanced directives List of other physicians Hospitalizations, surgeries, and ER visits in previous 12 months Vitals Screenings to include cognitive, depression, and falls Referrals and appointments  In addition, I have reviewed and discussed with patient certain preventive protocols, quality metrics, and best practice recommendations. A written personalized care plan for preventive services as well as general  preventive health recommendations were provided to patient.     Dionisio David, LPN   [id number]   Nurse Notes: none

## 2023-02-25 NOTE — Patient Instructions (Addendum)
[name] , Thank you for taking time to come for your Medicare Wellness Visit. I appreciate your ongoing commitment to your health goals. Please review the following plan we discussed and let me know if I can assist you in the future.   These are the goals we discussed:  Goals      DIET - EAT MORE FRUITS AND VEGETABLES     DIET - INCREASE WATER INTAKE     Recommend increasing water intake to 6-8 glasses a day.      DIET - REDUCE SUGAR INTAKE     Patient Stated     08/12/2020, no goals     Pharmacy Goals     Our goal A1c is less than 7%. This corresponds with fasting sugars less than 130 and 2 hour after meal sugars less than 180. Please continue to monitor home blood sugar.  Please check your home blood pressure, keep a log of the results and have this record for [place] to review during our next telephone appointment   Feel free to call me with any questions or concerns. I look forward to our next call!   Wallace Cullens, PharmD, Para March, CPP Clinical Pharmacist [facility] [phone]        This is a list of the screening recommended for you and due dates:  Health Maintenance  Topic Date Due   Yearly kidney health urinalysis for diabetes  Never done   Zoster (Shingles) Vaccine (1 of 2) Never done   COVID-19 Vaccine (3 - Pfizer risk series) 03/03/2020   Yearly kidney function blood test for diabetes  03/09/2022   Hemoglobin A1C  10/13/2022   Eye exam for diabetics  10/29/2022   Complete foot exam   07/08/2023   Medicare Annual Wellness Visit  02/25/2024   DTaP/Tdap/Td vaccine (2 - Tdap) 07/01/2025   Pneumonia Vaccine  Completed   Flu Shot  Completed   HPV Vaccine  Aged Out    Advanced directives: no  Conditions/risks identified: none  Next appointment: Follow up in one year for your annual wellness visit. 03/02/24 @ 1:30 pm by phone  Preventive Care 65 Years and Older, Male  Preventive care refers to lifestyle choices and visits with  your health care provider that can promote health and wellness. What does preventive care include? A yearly physical exam. This is also called an annual well check. Dental exams once or twice a year. Routine eye exams. Ask your health care provider how often you should have your eyes checked. Personal lifestyle choices, including: Daily care of your teeth and gums. Regular physical activity. Eating a healthy diet. Avoiding tobacco and drug use. Limiting alcohol use. Practicing safe sex. Taking low doses of aspirin every day. Taking vitamin and mineral supplements as recommended by your health care provider. What happens during an annual well check? The services and screenings done by your health care provider during your annual well check will depend on your age, overall health, lifestyle risk factors, and family history of disease. Counseling  Your health care provider may ask you questions about your: Alcohol use. Tobacco use. Drug use. Emotional well-being. Home and relationship well-being. Sexual activity. Eating habits. History of falls. Memory and ability to understand (cognition). Work and work Statistician. Screening  You may have the following tests or measurements: Height, weight, and BMI. Blood pressure. Lipid and cholesterol levels. These may be checked every 5 years, or more frequently if you are over 65 years old. Skin  check. Lung cancer screening. You may have this screening every year starting at age 20 if you have a 30-pack-year history of smoking and currently smoke or have quit within the past 15 years. Fecal occult blood test (FOBT) of the stool. You may have this test every year starting at age 50. Flexible sigmoidoscopy or colonoscopy. You may have a sigmoidoscopy every 5 years or a colonoscopy every 10 years starting at age 49. Prostate cancer screening. Recommendations will vary depending on your family history and other risks. Hepatitis C blood  test. Hepatitis B blood test. Sexually transmitted disease (STD) testing. Diabetes screening. This is done by checking your blood sugar (glucose) after you have not eaten for a while (fasting). You may have this done every 1-3 years. Abdominal aortic aneurysm (AAA) screening. You may need this if you are a current or former smoker. Osteoporosis. You may be screened starting at age 20 if you are at high risk. Talk with your health care provider about your test results, treatment options, and if necessary, the need for more tests. Vaccines  Your health care provider may recommend certain vaccines, such as: Influenza vaccine. This is recommended every year. Tetanus, diphtheria, and acellular pertussis (Tdap, Td) vaccine. You may need a Td booster every 10 years. Zoster vaccine. You may need this after age 11. Pneumococcal 13-valent conjugate (PCV13) vaccine. One dose is recommended after age 3. Pneumococcal polysaccharide (PPSV23) vaccine. One dose is recommended after age 65. Talk to your health care provider about which screenings and vaccines you need and how often you need them. This information is not intended to replace advice given to you by your health care provider. Make sure you discuss any questions you have with your health care provider. Document Released: 01/02/2016 Document Revised: 08/25/2016 Document Reviewed: 10/07/2015 Elsevier Interactive Patient Education  2017 [place] Prevention in the Home Falls can cause injuries. They can happen to people of all ages. There are many things you can do to make your home safe and to help prevent falls. What can I do on the outside of my home? Regularly fix the edges of walkways and driveways and fix any cracks. Remove anything that might make you trip as you walk through a door, such as a raised step or threshold. Trim any bushes or trees on the path to your home. Use bright outdoor lighting. Clear any walking paths of  anything that might make someone trip, such as rocks or tools. Regularly check to see if handrails are loose or broken. Make sure that both sides of any steps have handrails. Any raised decks and porches should have guardrails on the edges. Have any leaves, snow, or ice cleared regularly. Use sand or salt on walking paths during winter. Clean up any spills in your garage right away. This includes oil or grease spills. What can I do in the bathroom? Use night lights. Install grab bars by the toilet and in the tub and shower. Do not use towel bars as grab bars. Use non-skid mats or decals in the tub or shower. If you need to sit down in the shower, use a plastic, non-slip stool. Keep the floor dry. Clean up any water that spills on the floor as soon as it happens. Remove soap buildup in the tub or shower regularly. Attach bath mats securely with double-sided non-slip rug tape. Do not have throw rugs and other things on the floor that can make you trip. What can I do in the  bedroom? Use night lights. Make sure that you have a light by your bed that is easy to reach. Do not use any sheets or blankets that are too big for your bed. They should not hang down onto the floor. Have a firm chair that has side arms. You can use this for support while you get dressed. Do not have throw rugs and other things on the floor that can make you trip. What can I do in the kitchen? Clean up any spills right away. Avoid walking on wet floors. Keep items that you use a lot in easy-to-reach places. If you need to reach something above you, use a strong step stool that has a grab bar. Keep electrical cords out of the way. Do not use floor polish or wax that makes floors slippery. If you must use wax, use non-skid floor wax. Do not have throw rugs and other things on the floor that can make you trip. What can I do with my stairs? Do not leave any items on the stairs. Make sure that there are handrails on both  sides of the stairs and use them. Fix handrails that are broken or loose. Make sure that handrails are as long as the stairways. Check any carpeting to make sure that it is firmly attached to the stairs. Fix any carpet that is loose or worn. Avoid having throw rugs at the top or bottom of the stairs. If you do have throw rugs, attach them to the floor with carpet tape. Make sure that you have a light switch at the top of the stairs and the bottom of the stairs. If you do not have them, ask someone to add them for you. What else can I do to help prevent falls? Wear shoes that: Do not have high heels. Have rubber bottoms. Are comfortable and fit you well. Are closed at the toe. Do not wear sandals. If you use a stepladder: Make sure that it is fully opened. Do not climb a closed stepladder. Make sure that both sides of the stepladder are locked into place. Ask someone to hold it for you, if possible. Clearly mark and make sure that you can see: Any grab bars or handrails. First and last steps. Where the edge of each step is. Use tools that help you move around (mobility aids) if they are needed. These include: Canes. Walkers. Scooters. Crutches. Turn on the lights when you go into a dark area. Replace any light bulbs as soon as they burn out. Set up your furniture so you have a clear path. Avoid moving your furniture around. If any of your floors are uneven, fix them. If there are any pets around you, be aware of where they are. Review your medicines with your doctor. Some medicines can make you feel dizzy. This can increase your chance of falling. Ask your doctor what other things that you can do to help prevent falls. This information is not intended to replace advice given to you by your health care provider. Make sure you discuss any questions you have with your health care provider. Document Released: 10/02/2009 Document Revised: 05/13/2016 Document Reviewed: 01/10/2015 Elsevier  Interactive Patient Education  2017 [employer].

## 2023-03-28 ENCOUNTER — Other Ambulatory Visit: Payer: Self-pay | Admitting: Family Medicine

## 2023-03-28 DIAGNOSIS — F411 Generalized anxiety disorder: Secondary | ICD-10-CM

## 2023-03-28 DIAGNOSIS — F458 Other somatoform disorders: Secondary | ICD-10-CM

## 2023-03-28 NOTE — Telephone Encounter (Signed)
Medication Refill - Medication: ALPRAZolam (XANAX) 0.5 MG tablet   Has the patient contacted their pharmacy? No. (Agent: If no, request that the patient contact the pharmacy for the refill. If patient does not wish to contact the pharmacy document the reason why and proceed with request.) (Agent: If yes, when and what did the pharmacy advise?)  Preferred Pharmacy (with phone number or street name):  [id number] [place], [place] - [address] Phone: [phone]  Fax: [phone]     Has the patient been seen for an appointment in the last year OR does the patient have an upcoming appointment? Yes.    Agent: Please be advised that RX refills may take up to 3 business days. We ask that you follow-up with your pharmacy.

## 2023-03-29 MED ORDER — ALPRAZOLAM 0.5 MG PO TABS: 0.5000 mg | ORAL_TABLET | Freq: Two times a day (BID) | ORAL | 1 refills | Status: DC | PRN

## 2023-03-29 NOTE — Telephone Encounter (Signed)
Requested medication (s) are due for refill today - yes  Requested medication (s) are on the active medication list -yes  Future visit scheduled -no  Last refill: 07/30/22 #30 1RF  Notes to clinic: non delegated Rx  Requested Prescriptions  Pending Prescriptions Disp Refills   ALPRAZolam (XANAX) 0.5 MG tablet 30 tablet 1    Sig: Take 1 tablet (0.5 mg total) by mouth 2 (two) times daily as needed for anxiety. May start with half pill for 0.25mg  dose. May increase in future if needed.     Not Delegated - Psychiatry: Anxiolytics/Hypnotics 2 Failed - 03/28/2023  4:26 PM      Failed - This refill cannot be delegated      Failed - Urine Drug Screen completed in last 360 days      Passed - Patient is not pregnant      Passed - Valid encounter within last 6 months    Recent Outpatient Visits           2 months ago Type 2 diabetes mellitus with diabetic nephropathy, with long-term current use of insulin (HCC)   [facility] Delles, Gentry Fitz A, RPH-CPP   2 months ago Type 2 diabetes mellitus with diabetic nephropathy, with long-term current use of insulin (HCC)   [facility] Delles, Gentry Fitz A, RPH-CPP   4 months ago Type 2 diabetes mellitus with diabetic nephropathy, with long-term current use of insulin (HCC)   [facility] Delles, Gentry Fitz A, RPH-CPP   5 months ago Type 2 diabetes mellitus with diabetic nephropathy, with long-term current use of insulin (HCC)   [facility] Delles, Gentry Fitz A, RPH-CPP   8 months ago GAD (generalized anxiety disorder)   [facility] Smitty Cords, DO       Future Appointments             In 4 months Deirdre Evener, MD [facility]               Requested Prescriptions  Pending Prescriptions Disp Refills   ALPRAZolam (XANAX) 0.5 MG tablet 30 tablet 1    Sig: Take 1  tablet (0.5 mg total) by mouth 2 (two) times daily as needed for anxiety. May start with half pill for 0.25mg  dose. May increase in future if needed.     Not Delegated - Psychiatry: Anxiolytics/Hypnotics 2 Failed - 03/28/2023  4:26 PM      Failed - This refill cannot be delegated      Failed - Urine Drug Screen completed in last 360 days      Passed - Patient is not pregnant      Passed - Valid encounter within last 6 months    Recent Outpatient Visits           2 months ago Type 2 diabetes mellitus with diabetic nephropathy, with long-term current use of insulin (HCC)   [facility] Delles, Gentry Fitz A, RPH-CPP   2 months ago Type 2 diabetes mellitus with diabetic nephropathy, with long-term current use of insulin (HCC)   [facility] Delles, Gentry Fitz A, RPH-CPP   4 months ago Type 2 diabetes mellitus with diabetic nephropathy, with long-term current use of insulin [facility])   [facility] Delles, Gentry Fitz A, RPH-CPP   5 months ago Type 2 diabetes mellitus with diabetic  nephropathy, with long-term current use of insulin [facility])   [facility] Delles, Gentry Fitz A, RPH-CPP   8 months ago GAD (generalized anxiety disorder)   [facility] Smitty Cords, DO       Future Appointments             In 4 months Gwen Pounds Dineen Kid, MD [facility]

## 2023-04-11 ENCOUNTER — Ambulatory Visit: Payer: Medicare HMO | Admitting: Pharmacist

## 2023-04-11 DIAGNOSIS — I129 Hypertensive chronic kidney disease with stage 1 through stage 4 chronic kidney disease, or unspecified chronic kidney disease: Secondary | ICD-10-CM

## 2023-04-11 DIAGNOSIS — E1121 Type 2 diabetes mellitus with diabetic nephropathy: Secondary | ICD-10-CM

## 2023-04-11 NOTE — Progress Notes (Signed)
04/11/2023 Name: Stephen White MRN: [id number] DOB: 1936/11/24  Chief Complaint  Patient presents with   Medication Management   Medication Assistance    Stephen White is a 87 y.o. year old male who presented for a telephone visit.   They were referred to the pharmacist by their PCP for assistance in managing diabetes, hypertension, hyperlipidemia, and medication access.    Subjective:  Today reports continues to have trouble with fast breathing on and off, but that the 1/2 tablet (0.25 mg) of the alprazolam 0.5 mg tablets prescribed to him by his PCP helps. Again discuss using caution with use of alprazolam. Remind patient sit down or lay down each time after taking alprazolam to reduce risk of fall   Care Team: Primary Care Provider: Smitty Cords, DO Cardiologist: Marcina Millard, MD Endocrinologist [place]using, [place]; Next Scheduled Visit: 05/12/2023 Pulmonologist: Julio Sicks, NP Nephrologist: Mosetta Pigeon, MD; Next Scheduled Visit: 07/07/2023 Dermatologist: Deirdre Evener, MD    Medication Access/Adherence  [facility]acy:  [id number] [place], [place] - [address] [place] [address] Phone: [phone] Fax: [phone]  [facility] [address] [place] - [address] [place] [address] Phone: [phone] Fax: [phone]  [facility] [phone] [place], [place] - [address] [place] [address] Phone: [phone] Fax: [phone]  [facility] [place], [place] - [address] [place] [address] Phone: [phone] Fax: [phone]  [id number] [place], [place] - [address] AT [facility] [address] [facility] [address] [place] [address] Phone: [phone] Fax: [phone]   Patient reports affordability concerns with their medications: No  Patient reports  access/transportation concerns to their pharmacy: No  Patient reports adherence concerns with their medications:  No      Diabetes:   Patient followed by [facility] - Note patient seen for Office Visit with [facility]ology on 02/09/2023, Provider advised patient: "Once you finish your supply of Levemir, start taking TRESIBA 18 units at night. Let me know if you have any low sugars (under 70) once you switch to [name] and we will cut back the dose.   Keep taking Novolog before your meals.  If your sugar before your meal is under 100, SKIP the Novolog and just eat your meal.  If your sugar before your meal is between 100 - 200, then take 32 units Novolog before the meal.  If your sugar before your meal is over 200, then take 36 units of Novolog before your meal"  Current medications:  Tresiba 18 units nightly NovoLog 32 - 36 units with meals per Endocrinologist Reports following latest sliding scale direction from Endocrinologist (see above)   Patient using FreeStyle Libre 2 continuous glucose monitor (CGM) Orders sensor refills from [employer] Target goal range: 80-180 mg/dL Alarms set to go off for readings outside of range: 70-240 mg/dL  Reports during our conversation today reading is 104  Denies symptoms of hypoglycemia, such as shaking, sweating or dizziness   Current medication access support: Patient enrolled in [name] patient assistance program for [name] and Novolog through 12/20/2023   Hypertension:  Current medications: losartan 50 mg daily; HCTZ 25 mg daily  Patient has a validated, automated, upper arm home BP cuff Reports checking home blood pressure, but does not  recall reading  Patient denies hypotensive s/sx including dizziness, lightheadedness.      Objective:  Lab Results  Component Value Date   HGBA1C 8.7 04/13/2022    Lab Results  Component Value Date   CREATININE 2.31 (H) 03/09/2021   BUN 49 (H)  03/09/2021   NA 137 03/09/2021   K 4.1 03/09/2021   CL 103 03/09/2021   CO2 26 03/09/2021    Lab Results  Component Value Date   CHOL 152 08/01/2020   HDL 56 08/01/2020   LDLCALC 82 08/01/2020   TRIG 68 08/01/2020   CHOLHDL 2.7 08/01/2020   BP Readings from Last 3 Encounters:  08/16/22 120/64  07/07/22 (!) 140/56  06/08/22 118/80   Pulse Readings from Last 3 Encounters:  08/16/22 68  07/07/22 70  06/08/22 66     Medications Reviewed Today     Reviewed by Hal Hope, LPN (Licensed Practical Nurse) on 02/25/23 at 1541  Med List Status: <None>   Medication Order Taking? Sig Documenting Provider Last Dose Status Informant  ALPRAZolam (XANAX) 0.5 MG tablet [id number] Yes Take 1 tablet (0.5 mg total) by mouth 2 (two) times daily as needed for anxiety. May start with half pill for 0.25mg  dose. May increase in future if needed. Smitty Cords, DO Taking Active   aspirin 81 MG EC tablet [id number] Yes Take 81 mg by mouth daily [provider] Taking Active   Blood Glucose Monitoring Suppl (ONETOUCH VERIO FLEX SYSTEM) w/Device KIT [id number] Yes Use to check blood sugar up to twice daily as directed Smitty Cords, DO Taking Active   Carboxymethylcellulose Sodium (EYE DROPS OP) [id number] Yes Apply to eye. [provider] Taking Active   Continuous Blood Gluc Receiver (FREESTYLE LIBRE 2 READER) DEVI [id number] Yes by Does not apply route. [provider] Taking Active   Continuous Blood Gluc Sensor (FREESTYLE LIBRE 14 DAY SENSOR) [place] [id number] Yes 1 kit. [provider] Taking Active   famotidine (PEPCID) 10 MG tablet [id number] Yes Take 1 tablet (10 mg total) by mouth daily.  Patient taking differently: Take 10 mg by mouth every other day. As needed   Smitty Cords, DO Taking Active   gabapentin (NEURONTIN) 300 MG capsule [id number] Yes Take 300 mg by mouth as needed. [provider] Taking Active   glucose  blood (ONETOUCH VERIO) test strip [id number] Yes Use to check blood sugar twice daily Smitty Cords, DO Taking Active   hydrochlorothiazide (HYDRODIURIL) 25 MG tablet [id number] Yes TAKE 1 TABLET(25 MG) BY MOUTH DAILY Althea Charon, Netta Neat, DO Taking Active   insulin aspart (NOVOLOG) 100 UNIT/ML FlexPen [id number] Yes Take NovoLog 32-35 units with meals following direction from Endocrinologist [provider] Taking Active   insulin detemir (LEVEMIR) 100 UNIT/ML FlexPen [id number] Yes Inject 18 Units into the skin daily. [provider] Taking Active   Lancets Letta Pate [place] PLUS [place]) MISC [id number] Yes Use to check blood sugar twice daily Smitty Cords, DO Taking Active   levalbuterol [facility] HFA) 45 MCG/ACT inhaler [id number] Yes Inhale 1 puff into the lungs every 6 (six) hours as needed for wheezing. Parrett, Virgel Bouquet, NP Taking Active   losartan (COZAAR) 50 MG tablet [id number] Yes Take 1 tablet (50 mg total) by mouth daily. Smitty Cords, DO Taking Active   Multiple Vitamins-Minerals [facility] COMPLETE SENIOR MULTIVITS) TABS [id number] Yes Take by mouth. [provider] Taking Active   SALINE MIST SPRAY NA [id number] Yes Place into the nose. [provider] Taking Active   vitamin B-12 (CYANOCOBALAMIN) 500 MCG tablet [id number] Yes Take 500 mcg by mouth daily. [provider] Taking Active               Assessment/Plan:   Again discuss using caution with use of alprazolam. Remind patient sit down or lay down each time after taking alprazolam to reduce risk of fall   Diabetes: - Counsel patient on importance of taking insulin regimen as directed by Endocrinologist and calling Endocrinology as needed for concerns related to his blood sugar  - Have counseled on s/s of low blood sugar, prevention of lows and how to manage lows Patient using Freestyle Libre 2 monitor for low blood sugar alarm Patient carries glucose  tablets to have in case of hypoglycemia - Encourage patient to continue to use Freestyle Libre CGM     Hypertension: - Reviewed long term cardiovascular and renal outcomes of uncontrolled blood pressure - Reviewed appropriate blood pressure monitoring technique and reviewed goal blood pressure. Recommended to check home blood pressure and heart rate, keep log of results, bring this record with him to medical appointments, but to call office sooner if needed for readings outside of established parameters    Follow Up Plan: Clinical Pharmacist will follow up with patient by telephone on 10/19/2023 at 1 pm   Estelle Grumbles, PharmD, P[place]y [place], CPP Clinical Pharmacist [facility] [phone]

## 2023-04-11 NOTE — Patient Instructions (Signed)
Goals Addressed             This Visit's Progress    Pharmacy Goals       Our goal A1c is less than 7%. This corresponds with fasting sugars less than 130 and 2 hour after meal sugars less than 180. Please continue to monitor home blood sugar.  Please check your home blood pressure, keep a log of the results and have this record for us to review during our next telephone appointment   Feel free to call me with any questions or concerns. I look forward to our next call!  Raffaella Edison Rendon Howell, PharmD, BCACP, CPP Clinical Pharmacist [facility] [phone]        

## 2023-04-18 IMAGING — US US RENAL
1 series · 15 of 25 positions shown · non-contrast
Comparison: None.

CLINICAL DATA: Chronic kidney disease

EXAM:
RENAL / URINARY TRACT ULTRASOUND COMPLETE

[Series 1: us renal · 15 of 37 slices shown]
[im 1/37]
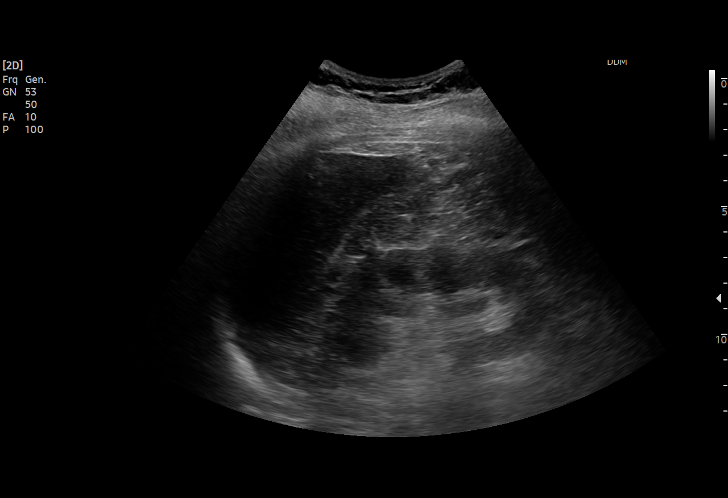
[im 4/37]
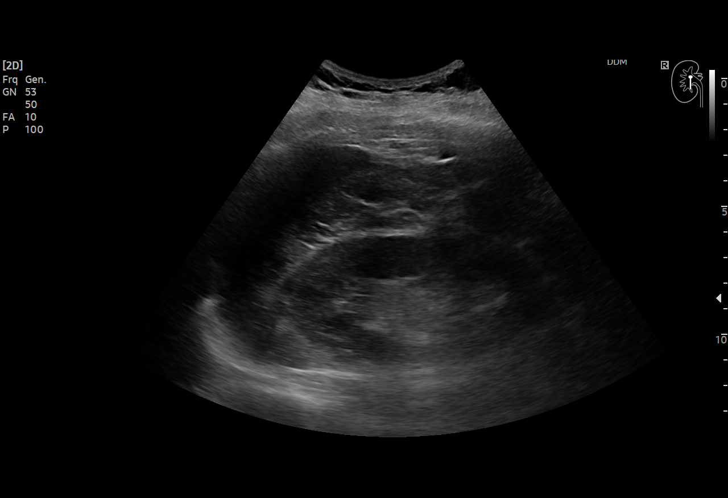
[im 7/37]
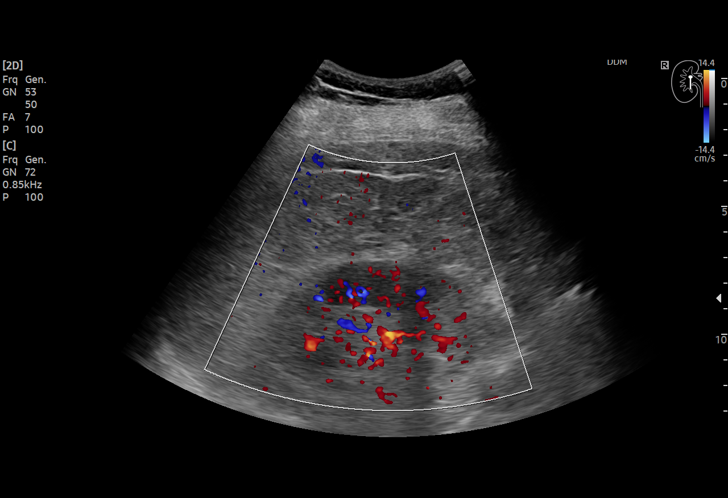
[im 8/37]
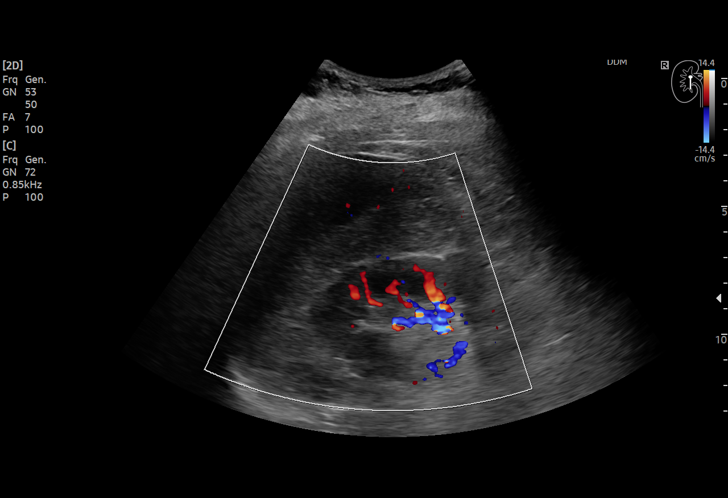
[im 11/37]
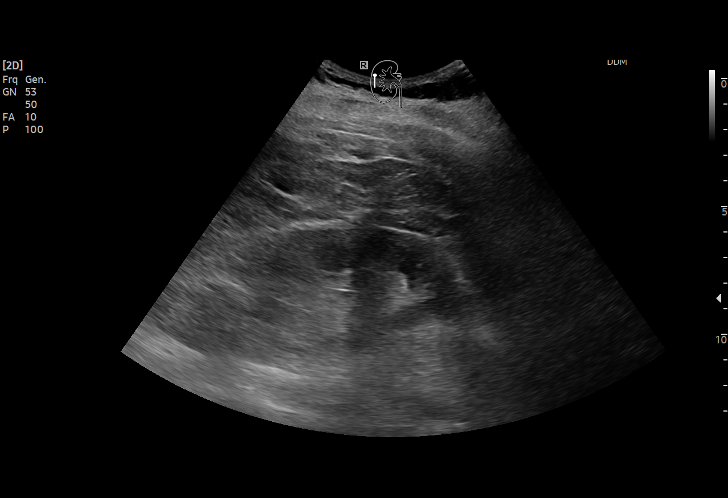
[im 14/37]
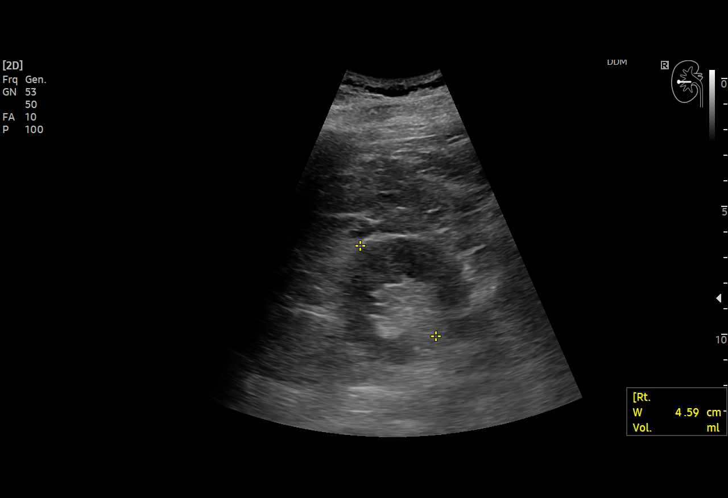
[im 16/37]
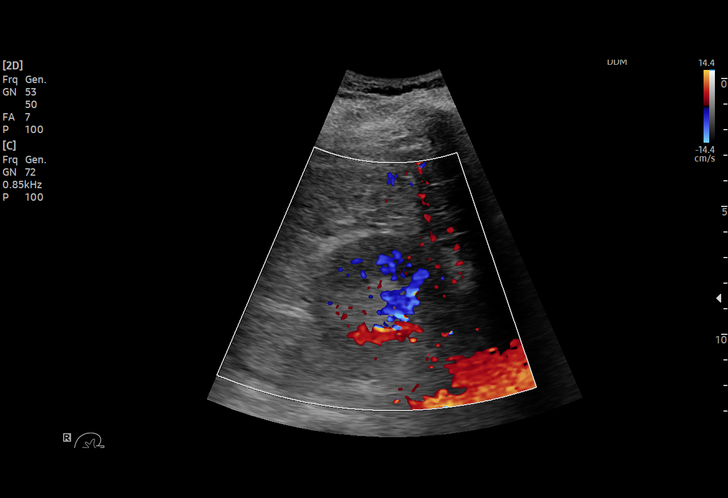
[im 19/37]
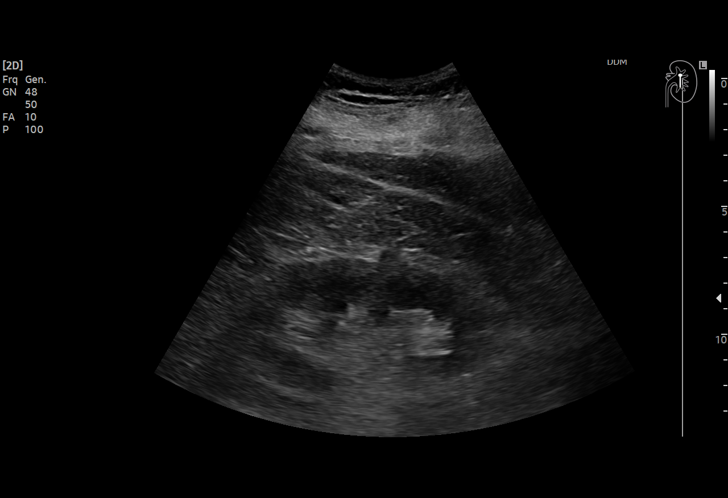
[im 22/37]
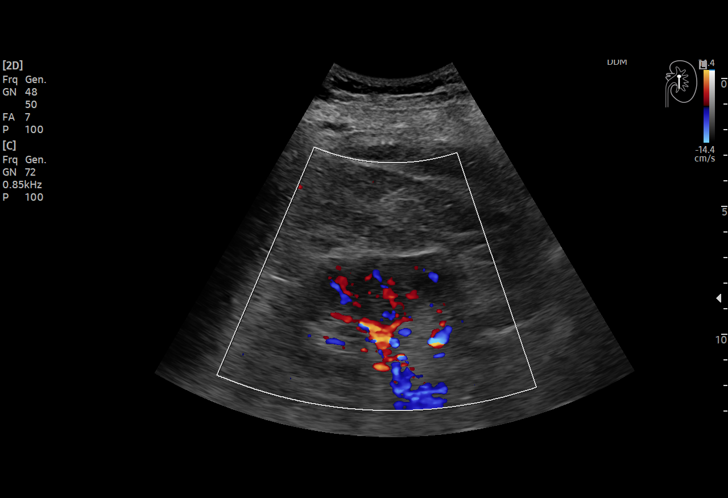
[im 23/37]
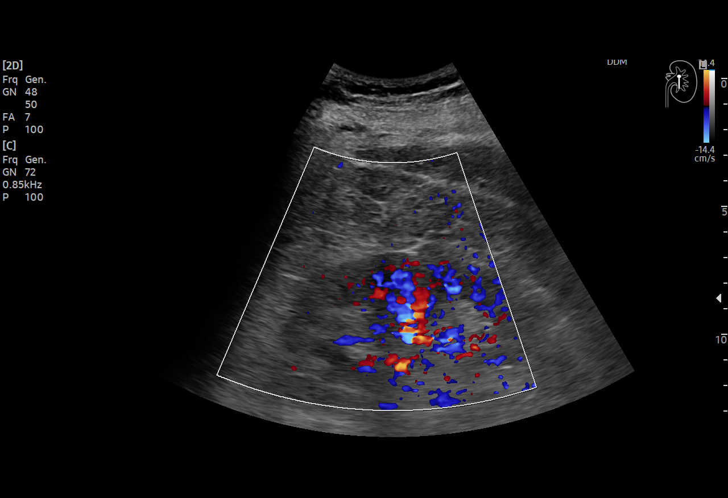
[im 26/37]
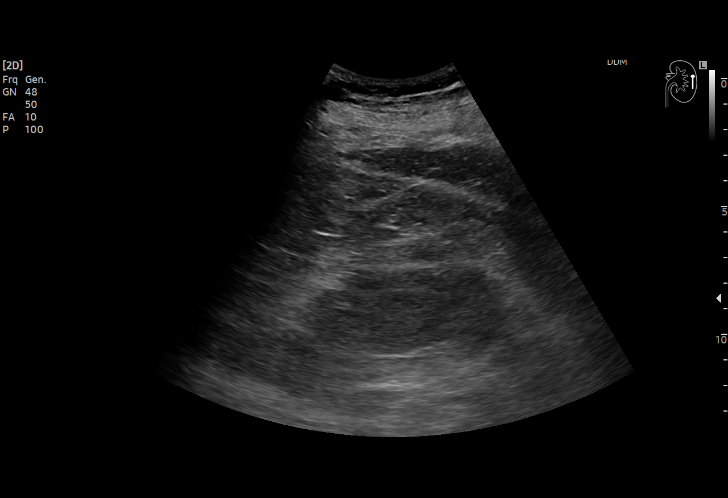
[im 29/37]
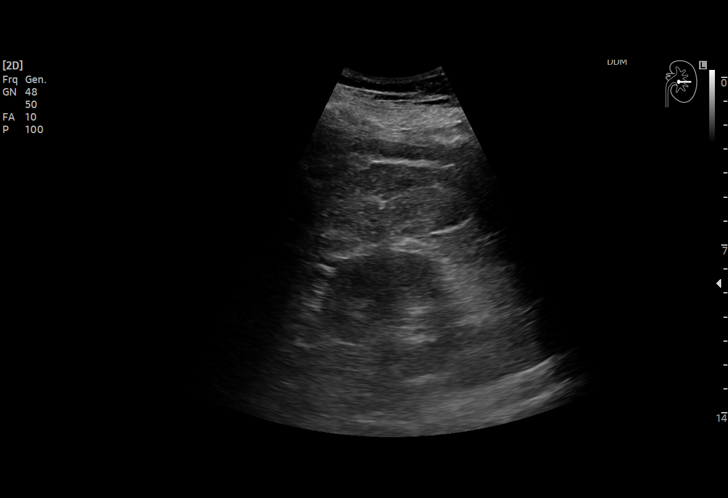
[im 31/37]
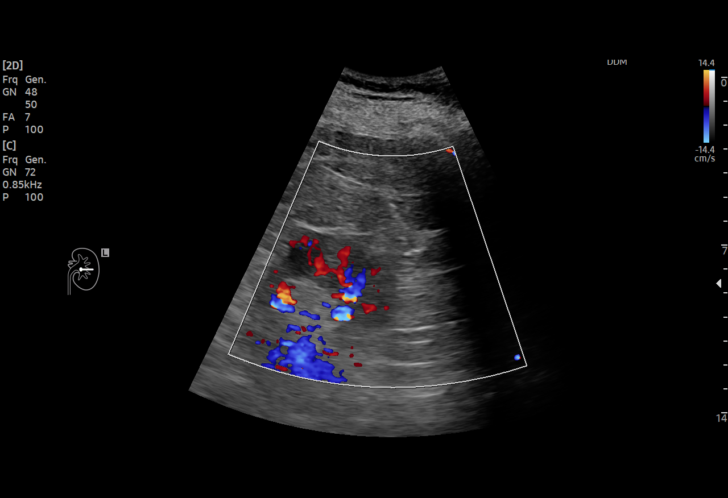
[im 34/37]
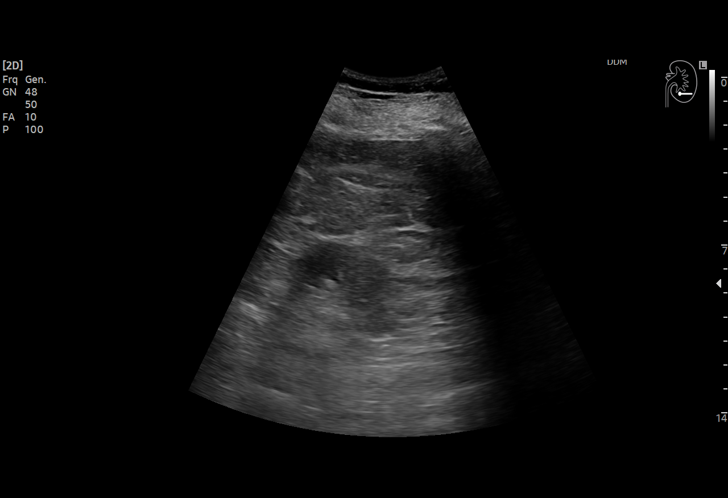
[im 37/37]
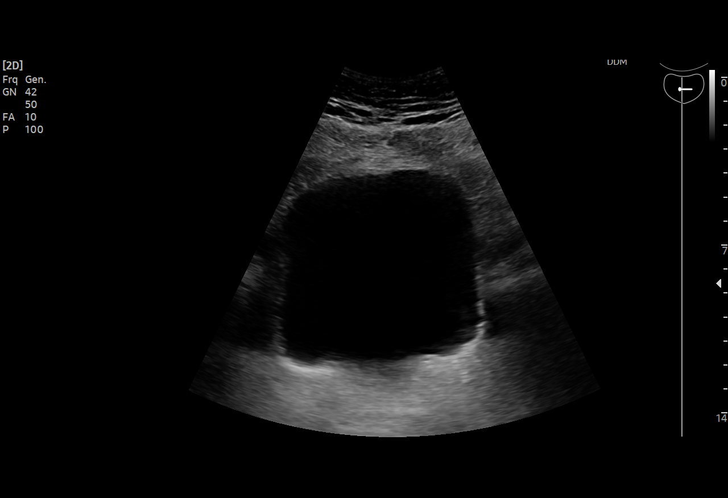

[15 of 25 positions shown; findings below may reference images not displayed]

FINDINGS: Right Kidney:

Renal measurements: 10.0 x 4.7 x 4.6 cm = volume: 113 mL. Increased
renal cortical echogenicity. No mass, shadowing stone, or
hydronephrosis visualized.

Left Kidney:

Renal measurements: 10.5 x 4.7 x 6.0 cm = volume: 155 mL. Increased
renal cortical echogenicity. No mass, shadowing stone, or
hydronephrosis visualized.

Bladder:

Appears normal for degree of bladder distention.

Other:

None.
IMPRESSION: 1. No evidence of obstructive uropathy.
2. Increased bilateral renal cortical echogenicity suggesting
medical renal disease.

## 2023-04-29 ENCOUNTER — Encounter: Payer: Self-pay | Admitting: Family Medicine

## 2023-04-29 ENCOUNTER — Ambulatory Visit: Payer: Medicare HMO | Admitting: Family Medicine

## 2023-04-29 VITALS — BP 126/62 | HR 68 | Temp 96.2°F | Wt 184.0 lb

## 2023-04-29 DIAGNOSIS — Z794 Long term (current) use of insulin: Secondary | ICD-10-CM

## 2023-04-29 DIAGNOSIS — E160 Drug-induced hypoglycemia without coma: Secondary | ICD-10-CM | POA: Diagnosis not present

## 2023-04-29 DIAGNOSIS — I129 Hypertensive chronic kidney disease with stage 1 through stage 4 chronic kidney disease, or unspecified chronic kidney disease: Secondary | ICD-10-CM | POA: Diagnosis not present

## 2023-04-29 DIAGNOSIS — E785 Hyperlipidemia, unspecified: Secondary | ICD-10-CM

## 2023-04-29 DIAGNOSIS — R42 Dizziness and giddiness: Secondary | ICD-10-CM

## 2023-04-29 DIAGNOSIS — E1121 Type 2 diabetes mellitus with diabetic nephropathy: Secondary | ICD-10-CM | POA: Diagnosis not present

## 2023-04-29 DIAGNOSIS — E559 Vitamin D deficiency, unspecified: Secondary | ICD-10-CM

## 2023-04-29 DIAGNOSIS — E1169 Type 2 diabetes mellitus with other specified complication: Secondary | ICD-10-CM

## 2023-04-29 DIAGNOSIS — E538 Deficiency of other specified B group vitamins: Secondary | ICD-10-CM

## 2023-04-29 DIAGNOSIS — N184 Chronic kidney disease, stage 4 (severe): Secondary | ICD-10-CM

## 2023-04-29 MED ORDER — GVOKE HYPOPEN 2-PACK 1 MG/0.2ML ~~LOC~~ SOAJ
1.0000 mg | SUBCUTANEOUS | 2 refills | Status: AC | PRN
Start: 2023-04-29 — End: ?

## 2023-04-29 NOTE — Patient Instructions (Addendum)
Thank you for coming to the office today.  Lab today for Vitamin Deficiency check.  ---------------------  Episodes likely with drop in blood pressure from standing and activity.  Goal to limit some of the BP medications.  1st - REDUCE Hydrochlorothiazide 25mg  tablet in HALF - take half pill every day instead of whole pill.  This will hopefully reduce the bathroom trips and help maintain your BP  If you do not notice enough improvement, then the next step - you can STOP it completely and HOLD off of this med.  Next  The low Hypoglycemia readings is also a culprit for your symptoms  I recommend the following change:  Morning dose Novolog meal time insulin  HOLD or SKIP if < 100 Take reduced dose 25 units if >100-200 Take reduced dose of 30 units if >200  Please review with your Endocrinologist upcoming at next apt.  Use GVOKE Pen if you run into low sugars 54 or less. Use this instead of eating a bunch immediately.  ----------   Please schedule a Follow-up Appointment to: Return if symptoms worsen or fail to improve.  If you have any other questions or concerns, please feel free to call the office or send a message through MyChart. You may also schedule an earlier appointment if necessary.  Additionally, you may be receiving a survey about your experience at our office within a few days to 1 week by e-mail or mail. We value your feedback.  Saralyn Pilar, DO [facility], [place]

## 2023-04-29 NOTE — Progress Notes (Signed)
Subjective:    Patient ID: Stephen White, male    DOB: Jan 11, 1936, 87 y.o.   MRN: [id number]  Stephen White is a 87 y.o. male presenting on 04/29/2023 for Dizziness (Lightheaded, weak.  This has been going on for several months. Pt denies increased shortness of breath.  Pt states it actually seems better. /)  Here with wife Elease Hashimoto  HPI  Dizziness / Lightheadedness episodes Near Syncopal Episodes  Reports episodic symptoms, sporadic, not every day. But occurring enough to cause them concern. Question if medicines are directly related seems to occur after taking medicines in morning  He has CGM Freestyle and monitoring his results, usually avg 170+ high 300s, low 50s. After Insulin in AM, has had 5 events of hypoglycemia in the past 30 days, 1 in past 7 days.  He is followed by [facility] Endocrinology. Next apt in 13 days. 05/12/23  He is following current instructions  Insulin 3 times daily with meal - if < 100, do not take insulin, if 100-200, take 32 units and if >200 take 36 units.  Usually smaller amount of food in afternoon, he drops low in afternoon  Larger dose PM with supper - Tresiba 18u nightly.  Generalized Anxiety Disorder Hyperventilation / Dyspnea Followed by Pulmonology has had extensive testing, PFT, Imaging even recent work up for Neurogenic Hyperventilation and results of MR brain were negative, has had testing with Overnight Oximetry normal range not indicated for O2. On Xopenex PRN some relief. But has worse tremors.  He is also followed by Nephrology   Denies any fevers, chills, sweats, body ache, cough, shortness of breath, sinus pain or pressure, headache, abdominal pain, diarrhea       04/29/2023    9:54 AM 02/25/2023    3:23 PM 02/26/2022    2:09 PM  Depression screen PHQ 2/9  Decreased Interest 0 0 3  Down, Depressed, Hopeless 0 0 0  PHQ - 2 Score 0 0 3  Altered sleeping 2 0 2  Tired, decreased energy 3 0 2  Change in appetite 0 0 0   Feeling bad or failure about yourself  0 0 0  Trouble concentrating 2 0 0  Moving slowly or fidgety/restless 2 0 0  Suicidal thoughts 0 0 0  PHQ-9 Score 9 0 7  Difficult doing work/chores Not difficult at all Not difficult at all Not difficult at all    Social History   Tobacco Use   Smoking status: Former    Packs/day: 0.30    Years: 25.00    Additional pack years: 0.00    Total pack years: 7.50    Types: Cigarettes    Quit date: 12/19/1978    Years since quitting: 44.3   Smokeless tobacco: Former  Building services engineer Use: Never used  Substance Use Topics   Alcohol use: Yes    Alcohol/week: 0.0 standard drinks of alcohol   Drug use: No    Review of Systems Per HPI unless specifically indicated above     Objective:    BP 126/62 (BP Location: Left Arm, Patient Position: Sitting, Cuff Size: Normal)   Pulse 68   Temp (!) 96.2 F (35.7 C) (Temporal)   Wt 184 lb (83.5 kg)   SpO2 97%   BMI 29.70 kg/m   Wt Readings from Last 3 Encounters:  04/29/23 184 lb (83.5 kg)  02/25/23 184 lb (83.5 kg)  08/16/22 184 lb (83.5 kg)    Physical Exam Vitals and  nursing note reviewed.  Constitutional:      General: He is not in acute distress.    Appearance: He is well-developed. He is not diaphoretic.     Comments: Well-appearing, comfortable, cooperative  HENT:     Head: Normocephalic and atraumatic.  Eyes:     General:        Right eye: No discharge.        Left eye: No discharge.     Conjunctiva/sclera: Conjunctivae normal.     Pupils: Pupils are equal, round, and reactive to light.  Neck:     Thyroid: No thyromegaly.  Cardiovascular:     Rate and Rhythm: Normal rate and regular rhythm.     Pulses: Normal pulses.     Heart sounds: Normal heart sounds. No murmur heard. Pulmonary:     Effort: No respiratory distress.     Breath sounds: Normal breath sounds. No wheezing or rales.     Comments: Hyperventilation and dyspnea. Impacts his talking, causes him to pause or  stutter Abdominal:     General: Bowel sounds are normal. There is no distension.     Palpations: Abdomen is soft. There is no mass.     Tenderness: There is no abdominal tenderness.  Musculoskeletal:        General: No tenderness. Normal range of motion.     Cervical back: Normal range of motion and neck supple.     Comments: Upper / Lower Extremities: - Normal muscle tone, strength bilateral upper extremities 5/5, lower extremities 5/5  Lymphadenopathy:     Cervical: No cervical adenopathy.  Skin:    General: Skin is warm and dry.     Findings: No erythema or rash.  Neurological:     Mental Status: He is alert and oriented to person, place, and time.     Comments: Distal sensation intact to light touch all extremities  Psychiatric:        Mood and Affect: Mood normal.        Behavior: Behavior normal.        Thought Content: Thought content normal.     Comments: Well groomed, good eye contact, normal speech and thoughts      Results for orders placed or performed in visit on 07/07/22  Hemoglobin A1c  Result Value Ref Range   Hemoglobin A1C 8.7       Assessment & Plan:   Problem List Items Addressed This Visit     Benign hypertension with CKD (chronic kidney disease) stage IV (HCC)   Relevant Medications   hydrochlorothiazide (HYDRODIURIL) 25 MG tablet   Type 2 diabetes mellitus with diabetic nephropathy, with long-term current use of insulin (HCC) - Primary   Relevant Medications   GVOKE HYPOPEN 2-PACK 1 MG/0.2ML SOAJ   Other Visit Diagnoses     Orthostatic dizziness       Hypoglycemia due to insulin       Relevant Medications   GVOKE HYPOPEN 2-PACK 1 MG/0.2ML SOAJ   Vitamin B12 nutritional deficiency       Relevant Orders   Vitamin B12   Vitamin D deficiency       Relevant Orders   VITAMIN D 25 Hydroxy (Vit-D Deficiency, Fractures)   Hyperlipidemia associated with type 2 diabetes mellitus (HCC)       Relevant Medications   hydrochlorothiazide (HYDRODIURIL)  25 MG tablet   GVOKE HYPOPEN 2-PACK 1 MG/0.2ML SOAJ   Other Relevant Orders   TSH   T4, free  Low Energy Fatigue / Concern Vitamin Deficiency from previous lab review.  Check thyroid, vitamins D B12 today pending result.  ---------------------  Orthostatic Dizziness / Near Syncope No recorded Syncopal events Episodes likely with drop in blood pressure from standing and activity. Goal to limit some of the BP medications.  1st - REDUCE Hydrochlorothiazide 25mg  tablet in HALF - take half pill every day instead of whole pill. This will hopefully reduce the bathroom trips and help maintain your BP If you do not notice enough improvement, then the next step - you can STOP it completely and HOLD off of this med.  Next  The low Hypoglycemia readings is also a culprit for your symptoms I recommend the following change:  Morning dose Novolog meal time insulin  HOLD or SKIP if < 100 Take reduced dose 25 units if >100-200 Take reduced dose of 30 units if >200  Please review with your Endocrinologist upcoming at next apt. 5./23.24  Use GVOKE Pen if you run into low sugars 54 or less. Use this instead of eating a bunch immediately.  Strict return precautions if severe episodes or worsening, when to seek care hospital ED if passing out or complications from current symptoms.  Meds ordered this encounter  Medications   GVOKE HYPOPEN 2-PACK 1 MG/0.2ML SOAJ    Sig: Inject 1 mg into the skin as needed (hypoglycemia).    Dispense:  1 mL    Refill:  2      Follow up plan: Return if symptoms worsen or fail to improve.   Saralyn Pilar, DO [name] 04/29/2023, 9:48 AM

## 2023-04-30 LAB — VITAMIN B12: Vitamin B-12: >2000 pg/mL — ABNORMAL HIGH (ref 200–1100)

## 2023-04-30 LAB — VITAMIN D 25 HYDROXY (VIT D DEFICIENCY, FRACTURES): Vit D, 25-Hydroxy: 30 ng/mL (ref 30–100)

## 2023-04-30 LAB — TSH: TSH: 2.91 mIU/L (ref 0.40–4.50)

## 2023-04-30 LAB — T4, FREE: Free T4: 1 ng/dL (ref 0.8–1.8)

## 2023-05-04 ENCOUNTER — Ambulatory Visit: Payer: Self-pay | Admitting: *Deleted

## 2023-05-04 NOTE — Telephone Encounter (Signed)
Please call patient back with lab results  I attempted to call her today 05/04/23 at 5pm and there was no answer. I left a voicemail.  I have now released the lab results to mychart. Sorry for the delay  All results are in normal range, except B12 is higher than average. This is not a problem and would not cause fatigue.  Thyroid and Vitamin D were in normal range.  There is no result that explains fatigue and tired.  Saralyn Pilar, DO [facility] 05/04/2023, 5:09 PM

## 2023-05-04 NOTE — Telephone Encounter (Signed)
Summary: results   Stephen White patients wife called to get lab results from 5/10 labs. Please contact her back with results      Called patient 's wife on DPR to review 04/29/23 last results. In review of chart, PCP has not responded to chart results at this time. Will send request to PCP for review . Patient wife requesting a call back possibly today . Review as soon as PCP reviews labs someone from practice will call back if not today by tomorrow. Please advise

## 2023-05-06 ENCOUNTER — Ambulatory Visit: Payer: Medicare HMO | Admitting: Pharmacist

## 2023-05-06 DIAGNOSIS — Z794 Long term (current) use of insulin: Secondary | ICD-10-CM

## 2023-05-06 NOTE — Patient Instructions (Signed)
Goals Addressed             This Visit's Progress    Pharmacy Goals       Our goal A1c is less than 7%. This corresponds with fasting sugars less than 130 and 2 hour after meal sugars less than 180. Please continue to monitor home blood sugar.  Please check your home blood pressure, keep a log of the results and have this record for us to review during our next telephone appointment   Feel free to call me with any questions or concerns. I look forward to our next call!  Dawnielle Christiana Texanna Hilburn, PharmD, BCACP, CPP Clinical Pharmacist [facility] [place] [phone]        

## 2023-05-06 NOTE — Progress Notes (Signed)
   05/06/2023  Patient ID: Stephen White, male   DOB: 08-Jan-1936, 87 y.o.   MRN: [id number]  Receive messages from [facility] CPhT Pattricia Boss and PCP. Noreene Larsson advises patient wondering if more of his blood sugar management could be shifted from Endocrinology to PCP office to reduce his travel.  PCP advises willing to assist patient to reduce office visits/travel burden.   Note patient has an upcoming appointment with Endocrinology scheduled for 05/12/2023.   Follow up with patient today by telephone. Note at latest Office Visit with PCP on 04/29/2023, provider advised patient he may adjust his Novolog meal time insulin as follows:  HOLD or SKIP if < 100 Take reduced dose 25 units if >100-200 Take reduced dose of 30 units if >200  And to review with Endocrinologist at upcoming on 05/12/23  Today patient shares that he has further self-adjusted his Novolog dose to taking 20 units before meals. Reports blood sugar today after lunch: 230  Advise patient to discuss with Endocrinologist at appointment on 05/12/2023.  Note patient currently seen ~every 3 months by Endocrinology. Let patient know PCP agrees important for him to continue to have every 3 month Office Visits to continue to manage blood sugar, but provider willing to help with more of these visits.  Plan:  Patient to discuss with Endocrinology whether okay to see Endocrinology every 6 months and have Office Visits with PCP in between.   Follow Up Plan: Clinical Pharmacist will follow up with patient by telephone on 10/19/2023 at 1 pm   Estelle Grumbles, PharmD, [employer], CPP Clinical Pharmacist [facility] [phone]

## 2023-06-04 ENCOUNTER — Encounter: Payer: Self-pay | Admitting: Pulmonary Disease

## 2023-08-17 ENCOUNTER — Encounter: Payer: Self-pay | Admitting: Pulmonary Disease

## 2023-08-17 ENCOUNTER — Ambulatory Visit (INDEPENDENT_AMBULATORY_CARE_PROVIDER_SITE_OTHER): Payer: Medicare HMO | Admitting: Pulmonary Disease

## 2023-08-17 VITALS — BP 124/70 | HR 72 | Temp 98.2°F | Ht 66.0 in | Wt 188.0 lb

## 2023-08-17 DIAGNOSIS — E1122 Type 2 diabetes mellitus with diabetic chronic kidney disease: Secondary | ICD-10-CM

## 2023-08-17 DIAGNOSIS — Z794 Long term (current) use of insulin: Secondary | ICD-10-CM | POA: Diagnosis not present

## 2023-08-17 DIAGNOSIS — N184 Chronic kidney disease, stage 4 (severe): Secondary | ICD-10-CM

## 2023-08-17 DIAGNOSIS — F458 Other somatoform disorders: Secondary | ICD-10-CM

## 2023-08-17 NOTE — Progress Notes (Signed)
Subjective:    Patient ID: Stephen White, male    DOB: 1936-10-29, 87 y.o.   MRN: [id number]  Patient Care Team: Stephen Cords, DO as PCP - General (Family Medicine) Stephen Crane, MD as Consulting Physician (Ophthalmology) Stephen White Stephen Salvage, MD as Physician Assistant (Endocrinology) Stephen Evener, MD as Consulting Physician (Dermatology) Stephen White Stephen White, RPH-CPP as Pharmacist  Chief Complaint  Patient presents with   Follow-up    SOB. No wheezing. Dry cough in the AM.   HPI Stephen White is an 87 year old male former minimal smoker seen for previously for evaluation of shortness of breath Medical history significant for diabetes chronic kidney diseaseStage IV, peripheral vascular disease, autonomic dysfunction and coronary artery disease, CHF.   Patient presents for follow-up today.  Last seen by Stephen Oaks, NP on 27 July 2022 for the details of that visit please refer to that note.  The patient had a workup that did not show any pulmonary reason for his this tachypnea and shortness of breath.  It was postulated that these were likely due to metabolic issues.  Since his last visit here he states that he had his insulin dosages adjusted and that his insulin dose was too high causing hypoglycemia which was driving a lot of the issues with his dyspnea.  Since this adjusted he is doing better.  He still has some issues with sighing respirations and shallow breathing but he states that he has done this from "time immemorial".  He feels that he is at baseline.  Since his prior visit he has not had any fevers, chills or sweats.  No cough or sputum production.  No hemoptysis.  He feels that he is well.  Previously he appeared to be anxious and tremulous, very tearful and diaphoretic at times.  All this has resolved.  He does not describe any sleep disturbances.  Review of systems is otherwise unremarkable today.    TEST/EVENTS :  Echo 02/2022 nml EF , Nml RVSF , no valvular  stenosis (care everywhere)  Chest x-ray clear VQ scan normal ABG normal PO2, pH 7.46, PCO2 29, O2 saturation 98%  CBC with no anemia, eosinophil absolute count elevated at 832 PFT showed very mild asthma versus reactive airways with no real airflow obstruction or restriction.  There was mild reversibility and small airway reversibility, FEV1 at 95%, ratio 76, FVC 87%, 10% bronchodilator change, DLCO 84%. Overnight oximetry May 07, 2022 shows minimum desaturations while sleeping.  4 minutes 29 seconds was spent less than 88% with average O2 saturation at 92%.  No oxygen was started  MRI brain negative for acute process.  Chronic microvascular ischemic disease and cerebral atrophy.  Similar chronic arachnoid cyst in the posterior fossa   Review of Systems A 10 point review of systems was performed and it is as noted above otherwise negative.   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 without macular edema, with long-term current use of insulin (HCC) 12/11/2019   Background diabetic retinopathy  associated with type 2 diabetes mellitus (HCC) 09/27/2019   Type 2 diabetes mellitus with diabetic nephropathy, with long-term current use of insulin (HCC) 02/14/2019   Flatulence 11/03/2016   Multiple actinic keratoses 05/05/2015   Arthropathia 05/05/2015   CAFL (chronic airflow limitation) (HCC) 05/05/2015   Impotence of organic origin 05/05/2015   Gastro-esophageal reflux disease  without esophagitis 05/05/2015   Retinopathy 12/23/2014   Microalbuminuria 12/23/2014   CKD (chronic kidney disease), stage IV (HCC) 05/07/2010   Allergic rhinitis 05/14/2008   Benign hypertension with CKD (chronic kidney disease) stage IV (HCC) 10/18/2007   HLD (hyperlipidemia) 09/06/2007    Social History   Tobacco Use   Smoking status: Former    Current packs/day: 0.00    Average packs/day: 0.3 packs/day for 25.0 years (7.5 ttl pk-yrs)    Types: Cigarettes    Start date: 12/19/1953    Quit date: 12/19/1978    Years since quitting: 44.6   Smokeless tobacco: Former  Substance Use Topics   Alcohol use: Yes    Alcohol/week: 0.0 standard drinks of alcohol    No Known Allergies  Current Meds  Medication Sig   ALPRAZolam (XANAX) 0.5 MG tablet Take 1 tablet (0.5 mg total) by mouth 2 (two) times daily as needed for anxiety. May start with half pill for 0.25mg  dose. May increase in future if needed.   aspirin 81 MG EC tablet Take 81 mg by mouth daily   Blood Glucose Monitoring Suppl (ONETOUCH VERIO FLEX SYSTEM) w/Device KIT Use to check blood sugar up to twice daily as directed   Carboxymethylcellulose Sodium (EYE DROPS OP) Apply to eye.   Continuous Blood Gluc Receiver (FREESTYLE LIBRE 2 READER) DEVI by Does not apply route.   Continuous Blood Gluc Sensor (FREESTYLE LIBRE 14 DAY SENSOR) MISC 1 kit.   famotidine (PEPCID) 10 MG tablet Take 1 tablet (10 mg total) by mouth daily. (Patient taking differently: Take 10 mg by mouth every other day. As needed)   gabapentin (NEURONTIN) 300 MG capsule Take 300 mg by mouth as needed.   glucose blood (ONETOUCH VERIO) test strip Use to check blood sugar twice daily   GVOKE HYPOPEN 2-PACK 1 MG/0.2ML SOAJ Inject 1 mg into the skin as needed (hypoglycemia).   hydrochlorothiazide (HYDRODIURIL) 25 MG tablet Take 0.5 tablets (12.5 mg total) by mouth daily.   insulin aspart (NOVOLOG) 100 UNIT/ML FlexPen Take NovoLog 32-35 units with meals following  direction from Endocrinologist   Lancets (ONETOUCH DELICA PLUS LANCET30G) MISC Use to check blood sugar twice daily   levalbuterol (XOPENEX HFA) 45 MCG/ACT inhaler Inhale 1 puff into the lungs every 6 (six) hours as needed for wheezing.   losartan (COZAAR) 50 MG tablet Take 1 tablet (50 mg total) by mouth daily.   Multiple Vitamins-Minerals (PX COMPLETE SENIOR MULTIVITS) TABS Take by mouth.   SALINE MIST SPRAY NA Place into the nose.   vitamin B-12 (CYANOCOBALAMIN) 500 MCG tablet Take 500 mcg by mouth daily.    Immunization History  Administered Date(s) Administered   Fluad Quad(high Dose 65+) 11/02/2019, 10/16/2020, 12/29/2021, 09/21/2022   Influenza, High Dose Seasonal PF 10/03/2015, 11/03/2016, 10/06/2017, 11/06/2018   PFIZER(Purple Top)SARS-COV-2 Vaccination 01/14/2020, 02/04/2020   Pneumococcal Conjugate-13 12/24/2014   Pneumococcal Polysaccharide-23 12/25/2012   Td 07/02/2015   Zoster, Live 12/25/2012      Objective:   BP 124/70 (BP Location: Right Arm, Cuff Size: Normal)   Pulse 72   Temp 98.2 F (36.8 C) (Oral)   Ht 5\' 6"  (1.676 m)  Wt 188 lb (85.3 kg)   SpO2 97%   BMI 30.34 kg/m   SpO2: 97 %  GENERAL: Overweight elderly gentleman, appears calm and cooperative, occasional sighing respiration. HEAD: Normocephalic, atraumatic.  EYES: Pupils equal, round, reactive to light.  No scleral icterus.  MOUTH: Dentition intact, oral mucosa moist. NECK: Supple. No thyromegaly. Trachea midline. No JVD.  No adenopathy. PULMONARY: Good air entry bilaterally.  No adventitious sounds. CARDIOVASCULAR: S1 and S2. Regular rate and rhythm.  No rubs, murmurs or gallops heard. ABDOMEN: Obese, no ascites, otherwise benign. MUSCULOSKELETAL: No joint deformity, no clubbing, no edema of the LEs.  NEUROLOGIC: No overt focal deficit.  No asterixis.  No gait disturbance. SKIN: Intact,warm,dry. PSYCH: Calm, cooperative, normal behavior   Assessment & Plan:     ICD-10-CM   1.  Hyperventilation syndrome  F45.8    Patient appears to have resolved this issues It appears that he was having episodes of hypoglycemia    2. Type 2 diabetes mellitus with stage 4 chronic kidney disease, with long-term current use of insulin (HCC)  E11.22    N18.4    Z79.4    This issue adds complexity to his management Follow-up with primary MD    3. CKD (chronic kidney disease), stage IV (HCC)  N18.4    This issue adds complexity to his management Continue follow-up with nephrology      Patient has resolved his issues with dyspnea.  He should continue follow-up with primary care.  Will be glad to see him again as needed.    Gailen Shelter, MD Advanced Bronchoscopy PCCM [facility]    *This note was dictated using voice recognition software/Dragon.  Despite best efforts to proofread, errors can occur which can change the meaning. Any transcriptional errors that result from this process are unintentional and may not be fully corrected at the time of dictation.

## 2023-08-17 NOTE — Patient Instructions (Signed)
Your lungs sounded really clear today.  Continue following with Dr. Althea Charon.  We will see you here on an as-needed basis.

## 2023-08-25 ENCOUNTER — Ambulatory Visit: Payer: Medicare HMO | Admitting: Dermatology

## 2023-09-23 ENCOUNTER — Other Ambulatory Visit: Payer: Self-pay | Admitting: Family Medicine

## 2023-09-23 DIAGNOSIS — I129 Hypertensive chronic kidney disease with stage 1 through stage 4 chronic kidney disease, or unspecified chronic kidney disease: Secondary | ICD-10-CM

## 2023-10-03 ENCOUNTER — Ambulatory Visit: Payer: Medicare HMO | Admitting: Dermatology

## 2023-10-03 DIAGNOSIS — C4442 Squamous cell carcinoma of skin of scalp and neck: Secondary | ICD-10-CM | POA: Diagnosis not present

## 2023-10-03 DIAGNOSIS — L570 Actinic keratosis: Secondary | ICD-10-CM | POA: Diagnosis not present

## 2023-10-03 DIAGNOSIS — Z872 Personal history of diseases of the skin and subcutaneous tissue: Secondary | ICD-10-CM

## 2023-10-03 DIAGNOSIS — L821 Other seborrheic keratosis: Secondary | ICD-10-CM

## 2023-10-03 DIAGNOSIS — W908XXA Exposure to other nonionizing radiation, initial encounter: Secondary | ICD-10-CM | POA: Diagnosis not present

## 2023-10-03 DIAGNOSIS — L578 Other skin changes due to chronic exposure to nonionizing radiation: Secondary | ICD-10-CM | POA: Diagnosis not present

## 2023-10-03 DIAGNOSIS — D492 Neoplasm of unspecified behavior of bone, soft tissue, and skin: Secondary | ICD-10-CM

## 2023-10-03 NOTE — Progress Notes (Signed)
Follow-Up Visit   Subjective  Stephen White is a 87 y.o. male who presents for the following: The patient has spots on his face,scalp and neck to be evaluated, some may be new or changing and the patient may have concern these could be cancer. Hx of Aks    The following portions of the chart were reviewed this encounter and updated as appropriate: medications, allergies, medical history  Review of Systems:  No other skin or systemic complaints except as noted in HPI or Assessment and Plan.  Objective  Well appearing patient in no apparent distress; mood and affect are within normal limits.    A focused examination was performed of the following areas:face,scalp,neck    Relevant exam findings are noted in the Assessment and Plan.  face, ears x 22 (22) Erythematous thin papules/macules with gritty scale.   right lateral neck 2.2 cm keratotic papule          Assessment & Plan   ACTINIC DAMAGE - chronic, secondary to cumulative UV radiation exposure/sun exposure over time - diffuse scaly erythematous macules with underlying dyspigmentation - Recommend daily broad spectrum sunscreen SPF 30+ to sun-exposed areas, reapply every 2 hours as needed.  - Recommend staying in the shade or wearing long sleeves, sun glasses (UVA+UVB protection) and wide brim hats (4-inch brim around the entire circumference of the hat). - Call for new or changing lesions.   AK (actinic keratosis) (22) face, ears x 22  Actinic keratoses are precancerous spots that appear secondary to cumulative UV radiation exposure/sun exposure over time. They are chronic with expected duration over 1 year. A portion of actinic keratoses will progress to squamous cell carcinoma of the skin. It is not possible to reliably predict which spots will progress to skin cancer and so treatment is recommended to prevent development of skin cancer.  Recommend daily broad spectrum sunscreen SPF 30+ to sun-exposed areas,  reapply every 2 hours as needed.  Recommend staying in the shade or wearing long sleeves, sun glasses (UVA+UVB protection) and wide brim hats (4-inch brim around the entire circumference of the hat). Call for new or changing lesions.   Destruction of lesion - face, ears x 22 (22) Complexity: simple   Destruction method: cryotherapy   Informed consent: discussed and consent obtained   Timeout:  patient name, date of birth, surgical site, and procedure verified Lesion destroyed using liquid nitrogen: Yes   Region frozen until ice ball extended beyond lesion: Yes   Outcome: patient tolerated procedure well with no complications   Post-procedure details: wound care instructions given    Neoplasm of skin right lateral neck  Epidermal / dermal shaving  Lesion diameter (cm):  2.2 Informed consent: discussed and consent obtained   Timeout: patient name, date of birth, surgical site, and procedure verified   Procedure prep:  Patient was prepped and draped in usual sterile fashion Prep type:  Isopropyl alcohol Anesthesia: the lesion was anesthetized in a standard fashion   Anesthetic:  1% lidocaine w/ epinephrine 1-100,000 buffered w/ 8.4% NaHCO3 Hemostasis achieved with: pressure, aluminum chloride and electrodesiccation   Outcome: patient tolerated procedure well   Post-procedure details: sterile dressing applied and wound care instructions given   Dressing type: bandage and petrolatum    Destruction of lesion  Destruction method: electrodesiccation and curettage   Informed consent: discussed and consent obtained   Timeout:  patient name, date of birth, surgical site, and procedure verified Curettage performed in three different directions: Yes  Electrodesiccation performed over the curetted area: Yes   Lesion length (cm):  2.2 Lesion width (cm):  2.2 Margin per side (cm):  0.2 Final wound size (cm):  2.6 Hemostasis achieved with:  pressure, aluminum chloride and  electrodesiccation Outcome: patient tolerated procedure well with no complications   Post-procedure details: wound care instructions given    Specimen 1 - Surgical pathology Differential Diagnosis: R/O SCC vs BCC  Check Margins: No  SEBORRHEIC KERATOSIS - Stuck-on, waxy, tan-brown papules and/or plaques  - Benign-appearing - Discussed benign etiology and prognosis. - Observe - Call for any changes   Return in about 6 months (around 04/02/2024) for AKs .  IAngelique Holm, CMA, am acting as scribe for Armida Sans, MD .   Documentation: I have reviewed the above documentation for accuracy and completeness, and I agree with the above.  Armida Sans, MD

## 2023-10-03 NOTE — Patient Instructions (Addendum)
Cryotherapy Aftercare  Wash gently with soap and water everyday.   Apply Vaseline and Band-Aid daily until healed.    Wound Care Instructions  Cleanse wound gently with soap and water once a day then pat dry with clean gauze. Apply a thin coat of Petrolatum (petroleum jelly, "Vaseline") over the wound (unless you have an allergy to this). We recommend that you use a new, sterile tube of Vaseline. Do not pick or remove scabs. Do not remove the yellow or white "healing tissue" from the base of the wound.  Cover the wound with fresh, clean, nonstick gauze and secure with paper tape. You may use Band-Aids in place of gauze and tape if the wound is small enough, but would recommend trimming much of the tape off as there is often too much. Sometimes Band-Aids can irritate the skin.  You should call the office for your biopsy report after 1 week if you have not already been contacted.  If you experience any problems, such as abnormal amounts of bleeding, swelling, significant bruising, significant pain, or evidence of infection, please call the office immediately.  FOR ADULT SURGERY PATIENTS: If you need something for pain relief you may take 1 extra strength Tylenol (acetaminophen) AND 2 Ibuprofen (200mg  each) together every 4 hours as needed for pain. (do not take these if you are allergic to them or if you have a reason you should not take them.) Typically, you may only need pain medication for 1 to 3 days.         Due to recent changes in healthcare laws, you may see results of your pathology and/or laboratory studies on MyChart before the doctors have had a chance to review them. We understand that in some cases there may be results that are confusing or concerning to you. Please understand that not all results are received at the same time and often the doctors may need to interpret multiple results in order to provide you with the best plan of care or course of treatment. Therefore, we ask  that you please give [place] 2 business days to thoroughly review all your results before contacting the office for clarification. Should we see a critical lab result, you will be contacted sooner.   If You Need Anything After Your Visit  If you have any questions or concerns for your doctor, please call our main line at [phone] and press option 4 to reach your doctor's medical assistant. If no one answers, please leave a voicemail as directed and we will return your call as soon as possible. Messages left after 4 pm will be answered the following business day.   You may also send [place] a message via MyChart. We typically respond to MyChart messages within 1-2 business days.  For prescription refills, please ask your pharmacy to contact our office. Our fax number is [phone].  If you have an urgent issue when the clinic is closed that cannot wait until the next business day, you can page your doctor at the number below.    Please note that while we do our best to be available for urgent issues outside of office hours, we are not available 24/7.   If you have an urgent issue and are unable to reach [place], you may choose to seek medical care at your doctor's office, retail clinic, urgent care center, or emergency room.  If you have a medical emergency, please immediately call 911 or go to the emergency department.  Pager Numbers  -  Dr. Gwen Pounds: [phone]  - Dr. Roseanne Reno: [phone]  - Dr. Katrinka Blazing: [phone]   In the event of inclement weather, please call our main line at [phone] for an update on the status of any delays or closures.  Dermatology Medication Tips: Please keep the boxes that topical medications come in in order to help keep track of the instructions about where and how to use these. Pharmacies typically print the medication instructions only on the boxes and not directly on the medication tubes.   If your medication is too expensive, please contact our office at  [phone] option 4 or send [place] a message through MyChart.   We are unable to tell what your co-pay for medications will be in advance as this is different depending on your insurance coverage. However, we may be able to find a substitute medication at lower cost or fill out paperwork to get insurance to cover a needed medication.   If a prior authorization is required to get your medication covered by your insurance company, please allow [place] 1-2 business days to complete this process.  Drug prices often vary depending on where the prescription is filled and some pharmacies may offer cheaper prices.  The website www.goodrx.com contains coupons for medications through different pharmacies. The prices here do not account for what the cost may be with help from insurance (it may be cheaper with your insurance), but the website can give you the price if you did not use any insurance.  - You can print the associated coupon and take it with your prescription to the pharmacy.  - You may also stop by our office during regular business hours and pick up a GoodRx coupon card.  - If you need your prescription sent electronically to a different pharmacy, notify our office through [facility] or by phone at [phone] option 4.     Si Usted Necesita Algo Despus de Su Visita  Tambin puede enviarnos un mensaje a travs de Clinical cytogeneticist. Por lo general respondemos a los mensajes de MyChart en el transcurso de 1 a 2 das hbiles.  Para renovar recetas, por favor pida a su farmacia que se ponga en contacto con nuestra oficina. Annie Sable de fax es [place] [phone].  Si tiene un asunto urgente cuando la clnica est cerrada y que no puede esperar hasta el siguiente da hbil, puede llamar/localizar a su doctor(a) al nmero que aparece a continuacin.   Por favor, tenga en cuenta que aunque hacemos todo lo posible para estar disponibles para asuntos urgentes fuera del horario de [place], no estamos  disponibles las 24 horas del da, los 7 [address]  [address] de la [place].   Si tiene un problema urgente y no puede comunicarse con nosotros, puede optar por buscar atencin mdica  en el consultorio de su doctor(a), en una clnica privada, en un centro de atencin urgente o en una sala de emergencias.  Si tiene Engineer, drilling, por favor llame inmediatamente al 911 o vaya a la sala de emergencias.  Nmeros de bper  - Dr. Gwen Pounds: [phone]  - Dra. Roseanne Reno: [phone]  - Dr. Katrinka Blazing: [phone]   En caso de inclemencias del tiempo, por favor llame a Lacy Duverney principal al [phone] para una actualizacin sobre el [place] de cualquier retraso o cierre.  Consejos para la medicacin en dermatologa: Por favor, guarde las cajas en las que vienen los medicamentos de uso tpico para ayudarle a seguir las instrucciones sobre dnde y cmo usarlos. Las farmacias generalmente imprimen las instrucciones  del medicamento slo en las cajas y no directamente en los tubos del medicamento.   Si su medicamento es muy caro, por favor, pngase en contacto con Rolm Gala llamando al [phone] y presione la opcin 4 o envenos un mensaje a travs de Clinical cytogeneticist.   No podemos decirle cul ser su copago por los medicamentos por adelantado ya que esto es diferente dependiendo de la cobertura de su seguro. Sin embargo, es posible que podamos encontrar un medicamento sustituto a Audiological scientist un formulario para que el seguro cubra el medicamento que se considera necesario.   Si se requiere una autorizacin previa para que su compaa de seguros [place] su medicamento, por favor permtanos de 1 a 2 das hbiles para completar [address]  Los precios de los medicamentos varan con frecuencia dependiendo del Environmental consultant de dnde se surte la receta y alguna farmacias pueden ofrecer precios ms baratos.  El sitio web www.goodrx.com tiene cupones para medicamentos de Health and safety inspector. Los precios aqu no  tienen en cuenta lo que podra costar con la ayuda del seguro (puede ser ms barato con su seguro), pero el sitio web puede darle el precio si no utiliz Tourist information centre manager.  - Puede imprimir el cupn correspondiente y llevarlo con su receta a la farmacia.  - Tambin puede pasar por nuestra oficina durante el horario de atencin regular y Education officer, museum una tarjeta de cupones de GoodRx.  - Si necesita que su receta se enve electrnicamente a una farmacia diferente, informe a nuestra oficina a travs de MyChart de [place] o por telfono llamando al [phone] y presione la opcin 4.

## 2023-10-05 ENCOUNTER — Telehealth: Payer: Self-pay

## 2023-10-05 LAB — SURGICAL PATHOLOGY

## 2023-10-05 NOTE — Telephone Encounter (Signed)
Advised pt of bx results/sh ?

## 2023-10-05 NOTE — Telephone Encounter (Signed)
-----   Message from Armida Sans sent at 10/05/2023  5:33 PM EDT ----- FINAL DIAGNOSIS        1. Skin, right lateral neck :       WELL DIFFERENTIATED SQUAMOUS CELL CARCINOMA   Cancer = SCC Well differentiated Already treated Recheck next visit

## 2023-10-07 ENCOUNTER — Ambulatory Visit (INDEPENDENT_AMBULATORY_CARE_PROVIDER_SITE_OTHER): Payer: Medicare HMO

## 2023-10-07 ENCOUNTER — Encounter: Payer: Self-pay | Admitting: Dermatology

## 2023-10-07 DIAGNOSIS — Z23 Encounter for immunization: Secondary | ICD-10-CM

## 2023-10-17 ENCOUNTER — Other Ambulatory Visit: Payer: Self-pay | Admitting: Family Medicine

## 2023-10-17 DIAGNOSIS — I129 Hypertensive chronic kidney disease with stage 1 through stage 4 chronic kidney disease, or unspecified chronic kidney disease: Secondary | ICD-10-CM

## 2023-10-18 MED ORDER — HYDROCHLOROTHIAZIDE 12.5 MG PO TABS: 12.5000 mg | ORAL_TABLET | Freq: Every day | ORAL | 3 refills | Status: DC

## 2023-10-18 NOTE — Telephone Encounter (Signed)
Requested medication (s) are due for refill today:   No  Requested medication (s) are on the active medication list:   Yes  Future visit scheduled:   Yes 03/02/2024 for AWV   Last ordered: 04/29/2023 #45,3 refills  Unable to refill due to labs being due per protocol   Requested Prescriptions  Pending Prescriptions Disp Refills   hydrochlorothiazide (HYDRODIURIL) 25 MG tablet [Pharmacy Med Name: HYDROCHLOROTHIAZIDE 25 MG TAB] 90 tablet 2    Sig: TAKE 1 TABLET BY MOUTH EVERY DAY     Cardiovascular: Diuretics - Thiazide Failed - 10/17/2023 11:20 AM      Failed - Cr in normal range and within 180 days    Creat  Date Value Ref Range Status  02/24/2021 2.69 (H) 0.70 - 1.11 mg/dL Final    Comment:    For patients >72 years of age, the reference limit for Creatinine is approximately 13% higher for people identified as African-American. .    Creatinine, Ser  Date Value Ref Range Status  03/09/2021 2.31 (H) 0.61 - 1.24 mg/dL Final         Failed - K in normal range and within 180 days    Potassium  Date Value Ref Range Status  03/09/2021 4.1 3.5 - 5.1 mmol/L Final         Failed - Na in normal range and within 180 days    Sodium  Date Value Ref Range Status  03/09/2021 137 135 - 145 mmol/L Final  11/07/2018 141 134 - 144 mmol/L Final         Passed - Last BP in normal range    BP Readings from Last 1 Encounters:  08/17/23 124/70         Passed - Valid encounter within last 6 months    Recent Outpatient Visits           5 months ago Type 2 diabetes mellitus with diabetic nephropathy, with long-term current use of insulin (HCC)   [place] [facility] Delles, Gentry Fitz A, RPH-CPP   5 months ago Type 2 diabetes mellitus with diabetic nephropathy, with long-term current use of insulin [facility])   [place] [facility] [place], Netta Neat, DO   6 months ago Type 2 diabetes mellitus with diabetic nephropathy, with long-term current use of  insulin (HCC)   [place] [facility] Delles, Gentry Fitz A, RPH-CPP   9 months ago Type 2 diabetes mellitus with diabetic nephropathy, with long-term current use of insulin (HCC)   [place] [facility] [place] Delles, Gentry Fitz A, RPH-CPP   9 months ago Type 2 diabetes mellitus with diabetic nephropathy, with long-term current use of insulin (HCC)   [place] [facility] Delles, Jackelyn Poling, RPH-CPP       Future Appointments             In 7 months Deirdre Evener, MD [facility]

## 2023-10-19 ENCOUNTER — Ambulatory Visit: Payer: Medicare HMO | Admitting: Pharmacist

## 2023-10-19 DIAGNOSIS — E1121 Type 2 diabetes mellitus with diabetic nephropathy: Secondary | ICD-10-CM

## 2023-10-19 DIAGNOSIS — Z794 Long term (current) use of insulin: Secondary | ICD-10-CM

## 2023-10-19 NOTE — Patient Instructions (Signed)
Goals Addressed             This Visit's Progress    Pharmacy Goals       Please watch the mail for an envelope from [facility] containing the patient assistance program application. Please complete this application and mail back to [facility] Pharmacy Technician Noreene Larsson [place] along with a copy of your Medicare Part D prescription card and a copy of your proof of income document OR you can bring these documents to the office to have them faxed back to Attention: Pattricia Boss at Fax # [phone]   If you need to call Noreene Larsson, you can reach her at [phone]  If you need to reach out to patient assistance programs regarding refills or to find out the status of your application, you can do so by calling:  [name] at [phone]  Our goal A1c is less than 7%. This corresponds with fasting sugars less than 130 and 2 hour after meal sugars less than 180. Please continue to monitor home blood sugar.  Please check your home blood pressure, keep a log of the results and have this record for [place] to review during our next telephone appointment  Feel free to call me with any questions or concerns. I look forward to our next call!  Estelle Grumbles, PharmD, P[place]y [place], CPP Clinical Pharmacist [place] [facility] [phone]

## 2023-10-19 NOTE — Progress Notes (Signed)
10/19/2023 Name: Stephen White MRN: [id number] DOB: Feb 03, 1936  Chief Complaint  Patient presents with   Medication Assistance   Medication Management    Stephen White is a 87 y.o. year old male who presented for a telephone visit.   They were referred to the pharmacist by their PCP for assistance in managing diabetes, hypertension, hyperlipidemia, and medication access.    Subjective:  Care Team: Primary Care Provider: Smitty Cords, DO Cardiologist: Marcina Millard, MD Endocrinologist Lang[place] Reusing, [place]; Next Scheduled Visit: 11/14/2023 Pulmonologist: Julio Sicks, NP Nephrologist: Mosetta Pigeon, MD; Next Scheduled Visit: 11/07/2023 Dermatologist: Deirdre Evener, MD  Medication Access/Adherence  [facility] Pharmacy[facility]  [phone] [place], [place] - [address] [place] [address] Phone: [phone] Fax: [phone]  [facility] [address], [place] - [address] [place] [address] Phone: [phone] Fax: [phone]  [facility] [phone] [place], [place] - [address] [place] [address] Phone: [phone] Fax: [phone]  [facility] - [place], [place] - [address] [place] [address] Phone: [phone] Fax: [phone]  [id number] - [place], [place] - [address] AT [facility] [address] [facility] [address] [place] [address] Phone: [phone] Fax: [phone]   Patient reports affordability concerns with their medications: No  Patient reports access/transportation concerns to their pharmacy: No  Patient reports adherence concerns with their medications:  No       Diabetes:   Patient followed by [facility] Endocrinology   Current medications:  Tresiba 15 units nightly NovoLog 20 units with meals (twice daily)   Patient using FreeStyle Libre 2  continuous glucose monitor (CGM) Orders sensor refills from [employer] Target goal range: 80-180 mg/dL Alarms set to go off for readings outside of range: 70-240 mg/dL   Reports today before breakfast reading: 156   Denies symptoms of hypoglycemia, such as shaking, sweating or dizziness - Note has glucose tablets that he carries with him to use if needed for hypoglycemia   Current medication access support: Patient enrolled in [name] patient assistance program for [name] and Novolog through 12/20/2023  - Reports currently has plenty of both insulins remaining at home    Objective:  Lab Results  Component Value Date   HGBA1C 8.7 04/13/2022  Per shared record from [facility], latest A1C: 8.6% on 05/12/2023  Lab Results  Component Value Date   CREATININE 2.31 (H) 03/09/2021   BUN 49 (H) 03/09/2021   NA 137 03/09/2021   K 4.1 03/09/2021   CL 103 03/09/2021   CO2 26 03/09/2021    Lab Results  Component Value Date   CHOL 152 08/01/2020   HDL 56 08/01/2020   LDLCALC 82 08/01/2020   TRIG 68 08/01/2020   CHOLHDL 2.7 08/01/2020    Medications Reviewed Today     Reviewed by Manuela Neptune, RPH-CPP (Pharmacist) on 10/19/23 at 1240  Med List Status: <None>   Medication Order Taking? Sig Documenting Provider Last Dose Status Informant  ALPRAZolam (XANAX) 0.5 MG tablet [id number]  Take 1 tablet (0.5 mg total) by mouth 2 (two) times daily as needed for anxiety. May start with half pill for 0.25mg  dose. May increase in future if needed. Smitty Cords, DO  Active   aspirin 81 MG EC tablet [id number]  Take 81 mg by mouth daily [provider]  Active   Blood Glucose Monitoring Suppl (ONETOUCH VERIO FLEX SYSTEM) w/Device KIT [id number]  Use to check blood sugar up to twice daily as directed Smitty Cords, DO  Active   Carboxymethylcellulose Sodium (EYE DROPS OP) [id number]  Apply to eye. [provider]  Active    Continuous Blood Gluc Receiver (FREESTYLE LIBRE 2 READER) DEVI [id number]  by Does not apply route. [provider]  Active   Continuous Blood Gluc Sensor (FREESTYLE LIBRE 14 DAY SENSOR) [place] [id number]  1 kit. [provider]  Active   famotidine (PEPCID) 10 MG tablet [id number]  Take 1 tablet (10 mg total) by mouth daily.  Patient taking differently: Take 10 mg by mouth every other day. As needed   Smitty Cords, DO  Active   gabapentin (NEURONTIN) 300 MG capsule [id number]  Take 300 mg by mouth as needed. [provider]  Active   glucose blood (ONETOUCH VERIO) test strip [id number]  Use to check blood sugar twice daily Karamalegos, Netta Neat, DO  Active   GVOKE HYPOPEN 2-PACK 1 MG/0.2ML Ivory Broad [id number]  Inject 1 mg into the skin as needed (hypoglycemia). Smitty Cords, DO  Active   hydrochlorothiazide (HYDRODIURIL) 12.5 MG tablet [id number]  Take 1 tablet (12.5 mg total) by mouth daily. Karamalegos, Netta Neat, DO  Active   insulin aspart (NOVOLOG) 100 UNIT/ML FlexPen [id number] Yes Inject 20 Units into the skin 2 (two) times daily with a meal. [provider] Taking Active   insulin degludec (TRESIBA) 100 UNIT/ML FlexTouch Pen [id number] Yes Inject 15 Units into the skin at bedtime. [provider] Taking Active   Lancets Letta Pate [place] PLUS [place]) MISC [id number]  Use to check blood sugar twice daily Smitty Cords, DO  Active   levalbuterol [facility] HFA) 45 MCG/ACT inhaler [id number]  Inhale 1 puff into the lungs every 6 (six) hours as needed for wheezing. Parrett, Virgel Bouquet, NP  Active   losartan (COZAAR) 50 MG tablet [id number]  Take 1 tablet (50 mg total) by mouth daily. Smitty Cords, DO  Active   Multiple Vitamins-Minerals [facility] COMPLETE SENIOR MULTIVITS) TABS [id number]  Take by mouth. [provider]  Active   SALINE MIST [place] NA [id number]  Place into the nose. [provider]  Active    vitamin B-12 (CYANOCOBALAMIN) 500 MCG tablet [id number]  Take 500 mcg by mouth daily. [provider]  Active               Assessment/Plan:   Diabetes: - Review importance of taking Novolog doses within 10 minutes before meals, or at least with first bite of food, as directed by Endocrinologist - Have counseled on s/s of low blood sugar, prevention of lows and how to manage lows Patient using Freestyle Libre 2 monitor for low blood sugar alarm Patient carries glucose tablets to have in case of hypoglycemia - Recommend to continue to use Freestyle Libre 2 CGM to monitor blood sugar/as feedback on dietary choices - Recommend to check glucose with fingerstick check when needed for symptoms and as back up to CGM. Patient to contact office if needed for readings outside of established parameters or symptoms - Will collaborate with Endocrinologist and CPhT to aid patient with applying for re-enrollment for patient assistance for Novolog, Tresiba and pen needles from [name] for 2025 calendar year  Follow Up Plan: Clinical Pharmacist will follow up with patient by telephone on 01/09/2024 at  1:00 PM    Estelle Grumbles, PharmD, Patsy [place], CPP Clinical Pharmacist [facility] [phone]

## 2023-10-27 ENCOUNTER — Telehealth: Payer: Self-pay | Admitting: Pharmacy Technician

## 2023-10-27 DIAGNOSIS — Z5986 Financial insecurity: Secondary | ICD-10-CM

## 2023-10-27 NOTE — Progress Notes (Signed)
[facility] Customer service manager [facility])                                            [name] Quality Pharmacy Team    10/27/2023  Stephen White 30-Jul-1936 [id number]                                      Medication Assistance Referral  Referral From:  [facility] PharmD Estelle Grumbles  Medication/Company: [name] / [name] Patient application portion:  Mailed Provider application portion: Faxed  to Dr. Langston Reusing Provider address/fax verified via: Office website  Medication/Company: Rubbie Battiest / [name] Patient application portion:  Mailed Provider application portion: Faxed  to Dr. Langston Reusing Provider address/fax verified via: Office website  Pattricia Boss, CPhT [place]  Office: [phone] Fax: [phone] Email: [email or web address]

## 2023-11-24 ENCOUNTER — Other Ambulatory Visit: Payer: Self-pay | Admitting: Pharmacy Technician

## 2023-11-24 DIAGNOSIS — Z5986 Financial insecurity: Secondary | ICD-10-CM

## 2023-11-24 NOTE — Progress Notes (Signed)
Pharmacy Medication Assistance Program Note    11/24/2023  Patient ID: Stephen White, male   DOB: [birth year], 87 y.o.   MRN: [id number]     11/24/2023  Outreach Medication One  Initial Outreach Date (Medication One) 10/26/2023  Manufacturer Medication One [employer] Drugs Novolog  Dose of Novolog FlexPen 100 u/ml  Type of Radiographer, therapeutic Assistance  Date Application Sent to Patient 10/26/2023  Application Items Requested Application;Proof of Income;Other  Date Application Sent to Prescriber 10/26/2023  Name of Prescriber Cassandra Renaye Rakers  Date Application Received From Patient 11/01/2023  Application Items Received From Patient Application;Other  Date Application Received From Provider 11/14/2023  Date Application Submitted to Manufacturer 11/23/2023  Method Application Sent to Manufacturer Fax           11/24/2023  Outreach Medication Two  Initial Outreach Date (Medication Two) 10/26/2023  Manufacturer Medication Two [name]  [facility]ord[facility] Tresiba  Dose of [name] FlexTouch 100 u/ml  Type of Radiographer, therapeutic Assistance  Date Application Sent to Patient 10/26/2023  Application Items Requested Application;Proof of Income;Other  Date Application Sent to Prescriber 10/26/2023  Name of Prescriber Cassandra Renaye Rakers  Date Application Received From Patient 11/01/2023  Application Items Received From Patient Application;Other  Date Application Received From Provider 11/14/2023  Method Application Sent to Manufacturer Fax  Date Application Submitted to Manufacturer 11/23/2023       Signature  Kristopher Glee   Office: [phone] Fax: [phone] Email: [email or web address]

## 2023-12-08 ENCOUNTER — Telehealth: Payer: Self-pay | Admitting: Pharmacy Technician

## 2023-12-08 DIAGNOSIS — Z5986 Financial insecurity: Secondary | ICD-10-CM

## 2023-12-08 NOTE — Progress Notes (Signed)
Pharmacy Medication Assistance Program Note    12/08/2023  Patient ID: Stephen White, male   DOB: 31-May-1936, 87 y.o.   MRN: [id number]     11/24/2023 12/08/2023  Outreach Medication One  Initial Outreach Date (Medication One) 10/26/2023   Manufacturer Medication One [name] Drugs Novolog   Dose of Novolog FlexPen 100 u/ml   Type of Radiographer, therapeutic Assistance   Date Application Sent to Patient 10/26/2023   Application Items Requested Application;Proof of Income;Other   Date Application Sent to Prescriber 10/26/2023   Name of Prescriber Cassandra Renaye Rakers   Date Application Received From Patient 11/01/2023   Application Items Received From Patient Application;Other   Date Application Received From Provider 11/14/2023   Date Application Submitted to Manufacturer 11/23/2023   Method Application Sent to Manufacturer Fax   Patient Assistance Determination  Approved  Approval Start Date  12/21/2023  Approval End Date  12/19/2024  Patient Notification Method  Telephone Call  Telephone Call Outcome  Successful  Additional Outreach Contact  Provider  Contacted Provider  Message        11/24/2023 12/08/2023  Outreach Medication Two  Initial Outreach Date (Medication Two) 10/26/2023   Manufacturer Medication Two [name]   Nordisk Drugs [name]   Dose of [name] FlexTouch 100 u/ml   Type of Radiographer, therapeutic Assistance   Date Application Sent to Patient 10/26/2023   Application Items Requested Application;Proof of Income;Other   Date Application Sent to Prescriber 10/26/2023   Name of Prescriber Cassandra Renaye Rakers   Date Application Received From Patient 11/01/2023   Application Items Received From Patient Application;Other   Date Application Received From Provider 11/14/2023   Method Application Sent to Manufacturer Fax   Date Application Submitted to Manufacturer 11/23/2023   Patient Assistance Determination  Approved   Approval Start Date  12/21/2023  Patient Notification Method  Telephone Call  Telephone Call Outcome  Successful  Additional Outreach Contact  Provider  Contacted Provider  Message     Care coordination call placed to [name] in regard to [name] and Novolog application. Spoke to [name] who informs patient is APPROVED 12/21/23-12/19/24. Medications will auto fill and ship to prescriber's office on file. Patient may call [name] at any time to check on next shipment by calling [phone]. Successful outreach to patient. HIPAA verified. Patient was informed of his approval into the program.  Signature Kristopher Glee [facility]  Office: [phone] Fax: [phone] Email: [email or web address]

## 2023-12-22 ENCOUNTER — Ambulatory Visit: Payer: Medicare HMO | Admitting: Dermatology

## 2023-12-22 ENCOUNTER — Encounter: Payer: Self-pay | Admitting: Dermatology

## 2023-12-22 DIAGNOSIS — D492 Neoplasm of unspecified behavior of bone, soft tissue, and skin: Secondary | ICD-10-CM | POA: Diagnosis not present

## 2023-12-22 DIAGNOSIS — D485 Neoplasm of uncertain behavior of skin: Secondary | ICD-10-CM

## 2023-12-22 DIAGNOSIS — C44329 Squamous cell carcinoma of skin of other parts of face: Secondary | ICD-10-CM | POA: Diagnosis not present

## 2023-12-22 DIAGNOSIS — C4492 Squamous cell carcinoma of skin, unspecified: Secondary | ICD-10-CM

## 2023-12-22 HISTORY — DX: Squamous cell carcinoma of skin, unspecified: C44.92

## 2023-12-22 NOTE — Progress Notes (Signed)
   Follow-Up Visit   Subjective  Stephen White is a 88 y.o. male who presents for the following: spot at right face, was treated with LN2 in October but spot will not go away and is sore. Patient does have hx of BCC and SCC.   The patient has spots, moles and lesions to be evaluated, some may be new or changing and the patient may have concern these could be cancer.   The following portions of the chart were reviewed this encounter and updated as appropriate: medications, allergies, medical history  Review of Systems:  No other skin or systemic complaints except as noted in HPI or Assessment and Plan.  Objective  Well appearing patient in no apparent distress; mood and affect are within normal limits.   A focused examination was performed of the following areas: face  Relevant exam findings are noted in the Assessment and Plan.  Right Zygoma 3.1 cm pink keratotic plaque   Assessment & Plan     NEOPLASM OF UNCERTAIN BEHAVIOR OF SKIN Right Zygoma Skin / nail biopsy Type of biopsy: tangential   Informed consent: discussed and consent obtained   Timeout: patient name, date of birth, surgical site, and procedure verified   Procedure prep:  Patient was prepped and draped in usual sterile fashion Prep type:  Isopropyl alcohol Anesthesia: the lesion was anesthetized in a standard fashion   Anesthetic:  1% lidocaine  w/ epinephrine  1-100,000 buffered w/ 8.4% NaHCO3 Instrument used: DermaBlade   Hemostasis achieved with: pressure and aluminum chloride   Outcome: patient tolerated procedure well   Post-procedure details: sterile dressing applied and wound care instructions given   Dressing type: bandage and petrolatum   Specimen 1 - Surgical pathology Differential Diagnosis: r/o SCC  Check Margins: No 3.1 cm pink keratotic plaque Will refer for Mohs in [facility] pending bx results.   Return for AK follow up, with Dr. MARLA, as scheduled.  LILLETTE Lonell Drones, RMA, am acting as  scribe for Boneta Sharps, MD .   Documentation: I have reviewed the above documentation for accuracy and completeness, and I agree with the above.  Boneta Sharps, MD

## 2023-12-22 NOTE — Patient Instructions (Signed)

## 2023-12-26 ENCOUNTER — Telehealth: Payer: Self-pay

## 2023-12-26 DIAGNOSIS — C4492 Squamous cell carcinoma of skin, unspecified: Secondary | ICD-10-CM

## 2023-12-26 LAB — SURGICAL PATHOLOGY

## 2023-12-26 NOTE — Telephone Encounter (Signed)
 Advised pt of bx results.  Pt would like to treat with mohs in [place].  Advised pt we would send referral to [facility] in [place]

## 2023-12-26 NOTE — Telephone Encounter (Signed)
-----   Message from Marcellus sent at 12/26/2023  2:42 PM EST ----- Diagnosis: right zygoma :       WELL DIFFERENTIATED SQUAMOUS CELL CARCINOMA WITH SUPERFICIAL INFILTRATION    Please call with diagnosis and determine where the patient would like to have Mohs surgery.  Explanation: This is a squamous cell skin cancer that has grown beyond the surface of the skin and is invading the second layer of the skin. It has the potential to spread beyond the skin and threaten your health, so I recommend treating it.  Treatment: Given the location and type of skin cancer, I recommend Mohs surgery. Mohs surgery involves cutting out the skin cancer and then checking under the microscope to ensure the whole skin cancer was removed. If any skin cancer remains, the surgeon will cut out more until it is fully removed. The cure rate is about 98-99%. Once the Mohs surgeon confirms the skin cancer is out, they will discuss the options to repair or heal the area. You must take it easy for about two weeks after surgery (no lifting over 10-15 lbs, avoid activity to get your heart rate and blood pressure up). It is done at another office outside of [place] ([place], [place], or [place]).

## 2024-01-09 ENCOUNTER — Other Ambulatory Visit: Payer: Medicare HMO | Admitting: Pharmacist

## 2024-01-09 DIAGNOSIS — E1121 Type 2 diabetes mellitus with diabetic nephropathy: Secondary | ICD-10-CM

## 2024-01-09 NOTE — Patient Instructions (Signed)
Goals Addressed             This Visit's Progress    Pharmacy Goals       If you need to reach out to patient assistance programs regarding refills or to find out the status of your application, you can do so by calling:  [name] at [phone]  Our goal A1c is less than 7%. This corresponds with fasting sugars less than 130 and 2 hour after meal sugars less than 180. Please continue to monitor home blood sugar.  Please check your home blood pressure, keep a log of the results and have this record for [place] to review during our next telephone appointment  Feel free to call me with any questions or concerns. I look forward to our next call!  Estelle Grumbles, PharmD, Patsy [place], CPP Clinical Pharmacist [facility] [phone]

## 2024-01-09 NOTE — Progress Notes (Signed)
01/09/2024 Name: Stephen White MRN: [id number] DOB: 01/15/1936  Chief Complaint  Patient presents with   Medication Management   Medication Assistance    Stephen White is a 88 y.o. year old male who presented for a telephone visit.   They were referred to the pharmacist by their PCP for assistance in managing diabetes, hypertension, hyperlipidemia, and medication access.      Subjective:   Care Team: Primary Care Provider: Smitty Cords, DO Cardiologist: Marcina Millard, MD Endocrinologist [place] Re[place], [place]; Next Scheduled Visit: 05/15/2024 Pulmonologist: Julio Sicks, NP Nephrologist: Lorain Childes, MD; Next Scheduled Visit: 02/15/2024 Dermatologist: Deirdre Evener, MD  Medication Access/Adherence  [facility]  [phone] [place], [place] - [address] [place] [address] Phone: [phone] Fax: [phone]  [facility] - [place], [place] - [address] [place] [address] Phone: [phone] Fax: [phone]  [facility] #[id number] - [place], [place] - [address] AT [facility] OF [address] [facility] [address] [place] [address] Phone: [phone] Fax: [phone]   Patient reports affordability concerns with their medications: No  Patient reports access/transportation concerns to their pharmacy: No  Patient reports adherence concerns with their medications:  No       Diabetes:   Patient followed by [facility] Endocrinology   Current medications:  Tresiba 15 units nightly NovoLog 20 units with meals (twice daily)   Patient using FreeStyle Libre 2 continuous glucose monitor (CGM) Orders sensor refills from [employer] Target goal range: 80-180 mg/dL Alarms set to go off for readings outside of range: 70-240 mg/dL   Reports today before breakfast reading: 164   Denies symptoms of hypoglycemia, such as shaking, sweating  or dizziness - Note has glucose tablets that he carries with him to use if needed for hypoglycemia   Current medication access support: Patient enrolled in [name] patient assistance program for [name] and Novolog through 12/19/2024   Objective:  Lab Results  Component Value Date   HGBA1C 8.7 04/13/2022    Lab Results  Component Value Date   CREATININE 2.31 (H) 03/09/2021   BUN 49 (H) 03/09/2021   NA 137 03/09/2021   K 4.1 03/09/2021   CL 103 03/09/2021   CO2 26 03/09/2021    Lab Results  Component Value Date   CHOL 152 08/01/2020   HDL 56 08/01/2020   LDLCALC 82 08/01/2020   TRIG 68 08/01/2020   CHOLHDL 2.7 08/01/2020    Medications Reviewed Today     Reviewed by Manuela Neptune, RPH-CPP (Pharmacist) on 01/09/24 at 1309  Med List Status: <None>   Medication Order Taking? Sig Documenting Provider Last Dose Status Informant  ALPRAZolam (XANAX) 0.5 MG tablet [id number]  Take 1 tablet (0.5 mg total) by mouth 2 (two) times daily as needed for anxiety. May start with half pill for 0.25mg  dose. May increase in future if needed. Smitty Cords, DO  Active   aspirin 81 MG EC tablet [id number]  Take 81 mg by mouth daily [provider]  Active   Blood Glucose Monitoring Suppl (ONETOUCH VERIO FLEX SYSTEM) w/Device KIT [id number]  Use to check blood sugar up to twice daily as directed Smitty Cords, DO  Active   Carboxymethylcellulose Sodium (EYE DROPS OP) [id number]  Apply to eye. [provider]  Active   Continuous Blood Gluc Receiver (FREESTYLE LIBRE 2  READER) DEVI [id number]  by Does not apply route. [provider]  Active   Continuous Blood Gluc Sensor (FREESTYLE LIBRE 14 DAY SENSOR) [place] [id number]  1 kit. [provider]  Active   famotidine (PEPCID) 10 MG tablet [id number]  Take 1 tablet (10 mg total) by mouth daily.  Patient taking differently: Take 10 mg by mouth every other day. As needed   Smitty Cords, DO  Active   gabapentin (NEURONTIN) 300 MG capsule [id number]  Take 300 mg by mouth as needed. [provider]  Active   glucose blood (ONETOUCH VERIO) test strip [id number]  Use to check blood sugar twice daily Karamalegos, Netta Neat, DO  Active   GVOKE HYPOPEN 2-PACK 1 MG/0.2ML Ivory Broad [id number]  Inject 1 mg into the skin as needed (hypoglycemia). Smitty Cords, DO  Active   hydrochlorothiazide (HYDRODIURIL) 12.5 MG tablet [id number]  Take 1 tablet (12.5 mg total) by mouth daily. Karamalegos, Netta Neat, DO  Active   insulin aspart (NOVOLOG) 100 UNIT/ML FlexPen [id number] Yes Inject 20 Units into the skin 2 (two) times daily with a meal. [provider] Taking Active   insulin degludec (TRESIBA) 100 UNIT/ML FlexTouch Pen [id number] Yes Inject 15 Units into the skin at bedtime. [provider] Taking Active   Lancets Letta Pate [place] PLUS [place]) MISC [id number]  Use to check blood sugar twice daily Smitty Cords, DO  Active   levalbuterol [facility] HFA) 45 MCG/ACT inhaler [id number]  Inhale 1 puff into the lungs every 6 (six) hours as needed for wheezing. Parrett, Virgel Bouquet, NP  Active   losartan (COZAAR) 50 MG tablet [id number]  Take 1 tablet (50 mg total) by mouth daily. Smitty Cords, DO  Active   Multiple Vitamins-Minerals [facility] COMPLETE SENIOR MULTIVITS) TABS [id number]  Take by mouth. [provider]  Active   SALINE MIST [place] NA [id number]  Place into the nose. [provider]  Active   vitamin B-12 (CYANOCOBALAMIN) 500 MCG tablet [id number]  Take 500 mcg by mouth daily. [provider]  Active               Assessment/Plan:   Encourage patient to contact office to schedule follow up appointment with PCP  Patient states that he will reach out to office today to schedule  Diabetes: - Have counseled on s/s of low blood sugar, prevention of lows and how to manage lows Patient using Freestyle  Libre 2 monitor for low blood sugar alarm Patient carries glucose tablets to have in case of hypoglycemia - Recommend to continue to use Freestyle Libre 2 CGM to monitor blood sugar/as feedback on dietary choices - Recommend to check glucose with fingerstick check when needed for symptoms and as back up to CGM. Patient to contact office if needed for readings outside of established parameters or symptoms - Patient to follow up with [name] patient assistance program as needed for refills of his Novolog and Tresiba   Follow Up Plan: Clinical Pharmacist will follow up with patient by telephone on 06/04/2024 at 1:00 PM     Estelle Grumbles, PharmD, Patsy [place], CPP Clinical Pharmacist [facility] [phone]

## 2024-02-08 ENCOUNTER — Telehealth: Payer: Self-pay

## 2024-02-08 NOTE — Telephone Encounter (Signed)
 MOHs surgery complete. Specimen tracking updated for SCC of Right zygoma. aw

## 2024-02-10 ENCOUNTER — Ambulatory Visit: Payer: Medicare HMO | Admitting: Family Medicine

## 2024-02-13 ENCOUNTER — Encounter: Payer: Self-pay | Admitting: Family Medicine

## 2024-02-13 ENCOUNTER — Ambulatory Visit: Payer: Medicare HMO | Admitting: Family Medicine

## 2024-02-13 VITALS — BP 122/64 | HR 66 | Ht 66.0 in | Wt 188.0 lb

## 2024-02-13 DIAGNOSIS — F8081 Childhood onset fluency disorder: Secondary | ICD-10-CM

## 2024-02-13 DIAGNOSIS — F458 Other somatoform disorders: Secondary | ICD-10-CM | POA: Diagnosis not present

## 2024-02-13 DIAGNOSIS — R251 Tremor, unspecified: Secondary | ICD-10-CM | POA: Diagnosis not present

## 2024-02-13 DIAGNOSIS — C44320 Squamous cell carcinoma of skin of unspecified parts of face: Secondary | ICD-10-CM

## 2024-02-13 DIAGNOSIS — F411 Generalized anxiety disorder: Secondary | ICD-10-CM

## 2024-02-13 MED ORDER — ALPRAZOLAM 0.5 MG PO TABS: 0.5000 mg | ORAL_TABLET | Freq: Two times a day (BID) | ORAL | 2 refills | Status: AC | PRN

## 2024-02-13 NOTE — Patient Instructions (Addendum)
 Thank you for coming to the office today.  This is the site that did the surgery on your face.  [facility] Address: [address], [place], [place] [address] Phone: [phone]   ===========================  Referral to Neurologist as next option for the hyperventilation breathing and stuttering  [facility] - Neurology Dept [address] [place], [place] [address] Phone: [phone]  Refilled Alprazolam for anxiety and to help breathing.   Please schedule a Follow-up Appointment to: Return if symptoms worsen or fail to improve.  If you have any other questions or concerns, please feel free to call the office or send a message through MyChart. You may also schedule an earlier appointment if necessary.  Additionally, you may be receiving a survey about your experience at our office within a few days to 1 week by e-mail or mail. We value your feedback.  Saralyn Pilar, DO [facility], [place]

## 2024-02-13 NOTE — Progress Notes (Signed)
 Subjective:    Patient ID: Bari Edward, male    DOB: 1935/12/24, 88 y.o.   MRN: [id number]  CURTIS CAIN is a 88 y.o. male presenting on 02/13/2024 for Tremors (Stuttering, Hyperventilation)   HPI  Discussed the use of AI scribe software for clinical note transcription with the patient, who gave verbal consent to proceed.  History of Present Illness    ALPHONSE ASBRIDGE "Cindee Lame" is a 88 year old male who presents with breathing difficulties and a non-healing surgical wound.  He has been experiencing breathing difficulties for approximately 4+ years, characterized by episodes of hyperventilation 'real hard' breathing. He occasionally takes half a 'nerve pill' (alprazolam) as needed, which helps alleviate his symptoms. Resting in a quiet place also provides relief. No exacerbation of breathing difficulties with walking or talking, although these activities may cause him to breathe harder. He sometimes experiences chest pain with his breathing issues, but it is not prolonged. - He has worked with multiple specialist for these issues over the past few years including Pulmonology, Nephrology, Endocrinology, Cardiology  He experiences episodes of weakness, during which he feels the need to sit or lie down. He describes shaking in his legs when standing, prompting him to find a seat. He also reports new-onset stuttering that began about a year ago, coinciding with the onset of his breathing difficulties. Alprazolam provides some relief from these symptoms, and he currently has only three or four pills left from a prescription of thirty.   R Face SCC s/p MOHS surgery He has a non-healing surgical wound from a skin cancer excision performed approximately two to three weeks ago. The wound extends from his eye to his ear and has not healed well, with an opening in the middle. His daughter, a Engineer, civil (consulting), assists with wound care by applying Steri-Strips to pull the wound together. He notes bleeding from the  wound, which sometimes drains into his ear. He has not been able to follow up with the surgeon in [place] due to transportation issues.          04/29/2023    9:54 AM 02/25/2023    3:23 PM 02/26/2022    2:09 PM  Depression screen PHQ 2/9  Decreased Interest 0 0 3  Down, Depressed, Hopeless 0 0 0  PHQ - 2 Score 0 0 3  Altered sleeping 2 0 2  Tired, decreased energy 3 0 2  Change in appetite 0 0 0  Feeling bad or failure about yourself  0 0 0  Trouble concentrating 2 0 0  Moving slowly or fidgety/restless 2 0 0  Suicidal thoughts 0 0 0  PHQ-9 Score 9 0 7  Difficult doing work/chores Not difficult at all Not difficult at all Not difficult at all       04/29/2023    9:54 AM  GAD 7 : Generalized Anxiety Score  Nervous, Anxious, on Edge 3  Control/stop worrying 0  Worry too much - different things 0  Trouble relaxing 0  Restless 0  Easily annoyed or irritable 2  Afraid - awful might happen 0  Total GAD 7 Score 5    Social History   Tobacco Use   Smoking status: Former    Current packs/day: 0.00    Average packs/day: 0.3 packs/day for 25.0 years (7.5 ttl pk-yrs)    Types: Cigarettes    Start date: 12/19/1953    Quit date: 12/19/1978    Years since quitting: 45.1   Smokeless tobacco: Former  Vaping Use   Vaping status: Never Used  Substance Use Topics   Alcohol use: Yes    Alcohol/week: 0.0 standard drinks of alcohol   Drug use: No    Review of Systems Per HPI unless specifically indicated above     Objective:    BP 122/64   Pulse 66   Ht 5\' 6"  (1.676 m)   Wt 188 lb (85.3 kg)   SpO2 98%   BMI 30.34 kg/m   Wt Readings from Last 3 Encounters:  02/13/24 188 lb (85.3 kg)  08/17/23 188 lb (85.3 kg)  04/29/23 184 lb (83.5 kg)    Physical Exam Vitals and nursing note reviewed.  Constitutional:      General: He is not in acute distress.    Appearance: Normal appearance. He is well-developed. He is not diaphoretic.     Comments: Well-appearing,  comfortable, cooperative  HENT:     Head: Normocephalic and atraumatic.  Eyes:     General:        Right eye: No discharge.        Left eye: No discharge.     Conjunctiva/sclera: Conjunctivae normal.     Pupils: Pupils are equal, round, and reactive to light.  Neck:     Thyroid: No thyromegaly.  Cardiovascular:     Rate and Rhythm: Normal rate and regular rhythm.     Pulses: Normal pulses.     Heart sounds: Normal heart sounds. No murmur heard. Pulmonary:     Effort: Pulmonary effort is normal. No respiratory distress.     Breath sounds: Normal breath sounds. No wheezing or rales.     Comments: Hyperventilation and dyspnea. Impacts his talking, causes him to pause or stutter Abdominal:     General: Bowel sounds are normal. There is no distension.     Palpations: Abdomen is soft. There is no mass.     Tenderness: There is no abdominal tenderness.  Musculoskeletal:        General: No tenderness. Normal range of motion.     Cervical back: Normal range of motion and neck supple.     Comments: Upper / Lower Extremities: - Normal muscle tone, strength bilateral upper extremities 5/5, lower extremities 5/5  Lymphadenopathy:     Cervical: No cervical adenopathy.  Skin:    General: Skin is warm and dry.     Findings: Lesion (R face lateral area between ear and eye, with approx 3 cm length and 1.5 cm width open wound from MOHS surgery) present. No erythema or rash.  Neurological:     Mental Status: He is alert and oriented to person, place, and time.     Comments: Distal sensation intact to light touch all extremities  Psychiatric:        Mood and Affect: Mood normal.        Behavior: Behavior normal.        Thought Content: Thought content normal.     Comments: Well groomed, good eye contact, normal speech and thoughts       Results for orders placed or performed in visit on 12/22/23  Surgical pathology   Collection Time: 12/22/23 12:00 AM  Result Value Ref Range   SURGICAL  PATHOLOGY      SURGICAL PATHOLOGY [facility] [address] [place], [place] [address] Telephone [phone] or [phone] Fax [phone]  REPORT OF DERMATOPATHOLOGY   Accession #: [id number] Patient Name: ROHEN, KIMES Visit # : [id number]  MRN:  [id number] Cytotechnologist: Zada Girt., Dermatopathologist, Electronic Signature DOB/Age November 04, 1936 (Age: 70) Gender: M Collected Date: 12/22/2023 Received Date: 12/23/2023  FINAL DIAGNOSIS       1. Skin, right zygoma :       WELL DIFFERENTIATED SQUAMOUS CELL CARCINOMA WITH SUPERFICIAL INFILTRATION       DATE SIGNED OUT: 12/26/2023 ELECTRONIC SIGNATURE : Geryl Councilman, Janie Morning., Dermatopathologist, Electronic Signature  MICROSCOPIC DESCRIPTION 1. There are atypical keratinocytes in the epidermis with parakeratosis.  There are small foci of superficially invasive nests of atypical squamous cells in the dermis.  CASE COMMENTS STAINS USED IN DIAGNOSIS: H&E    CLINICAL HISTORY   SPECIMEN(S) OBTAINED 1. Skin, Right Zygoma  SPECIMEN COMMENTS: 1. 3.1 cm pink keratotic plaque SPECIMEN CLINICAL INFORMATION: 1. Neoplasm of uncertain behavior of skin, R/O SCC    Gross Description 1. Formalin fixed specimen received:  7 X 7 X 2 MM, TOTO (3 P) (1 B) ( AE )        Report signed out from the following location(s) . [facility] [address], [place] [address] CLIA #: [id number]  [facility] [address] [place], [place] [address] CLIA #: [id number]       Assessment & Plan:   Problem List Items Addressed This Visit     Hyperventilation syndrome   Relevant Medications   ALPRAZolam (XANAX) 0.5 MG tablet   Other Relevant Orders   Ambulatory referral to Neurology   Other Visit Diagnoses       GAD (generalized anxiety disorder)    -  Primary   Relevant Medications   ALPRAZolam (XANAX) 0.5 MG tablet     Stuttering        Relevant Orders   Ambulatory referral to Neurology     Tremors of nervous system       Relevant Orders   Ambulatory referral to Neurology     Squamous cell carcinoma, face       Relevant Medications   ALPRAZolam (XANAX) 0.5 MG tablet        Anxiety Hyperventilation / Breathing Difficulty Tremors / Stuttering Chronic problem for past 4 years Prior work up with Pulmonology, Nephrology among other specialists and evaluation completed. Reports difficulty breathing and stuttering, which improves with Alprazolam. No clear etiology identified despite evaluation by multiple specialists. New worsening with the stuttering and tremors generalized Has not had Neuro work up -Refill Alprazolam 0.5mg , 30 tablets, to be taken as needed. -Refer to neurology for further evaluation, as symptoms may be neurological in nature.  SCC R Face / Post-Operative Wound Care S/p MOHS surgery done in [place] - See AVS for info [facility] Status post skin cancer removal with wound dehiscence. Steri-strips applied by family member, but wound remains open and is causing discomfort. -Changed dressing in office -Recommended follow-up with surgical team in [facility] for wound evaluation and possible further intervention. Provided patient with contact information for surgical team. We called them today and they offered apt tomorrow 2/25 but he could not arrange transportation in time. We gave him number and he can call back w/ family when they can take him soon. I believe it needs to be re-evaluated and monitored in healing process, can consider [facility] if need to be seen sooner  Diabetes Type 2 A1c 8.4 last checked, by Endocrinology, currently managing Insulin level has been adjusted previously by Endocrine No changes today  Anxiety Reports use of Alprazolam for anxiety, which  also improves breathing difficulty and stuttering. -Refill Alprazolam 0.5mg , take half or whole tab,  30 tablets, to be taken as needed.         Orders Placed This Encounter  Procedures   Ambulatory referral to Neurology    Referral Priority:   Routine    Referral Type:   Consultation    Referral Reason:   Specialty Services Required    Requested Specialty:   Neurology    Number of Visits Requested:   1    Meds ordered this encounter  Medications   ALPRAZolam (XANAX) 0.5 MG tablet    Sig: Take 1 tablet (0.5 mg total) by mouth 2 (two) times daily as needed for anxiety. May start with half pill for 0.25mg  dose. May increase in future if needed.    Dispense:  30 tablet    Refill:  2    Follow up plan: Return if symptoms worsen or fail to improve.   Saralyn Pilar, DO [facility] 02/13/2024, 3:40 PM

## 2024-02-14 ENCOUNTER — Ambulatory Visit: Payer: Self-pay | Admitting: Family Medicine

## 2024-02-14 NOTE — Telephone Encounter (Signed)
 Copied from CRM [id number]. Topic: Clinical - Red Word Triage >> Feb 14, 2024  1:52 PM Elle L wrote: Red Word that prompted transfer to Nurse Triage: The patient's wife, Hartwell Vandiver, states that the patient was seen in the office yesterday for his skin as he has had surgery on it and the stitches are open and seems infected. She states it has gotten worse since yesterday and they are unable to get in contact with the skin center as advised and she is unsure what to do.   Chief Complaint: Wound dehiscence  Symptoms: Open wound on face Frequency: Constant  Pertinent Negatives: Patient denies fever or pain  Disposition: [] ED /[] Urgent Care (no appt availability in office) / [x] Appointment(In office/virtual)/ []  [facility]/ [] Home Care/ [] Refused Recommended Disposition /[] [facility]/ []  Follow-up with PCP Additional Notes: Patient's wife called to report that the patient was seen by Dr. Althea Charon yesterday due to the sutures for her recent surgical wound popped. She states that she was advised to contact the skin center for follow-up but states she has not been able to reach them today. Patient denies any fever or pain to the wound. I discussed follow up with the patient's wife who will contact her dermatologist to have him evaluated.      Reason for Disposition  [1] After 14 days AND [2] wound isn't healed    Will contact dermatologist for appointment  Answer Assessment - Initial Assessment Questions 1. LOCATION: "Where is the wound located?"      Right cheek 2. WOUND APPEARANCE: "What does the wound look like?"      Wide open and very red  3. SIZE: If redness is present, ask: "What is the size of the red area?" (Inches, centimeters, or compare to size of a coin)      No surrounding  4. SPREAD: "What's changed in the last day?"  "Do you see any red streaks coming from the wound?"     Unsure  5. ONSET: "When did it start to look infected?"      Last week  6.  MECHANISM: "How did the wound start, what was the cause?"     Skin lesion removal  7. PAIN: Do you have any pain?"  If Yes, ask: "How bad is the pain?"  (e.g., Scale 1-10; mild, moderate, or severe)    - MILD (1-3): Doesn't interfere with normal activities.     - MODERATE (4-7): Interferes with normal activities or awakens from sleep.    - SEVERE (8-10): Excruciating pain, unable to do any normal activities.       No 8. FEVER: "Do you have a fever?" If Yes, ask: "What is your temperature, how was it measured, and when did it start?"     No 9. OTHER SYMPTOMS: "Do you have any other symptoms?" (e.g., shaking chills, weakness, rash elsewhere on body)     No  Protocols used: Wound Infection Suspected-A-AH

## 2024-02-14 NOTE — Telephone Encounter (Signed)
 Spoke with patient, the dermatology office called while I was speaking with him. We disconnected so that patient could speak with them, patient will call our office if we need to do anything further.

## 2024-02-23 ENCOUNTER — Other Ambulatory Visit: Payer: Self-pay | Admitting: Family Medicine

## 2024-02-23 ENCOUNTER — Ambulatory Visit

## 2024-02-23 DIAGNOSIS — Z23 Encounter for immunization: Secondary | ICD-10-CM

## 2024-02-23 DIAGNOSIS — M7551 Bursitis of right shoulder: Secondary | ICD-10-CM

## 2024-02-23 DIAGNOSIS — G8929 Other chronic pain: Secondary | ICD-10-CM

## 2024-02-23 MED ORDER — BACLOFEN 10 MG PO TABS: 5.0000 mg | ORAL_TABLET | Freq: Every evening | ORAL | 2 refills | Status: DC | PRN

## 2024-03-10 IMAGING — CR DG CHEST 2V
1 series · 2 of 2 positions shown · non-contrast
Comparison: 03/09/2021

CLINICAL DATA: Shortness of breath.

EXAM:
CHEST - 2 VIEW

[Series 1: dg chest 2 view · 0.14mm/px · 2 of 2 slices shown]
[im 1/2]
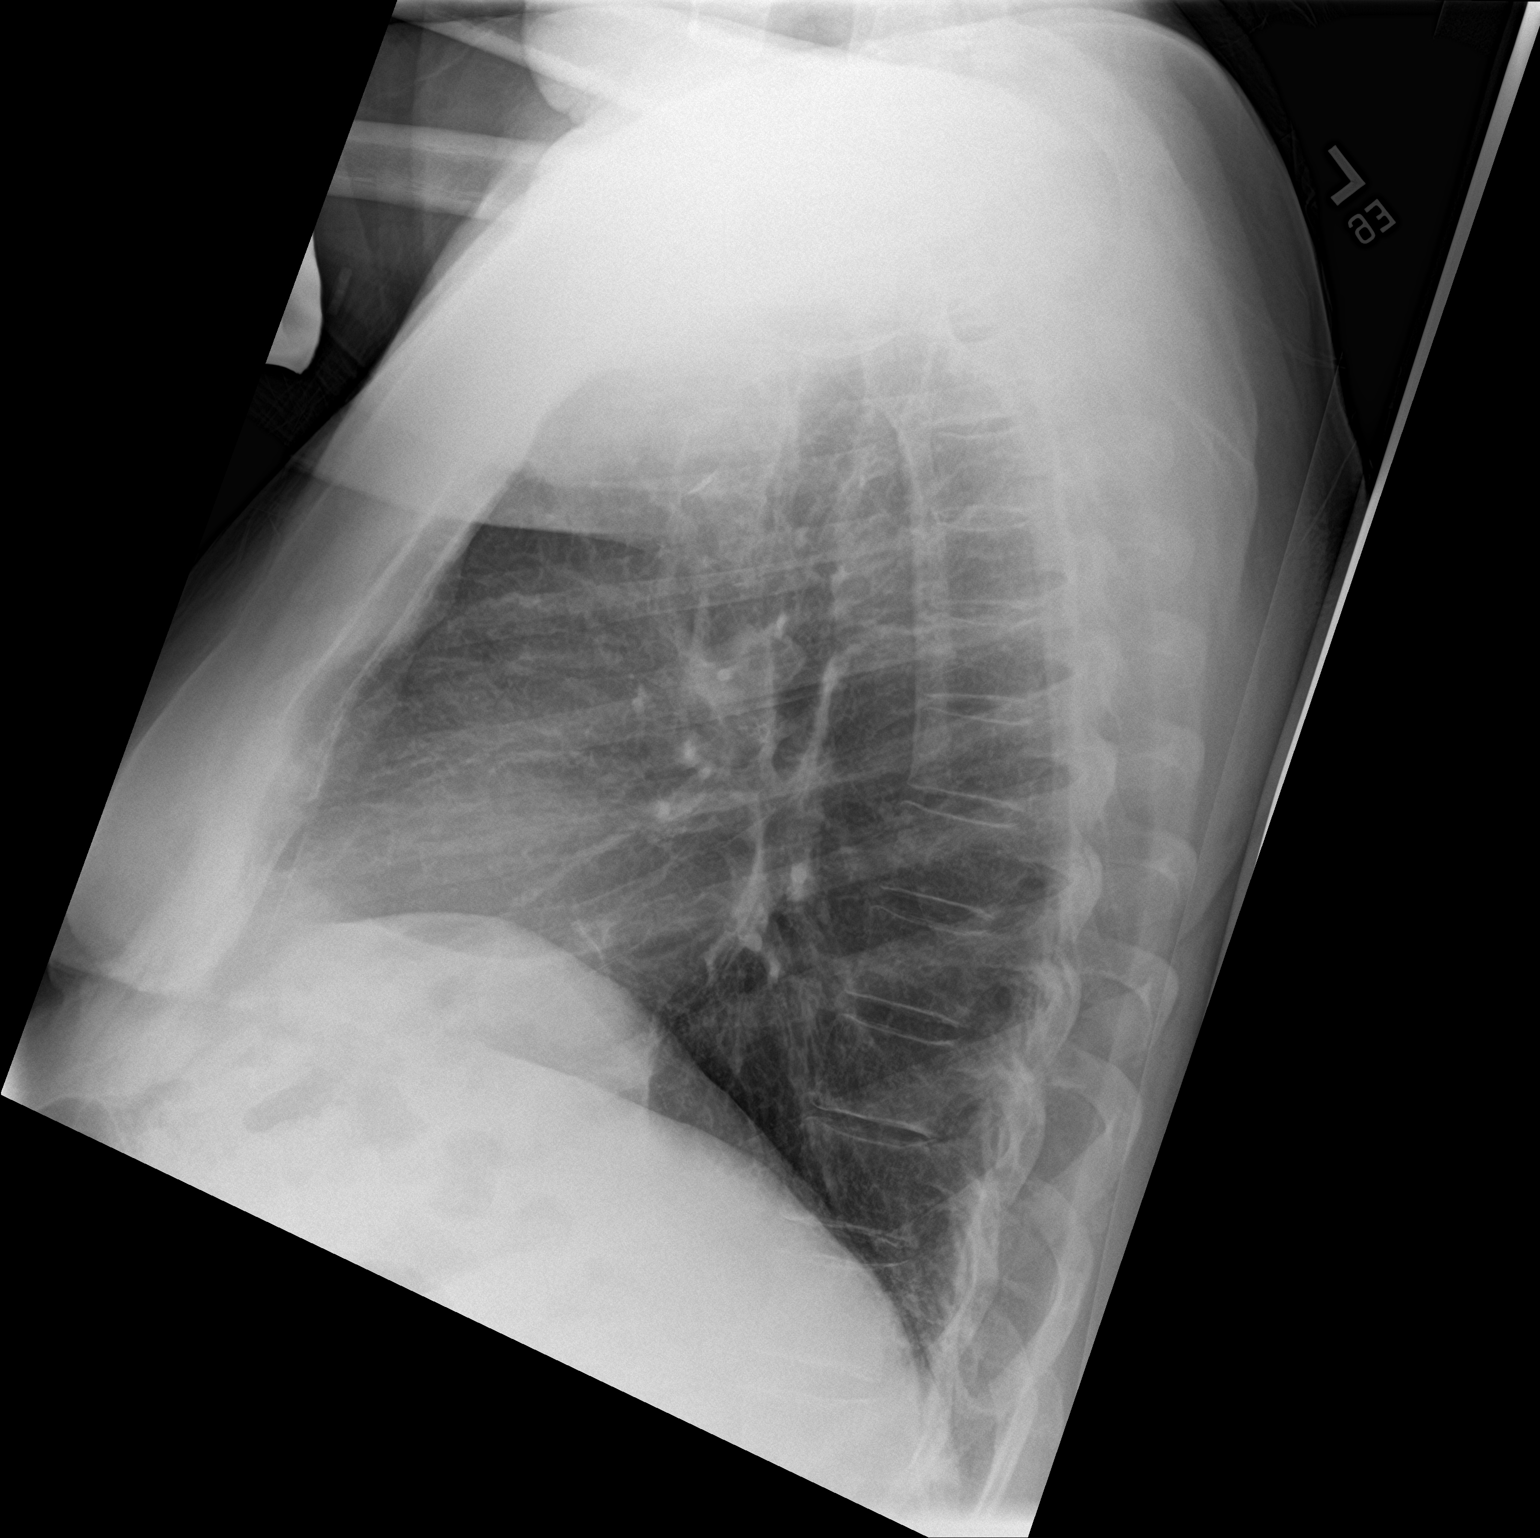
[im 2/2]
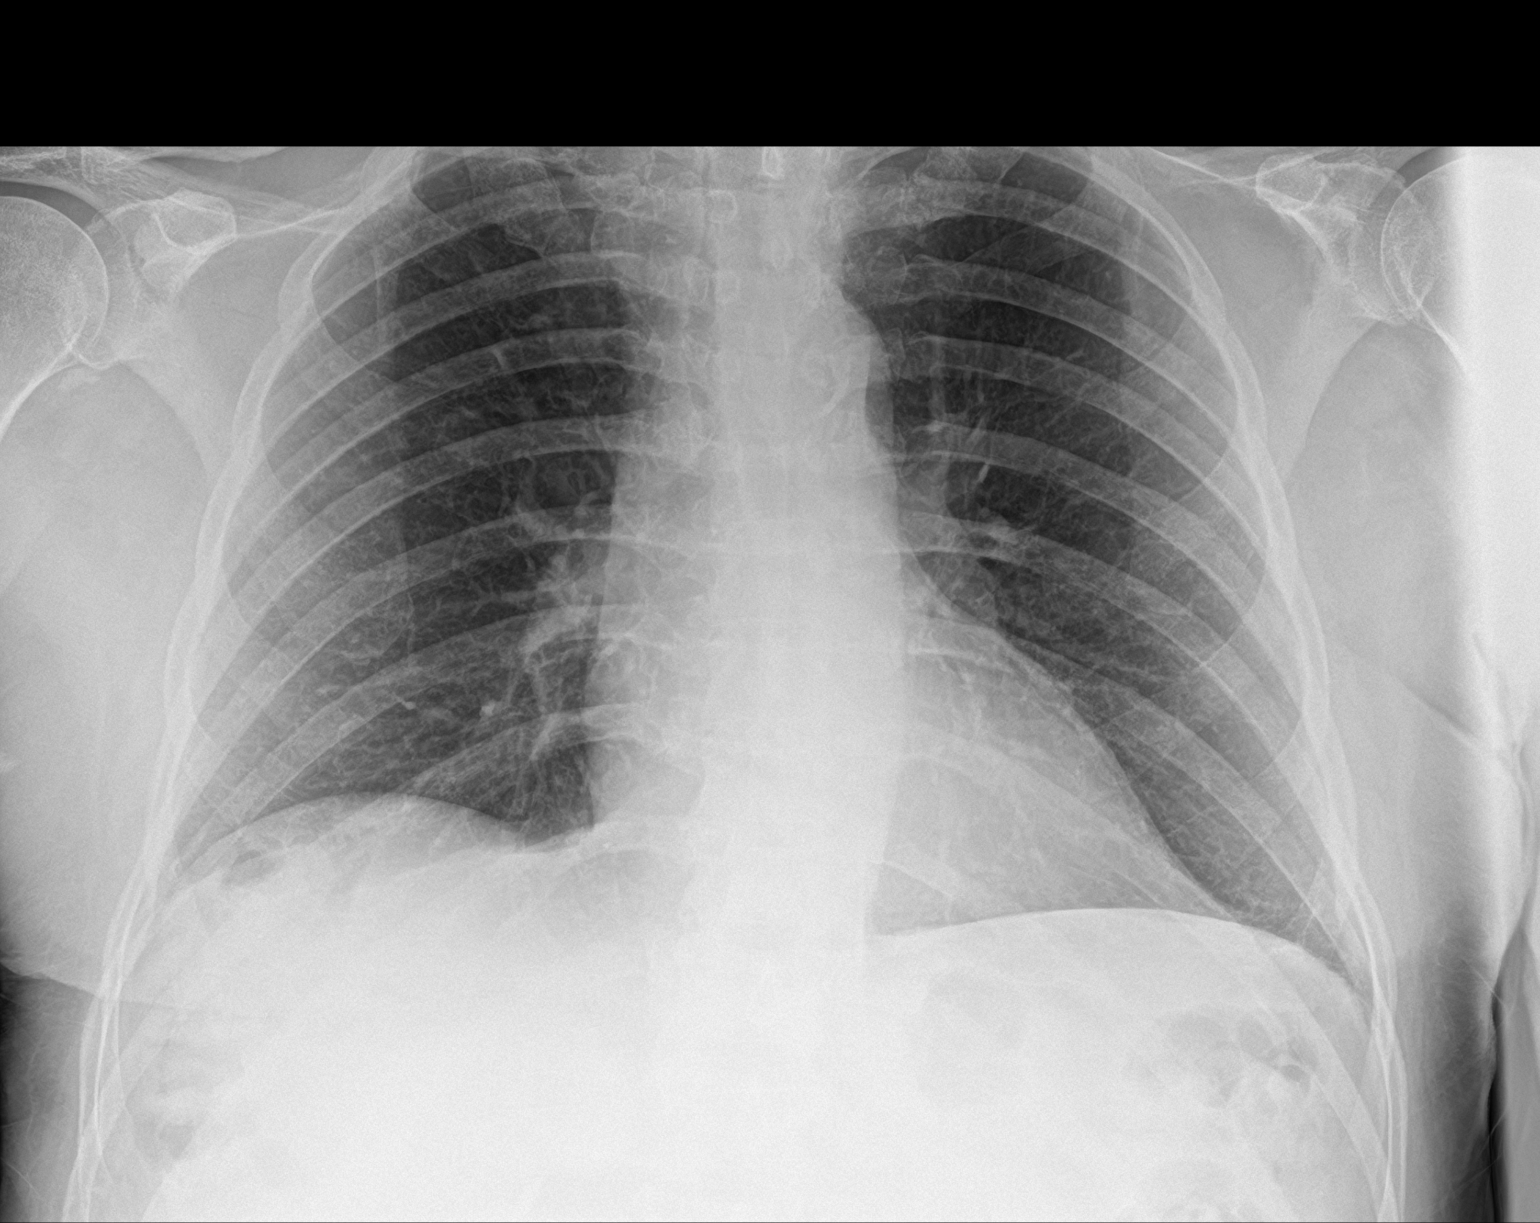

[2 of 2 positions shown; findings below may reference images not displayed]

FINDINGS: The lungs are clear without focal pneumonia, edema, pneumothorax or
pleural effusion. The cardiopericardial silhouette is within normal
limits for size. The visualized bony structures of the thorax are
unremarkable.
IMPRESSION: No active cardiopulmonary disease.

## 2024-03-10 IMAGING — NM NM PULMONARY PERF PARTICULATE
1 series · 8 of 8 positions shown · non-contrast
Comparison: Chest x-ray April 05, 2022

CLINICAL DATA: Shortness of breath.

EXAM:
NUCLEAR MEDICINE PERFUSION LUNG SCAN
TECHNIQUE: Perfusion images were obtained in multiple projections after
intravenous injection of radiopharmaceutical.
Ventilation scans intentionally deferred if perfusion scan and chest
x-ray adequate for interpretation during COVID 19 epidemic.
RADIOPHARMACEUTICALS:  4.36 mCi Dc-YYm MAA IV

[Series 1000: lung perfusion · 1.65mm/px · 4 acquisitions, 8 frames shown]
[im 1/4]
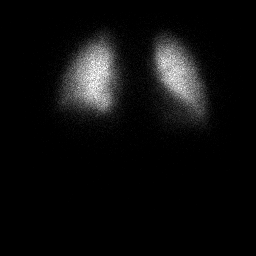
[im 1/4]
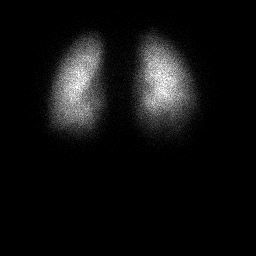
[im 2/4]
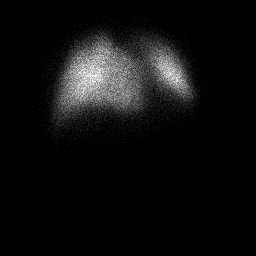
[im 2/4]
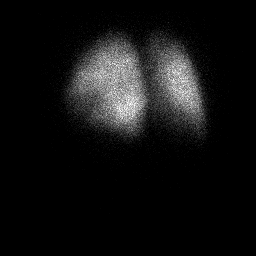
[im 3/4  full-range]
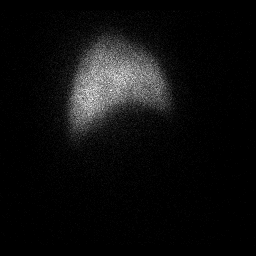
[im 3/4  full-range]
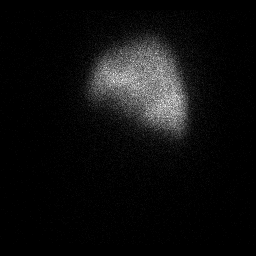
[im 4/4]
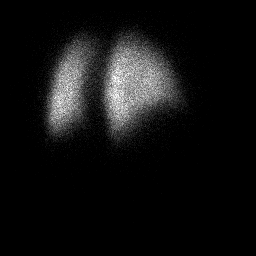
[im 4/4]
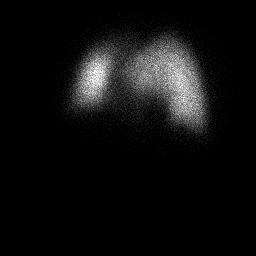

[8 of 8 positions shown; findings below may reference images not displayed]

FINDINGS: The perfusion scan is normal without filling defect.
IMPRESSION: Normal perfusion scan.

## 2024-03-22 ENCOUNTER — Emergency Department

## 2024-03-22 ENCOUNTER — Observation Stay
Admission: EM | Admit: 2024-03-22 | Discharge: 2024-03-23 | Disposition: A | Discharge status: Home / Self Care | Attending: Osteopathic Medicine | Admitting: Osteopathic Medicine

## 2024-03-22 DIAGNOSIS — Z79899 Other long term (current) drug therapy: Secondary | ICD-10-CM | POA: Insufficient documentation

## 2024-03-22 DIAGNOSIS — E1122 Type 2 diabetes mellitus with diabetic chronic kidney disease: Secondary | ICD-10-CM | POA: Diagnosis not present

## 2024-03-22 DIAGNOSIS — E669 Obesity, unspecified: Secondary | ICD-10-CM | POA: Diagnosis present

## 2024-03-22 DIAGNOSIS — F419 Anxiety disorder, unspecified: Secondary | ICD-10-CM | POA: Diagnosis present

## 2024-03-22 DIAGNOSIS — J45909 Unspecified asthma, uncomplicated: Secondary | ICD-10-CM | POA: Diagnosis not present

## 2024-03-22 DIAGNOSIS — R42 Dizziness and giddiness: Secondary | ICD-10-CM | POA: Diagnosis present

## 2024-03-22 DIAGNOSIS — Z7982 Long term (current) use of aspirin: Secondary | ICD-10-CM | POA: Diagnosis not present

## 2024-03-22 DIAGNOSIS — J4520 Mild intermittent asthma, uncomplicated: Secondary | ICD-10-CM

## 2024-03-22 DIAGNOSIS — I129 Hypertensive chronic kidney disease with stage 1 through stage 4 chronic kidney disease, or unspecified chronic kidney disease: Secondary | ICD-10-CM

## 2024-03-22 DIAGNOSIS — Z794 Long term (current) use of insulin: Secondary | ICD-10-CM | POA: Diagnosis not present

## 2024-03-22 DIAGNOSIS — Z1152 Encounter for screening for COVID-19: Secondary | ICD-10-CM | POA: Diagnosis not present

## 2024-03-22 DIAGNOSIS — I130 Hypertensive heart and chronic kidney disease with heart failure and stage 1 through stage 4 chronic kidney disease, or unspecified chronic kidney disease: Secondary | ICD-10-CM | POA: Insufficient documentation

## 2024-03-22 DIAGNOSIS — I10 Essential (primary) hypertension: Secondary | ICD-10-CM | POA: Diagnosis present

## 2024-03-22 DIAGNOSIS — I5032 Chronic diastolic (congestive) heart failure: Secondary | ICD-10-CM | POA: Diagnosis not present

## 2024-03-22 DIAGNOSIS — N390 Urinary tract infection, site not specified: Secondary | ICD-10-CM | POA: Diagnosis not present

## 2024-03-22 DIAGNOSIS — R531 Weakness: Principal | ICD-10-CM

## 2024-03-22 DIAGNOSIS — E66811 Obesity, class 1: Secondary | ICD-10-CM | POA: Diagnosis not present

## 2024-03-22 DIAGNOSIS — Z85828 Personal history of other malignant neoplasm of skin: Secondary | ICD-10-CM | POA: Diagnosis not present

## 2024-03-22 DIAGNOSIS — Z6830 Body mass index (BMI) 30.0-30.9, adult: Secondary | ICD-10-CM | POA: Diagnosis not present

## 2024-03-22 DIAGNOSIS — N184 Chronic kidney disease, stage 4 (severe): Secondary | ICD-10-CM | POA: Diagnosis not present

## 2024-03-22 DIAGNOSIS — Z87891 Personal history of nicotine dependence: Secondary | ICD-10-CM | POA: Diagnosis not present

## 2024-03-22 DIAGNOSIS — E1140 Type 2 diabetes mellitus with diabetic neuropathy, unspecified: Secondary | ICD-10-CM

## 2024-03-22 LAB — URINALYSIS, ROUTINE W REFLEX MICROSCOPIC
Bilirubin Urine: NEGATIVE
Glucose, UA: >=500 mg/dL — AB
Hgb urine dipstick: NEGATIVE
Ketones, ur: 5 mg/dL — AB
Nitrite: NEGATIVE
Protein, ur: 100 mg/dL — AB
Specific Gravity, Urine: 1.014 (ref 1.005–1.030)
pH: 5 (ref 5.0–8.0)

## 2024-03-22 LAB — BASIC METABOLIC PANEL WITH GFR
Anion gap: 11 (ref 5–15)
BUN: 45 mg/dL — ABNORMAL HIGH (ref 8–23)
CO2: 21 mmol/L — ABNORMAL LOW (ref 22–32)
Calcium: 8.8 mg/dL — ABNORMAL LOW (ref 8.9–10.3)
Chloride: 106 mmol/L (ref 98–111)
Creatinine, Ser: 2.6 mg/dL — ABNORMAL HIGH (ref 0.61–1.24)
GFR, Estimated: 23 mL/min — ABNORMAL LOW (ref >60)
Glucose, Bld: 268 mg/dL — ABNORMAL HIGH (ref 70–99)
Potassium: 4.9 mmol/L (ref 3.5–5.1)
Sodium: 138 mmol/L (ref 135–145)

## 2024-03-22 LAB — TROPONIN I (HIGH SENSITIVITY): Troponin I (High Sensitivity): 9 ng/L (ref <18)

## 2024-03-22 LAB — RESP PANEL BY RT-PCR (RSV, FLU A&B, COVID)  RVPGX2
Influenza A by PCR: NEGATIVE
Influenza B by PCR: NEGATIVE
Resp Syncytial Virus by PCR: NEGATIVE
SARS Coronavirus 2 by RT PCR: NEGATIVE

## 2024-03-22 LAB — CBC
HCT: 42.3 % (ref 39.0–52.0)
Hemoglobin: 14 g/dL (ref 13.0–17.0)
MCH: 31.7 pg (ref 26.0–34.0)
MCHC: 33.1 g/dL (ref 30.0–36.0)
MCV: 95.9 fL (ref 80.0–100.0)
Platelets: 237 10*3/uL (ref 150–400)
RBC: 4.41 MIL/uL (ref 4.22–5.81)
RDW: 12.4 % (ref 11.5–15.5)
WBC: 12.1 10*3/uL — ABNORMAL HIGH (ref 4.0–10.5)
nRBC: 0 % (ref 0.0–0.2)

## 2024-03-22 LAB — BRAIN NATRIURETIC PEPTIDE: B Natriuretic Peptide: 87.4 pg/mL (ref 0.0–100.0)

## 2024-03-22 LAB — LACTIC ACID, PLASMA: Lactic Acid, Venous: 1.3 mmol/L (ref 0.5–1.9)

## 2024-03-22 LAB — CBG MONITORING, ED: Glucose-Capillary: 320 mg/dL — ABNORMAL HIGH (ref 70–99)

## 2024-03-22 MED ORDER — ENOXAPARIN SODIUM 30 MG/0.3ML IJ SOSY
30.0000 mg | PREFILLED_SYRINGE | INTRAMUSCULAR | Status: DC
  Administered 2024-03-22: 30 mg via SUBCUTANEOUS
  Filled 2024-03-22: qty 0.3

## 2024-03-22 MED ORDER — INSULIN ASPART 100 UNIT/ML IJ SOLN
0.0000 [IU] | Freq: Three times a day (TID) | INTRAMUSCULAR | Status: DC
  Administered 2024-03-23: 3 [IU] via SUBCUTANEOUS
  Filled 2024-03-22: qty 1

## 2024-03-22 MED ORDER — ONDANSETRON HCL 4 MG/2ML IJ SOLN: 4.0000 mg | Freq: Three times a day (TID) | INTRAMUSCULAR | Status: DC | PRN

## 2024-03-22 MED ORDER — ALBUTEROL SULFATE (2.5 MG/3ML) 0.083% IN NEBU: 3.0000 mL | INHALATION_SOLUTION | RESPIRATORY_TRACT | Status: DC | PRN

## 2024-03-22 MED ORDER — INSULIN ASPART 100 UNIT/ML IJ SOLN
0.0000 [IU] | Freq: Every day | INTRAMUSCULAR | Status: DC
  Administered 2024-03-22: 4 [IU] via SUBCUTANEOUS
  Filled 2024-03-22: qty 1

## 2024-03-22 MED ORDER — INSULIN GLARGINE-YFGN 100 UNIT/ML ~~LOC~~ SOLN
10.0000 [IU] | Freq: Every day | SUBCUTANEOUS | Status: DC
  Administered 2024-03-22: 10 [IU] via SUBCUTANEOUS
  Filled 2024-03-22 (×3): qty 0.1

## 2024-03-22 MED ORDER — MECLIZINE HCL 25 MG PO TABS
25.0000 mg | ORAL_TABLET | Freq: Once | ORAL | Status: AC
  Administered 2024-03-22: 25 mg via ORAL
  Filled 2024-03-22: qty 1

## 2024-03-22 MED ORDER — DM-GUAIFENESIN ER 30-600 MG PO TB12: 1.0000 | ORAL_TABLET | Freq: Two times a day (BID) | ORAL | Status: DC | PRN

## 2024-03-22 MED ORDER — ACETAMINOPHEN 325 MG PO TABS: 650.0000 mg | ORAL_TABLET | Freq: Four times a day (QID) | ORAL | Status: DC | PRN

## 2024-03-22 MED ORDER — INSULIN DEGLUDEC 100 UNIT/ML ~~LOC~~ SOPN: 10.0000 [IU] | PEN_INJECTOR | Freq: Every day | SUBCUTANEOUS | Status: DC

## 2024-03-22 MED ORDER — SODIUM CHLORIDE 0.9 % IV SOLN: Freq: Once | INTRAVENOUS | Status: AC

## 2024-03-22 MED ORDER — SODIUM CHLORIDE 0.9 % IV SOLN
1.0000 g | INTRAVENOUS | Status: DC
  Administered 2024-03-22: 1 g via INTRAVENOUS
  Filled 2024-03-22: qty 10

## 2024-03-22 MED ORDER — GABAPENTIN 300 MG PO CAPS: 300.0000 mg | ORAL_CAPSULE | Freq: Every evening | ORAL | Status: DC | PRN

## 2024-03-22 MED ORDER — ALPRAZOLAM 0.5 MG PO TABS: 0.5000 mg | ORAL_TABLET | Freq: Two times a day (BID) | ORAL | Status: DC | PRN

## 2024-03-22 MED ORDER — HYDRALAZINE HCL 20 MG/ML IJ SOLN: 5.0000 mg | INTRAMUSCULAR | Status: DC | PRN

## 2024-03-22 MED ORDER — LOSARTAN POTASSIUM 50 MG PO TABS
50.0000 mg | ORAL_TABLET | Freq: Every day | ORAL | Status: DC
  Administered 2024-03-23: 50 mg via ORAL
  Filled 2024-03-22: qty 1

## 2024-03-22 MED ORDER — SODIUM CHLORIDE 0.9 % IV SOLN
1.0000 g | Freq: Once | INTRAVENOUS | Status: DC
  Filled 2024-03-22: qty 10

## 2024-03-22 MED ORDER — ASPIRIN 81 MG PO TBEC
81.0000 mg | DELAYED_RELEASE_TABLET | Freq: Every day | ORAL | Status: DC
  Administered 2024-03-22 – 2024-03-23 (×2): 81 mg via ORAL
  Filled 2024-03-22 (×2): qty 1

## 2024-03-22 MED ORDER — FAMOTIDINE 20 MG PO TABS: 10.0000 mg | ORAL_TABLET | Freq: Every day | ORAL | Status: DC | PRN

## 2024-03-22 NOTE — ED Notes (Signed)
 Attempted to ambulate patient in the room. Patient tolerated sitting on edge of bed well. However, upon standing and taking around two steps patient stated he was dizzy and was ataxic. Patient was placed back in bed with call bell within reach. Patient is tolerating fluids well at this time no complaints of nausea.

## 2024-03-22 NOTE — ED Triage Notes (Signed)
 First Nurse Note;  Pt via [facility] from home. Pt c/o dizziness and weakness that started 0900 this AM. Report NV, yellowish color. Negative stroke screen. Pt is A&Ox4 and NAD 171/73 BP 57 SB o 99% on RA 230 CBG with hx of DM.

## 2024-03-22 NOTE — ED Provider Notes (Signed)
 [facility] Provider Note    Event Date/Time   First MD Initiated Contact with Patient 03/22/24 1510     (approximate)   History   Dizziness   HPI  Stephen White is a 88 y.o. male who presents to the emergency department today because of concerns for dizziness and nausea.  Patient first noticed the symptoms this morning when he was trying to go to the restroom.  He wound up and lying down on the floor of the rest because he felt it was not safe for him to try to ambulate back to bed.  EMS was called out and did help transfer him back to bed and at that time patient declined transfer to the emergency department.  However the dizziness continued throughout the day.  He does describe it as some sensation of the room spinning around him.  Patient did have a slight headache with this.  Denies any chest pain or palpitations.  Says that he was told he had vertigo a number of years ago.     Physical Exam   Triage Vital Signs: ED Triage Vitals  Encounter Vitals Group     BP 03/22/24 1211 (!) 173/65     Systolic BP Percentile --      Diastolic BP Percentile --      Pulse Rate 03/22/24 1211 (!) 58     Resp 03/22/24 1211 17     Temp 03/22/24 1211 (!) 97.5 F (36.4 C)     Temp Source 03/22/24 1211 Oral     SpO2 03/22/24 1211 100 %     Weight 03/22/24 1213 188 lb 0.8 oz (85.3 kg)     Height 03/22/24 1213 5\' 6"  (1.676 m)     Head Circumference --      Peak Flow --      Pain Score 03/22/24 1213 0     Pain Loc --      Pain Education --      Exclude from Growth Chart --     Most recent vital signs: Vitals:   03/22/24 1211  BP: (!) 173/65  Pulse: (!) 58  Resp: 17  Temp: (!) 97.5 F (36.4 C)  SpO2: 100%   General: Awake, alert, oriented. CV:  Good peripheral perfusion. Bradycardia, regular rhythm. Resp:  Normal effort. Lungs clear. Abd:  No distention.  Other:  PERRL. EOMI. Face symmetric, tongue midline. Grip strength 5/5 in upper extremities. Strength  5/5 in lower extremities. Sensation grossly intact.    ED Results / Procedures / Treatments   Labs (all labs ordered are listed, but only abnormal results are displayed) Labs Reviewed  BASIC METABOLIC PANEL WITH GFR - Abnormal; Notable for the following components:      Result Value   CO2 21 (*)    Glucose, Bld 268 (*)    BUN 45 (*)    Creatinine, Ser 2.60 (*)    Calcium 8.8 (*)    GFR, Estimated 23 (*)    All other components within normal limits  CBC - Abnormal; Notable for the following components:   WBC 12.1 (*)    All other components within normal limits  RESP PANEL BY RT-PCR (RSV, FLU A&B, COVID)  [id number]  LACTIC ACID, PLASMA  URINALYSIS, ROUTINE W REFLEX MICROSCOPIC  CBG MONITORING, ED     EKG  I, Phineas Semen, attending physician, personally viewed and interpreted this EKG  EKG Time: 1221 Rate: 56 Rhythm: sinus bradycardia Axis: normal Intervals: qtc  414 QRS: narrow ST changes: no st elevation Impression: abnormal ekg   RADIOLOGY I independently interpreted and visualized the MR brain. My interpretation: No stroke Radiology interpretation:  IMPRESSION:  1. No acute intracranial abnormality.  2. Moderate chronic small vessel ischemic disease and cerebral  atrophy.     PROCEDURES:  Critical Care performed: No    MEDICATIONS ORDERED IN ED: Medications - No data to display   IMPRESSION / MDM / ASSESSMENT AND PLAN / ED COURSE  I reviewed the triage vital signs and the nursing notes.                              Differential diagnosis includes, but is not limited to, anemia, electrolyte abnormality, arrhythmia, vertigo, CVA  Patient's presentation is most consistent with acute presentation with potential threat to life or bodily function.   The patient is on the cardiac monitor to evaluate for evidence of arrhythmia and/or significant heart rate changes.  Patient presented to the emergency department today because of concerns for  dizziness, nausea.  No focal neurodeficits on exam.  Given history of vertigo I do think vertigo likely.  Will try meclizine.  However given age will check MR brain as well.    MRI of the brain without evidence for stroke.  Patient did not have any significant relief from meclizine and continue to be quite unstable and he attempted ambulation here with nursing staff.  Patient's UA is concerning for infection.  At this time we will plan on starting IV antibiotics.  Discussed with Dr. Clyde Lundborg with the hospitalist service who will evaluate for admission.   FINAL CLINICAL IMPRESSION(S) / ED DIAGNOSES   Final diagnoses:  Weakness  Dizziness  Lower urinary tract infectious disease     Note:  This document was prepared using Dragon voice recognition software and may include unintentional dictation errors.    Phineas Semen, MD 03/22/24 2155

## 2024-03-22 NOTE — ED Triage Notes (Signed)
 Pt here with weakness since this morning. Pt states he is still dizzy and cold. Pt denies sob or cp. Pt denies being sick. Pt has a hx of DM.

## 2024-03-22 NOTE — ED Notes (Signed)
 CCMD called and placed pt on monitor

## 2024-03-22 NOTE — H&P (Signed)
 History and Physical    Stephen White JXB:[id number] DOB: [birth year] DOA: 03/22/2024  Referring MD/NP/PA:   PCP: Smitty Cords, DO   Patient coming from:  The patient is coming from home.     Chief Complaint: dizziness and burning on urination  HPI: Stephen White is a 88 y.o. male with medical history significant of vertigo, hypertension, hyperlipidemia, diabetes mellitus, asthma, diastolic CHF, GERD, anxiety, CKD-4, hearing loss, skin cancer, obesity, who presents with dizziness, burning with urination.  Patient states that he started having dizziness since this morning, feeling room spinning around him.  No unilateral numbness or tingling in extremities.  No facial droop or slurred speech.  Patient has generalized weakness.  No fall.  Denies chest pain, cough, SOB.  Patient has nausea and vomited a little bit today, no diarrhea or abdominal pain.  Patient reports intermittent burning on urination, no dysuria or hematuria.  Data reviewed independently and ED Course: pt was found to have WBC 12.1, UA (clear appearance, moderate amount of leukocyte, rare bacteria, WBC 11-20), negative PCR for COVID, flu and RSV, renal function close to baseline, lactic acid 1.3.  Temperature normal, blood pressure 118/91, heart rate 58, 70, 18, oxygen saturation 100% on room air.  MRI of the brain is negative for stroke.  Patient is placed in telemetry bed for observation.   EKG: I have personally reviewed.  Sinus rhythm, QTc 414, low voltage, nonspecific T wave change.   Review of Systems:   General: no fevers, chills, no body weight gain, has fatigue HEENT: no blurry vision, hearing changes or sore throat Respiratory: no dyspnea, coughing, wheezing CV: no chest pain, no palpitations GI: no nausea, vomiting, abdominal pain, diarrhea, constipation GU: no dysuria, has burning on urination, no increased urinary frequency, hematuria  Ext: no leg edema Neuro: no unilateral weakness, numbness,  or tingling, no vision change or hearing loss.  Has dizziness Skin: no rash, no skin tear. MSK: No muscle spasm, no deformity, no limitation of range of movement in spin Heme: No easy bruising.  Travel history: No recent long distant travel.   Allergy: No Known Allergies  Past Medical History:  Diagnosis Date   Actinic keratosis    Allergy    Basal cell carcinoma 08/29/2018   L post inf ear/excision   Basal cell carcinoma 08/05/2020   L lat forehead above the brow - [facility]   Chronic kidney disease    GERD (gastroesophageal reflux disease)    Hyperlipidemia    Hypertension    SCC (squamous cell carcinoma) 12/22/2023   R zygoma, MOHs completed 01/24/2024   Squamous cell carcinoma of skin 03/26/2015   L dorsal hand   Squamous cell carcinoma of skin 06/17/2015   R cheek   Squamous cell carcinoma of skin 05/10/2019   R hand dorsum/in situ   Squamous cell carcinoma of skin 08/05/2020   R of mideline forehead - [facility]   Squamous cell carcinoma of skin 10/03/2023   R lat neck, [facility]   Thyroid disease    Type 2 diabetes mellitus (HCC)    Vertigo    Wears hearing aid in both ears     Past Surgical History:  Procedure Laterality Date   CATARACT EXTRACTION W/PHACO Left 04/03/2021   Procedure: CATARACT EXTRACTION PHACO AND INTRAOCULAR LENS PLACEMENT (IOC) LEFT DIABETIC 17.22 01:53.3 15.2%;  Surgeon: Lockie Mola, MD;  Location: [facility];  Service: Ophthalmology;  Laterality: Left;  Diabetic - insulin   EYE SURGERY  Social History:  reports that he quit smoking about 45 years ago. His smoking use included cigarettes. He started smoking about 70 years ago. He has a 7.5 pack-year smoking history. He has quit using smokeless tobacco. He reports current alcohol use. He reports that he does not use drugs.  Family History:  Family History  Problem Relation Age of Onset   Diabetes Mother    Diabetes Father    Stroke Father    Diabetes Brother    Diabetes Sister     Diabetes Sister    Diabetes Brother    Diabetes Brother    Diabetes Brother    Cancer Brother    Diabetes Brother      Prior to Admission medications   Medication Sig Start Date End Date Taking? Authorizing Provider  ALPRAZolam Prudy Feeler) 0.5 MG tablet Take 1 tablet (0.5 mg total) by mouth 2 (two) times daily as needed for anxiety. May start with half pill for 0.25mg  dose. May increase in future if needed. 02/13/24   Karamalegos, Netta Neat, DO  aspirin 81 MG EC tablet Take 81 mg by mouth daily    [provider]  baclofen (LIORESAL) 10 MG tablet Take 0.5-1 tablets (5-10 mg total) by mouth at bedtime as needed for muscle spasms. 02/23/24   Karamalegos, Netta Neat, DO  Blood Glucose Monitoring Suppl (ONETOUCH VERIO FLEX SYSTEM) w/Device KIT Use to check blood sugar up to twice daily as directed 01/12/23   Smitty Cords, DO  Carboxymethylcellulose Sodium (EYE DROPS OP) Apply to eye.    [provider]  Continuous Blood Gluc Receiver (FREESTYLE LIBRE 2 READER) DEVI by Does not apply route.    [provider]  Continuous Blood Gluc Sensor (FREESTYLE LIBRE 14 DAY SENSOR) MISC 1 kit. 06/17/20   [provider]  famotidine (PEPCID) 10 MG tablet Take 1 tablet (10 mg total) by mouth daily. Patient taking differently: Take 10 mg by mouth every other day. As needed 02/25/21   Smitty Cords, DO  gabapentin (NEURONTIN) 300 MG capsule Take 300 mg by mouth as needed. 02/22/22   [provider]  glucose blood (ONETOUCH VERIO) test strip Use to check blood sugar twice daily 01/12/23   Karamalegos, Alexander J, DO  GVOKE HYPOPEN 2-PACK 1 MG/0.2ML SOAJ Inject 1 mg into the skin as needed (hypoglycemia). 04/29/23   Karamalegos, Netta Neat, DO  hydrochlorothiazide (HYDRODIURIL) 12.5 MG tablet Take 1 tablet (12.5 mg total) by mouth daily. 10/18/23   Karamalegos, Netta Neat, DO  insulin aspart (NOVOLOG) 100 UNIT/ML FlexPen Inject 20 Units into the skin 2 (two)  times daily with a meal. 04/08/20   [provider]  insulin degludec (TRESIBA) 100 UNIT/ML FlexTouch Pen Inject 15 Units into the skin at bedtime. 02/28/23   [provider]  Lancets [facility] DELICA PLUS [place]) MISC Use to check blood sugar twice daily 01/12/23   Smitty Cords, DO  levalbuterol [facility] HFA) 45 MCG/ACT inhaler Inhale 1 puff into the lungs every 6 (six) hours as needed for wheezing. 04/30/22   Parrett, Virgel Bouquet, NP  losartan (COZAAR) 50 MG tablet Take 1 tablet (50 mg total) by mouth daily. 02/01/23   Karamalegos, Netta Neat, DO  Multiple Vitamins-Minerals (PX COMPLETE SENIOR MULTIVITS) TABS Take by mouth.    [provider]  SALINE MIST SPRAY NA Place into the nose.    [provider]  vitamin B-12 (CYANOCOBALAMIN) 500 MCG tablet Take 500 mcg by mouth daily.    [provider]    Physical Exam: Vitals:   03/22/24 1500 03/22/24 1530 03/22/24 1800 03/22/24 1927  BP: (!) 118/96 (!) 118/91  (!) 160/72  Pulse: 62 62 70 74  Resp: 18 18  18   Temp: 97.6 F (36.4 C)   97.9 F (36.6 C)  TempSrc: Oral   Oral  SpO2: 99% 99% 100% 95%  Weight:      Height:       General: Not in acute distress HEENT:       Eyes: PERRL, EOMI, no jaundice       ENT: No discharge from the ears and nose, no pharynx injection, no tonsillar enlargement.        Neck: No JVD, no bruit, no mass felt. Heme: No neck lymph node enlargement. Cardiac: S1/S2, RRR, No murmurs, No gallops or rubs. Respiratory: No rales, wheezing, rhonchi or rubs. GI: Soft, nondistended, nontender, no rebound pain, no organomegaly, BS present. GU: No hematuria Ext: No pitting leg edema bilaterally. 1+DP/PT pulse bilaterally. Musculoskeletal: No joint deformities, No joint redness or warmth, no limitation of ROM in spin. Skin: No rashes.  Neuro: Alert, oriented X3, cranial nerves II-XII grossly intact, moves all extremities normally. Psych: Patient is not psychotic, no  suicidal or hemocidal ideation.  Labs on Admission: I have personally reviewed following labs and imaging studies  CBC: Recent Labs  Lab 03/22/24 1215  WBC 12.1*  HGB 14.0  HCT 42.3  MCV 95.9  PLT 237   Basic Metabolic Panel: Recent Labs  Lab 03/22/24 1215  NA 138  K 4.9  CL 106  CO2 21*  GLUCOSE 268*  BUN 45*  CREATININE 2.60*  CALCIUM 8.8*   GFR: Estimated Creatinine Clearance: 21.3 mL/min (A) (by C-G formula based on SCr of 2.6 mg/dL (H)). Liver Function Tests: No results for input(s): "AST", "ALT", "ALKPHOS", "BILITOT", "PROT", "ALBUMIN" in the last 168 hours. No results for input(s): "LIPASE", "AMYLASE" in the last 168 hours. No results for input(s): "AMMONIA" in the last 168 hours. Coagulation Profile: No results for input(s): "INR", "PROTIME" in the last 168 hours. Cardiac Enzymes: No results for input(s): "CKTOTAL", "CKMB", "CKMBINDEX", "TROPONINI" in the last 168 hours. BNP (last 3 results) No results for input(s): "PROBNP" in the last 8760 hours. HbA1C: No results for input(s): "HGBA1C" in the last 72 hours. CBG: No results for input(s): "GLUCAP" in the last 168 hours. Lipid Profile: No results for input(s): "CHOL", "HDL", "LDLCALC", "TRIG", "CHOLHDL", "LDLDIRECT" in the last 72 hours. Thyroid Function Tests: No results for input(s): "TSH", "T4TOTAL", "FREET4", "T3FREE", "THYROIDAB" in the last 72 hours. Anemia Panel: No results for input(s): "VITAMINB12", "FOLATE", "FERRITIN", "TIBC", "IRON", "RETICCTPCT" in the last 72 hours. Urine analysis:    Component Value Date/Time   COLORURINE YELLOW (A) 03/22/2024 1218   APPEARANCEUR CLEAR (A) 03/22/2024 1218   LABSPEC 1.014 03/22/2024 1218   PHURINE 5.0 03/22/2024 1218   GLUCOSEU >=500 (A) 03/22/2024 1218   HGBUR NEGATIVE 03/22/2024 1218   BILIRUBINUR NEGATIVE 03/22/2024 1218   KETONESUR 5 (A) 03/22/2024 1218   PROTEINUR 100 (A) 03/22/2024 1218   NITRITE NEGATIVE 03/22/2024 1218   LEUKOCYTESUR MODERATE  (A) 03/22/2024 1218   Sepsis Labs: @LABRCNTIP (procalcitonin:4,lacticidven:4) ) Recent Results (from the past 240 hours)  Resp panel by RT-PCR (RSV, Flu A&B, Covid) Anterior Nasal Swab     Status: None   Collection Time: 03/22/24 12:18 PM   Specimen: Anterior Nasal Swab  Result Value Ref Range Status   SARS Coronavirus 2 by RT PCR NEGATIVE  NEGATIVE Final    Comment: (NOTE) SARS-CoV-2 target nucleic acids are NOT DETECTED.  The SARS-CoV-2 RNA is generally detectable in upper respiratory specimens during the acute phase of infection. The lowest concentration of SARS-CoV-2 viral copies this assay can detect is 138 copies/mL. A negative result does not preclude SARS-Cov-2 infection and should not be used as the sole basis for treatment or other patient management decisions. A negative result may occur with  improper specimen collection/handling, submission of specimen other than nasopharyngeal swab, presence of viral mutation(s) within the areas targeted by this assay, and inadequate number of viral copies(<138 copies/mL). A negative result must be combined with clinical observations, patient history, and epidemiological information. The expected result is Negative.  Fact Sheet for Patients:  BloggerCourse.com  Fact Sheet for Healthcare Providers:  SeriousBroker.it  This test is no t yet approved or cleared by the [place] FDA and  has been authorized for detection and/or diagnosis of SARS-CoV-2 by FDA under an Emergency Use Authorization (EUA). This EUA will remain  in effect (meaning this test can be used) for the duration of the COVID-19 declaration under Section 564(b)(1) of the Act, 21 U.S.C.section 360bbb-3(b)(1), unless the authorization is terminated  or revoked sooner.       Influenza A by PCR NEGATIVE NEGATIVE Final   Influenza B by PCR NEGATIVE NEGATIVE Final    Comment: (NOTE) The Xpert Xpress  SARS-CoV-2/FLU/RSV plus assay is intended as an aid in the diagnosis of influenza from Nasopharyngeal swab specimens and should not be used as a sole basis for treatment. Nasal washings and aspirates are unacceptable for Xpert Xpress SARS-CoV-2/FLU/RSV testing.  Fact Sheet for Patients: BloggerCourse.com  Fact Sheet for Healthcare Providers: SeriousBroker.it  This test is not yet approved or cleared by the [place] FDA and has been authorized for detection and/or diagnosis of SARS-CoV-2 by FDA under an Emergency Use Authorization (EUA). This EUA will remain in effect (meaning this test can be used) for the duration of the COVID-19 declaration under Section 564(b)(1) of the Act, 21 U.S.C. section 360bbb-3(b)(1), unless the authorization is terminated or revoked.     Resp Syncytial Virus by PCR NEGATIVE NEGATIVE Final    Comment: (NOTE) Fact Sheet for Patients: BloggerCourse.com  Fact Sheet for Healthcare Providers: SeriousBroker.it  This test is not yet approved or cleared by the [place] FDA and has been authorized for detection and/or diagnosis of SARS-CoV-2 by FDA under an Emergency Use Authorization (EUA). This EUA will remain in effect (meaning this test can be used) for the duration of the COVID-19 declaration under Section 564(b)(1) of the Act, 21 U.S.C. section 360bbb-3(b)(1), unless the authorization is terminated or revoked.  Performed at [facility], [address], [place], [place] [address]      Radiological Exams on Admission:   Assessment/Plan Principal Problem:   Vertigo Active Problems:   UTI (urinary tract infection)   Chronic diastolic CHF (congestive heart failure) (HCC)   HTN (hypertension)   Type 2 diabetes mellitus with diabetic chronic kidney disease (HCC)   CKD (chronic kidney disease), stage IV (HCC)   Asthma   Anxiety    Obesity (BMI 30-39.9)   Assessment and Plan:  Vertigo: MRI of the brain negative for stroke.  No focal neurodeficit on physical examination.  -Place in telemetry bed for observation -Fall precaution -Received IV fluid in ED -Vestibular TP  UTI (urinary tract infection) -IV Rocephin -follow-up urine culture  Chronic diastolic CHF (congestive heart failure) (HCC): 2D echo on  02/17/2022 showed EF> 55% with grade 1 diastolic dysfunction.  Patient does not have leg edema or JVD.  No SOB CHF is compensated. -Check BNP  HTN (hypertension) -IV hydralazine as needed -Continue Cozaar -Hold HCTZ temporarily due to vertigo  Type 2 diabetes mellitus with diabetic chronic kidney disease (HCC) recent A1c 8.7, poorly controlled.  Patient said taking NovoLog and Tresiba 15 units daily -Glargine insulin 10 units daily -SSI  CKD (chronic kidney disease), stage IV (HCC): Renal function close to baseline.  Recent baseline creatinine 2.3-2.6.  His creatinine is 2.6, BUN 45 and GFR 23. -Follow-up by BMP  Asthma: Stable, no wheezing -Bronchodilators and as needed Mucinex  Anxiety -Continue home as needed Xanax  Obesity (BMI 30-39.9): Body weight 85.3 kg, BMI 30.35 -Encourage losing weight -Exercise healthy diet      DVT ppx: SQ Lovenox  Code Status: Full code   Family Communication:  Yes, patient's daughter at bed side.     Disposition Plan:  Anticipate discharge back to previous environment  Consults called:  none  Admission status and Level of care: Telemetry Medical:    for obs     Dispo: The patient is from: Home              Anticipated d/c is to: Home              Anticipated d/c date is: 1 day              Patient currently is not medically stable to d/c.    Severity of Illness:  The appropriate patient status for this patient is OBSERVATION. Observation status is judged to be reasonable and necessary in order to provide the required intensity of service to ensure  the patient's safety. The patient's presenting symptoms, physical exam findings, and initial radiographic and laboratory data in the context of their medical condition is felt to place them at decreased risk for further clinical deterioration. Furthermore, it is anticipated that the patient will be medically stable for discharge from the hospital within 2 midnights of admission.        Date of Service 03/22/2024    Lorretta Harp [facility]   If 7PM-7AM, please contact night-coverage [email or web address] 03/22/2024, 8:34 PM

## 2024-03-22 NOTE — ED Provider Triage Note (Signed)
 Emergency Medicine Provider Triage Evaluation Note  Stephen White , a 88 y.o. male  was evaluated in triage.  Pt complains of being cold, vomiting, unknown if fever, no diarrhea, no cough or congestion.  Review of Systems  Positive:  Negative:   Physical Exam  BP (!) 173/65   Pulse (!) 58   Temp (!) 97.5 F (36.4 C) (Oral)   Resp 17   Ht 5\' 6"  (1.676 m)   Wt 85.3 kg   SpO2 100%   BMI 30.35 kg/m  Gen:   Awake, no distress   Resp:  Normal effort  MSK:   Moves extremities without difficulty  Other:  Pt shivering in exam room, warm blanket given  Medical Decision Making  Medically screening exam initiated at 12:22 PM.  Appropriate orders placed.  Bari Edward was informed that the remainder of the evaluation will be completed by another provider, this initial triage assessment does not replace that evaluation, and the importance of remaining in the ED until their evaluation is complete.     Faythe Ghee, PA-C 03/22/24 1223

## 2024-03-22 NOTE — ED Notes (Signed)
 Pt going to MRI

## 2024-03-23 ENCOUNTER — Other Ambulatory Visit: Payer: Self-pay

## 2024-03-23 DIAGNOSIS — R42 Dizziness and giddiness: Secondary | ICD-10-CM | POA: Diagnosis not present

## 2024-03-23 LAB — CBC
HCT: 36.8 % — ABNORMAL LOW (ref 39.0–52.0)
Hemoglobin: 12.4 g/dL — ABNORMAL LOW (ref 13.0–17.0)
MCH: 31.3 pg (ref 26.0–34.0)
MCHC: 33.7 g/dL (ref 30.0–36.0)
MCV: 92.9 fL (ref 80.0–100.0)
Platelets: 225 10*3/uL (ref 150–400)
RBC: 3.96 MIL/uL — ABNORMAL LOW (ref 4.22–5.81)
RDW: 12.4 % (ref 11.5–15.5)
WBC: 9.1 10*3/uL (ref 4.0–10.5)
nRBC: 0 % (ref 0.0–0.2)

## 2024-03-23 LAB — BASIC METABOLIC PANEL WITH GFR
Anion gap: 7 (ref 5–15)
BUN: 45 mg/dL — ABNORMAL HIGH (ref 8–23)
CO2: 24 mmol/L (ref 22–32)
Calcium: 8.5 mg/dL — ABNORMAL LOW (ref 8.9–10.3)
Chloride: 107 mmol/L (ref 98–111)
Creatinine, Ser: 2.57 mg/dL — ABNORMAL HIGH (ref 0.61–1.24)
GFR, Estimated: 24 mL/min — ABNORMAL LOW (ref >60)
Glucose, Bld: 216 mg/dL — ABNORMAL HIGH (ref 70–99)
Potassium: 4.3 mmol/L (ref 3.5–5.1)
Sodium: 138 mmol/L (ref 135–145)

## 2024-03-23 LAB — URINE CULTURE: Culture: <10000 — AB

## 2024-03-23 LAB — CBG MONITORING, ED
Glucose-Capillary: 216 mg/dL — ABNORMAL HIGH (ref 70–99)
Glucose-Capillary: 223 mg/dL — ABNORMAL HIGH (ref 70–99)

## 2024-03-23 MED ORDER — HYDROCHLOROTHIAZIDE 12.5 MG PO TABS: 12.5000 mg | ORAL_TABLET | Freq: Every day | ORAL | Status: DC

## 2024-03-23 MED ORDER — CEPHALEXIN 500 MG PO CAPS
500.0000 mg | ORAL_CAPSULE | Freq: Two times a day (BID) | ORAL | 0 refills | Status: DC
  Filled 2024-03-23: qty 14, 7d supply, fill #0

## 2024-03-23 NOTE — Discharge Summary (Signed)
 Physician Discharge Summary   Patient: Stephen White MRN: [id number]  DOB: [birth year]   Admit:     Date of Admission: 03/22/2024 Admitted from: home  Discharge: Date of discharge: 03/23/24 Disposition: Home Condition at discharge: good  CODE STATUS: FULL CODE     Discharge Physician: Sunnie Nielsen, DO [facility]     PCP: Smitty Cords, DO  Recommendations for Outpatient Follow-up:  Follow up with PCP Althea Charon, Netta Neat, DO in 2-4 weeks Please follow up on urine culture results     Discharge Instructions     Diet - low sodium heart healthy   Complete by: As directed    Diet Carb Modified   Complete by: As directed    Increase activity slowly   Complete by: As directed          Discharge Diagnoses: Principal Problem:   Vertigo Active Problems:   UTI (urinary tract infection)   Chronic diastolic CHF (congestive heart failure) (HCC)   HTN (hypertension)   Type 2 diabetes mellitus with diabetic chronic kidney disease (HCC)   CKD (chronic kidney disease), stage IV (HCC)   Asthma   Anxiety   Obesity (BMI 30-39.9)      Hospital course / significant events:   HPI:  Stephen White is a 88 y.o. male with medical history significant of vertigo, hypertension, hyperlipidemia, diabetes mellitus, asthma, diastolic CHF, GERD, anxiety, CKD-4, hearing loss, skin cancer, obesity, who presents with dizziness, burning with urination. Dizziness since morning 04/03, spinning sensation. (+)nausea, vomited x1, intermittent burning on urination.   04/03: VSS, no cocnerns on CBC other tham nild leukocytosis, BMP showed hyperglycemia 268 and Cr baseline 2.60. BNP, troponin, lactic all WNL. Neg flu/COVID/RSV. UA (+)mod leuks, WBC 11-20, epithelial cells 0-5, culture pending. MRI brain negative for CVA, admitted for observation.  04/04: PT eval this am pt did well, pt asking for discharge home. No concerns.      Consultants:   none  Procedures/Surgeries: none      ASSESSMENT & PLAN:   BPPV / Vertigo MRI of the brain negative for stroke.  No focal neurodeficit on physical examination. Resolved, Consider vestibular rehab follow up as needed   UTI (urinary tract infection) Keflex. follow-up urine culture   Chronic diastolic CHF (congestive heart failure) (HCC): 2D echo on 02/17/2022 showed EF> 55% with grade 1 diastolic dysfunction.  Patient does not have leg edema or JVD.  No SOB CHF is compensated.   HTN (hypertension) Continue Cozaar. Hold HCTZ based on BP    Type 2 diabetes mellitus with diabetic chronic kidney disease  recent A1c 8.7, poorly controlled.   Continue home meds and follow outpatient, stricter glc control to prevent infections    CKD (chronic kidney disease), stage IV Renal function close to baseline.  Recent baseline creatinine 2.3-2.6.  His creatinine is 2.6, BUN 45 and GFR 23. Follow-up by BMP   Asthma:  Stable, no wheezing  Anxiety Continue home as needed Xanax     Class 1 obesity based on BMI: Body mass index is 30.35 kg/m.  Underweight - under 18  overweight - 25 to 29 obese - 30 or more Class 1 obesity: BMI of 30.0 to 34 Class 2 obesity: BMI of 35.0 to 39 Class 3 obesity: BMI of 40.0 to 49 Super Morbid Obesity: BMI 50-59 Super-super Morbid Obesity: BMI 60+ Significantly low or high BMI is associated with higher medical risk.  Weight management advised as adjunct to other disease management  and risk reduction treatments       Discharge Instructions  Allergies as of 03/23/2024   No Known Allergies      Medication List     STOP taking these medications    baclofen 10 MG tablet Commonly known as: LIORESAL   insulin aspart 100 UNIT/ML FlexPen Commonly known as: NOVOLOG       TAKE these medications    ALPRAZolam 0.5 MG tablet Commonly known as: XANAX Take 1 tablet (0.5 mg total) by mouth 2 (two) times daily as needed for anxiety. May start with  half pill for 0.25mg  dose. May increase in future if needed.   aspirin EC 81 MG tablet Take 81 mg by mouth daily   cephALEXin 500 MG capsule Commonly known as: KEFLEX Take 1 capsule (500 mg total) by mouth 2 (two) times daily.   EYE DROPS OP Apply to eye.   famotidine 10 MG tablet Commonly known as: PEPCID Take 1 tablet (10 mg total) by mouth daily. What changed:  when to take this additional instructions   FreeStyle Libre 14 Day Sensor Misc 1 kit.   FreeStyle Libre 2 Reader [employer] by Does not apply route.   gabapentin 300 MG capsule Commonly known as: NEURONTIN Take 300 mg by mouth as needed.   Gvoke HypoPen 2-Pack 1 MG/0.2ML Soaj Generic drug: Glucagon Inject 1 mg into the skin as needed (hypoglycemia).   hydrochlorothiazide 12.5 MG tablet Commonly known as: HYDRODIURIL Take 1 tablet (12.5 mg total) by mouth daily. Hold this medication if your blood pressure is top number less than 120, or bottom number less than 80 What changed: additional instructions   insulin degludec 100 UNIT/ML FlexTouch Pen Commonly known as: TRESIBA Inject 15 Units into the skin at bedtime.   levalbuterol 45 MCG/ACT inhaler Commonly known as: Xopenex HFA Inhale 1 puff into the lungs every 6 (six) hours as needed for wheezing.   losartan 50 MG tablet Commonly known as: COZAAR Take 1 tablet (50 mg total) by mouth daily.   OneTouch Delica Plus Lancet30G Misc Use to check blood sugar twice daily   OneTouch Verio Flex System w/Device Kit Use to check blood sugar up to twice daily as directed   OneTouch Verio test strip Generic drug: glucose blood Use to check blood sugar twice daily   PX Complete Senior Multivits Tabs Take by mouth.   SALINE MIST SPRAY NA Place into the nose.   vitamin B-12 500 MCG tablet Commonly known as: CYANOCOBALAMIN Take 500 mcg by mouth daily.          No Known Allergies   Subjective: pt states he is feeling well and would like to go home. No  vertigo, no CP/SOB, ambulating well, he is a bit tired but otherwise no complaints. Tolerating diet.    Discharge Exam: BP (!) 153/60   Pulse 72   Temp 97.9 F (36.6 C) (Oral)   Resp 19   Ht 5\' 6"  (1.676 m)   Wt 85.3 kg   SpO2 96%   BMI 30.35 kg/m  General: Pt is alert, awake, not in acute distress Cardiovascular: RRR, S1/S2 +, no rubs, no gallops Respiratory: CTA bilaterally, no wheezing, no rhonchi Abdominal: Soft, NT, ND, bowel sounds + Extremities: no edema, no cyanosis     The results of significant diagnostics from this hospitalization (including imaging, microbiology, ancillary and laboratory) are listed below for reference.     Microbiology: Recent Results (from the past 240 hours)  Resp panel by RT-PCR (  RSV, Flu A&B, Covid) Anterior Nasal Swab     Status: None   Collection Time: 03/22/24 12:18 PM   Specimen: Anterior Nasal Swab  Result Value Ref Range Status   SARS Coronavirus 2 by RT PCR NEGATIVE NEGATIVE Final    Comment: (NOTE) SARS-CoV-2 target nucleic acids are NOT DETECTED.  The SARS-CoV-2 RNA is generally detectable in upper respiratory specimens during the acute phase of infection. The lowest concentration of SARS-CoV-2 viral copies this assay can detect is 138 copies/mL. A negative result does not preclude SARS-Cov-2 infection and should not be used as the sole basis for treatment or other patient management decisions. A negative result may occur with  improper specimen collection/handling, submission of specimen other than nasopharyngeal swab, presence of viral mutation(s) within the areas targeted by this assay, and inadequate number of viral copies(<138 copies/mL). A negative result must be combined with clinical observations, patient history, and epidemiological information. The expected result is Negative.  Fact Sheet for Patients:  BloggerCourse.com  Fact Sheet for Healthcare Providers:   SeriousBroker.it  This test is no t yet approved or cleared by the [place] FDA and  has been authorized for detection and/or diagnosis of SARS-CoV-2 by FDA under an Emergency Use Authorization (EUA). This EUA will remain  in effect (meaning this test can be used) for the duration of the COVID-19 declaration under Section 564(b)(1) of the Act, 21 U.S.C.section 360bbb-3(b)(1), unless the authorization is terminated  or revoked sooner.       Influenza A by PCR NEGATIVE NEGATIVE Final   Influenza B by PCR NEGATIVE NEGATIVE Final    Comment: (NOTE) The Xpert Xpress SARS-CoV-2/FLU/RSV plus assay is intended as an aid in the diagnosis of influenza from Nasopharyngeal swab specimens and should not be used as a sole basis for treatment. Nasal washings and aspirates are unacceptable for Xpert Xpress SARS-CoV-2/FLU/RSV testing.  Fact Sheet for Patients: BloggerCourse.com  Fact Sheet for Healthcare Providers: SeriousBroker.it  This test is not yet approved or cleared by the [place] FDA and has been authorized for detection and/or diagnosis of SARS-CoV-2 by FDA under an Emergency Use Authorization (EUA). This EUA will remain in effect (meaning this test can be used) for the duration of the COVID-19 declaration under Section 564(b)(1) of the Act, 21 U.S.C. section 360bbb-3(b)(1), unless the authorization is terminated or revoked.     Resp Syncytial Virus by PCR NEGATIVE NEGATIVE Final    Comment: (NOTE) Fact Sheet for Patients: BloggerCourse.com  Fact Sheet for Healthcare Providers: SeriousBroker.it  This test is not yet approved or cleared by the [place] FDA and has been authorized for detection and/or diagnosis of SARS-CoV-2 by FDA under an Emergency Use Authorization (EUA). This EUA will remain in effect (meaning this test can be used) for  the duration of the COVID-19 declaration under Section 564(b)(1) of the Act, 21 U.S.C. section 360bbb-3(b)(1), unless the authorization is terminated or revoked.  Performed at [facility], [address] [place], [place] [address]      Labs: BNP (last 3 results) Recent Labs    03/22/24 1215  BNP 87.4   Basic Metabolic Panel: Recent Labs  Lab 03/22/24 1215 03/23/24 0428  NA 138 138  K 4.9 4.3  CL 106 107  CO2 21* 24  GLUCOSE 268* 216*  BUN 45* 45*  CREATININE 2.60* 2.57*  CALCIUM 8.8* 8.5*   Liver Function Tests: No results for input(s): "AST", "ALT", "ALKPHOS", "BILITOT", "PROT", "ALBUMIN" in the last 168 hours. No results  for input(s): "LIPASE", "AMYLASE" in the last 168 hours. No results for input(s): "AMMONIA" in the last 168 hours. CBC: Recent Labs  Lab 03/22/24 1215 03/23/24 0428  WBC 12.1* 9.1  HGB 14.0 12.4*  HCT 42.3 36.8*  MCV 95.9 92.9  PLT 237 225   Cardiac Enzymes: No results for input(s): "CKTOTAL", "CKMB", "CKMBINDEX", "TROPONINI" in the last 168 hours. BNP: Invalid input(s): "POCBNP" CBG: Recent Labs  Lab 03/22/24 2119 03/23/24 0746 03/23/24 1126  GLUCAP 320* 216* 223*   D-Dimer No results for input(s): "DDIMER" in the last 72 hours. Hgb A1c No results for input(s): "HGBA1C" in the last 72 hours. Lipid Profile No results for input(s): "CHOL", "HDL", "LDLCALC", "TRIG", "CHOLHDL", "LDLDIRECT" in the last 72 hours. Thyroid function studies No results for input(s): "TSH", "T4TOTAL", "T3FREE", "THYROIDAB" in the last 72 hours.  Invalid input(s): "FREET3" Anemia work up No results for input(s): "VITAMINB12", "FOLATE", "FERRITIN", "TIBC", "IRON", "RETICCTPCT" in the last 72 hours. Urinalysis    Component Value Date/Time   COLORURINE YELLOW (A) 03/22/2024 1218   APPEARANCEUR CLEAR (A) 03/22/2024 1218   LABSPEC 1.014 03/22/2024 1218   PHURINE 5.0 03/22/2024 1218   GLUCOSEU >=500 (A) 03/22/2024 1218   HGBUR NEGATIVE  03/22/2024 1218   BILIRUBINUR NEGATIVE 03/22/2024 1218   KETONESUR 5 (A) 03/22/2024 1218   PROTEINUR 100 (A) 03/22/2024 1218   NITRITE NEGATIVE 03/22/2024 1218   LEUKOCYTESUR MODERATE (A) 03/22/2024 1218   Sepsis Labs Recent Labs  Lab 03/22/24 1215 03/23/24 0428  WBC 12.1* 9.1   Microbiology Recent Results (from the past 240 hours)  Resp panel by RT-PCR (RSV, Flu A&B, Covid) Anterior Nasal Swab     Status: None   Collection Time: 03/22/24 12:18 PM   Specimen: Anterior Nasal Swab  Result Value Ref Range Status   SARS Coronavirus 2 by RT PCR NEGATIVE NEGATIVE Final    Comment: (NOTE) SARS-CoV-2 target nucleic acids are NOT DETECTED.  The SARS-CoV-2 RNA is generally detectable in upper respiratory specimens during the acute phase of infection. The lowest concentration of SARS-CoV-2 viral copies this assay can detect is 138 copies/mL. A negative result does not preclude SARS-Cov-2 infection and should not be used as the sole basis for treatment or other patient management decisions. A negative result may occur with  improper specimen collection/handling, submission of specimen other than nasopharyngeal swab, presence of viral mutation(s) within the areas targeted by this assay, and inadequate number of viral copies(<138 copies/mL). A negative result must be combined with clinical observations, patient history, and epidemiological information. The expected result is Negative.  Fact Sheet for Patients:  BloggerCourse.com  Fact Sheet for Healthcare Providers:  SeriousBroker.it  This test is no t yet approved or cleared by the [place] FDA and  has been authorized for detection and/or diagnosis of SARS-CoV-2 by FDA under an Emergency Use Authorization (EUA). This EUA will remain  in effect (meaning this test can be used) for the duration of the COVID-19 declaration under Section 564(b)(1) of the Act, 21 U.S.C.section  360bbb-3(b)(1), unless the authorization is terminated  or revoked sooner.       Influenza A by PCR NEGATIVE NEGATIVE Final   Influenza B by PCR NEGATIVE NEGATIVE Final    Comment: (NOTE) The Xpert Xpress SARS-CoV-2/FLU/RSV plus assay is intended as an aid in the diagnosis of influenza from Nasopharyngeal swab specimens and should not be used as a sole basis for treatment. Nasal washings and aspirates are unacceptable for Xpert Xpress SARS-CoV-2/FLU/RSV testing.  Fact Sheet  for Patients: BloggerCourse.com  Fact Sheet for Healthcare Providers: SeriousBroker.it  This test is not yet approved or cleared by the [place] FDA and has been authorized for detection and/or diagnosis of SARS-CoV-2 by FDA under an Emergency Use Authorization (EUA). This EUA will remain in effect (meaning this test can be used) for the duration of the COVID-19 declaration under Section 564(b)(1) of the Act, 21 U.S.C. section 360bbb-3(b)(1), unless the authorization is terminated or revoked.     Resp Syncytial Virus by PCR NEGATIVE NEGATIVE Final    Comment: (NOTE) Fact Sheet for Patients: BloggerCourse.com  Fact Sheet for Healthcare Providers: SeriousBroker.it  This test is not yet approved or cleared by the [place] FDA and has been authorized for detection and/or diagnosis of SARS-CoV-2 by FDA under an Emergency Use Authorization (EUA). This EUA will remain in effect (meaning this test can be used) for the duration of the COVID-19 declaration under Section 564(b)(1) of the Act, 21 U.S.C. section 360bbb-3(b)(1), unless the authorization is terminated or revoked.  Performed at [facility], [address] [place], [place] [address]    Imaging MR BRAIN WO CONTRAST Result Date: 03/22/2024 CLINICAL DATA:  Dizziness. EXAM: MRI HEAD WITHOUT CONTRAST TECHNIQUE: Multiplanar, multiecho  pulse sequences of the brain and surrounding structures were obtained without intravenous contrast. COMPARISON:  Head MRI 05/04/2022 FINDINGS: Brain: There is no evidence of an acute infarct, intracranial hemorrhage, mass, midline shift, or extra-axial fluid collection. An enlarged retrocerebellar CSF space is unchanged and likely reflects an arachnoid cyst. Confluent T2 hyperintensities in the cerebral white matter bilaterally are unchanged and nonspecific but compatible with moderate chronic small vessel ischemic disease. There is moderate cerebral atrophy. Vascular: Major intracranial vascular flow voids are preserved. Skull and upper cervical spine: Unremarkable bone marrow signal. Sinuses/Orbits: Bilateral cataract extraction. Paranasal sinuses and mastoid air cells are clear. Other: None. IMPRESSION: 1. No acute intracranial abnormality. 2. Moderate chronic small vessel ischemic disease and cerebral atrophy. Electronically Signed   By: Sebastian Ache M.D.   On: 03/22/2024 18:03      Time coordinating discharge: over 30 minutes  SIGNED:  Sunnie Nielsen DO [facility]

## 2024-03-23 NOTE — Hospital Course (Addendum)
 Hospital course / significant events:   HPI:  Stephen White is a 88 y.o. male with medical history significant of vertigo, hypertension, hyperlipidemia, diabetes mellitus, asthma, diastolic CHF, GERD, anxiety, CKD-4, hearing loss, skin cancer, obesity, who presents with dizziness, burning with urination. Dizziness since morning 04/03, spinning sensation. (+)nausea, vomited x1, intermittent burning on urination.   04/03: VSS, no cocnerns on CBC other tham nild leukocytosis, BMP showed hyperglycemia 268 and Cr baseline 2.60. BNP, troponin, lactic all WNL. Neg flu/COVID/RSV. UA (+)mod leuks, WBC 11-20, epithelial cells 0-5, culture pending. MRI brain negative for CVA, admitted for observation.  04/04:      Consultants:  ***  Procedures/Surgeries: ***      ASSESSMENT & PLAN:   Vertigo: MRI of the brain negative for stroke.  No focal neurodeficit on physical examination.   -Place in telemetry bed for observation -Fall precaution -Received IV fluid in ED -Vestibular TP   UTI (urinary tract infection) -IV Rocephin -follow-up urine culture   Chronic diastolic CHF (congestive heart failure) (HCC): 2D echo on 02/17/2022 showed EF> 55% with grade 1 diastolic dysfunction.  Patient does not have leg edema or JVD.  No SOB CHF is compensated. -Check BNP   HTN (hypertension) -IV hydralazine as needed -Continue Cozaar -Hold HCTZ temporarily due to vertigo   Type 2 diabetes mellitus with diabetic chronic kidney disease (HCC) recent A1c 8.7, poorly controlled.  Patient said taking NovoLog and Tresiba 15 units daily -Glargine insulin 10 units daily -SSI   CKD (chronic kidney disease), stage IV (HCC): Renal function close to baseline.  Recent baseline creatinine 2.3-2.6.  His creatinine is 2.6, BUN 45 and GFR 23. -Follow-up by BMP   Asthma: Stable, no wheezing -Bronchodilators and as needed Mucinex   Anxiety -Continue home as needed Xanax      *** based on BMI: Body mass index is  30.35 kg/m.  Underweight - under 18  overweight - 25 to 29 obese - 30 or more Class 1 obesity: BMI of 30.0 to 34 Class 2 obesity: BMI of 35.0 to 39 Class 3 obesity: BMI of 40.0 to 49 Super Morbid Obesity: BMI 50-59 Super-super Morbid Obesity: BMI 60+ Significantly low or high BMI is associated with higher medical risk.  Weight management advised as adjunct to other disease management and risk reduction treatments    DVT prophylaxis: *** IV fluids: *** continuous IV fluids  Nutrition: *** Central lines / other devices: ***  Code Status: *** ACP documentation reviewed: *** none on file in [facility]  TOC needs: *** Medical barriers to dispo: ***. Expected medical readiness for discharge ***.

## 2024-03-23 NOTE — Evaluation (Signed)
 Physical Therapy Evaluation Patient Details Name: Stephen White MRN: [id number] DOB: 03/06/1936 Today's Date: 03/23/2024  History of Present Illness  Merlen Gurry is an 87yoM who presents to [facility] ED via [facility] from home 03/22/24. Pt c/o dizziness and weakness that started 0900 this AM. Report NV, yellowish color. Negative stroke screen. PMH: vertigo, HTN, HLD, DM, asthma, dCHF, GERD, GAD, CKD4, HOH, skin CA, obesity. MRI of the brain negative for stroke.  No focal neurodeficit on physical examination.  Clinical Impression  Pt in bed on entry agreeable to assessment. Pt denies any dizziness today, reports last episode durign transition from [facility] to ED gurney. BPPV screening tests are negative bilaterally. Orthostatic BP assessment is WNL. Pt able to demonstrate >211ft AMB without device and is essentially at baseline. Will sign off at this time. No service or equipment recommendations at DC.       If plan is discharge home, recommend the following:     Can travel by private vehicle        Equipment Recommendations None recommended by PT  Recommendations for Other Services       Functional Status Assessment Patient has not had a recent decline in their functional status     Precautions / Restrictions Precautions Precautions: Fall Restrictions Weight Bearing Restrictions Per Provider Order: No      Mobility  Bed Mobility Overal bed mobility: Modified Independent                  Transfers Overall transfer level: Modified independent Equipment used: None                    Ambulation/Gait Ambulation/Gait assistance: Supervision Gait Distance (Feet): 240 Feet Assistive device: None Gait Pattern/deviations: Step-through pattern       General Gait Details: reports to feel slightly weak,but otherwise at baseline  Stairs            Wheelchair Mobility     Tilt Bed    Modified Rankin (Stroke Patients Only)       Balance Overall balance assessment:  Modified Independent                                           Pertinent Vitals/Pain Pain Assessment Pain Assessment: No/denies pain    Home Living Family/patient expects to be discharged to:: Private residence Living Arrangements: Spouse/significant other (disabled wife) Available Help at Discharge: Family (DTR lives very close; works ED management [employer];) Type of Home: House Home Access: Ramped entrance (1 person lift at entrance)       Home Layout: One level Home Equipment: Cane - single Librarian, academic (2 wheels)      Prior Function Prior Level of Function : Independent/Modified Independent (let his license expire due to diabetic visual defiicts)             Mobility Comments: SPC for ad lib mobility; wife drives, they will go together or DTR will help with food when needed       Extremity/Trunk Assessment                Communication        Cognition  Cueing       General Comments      Exercises Other Exercises Other Exercises: Dix Hallpike Test: Negative Other Exercises: Horizontal head roll test: Negative   Assessment/Plan    PT Assessment Patient does not need any further PT services  PT Problem List         PT Treatment Interventions      PT Goals (Current goals can be found in the Care Plan section)  Acute Rehab PT Goals PT Goal Formulation: All assessment and education complete, DC therapy    Frequency       Co-evaluation               AM-PAC PT "6 Clicks" Mobility  Outcome Measure Help needed turning from your back to your side while in a flat bed without using bedrails?: None Help needed moving from lying on your back to sitting on the side of a flat bed without using bedrails?: None Help needed moving to and from a bed to a chair (including a wheelchair)?: None Help needed standing up from a chair using your arms (e.g., wheelchair or  bedside chair)?: None Help needed to walk in hospital room?: None Help needed climbing 3-5 steps with a railing? : A Little 6 Click Score: 23    End of Session   Activity Tolerance: Patient tolerated treatment well;No increased pain Patient left: in bed;with call bell/phone within reach Nurse Communication: Mobility status PT Visit Diagnosis: Dizziness and giddiness (R42);Other symptoms and signs involving the nervous system (R29.898)    Time: 1308-6578 PT Time Calculation (min) (ACUTE ONLY): 42 min   Charges:   PT Evaluation $PT Eval Moderate Complexity: 1 Mod PT Treatments $Canalith Rep Proc: 8-22 mins PT General Charges $$ ACUTE PT VISIT: 1 Visit        12:08 PM, 03/23/24 Rosamaria Lints, PT, DPT Physical Therapist - [facility]  [phone] (ASCOM)   Lige Lakeman C 03/23/2024, 12:06 PM

## 2024-03-30 ENCOUNTER — Ambulatory Visit (INDEPENDENT_AMBULATORY_CARE_PROVIDER_SITE_OTHER)

## 2024-03-30 DIAGNOSIS — Z0000 Encounter for general adult medical examination without abnormal findings: Secondary | ICD-10-CM

## 2024-03-30 NOTE — Patient Instructions (Addendum)
 [name] , Thank you for taking time to come for your Medicare Wellness Visit. I appreciate your ongoing commitment to your health goals. Please review the following plan we discussed and let me know if I can assist you in the future.   Referrals/Orders/Follow-Ups/Clinician Recommendations: NONE  This is a list of the screening recommended for you and due dates:  Health Maintenance  Topic Date Due   Yearly kidney health urinalysis for diabetes  Never done   Zoster (Shingles) Vaccine (1 of 2) 12/14/1957   Hemoglobin A1C  10/13/2022   Eye exam for diabetics  10/29/2022   Complete foot exam   07/08/2023   Flu Shot  07/20/2024   Yearly kidney function blood test for diabetes  03/23/2025   Medicare Annual Wellness Visit  03/30/2025   DTaP/Tdap/Td vaccine (2 - Tdap) 07/01/2025   Pneumonia Vaccine  Completed   HPV Vaccine  Aged Out   Meningitis B Vaccine  Aged Out   COVID-19 Vaccine  Discontinued    Advanced directives: (ACP Link)Information on Advanced Care Planning can be found at [facility] [place] Advance Health Care Directives Advance Health Care Directives. [email or web address]   Next Medicare Annual Wellness Visit scheduled for next year: Yes   04/05/25 @ 10:10 AM BY PHONE

## 2024-03-30 NOTE — Progress Notes (Signed)
 Subjective:   Stephen White is a 88 y.o. who presents for a Medicare Wellness preventive visit.  Visit Complete: Virtual I connected with  Bari Edward on 03/30/24 by a audio enabled telemedicine application and verified that I am speaking with the correct person using two identifiers.  Patient Location: Home  Provider Location: Office/Clinic  I discussed the limitations of evaluation and management by telemedicine. The patient expressed understanding and agreed to proceed.  Vital Signs: Because this visit was a virtual/telehealth visit, some criteria may be missing or patient reported. Any vitals not documented were not able to be obtained and vitals that have been documented are patient reported.  VideoDeclined- This patient declined Librarian, academic. Therefore the visit was completed with audio only.  Persons Participating in Visit: Patient.  AWV Questionnaire: No: Patient Medicare AWV questionnaire was not completed prior to this visit.  Cardiac Risk Factors include: advanced age (>36men, >4 women);diabetes mellitus;dyslipidemia;male gender;hypertension;sedentary lifestyle;obesity (BMI >30kg/m2)     Objective:    Today's Vitals   03/30/24 1100  PainSc: 0-No pain   There is no height or weight on file to calculate BMI.     03/30/2024   11:12 AM 03/22/2024   12:14 PM 02/25/2023    3:26 PM 02/19/2022    3:34 PM 04/03/2021    7:32 AM 03/09/2021    4:15 PM 08/12/2020    3:13 PM  Advanced Directives  Does Patient Have a Medical Advance Directive? No No No No Yes No Yes  Type of Agricultural consultant;Living will  Healthcare Power of [place];Living will  Does patient want to make changes to medical advance directive?     No - Patient declined    Copy of Healthcare Power of Attorney in Chart?     No - copy requested  No - copy requested  Would patient like information on creating a medical advance directive? No - Patient  declined No - Patient declined No - Patient declined No - Patient declined  No - Patient declined     Current Medications (verified) Outpatient Encounter Medications as of 03/30/2024  Medication Sig   ALPRAZolam (XANAX) 0.5 MG tablet Take 1 tablet (0.5 mg total) by mouth 2 (two) times daily as needed for anxiety. May start with half pill for 0.25mg  dose. May increase in future if needed.   aspirin 81 MG EC tablet Take 81 mg by mouth daily   Blood Glucose Monitoring Suppl (ONETOUCH VERIO FLEX SYSTEM) w/Device KIT Use to check blood sugar up to twice daily as directed   Carboxymethylcellulose Sodium (EYE DROPS OP) Apply to eye.   cephALEXin (KEFLEX) 500 MG capsule Take 1 capsule (500 mg total) by mouth 2 (two) times daily.   Continuous Blood Gluc Receiver (FREESTYLE LIBRE 2 READER) DEVI by Does not apply route.   Continuous Blood Gluc Sensor (FREESTYLE LIBRE 14 DAY SENSOR) MISC 1 kit.   gabapentin (NEURONTIN) 300 MG capsule Take 300 mg by mouth as needed.   glucose blood (ONETOUCH VERIO) test strip Use to check blood sugar twice daily   GVOKE HYPOPEN 2-PACK 1 MG/0.2ML SOAJ Inject 1 mg into the skin as needed (hypoglycemia).   hydrochlorothiazide (HYDRODIURIL) 12.5 MG tablet Take 1 tablet (12.5 mg total) by mouth daily. Hold this medication if your blood pressure is top number less than 120, or bottom number less than 80   insulin degludec (TRESIBA) 100 UNIT/ML FlexTouch Pen Inject 15 Units into  the skin at bedtime.   Lancets (ONETOUCH DELICA PLUS LANCET30G) MISC Use to check blood sugar twice daily   levalbuterol (XOPENEX HFA) 45 MCG/ACT inhaler Inhale 1 puff into the lungs every 6 (six) hours as needed for wheezing.   losartan (COZAAR) 50 MG tablet Take 1 tablet (50 mg total) by mouth daily.   Multiple Vitamins-Minerals (PX COMPLETE SENIOR MULTIVITS) TABS Take by mouth.   SALINE MIST SPRAY NA Place into the nose.   vitamin B-12 (CYANOCOBALAMIN) 500 MCG tablet Take 500 mcg by mouth daily.    famotidine (PEPCID) 10 MG tablet Take 1 tablet (10 mg total) by mouth daily. (Patient not taking: Reported on 03/30/2024)   No facility-administered encounter medications on file as of 03/30/2024.    Allergies (verified) Patient has no known allergies.   History: Past Medical History:  Diagnosis Date   Actinic keratosis    Allergy    Basal cell carcinoma 08/29/2018   L post inf ear/excision   Basal cell carcinoma 08/05/2020   L lat forehead above the brow - [facility]   Chronic kidney disease    GERD (gastroesophageal reflux disease)    Hyperlipidemia    Hypertension    SCC (squamous cell carcinoma) 12/22/2023   R zygoma, MOHs completed 01/24/2024   Squamous cell carcinoma of skin 03/26/2015   L dorsal hand   Squamous cell carcinoma of skin 06/17/2015   R cheek   Squamous cell carcinoma of skin 05/10/2019   R hand dorsum/in situ   Squamous cell carcinoma of skin 08/05/2020   R of mideline forehead - [facility]   Squamous cell carcinoma of skin 10/03/2023   R lat neck, [facility]   Thyroid disease    Type 2 diabetes mellitus (HCC)    Vertigo    Wears hearing aid in both ears    Past Surgical History:  Procedure Laterality Date   CATARACT EXTRACTION W/PHACO Left 04/03/2021   Procedure: CATARACT EXTRACTION PHACO AND INTRAOCULAR LENS PLACEMENT (IOC) LEFT DIABETIC 17.22 01:53.3 15.2%;  Surgeon: Lockie Mola, MD;  Location[facility];  Service: Ophthalmology;  Laterality: Left;  Diabetic - insulin   EYE SURGERY     Family History  Problem Relation Age of Onset   Diabetes Mother    Diabetes Father    Stroke Father    Diabetes Brother    Diabetes Sister    Diabetes Sister    Diabetes Brother    Diabetes Brother    Diabetes Brother    Cancer Brother    Diabetes Brother    Social History   Socioeconomic History   Marital status: Married    Spouse name: Not on file   Number of children: 1   Years of education: Not on file   Highest education level: 12th grade   Occupational History   Occupation: retired  Tobacco Use   Smoking status: Former    Current packs/day: 0.00    Average packs/day: 0.3 packs/day for 25.0 years (7.5 ttl pk-yrs)    Types: Cigarettes    Start date: 12/19/1953    Quit date: 12/19/1978    Years since quitting: 45.3   Smokeless tobacco: Former  Building services engineer status: Never Used  Substance and Sexual Activity   Alcohol use: Yes    Alcohol/week: 0.0 standard drinks of alcohol   Drug use: No   Sexual activity: Not on file  Other Topics Concern   Not on file  Social History Narrative   Not on file  Social Drivers of Corporate investment banker Strain: Low Risk  (03/30/2024)   Overall Financial Resource Strain (CARDIA)    Difficulty of Paying Living Expenses: Not hard at all  Food Insecurity: No Food Insecurity (03/30/2024)   Hunger Vital Sign    Worried About Running Out of Food in the Last Year: Never true    Ran Out of Food in the Last Year: Never true  Transportation Needs: No Transportation Needs (03/30/2024)   PRAPARE - Administrator, Civil Service (Medical): No    Lack of Transportation (Non-Medical): No  Physical Activity: Inactive (03/30/2024)   Exercise Vital Sign    Days of Exercise per Week: 0 days    Minutes of Exercise per Session: 0 min  Stress: No Stress Concern Present (03/30/2024)   [employer] of Occupational Health - Occupational Stress Questionnaire    Feeling of Stress : Not at all  Social Connections: Moderately Isolated (03/30/2024)   Social Connection and Isolation Panel [NHANES]    Frequency of Communication with Friends and Family: Three times a week    Frequency of Social Gatherings with Friends and Family: Never    Attends Religious Services: Never    Database administrator or Organizations: No    Attends Engineer, structural: Never    Marital Status: Married    Tobacco Counseling Counseling given: Not Answered    Clinical Intake:  Pre-visit  preparation completed: Yes  Pain : No/denies pain Pain Score: 0-No pain     BMI - recorded: 30.3 Nutritional Status: BMI > 30  Obese Nutritional Risks: None Diabetes: Yes CBG done?: No Did pt. bring in CBG monitor from home?: No  Lab Results  Component Value Date   HGBA1C 8.7 04/13/2022   HGBA1C 8.1 12/31/2020   HGBA1C 8.4 09/17/2020     How often do you need to have someone help you when you read instructions, pamphlets, or other written materials from your doctor or pharmacy?: 1 - Never  Interpreter Needed?: No  Information entered by :: Kennedy Bucker, LPN   Activities of Daily Living     03/30/2024   11:14 AM 04/29/2023    9:54 AM  In your present state of health, do you have any difficulty performing the following activities:  Hearing? 1 1  Vision? 0 1  Difficulty concentrating or making decisions? 0 1  Walking or climbing stairs? 1 1  Dressing or bathing? 0 0  Doing errands, shopping? 1 1  Preparing Food and eating ? N   Using the Toilet? N   In the past six months, have you accidently leaked urine? N   Do you have problems with loss of bowel control? N   Managing your Medications? N   Managing your Finances? N   Housekeeping or managing your Housekeeping? N     Patient Care Team: Smitty Cords, DO as PCP - General (Family Medicine) [place] Crane, MD as Consulting Physician (Ophthalmology) Tedd Sias Marlana Salvage, MD as Physician Assistant (Endocrinology) Deirdre Evener, MD as Consulting Physician (Dermatology) Ronney Asters, Jackelyn Poling, RPH-CPP as Pharmacist  Indicate any recent Medical Services you may have received from other than [facility] providers in the past year (date may be approximate).     Assessment:   This is a routine wellness examination for [employer].  Hearing/Vision screen Hearing Screening - Comments:: WEARS AIDS, BOTH EARS Vision Screening - Comments:: WEARS GLASSES ALL DAY- DR.KING   Goals Addressed  This Visit's  Progress    Cut out extra servings         Depression Screen     03/30/2024   11:10 AM 04/29/2023    9:54 AM 02/25/2023    3:23 PM 02/26/2022    2:09 PM 02/19/2022    3:33 PM 11/17/2020    1:37 PM 08/15/2020    1:37 PM  PHQ 2/9 Scores  PHQ - 2 Score 0 0 0 3 0 0 0  PHQ- 9 Score 0 9 0 7       Fall Risk     03/30/2024   11:14 AM 04/29/2023    9:54 AM 02/25/2023    3:27 PM 02/26/2022    2:09 PM 02/19/2022    3:38 PM  Fall Risk   Falls in the past year? 0 1 0 0   Number falls in past yr: 0 0 0 0 0  Injury with Fall? 0 0 0 0   Risk for fall due to : No Fall Risks History of fall(s) No Fall Risks No Fall Risks   Follow up Falls prevention discussed;Falls evaluation completed Falls evaluation completed Falls prevention discussed;Falls evaluation completed Falls evaluation completed     MEDICARE RISK AT HOME:  Medicare Risk at Home Any stairs in or around the home?: No If so, are there any without handrails?: No Home free of loose throw rugs in walkways, pet beds, electrical cords, etc?: Yes Adequate lighting in your home to reduce risk of falls?: Yes Life alert?: No Use of a cane, walker or w/c?: Yes (CANE MOST OF THE TIME) Grab bars in the bathroom?: Yes Shower chair or bench in shower?: Yes Elevated toilet seat or a handicapped toilet?: Yes  TIMED UP AND GO:  Was the test performed?  No  Cognitive Function: 6CIT completed        03/30/2024   11:16 AM 02/25/2023    3:33 PM 08/12/2020    3:22 PM 06/08/2018    9:32 AM 06/07/2017    2:59 PM  6CIT Screen  What Year? 0 points 0 points 0 points 4 points 0 points  What month? 0 points 0 points 0 points 0 points 0 points  What time? 0 points 0 points 0 points 0 points 0 points  Count back from 20 0 points 0 points 2 points 0 points 0 points  Months in reverse 4 points 2 points 4 points 2 points 4 points  Repeat phrase 2 points 2 points 0 points 0 points 0 points  Total Score 6 points 4 points 6 points 6 points 4 points     Immunizations Immunization History  Administered Date(s) Administered   Fluad Quad(high Dose 65+) 11/02/2019, 10/16/2020, 12/29/2021, 09/21/2022   Fluad Trivalent(High Dose 65+) 10/07/2023   Influenza, High Dose Seasonal PF 10/03/2015, 11/03/2016, 10/06/2017, 11/06/2018   PFIZER(Purple Top)SARS-COV-2 Vaccination 01/14/2020, 02/04/2020   PNEUMOCOCCAL CONJUGATE-20 02/23/2024   Pneumococcal Conjugate-13 12/24/2014   Pneumococcal Polysaccharide-23 12/25/2012   Td 07/02/2015   Zoster, Live 12/25/2012    Screening Tests Health Maintenance  Topic Date Due   Diabetic kidney evaluation - Urine ACR  Never done   Zoster Vaccines- Shingrix (1 of 2) 12/14/1957   HEMOGLOBIN A1C  10/13/2022   OPHTHALMOLOGY EXAM  10/29/2022   FOOT EXAM  07/08/2023   INFLUENZA VACCINE  07/20/2024   Diabetic kidney evaluation - eGFR measurement  03/23/2025   Medicare Annual Wellness (AWV)  03/30/2025   DTaP/Tdap/Td (2 - Tdap) 07/01/2025   Pneumonia Vaccine  83+ Years old  Completed   HPV VACCINES  Aged Out   Meningococcal B Vaccine  Aged Out   COVID-19 Vaccine  Discontinued    Health Maintenance  Health Maintenance Due  Topic Date Due   Diabetic kidney evaluation - Urine ACR  Never done   Zoster Vaccines- Shingrix (1 of 2) 12/14/1957   HEMOGLOBIN A1C  10/13/2022   OPHTHALMOLOGY EXAM  10/29/2022   FOOT EXAM  07/08/2023   Health Maintenance Items Addressed: UP TO DATE, WANTS NO MORE COVID SHOTS  Additional Screening:  Vision Screening: Recommended annual ophthalmology exams for early detection of glaucoma and other disorders of the eye.  Dental Screening: Recommended annual dental exams for proper oral hygiene  Community Resource Referral / Chronic Care Management: CRR required this visit?  No   CCM required this visit?  No     Plan:     I have personally reviewed and noted the following in the patient's chart:   Medical and social history Use of alcohol, tobacco or illicit drugs   Current medications and supplements including opioid prescriptions. Patient is not currently taking opioid prescriptions. Functional ability and status Nutritional status Physical activity Advanced directives List of other physicians Hospitalizations, surgeries, and ER visits in previous 12 months Vitals Screenings to include cognitive, depression, and falls Referrals and appointments  In addition, I have reviewed and discussed with patient certain preventive protocols, quality metrics, and best practice recommendations. A written personalized care plan for preventive services as well as general preventive health recommendations were provided to patient.     Hal Hope, LPN   1/61/0960   After Visit Summary: (MyChart) Due to this being a telephonic visit, the after visit summary with patients personalized plan was offered to patient via MyChart   Notes: Nothing significant to report at this time.

## 2024-04-24 ENCOUNTER — Ambulatory Visit: Payer: Medicare HMO | Admitting: Family Medicine

## 2024-04-24 ENCOUNTER — Encounter: Payer: Self-pay | Admitting: Family Medicine

## 2024-04-24 VITALS — BP 110/68 | HR 62 | Ht 66.0 in | Wt 187.0 lb

## 2024-04-24 DIAGNOSIS — R42 Dizziness and giddiness: Secondary | ICD-10-CM

## 2024-04-24 DIAGNOSIS — E1121 Type 2 diabetes mellitus with diabetic nephropathy: Secondary | ICD-10-CM | POA: Diagnosis not present

## 2024-04-24 DIAGNOSIS — F458 Other somatoform disorders: Secondary | ICD-10-CM | POA: Diagnosis not present

## 2024-04-24 DIAGNOSIS — Z794 Long term (current) use of insulin: Secondary | ICD-10-CM

## 2024-04-24 DIAGNOSIS — I129 Hypertensive chronic kidney disease with stage 1 through stage 4 chronic kidney disease, or unspecified chronic kidney disease: Secondary | ICD-10-CM

## 2024-04-24 DIAGNOSIS — N184 Chronic kidney disease, stage 4 (severe): Secondary | ICD-10-CM

## 2024-04-24 DIAGNOSIS — F411 Generalized anxiety disorder: Secondary | ICD-10-CM

## 2024-04-24 NOTE — Patient Instructions (Addendum)
 Thank you for coming to the office today.  For Marissa Sida System Specialist at [phone] from 9 AM to 8 PM ET Monday through Friday or from 9 AM to 3 PM ET on Saturday.  Call to check coverage and ask her insurance [employer] cost / coverage  ---------------------------------  Nephrologist 05/03/24 at 2:00pm  [facility] NPI: [id number] [place] [address]   Phone: [phone] ------------------  Neurology 05/08/24 at 2:30pm  Herold Lora, MD Neurology NPI: [id number] [place] [address]   Phone: [phone]  Please schedule a Follow-up Appointment to: Return in about 6 months (around 10/25/2024) for 6 month follow-up.  If you have any other questions or concerns, please feel free to call the office or send a message through MyChart. You may also schedule an earlier appointment if necessary.  Additionally, you may be receiving a survey about your experience at our office within a few days to 1 week by e-mail or mail. We value your feedback.  Domingo Friend, DO [facility], [place]

## 2024-04-24 NOTE — Progress Notes (Addendum)
 Subjective:    Patient ID: Stephen White, male    DOB: 09-Jan-1936, 88 y.o.   MRN: [id number]  Stephen White is a 88 y.o. male presenting on 04/24/2024 for Diabetes and Hypertension   HPI  Discussed the use of AI scribe software for clinical note transcription with the patient, who gave verbal consent to proceed.  History of Present Illness   [name] is an 88 year old male with diabetes and chronic kidney disease who presents for a routine follow-up visit.  He recently visited the emergency room in April due to a suspected urinary tract infection, but a urine culture later confirmed there was no infection. He experienced discomfort due to the cold environment and lack of rest in the hospital, which has made him reluctant to return.  During the visit, his urine showed elevated blood sugar levels, which he attributes to not having eaten until late in the morning, causing his blood sugar to rise.  Followed by Endocrinology He has a history of diabetes and has been managing his insulin  doses. Previously, he was taking 40 units of Novolog  insulin , but after experiencing dyspnea, he reduced his dose to 15-20 units depending on his blood sugar levels and dietary intake. Since adjusting his insulin , he has not experienced the dyspnea episodes. His glucose levels have improved, now running around 150, down from over 200. His last A1c in February was 8.3%.  He has chronic kidney disease with known decreased kidney function, which has been stable over time. He is scheduled to see a kidney specialist in nine days.  He underwent Mohs surgery for squamous cell carcinoma on his face, which has healed well  He experiences issues with breathing and low energy, and he inquires about ways to boost his energy without affecting his blood sugar. He takes B12 supplements in pill form and is running low on them. OTC med will need more  He takes a half tablet of hydrochlorothiazide  daily for blood  pressure management, which he feels is effective. He also takes losartan , half a tablet daily, for heart health.  He is concerned about his wife's mobility issues and her need for assistance with bathroom activities due to her leg weakness.  Hyperventilation Syndrome He has been experiencing breathing difficulties for approximately 4+ years, characterized by episodes of hyperventilation 'real hard' breathing. He occasionally takes half a 'nerve pill' (alprazolam ) as needed, which helps alleviate his symptoms. Resting in a quiet place also provides relief. No exacerbation of breathing difficulties with walking or talking, although these activities may cause him to breathe harder. He sometimes experiences chest pain with his breathing issues, but it is not prolonged. - He has worked with multiple specialist for these issues over the past few years including Pulmonology, Nephrology, Endocrinology, Cardiology   He experiences episodes of weakness, during which he feels the need to sit or lie down. He describes shaking in his legs when standing, prompting him to find a seat. He also reports new-onset stuttering that began about a 1-2 years ago, coinciding with the onset of his breathing difficulties. Alprazolam  provides some relief from these symptoms AS NEEDED       03/30/2024   11:10 AM 04/29/2023    9:54 AM 02/25/2023    3:23 PM  Depression screen PHQ 2/9  Decreased Interest 0 0 0  Down, Depressed, Hopeless 0 0 0  PHQ - 2 Score 0 0 0  Altered sleeping 0 2 0  Tired, decreased energy 0  3 0  Change in appetite 0 0 0  Feeling bad or failure about yourself  0 0 0  Trouble concentrating 0 2 0  Moving slowly or fidgety/restless 0 2 0  Suicidal thoughts 0 0 0  PHQ-9 Score 0 9 0  Difficult doing work/chores Not difficult at all Not difficult at all Not difficult at all       04/29/2023    9:54 AM  GAD 7 : Generalized Anxiety Score  Nervous, Anxious, on Edge 3  Control/stop worrying 0  Worry too  much - different things 0  Trouble relaxing 0  Restless 0  Easily annoyed or irritable 2  Afraid - awful might happen 0  Total GAD 7 Score 5    Social History   Tobacco Use  . Smoking status: Former    Current packs/day: 0.00    Average packs/day: 0.3 packs/day for 25.0 years (7.5 ttl pk-yrs)    Types: Cigarettes    Start date: 12/19/1953    Quit date: 12/19/1978    Years since quitting: 45.3  . Smokeless tobacco: Former  Advertising account planner  . Vaping status: Never Used  Substance Use Topics  . Alcohol use: Yes    Alcohol/week: 0.0 standard drinks of alcohol  . Drug use: No    Review of Systems Per HPI unless specifically indicated above     Objective:    BP 110/68 (BP Location: Right Arm, Patient Position: Sitting, Cuff Size: Normal)   Pulse 62   Ht 5\' 6"  (1.676 m)   Wt 187 lb (84.8 kg)   SpO2 99%   BMI 30.18 kg/m   Wt Readings from Last 3 Encounters:  04/24/24 187 lb (84.8 kg)  03/22/24 188 lb 0.8 oz (85.3 kg)  02/13/24 188 lb (85.3 kg)    Physical Exam Vitals and nursing note reviewed.  Constitutional:      General: He is not in acute distress.    Appearance: Normal appearance. He is well-developed. He is not diaphoretic.     Comments: Well-appearing, comfortable, cooperative  HENT:     Head: Normocephalic and atraumatic.  Eyes:     General:        Right eye: No discharge.        Left eye: No discharge.     Conjunctiva/sclera: Conjunctivae normal.     Pupils: Pupils are equal, round, and reactive to light.  Neck:     Thyroid : No thyromegaly.  Cardiovascular:     Rate and Rhythm: Normal rate and regular rhythm.     Pulses: Normal pulses.     Heart sounds: Normal heart sounds. No murmur heard. Pulmonary:     Effort: Pulmonary effort is normal. No respiratory distress.     Breath sounds: Normal breath sounds. No wheezing or rales.     Comments: Hyperventilation and dyspnea. Impacts his talking, causes him to pause or stutter Abdominal:     General: Bowel  sounds are normal. There is no distension.     Palpations: Abdomen is soft. There is no mass.     Tenderness: There is no abdominal tenderness.  Musculoskeletal:        General: No tenderness. Normal range of motion.     Cervical back: Normal range of motion and neck supple.     Comments: Upper / Lower Extremities: - Normal muscle tone, strength bilateral upper extremities 5/5, lower extremities 5/5  Lymphadenopathy:     Cervical: No cervical adenopathy.  Skin:    General: Skin  is warm and dry.     Findings: No erythema, lesion (area of prior MOHS surgery healed R Cheek) or rash.  Neurological:     Mental Status: He is alert and oriented to person, place, and time.     Comments: Distal sensation intact to light touch all extremities  Psychiatric:        Mood and Affect: Mood normal.        Behavior: Behavior normal.        Thought Content: Thought content normal.     Comments: Well groomed, good eye contact, normal speech and thoughts    I have personally reviewed the radiology report from 03/22/24 on MRI Brain.  CLINICAL DATA:  Dizziness.   EXAM: MRI HEAD WITHOUT CONTRAST   TECHNIQUE: Multiplanar, multiecho pulse sequences of the brain and surrounding structures were obtained without intravenous contrast.   COMPARISON:  Head MRI 05/04/2022   FINDINGS: Brain: There is no evidence of an acute infarct, intracranial hemorrhage, mass, midline shift, or extra-axial fluid collection. An enlarged retrocerebellar CSF space is unchanged and likely reflects an arachnoid cyst. Confluent T2 hyperintensities in the cerebral white matter bilaterally are unchanged and nonspecific but compatible with moderate chronic small vessel ischemic disease. There is moderate cerebral atrophy.   Vascular: Major intracranial vascular flow voids are preserved.   Skull and upper cervical spine: Unremarkable bone marrow signal.   Sinuses/Orbits: Bilateral cataract extraction. Paranasal sinuses  and mastoid air cells are clear.   Other: None.   IMPRESSION: 1. No acute intracranial abnormality. 2. Moderate chronic small vessel ischemic disease and cerebral atrophy.     Electronically Signed   By: Aundra Lee M.D.   On: 03/22/2024 18:03  Results for orders placed or performed during the hospital encounter of 03/22/24  Basic metabolic panel   Collection Time: 03/22/24 12:15 PM  Result Value Ref Range   Sodium 138 135 - 145 mmol/L   Potassium 4.9 3.5 - 5.1 mmol/L   Chloride 106 98 - 111 mmol/L   CO2 21 (L) 22 - 32 mmol/L   Glucose, Bld 268 (H) 70 - 99 mg/dL   BUN 45 (H) 8 - 23 mg/dL   Creatinine, Ser 1.61 (H) 0.61 - 1.24 mg/dL   Calcium 8.8 (L) 8.9 - 10.3 mg/dL   GFR, Estimated 23 (L) >60 mL/min   Anion gap 11 5 - 15  CBC   Collection Time: 03/22/24 12:15 PM  Result Value Ref Range   WBC 12.1 (H) 4.0 - 10.5 K/uL   RBC 4.41 4.22 - 5.81 MIL/uL   Hemoglobin 14.0 13.0 - 17.0 g/dL   HCT 09.6 04.5 - 40.9 %   MCV 95.9 80.0 - 100.0 fL   MCH 31.7 26.0 - 34.0 pg   MCHC 33.1 30.0 - 36.0 g/dL   RDW 81.1 91.4 - 78.2 %   Platelets 237 150 - 400 K/uL   nRBC 0.0 0.0 - 0.2 %  Lactic acid, plasma   Collection Time: 03/22/24 12:15 PM  Result Value Ref Range   Lactic Acid, Venous 1.3 0.5 - 1.9 mmol/L  Brain natriuretic peptide   Collection Time: 03/22/24 12:15 PM  Result Value Ref Range   B Natriuretic Peptide 87.4 0.0 - 100.0 pg/mL  Troponin I (High Sensitivity)   Collection Time: 03/22/24 12:15 PM  Result Value Ref Range   Troponin I (High Sensitivity) 9 <18 ng/L  Resp panel by RT-PCR (RSV, Flu A&B, Covid) Anterior Nasal Swab   Collection Time: 03/22/24 12:18 PM  Specimen: Anterior Nasal Swab  Result Value Ref Range   SARS Coronavirus 2 by RT PCR NEGATIVE NEGATIVE   Influenza A by PCR NEGATIVE NEGATIVE   Influenza B by PCR NEGATIVE NEGATIVE   Resp Syncytial Virus by PCR NEGATIVE NEGATIVE  Urine Culture   Collection Time: 03/22/24 12:18 PM   Specimen: Urine, Clean  Catch  Result Value Ref Range   Specimen Description      URINE, CLEAN CATCH Performed at [facility], [address] [place], [place] [address]    Special Requests      NONE Performed at [facility], [address], [place], [place] [address]    Culture (A)     <10,000 COLONIES/mL INSIGNIFICANT GROWTH Performed at [facility], [address] [place], [place] [address]    Report Status 03/23/2024 FINAL   Urinalysis, Routine w reflex microscopic -Urine, Clean Catch   Collection Time: 03/22/24 12:18 PM  Result Value Ref Range   Color, Urine YELLOW (A) YELLOW   APPearance CLEAR (A) CLEAR   Specific Gravity, Urine 1.014 1.005 - 1.030   pH 5.0 5.0 - 8.0   Glucose, UA >=500 (A) NEGATIVE mg/dL   Hgb urine dipstick NEGATIVE NEGATIVE   Bilirubin Urine NEGATIVE NEGATIVE   Ketones, ur 5 (A) NEGATIVE mg/dL   Protein, ur 086 (A) NEGATIVE mg/dL   Nitrite NEGATIVE NEGATIVE   Leukocytes,Ua MODERATE (A) NEGATIVE   RBC / HPF 0-5 0 - 5 RBC/hpf   WBC, UA 11-20 0 - 5 WBC/hpf   Bacteria, UA RARE (A) NONE SEEN   Squamous Epithelial / HPF 0-5 0 - 5 /HPF  CBG monitoring, ED   Collection Time: 03/22/24  9:19 PM  Result Value Ref Range   Glucose-Capillary 320 (H) 70 - 99 mg/dL  Basic metabolic panel   Collection Time: 03/23/24  4:28 AM  Result Value Ref Range   Sodium 138 135 - 145 mmol/L   Potassium 4.3 3.5 - 5.1 mmol/L   Chloride 107 98 - 111 mmol/L   CO2 24 22 - 32 mmol/L   Glucose, Bld 216 (H) 70 - 99 mg/dL   BUN 45 (H) 8 - 23 mg/dL   Creatinine, Ser 5.78 (H) 0.61 - 1.24 mg/dL   Calcium 8.5 (L) 8.9 - 10.3 mg/dL   GFR, Estimated 24 (L) >60 mL/min   Anion gap 7 5 - 15  CBC   Collection Time: 03/23/24  4:28 AM  Result Value Ref Range   WBC 9.1 4.0 - 10.5 K/uL   RBC 3.96 (L) 4.22 - 5.81 MIL/uL   Hemoglobin 12.4 (L) 13.0 - 17.0 g/dL   HCT 46.9 (L) 62.9 - 52.8 %   MCV 92.9 80.0 - 100.0 fL   MCH 31.3 26.0 - 34.0 pg   MCHC 33.7 30.0 - 36.0 g/dL   RDW 41.3  24.4 - 01.0 %   Platelets 225 150 - 400 K/uL   nRBC 0.0 0.0 - 0.2 %  CBG monitoring, ED   Collection Time: 03/23/24  7:46 AM  Result Value Ref Range   Glucose-Capillary 216 (H) 70 - 99 mg/dL  CBG monitoring, ED   Collection Time: 03/23/24 11:26 AM  Result Value Ref Range   Glucose-Capillary 223 (H) 70 - 99 mg/dL      Assessment & Plan:   Problem List Items Addressed This Visit     Benign hypertension with CKD (chronic kidney disease) stage IV (HCC)   Relevant Medications   hydrochlorothiazide  (HYDRODIURIL )  12.5 MG tablet   losartan  (COZAAR ) 50 MG tablet   Hyperventilation syndrome   Type 2 diabetes mellitus with diabetic nephropathy, with long-term current use of insulin  (HCC)   Relevant Medications   losartan  (COZAAR ) 50 MG tablet   Other Visit Diagnoses       GAD (generalized anxiety disorder)    -  Primary     Orthostatic dizziness         Long term current use of insulin  (HCC)           Type 2 diabetes mellitus with hyperglycemia Followed by [facility] Blood glucose improved with adjusted insulin . A1c at 8.3%. - Continue current recommendations per Endocrine with THREE TIMES A DAY  Novolog  15-20 units based on blood sugar and diet. And DAILY Tresiba  15u bedtime - Reviewed risks of hypoglycemia, he has GVOKE if needed based on prior order  - Ensure B12 supplement supply. OTC Prior lab reviewed. Will check again at future follow-up  Hyperventilation Syndrome Has upcoming visit with return to [facility] 05/08/24 Re-evaluation for other etiology that can affect his breathing and trigger his symptoms See prior notes, prior extensive work up inconclusive.  Hypertension Blood pressure controlled with current regimen. Recent reading 110/68 mmHg. - Continue losartan  25mg  (half of 50mg ) and hydrochlorothiazide  (half of 12.5mg ) As previously instructed Followed by Nephrology  Chronic kidney disease Stage IV Slightly decreased kidney function, no  acute changes. - Follow-up with nephrologist on 5/15 as scheduled  Squamous cell carcinoma of skin Post-Mohs surgery site healed, no new lesions. - Continue regular skin checks.  Goals of Care Prefers to avoid hospitalization, focused on wife's well-being. - Discuss advanced care planning and document preferences. - Consider home support resources for wife.        No orders of the defined types were placed in this encounter.   No orders of the defined types were placed in this encounter.   Follow up plan: Return in about 6 months (around 10/25/2024) for 6 month follow-up.    Domingo Friend, DO [facility] 04/24/2024, 2:50 PM

## 2024-04-25 ENCOUNTER — Other Ambulatory Visit (HOSPITAL_COMMUNITY): Payer: Self-pay

## 2024-05-09 LAB — HM DIABETES EYE EXAM

## 2024-05-21 DIAGNOSIS — Z86007 Personal history of in-situ neoplasm of skin: Secondary | ICD-10-CM

## 2024-05-21 HISTORY — DX: Personal history of in-situ neoplasm of skin: Z86.007

## 2024-05-22 ENCOUNTER — Ambulatory Visit: Payer: Medicare HMO | Admitting: Dermatology

## 2024-05-22 ENCOUNTER — Encounter: Payer: Self-pay | Admitting: Dermatology

## 2024-05-22 DIAGNOSIS — L570 Actinic keratosis: Secondary | ICD-10-CM | POA: Diagnosis not present

## 2024-05-22 DIAGNOSIS — L820 Inflamed seborrheic keratosis: Secondary | ICD-10-CM

## 2024-05-22 DIAGNOSIS — W908XXA Exposure to other nonionizing radiation, initial encounter: Secondary | ICD-10-CM

## 2024-05-22 DIAGNOSIS — L578 Other skin changes due to chronic exposure to nonionizing radiation: Secondary | ICD-10-CM | POA: Diagnosis not present

## 2024-05-22 DIAGNOSIS — Z8589 Personal history of malignant neoplasm of other organs and systems: Secondary | ICD-10-CM

## 2024-05-22 DIAGNOSIS — D0439 Carcinoma in situ of skin of other parts of face: Secondary | ICD-10-CM

## 2024-05-22 DIAGNOSIS — D492 Neoplasm of unspecified behavior of bone, soft tissue, and skin: Secondary | ICD-10-CM | POA: Diagnosis not present

## 2024-05-22 DIAGNOSIS — Z86007 Personal history of in-situ neoplasm of skin: Secondary | ICD-10-CM

## 2024-05-22 DIAGNOSIS — Z85828 Personal history of other malignant neoplasm of skin: Secondary | ICD-10-CM | POA: Diagnosis not present

## 2024-05-22 NOTE — Progress Notes (Unsigned)
 Follow-Up Visit   Subjective  Stephen White is a 88 y.o. male who presents for the following: Aks face, ears 61m f/u, hx of SCC R zygoma mohs 01/24/2024 The patient has spots, moles and lesions to be evaluated, some may be new or changing and the patient may have concern these could be cancer.  The following portions of the chart were reviewed this encounter and updated as appropriate: medications, allergies, medical history  Review of Systems:  No other skin or systemic complaints except as noted in HPI or Assessment and Plan.  Objective  Well appearing patient in no apparent distress; mood and affect are within normal limits.  A focused examination was performed of the following areas: Face, ears, hands, arms  Relevant exam findings are noted in the Assessment and Plan.  R inf cheek Crusted plaque  face, ears, hands x 26 (26) Pink scaly macules hands, arms x 16 (16) Stuck on waxy paps with erythema  Assessment & Plan   HISTORY OF SQUAMOUS CELL CARCINOMA OF THE SKIN - No evidence of recurrence today - No lymphadenopathy - Recommend regular full body skin exams - Recommend daily broad spectrum sunscreen SPF 30+ to sun-exposed areas, reapply every 2 hours as needed.  - Call if any new or changing lesions are noted between office visits - R zygoma mohs 01/2024, L dorsal hand, R cheek, R of midline forehead, R lat neck  HISTORY OF SQUAMOUS CELL CARCINOMA IN SITU OF THE SKIN - No evidence of recurrence today - Recommend regular full body skin exams - Recommend daily broad spectrum sunscreen SPF 30+ to sun-exposed areas, reapply every 2 hours as needed.  - Call if any new or changing lesions are noted between office visits  - R hand dorsum  HISTORY OF BASAL CELL CARCINOMA OF THE SKIN - No evidence of recurrence today - Recommend regular full body skin exams - Recommend daily broad spectrum sunscreen SPF 30+ to sun-exposed areas, reapply every 2 hours as needed.  - Call if  any new or changing lesions are noted between office visits  - L post inf ear, L lat forehead above the brow   NEOPLASM OF SKIN R inf cheek Epidermal / dermal shaving  Lesion diameter (cm):  1.8 Informed consent: discussed and consent obtained   Timeout: patient name, date of birth, surgical site, and procedure verified   Procedure prep:  Patient was prepped and draped in usual sterile fashion Prep type:  Isopropyl alcohol Anesthesia: the lesion was anesthetized in a standard fashion   Anesthetic:  1% lidocaine  w/ epinephrine  1-100,000 buffered w/ 8.4% NaHCO3 Instrument used: flexible razor blade   Hemostasis achieved with: pressure, aluminum chloride and electrodesiccation   Outcome: patient tolerated procedure well   Post-procedure details: sterile dressing applied and wound care instructions given   Dressing type: bandage and bacitracin    Destruction of lesion Complexity: extensive   Destruction method: electrodesiccation and curettage   Informed consent: discussed and consent obtained   Timeout:  patient name, date of birth, surgical site, and procedure verified Procedure prep:  Patient was prepped and draped in usual sterile fashion Prep type:  Isopropyl alcohol Anesthesia: the lesion was anesthetized in a standard fashion   Anesthetic:  1% lidocaine  w/ epinephrine  1-100,000 buffered w/ 8.4% NaHCO3 Curettage performed in three different directions: Yes   Electrodesiccation performed over the curetted area: Yes   Lesion length (cm):  1.8 Lesion width (cm):  1.8 Margin per side (cm):  0.2 Final  wound size (cm):  2.2 Hemostasis achieved with:  pressure, aluminum chloride and electrodesiccation Outcome: patient tolerated procedure well with no complications   Post-procedure details: sterile dressing applied and wound care instructions given   Dressing type: bandage and bacitracin   Specimen 1 - Surgical pathology Differential Diagnosis: R/O SCC  Check Margins:  yes Crusted plaque 1.8cm EDC AK (ACTINIC KERATOSIS) (26) face, ears, hands x 26 (26) Actinic keratoses are precancerous spots that appear secondary to cumulative UV radiation exposure/sun exposure over time. They are chronic with expected duration over 1 year. A portion of actinic keratoses will progress to squamous cell carcinoma of the skin. It is not possible to reliably predict which spots will progress to skin cancer and so treatment is recommended to prevent development of skin cancer.  Recommend daily broad spectrum sunscreen SPF 30+ to sun-exposed areas, reapply every 2 hours as needed.  Recommend staying in the shade or wearing long sleeves, sun glasses (UVA+UVB protection) and wide brim hats (4-inch brim around the entire circumference of the hat). Call for new or changing lesions. Destruction of lesion - face, ears, hands x 26 (26) Complexity: simple   Destruction method: cryotherapy   Informed consent: discussed and consent obtained   Timeout:  patient name, date of birth, surgical site, and procedure verified Lesion destroyed using liquid nitrogen: Yes   Region frozen until ice ball extended beyond lesion: Yes   Outcome: patient tolerated procedure well with no complications   Post-procedure details: wound care instructions given   INFLAMED SEBORRHEIC KERATOSIS (16) hands, arms x 16 (16) Symptomatic, irritating, patient would like treated. Destruction of lesion - hands, arms x 16 (16) Complexity: simple   Destruction method: cryotherapy   Informed consent: discussed and consent obtained   Timeout:  patient name, date of birth, surgical site, and procedure verified Lesion destroyed using liquid nitrogen: Yes   Region frozen until ice ball extended beyond lesion: Yes   Outcome: patient tolerated procedure well with no complications   Post-procedure details: wound care instructions given    ACTINIC DAMAGE - chronic, secondary to cumulative UV radiation exposure/sun exposure  over time - diffuse scaly erythematous macules with underlying dyspigmentation - Recommend daily broad spectrum sunscreen SPF 30+ to sun-exposed areas, reapply every 2 hours as needed.  - Recommend staying in the shade or wearing long sleeves, sun glasses (UVA+UVB protection) and wide brim hats (4-inch brim around the entire circumference of the hat). - Call for new or changing lesions.   Return in about 7 months (around 12/22/2024) for UBSE, Hx of BCC, Hx of SCC, Hx of AKs.  I, Rollie Clipper, RMA, am acting as scribe for Celine Collard, MD .   Documentation: I have reviewed the above documentation for accuracy and completeness, and I agree with the above.  Celine Collard, MD

## 2024-05-22 NOTE — Patient Instructions (Signed)

## 2024-05-24 LAB — SURGICAL PATHOLOGY

## 2024-05-25 ENCOUNTER — Ambulatory Visit: Payer: Self-pay | Admitting: Dermatology

## 2024-05-28 ENCOUNTER — Encounter: Payer: Self-pay | Admitting: Dermatology

## 2024-05-28 NOTE — Telephone Encounter (Addendum)
 Tried calling patient regarding results. No answer. Unable to leave message. ----- Message from Celine Collard sent at 05/25/2024 11:41 AM EDT ----- FINAL DIAGNOSIS        1. Skin, R inf cheek :       EXCORIATED, ULCERATED SQUAMOUS CELL CARCINOMA IN SITU (TREATED WITH ED&C)   Cancer = SCC in situ Superficial Already treated Check next visit

## 2024-05-28 NOTE — Telephone Encounter (Addendum)
 Called patient again. Spoke with patient regarding bx results. Patient verbalized understanding and denied further questions.   ----- Message from Celine Collard sent at 05/25/2024 11:41 AM EDT ----- FINAL DIAGNOSIS        1. Skin, R inf cheek :       EXCORIATED, ULCERATED SQUAMOUS CELL CARCINOMA IN SITU (TREATED WITH ED&C)   Cancer = SCC in situ Superficial Already treated Check next visit

## 2024-06-04 ENCOUNTER — Other Ambulatory Visit (INDEPENDENT_AMBULATORY_CARE_PROVIDER_SITE_OTHER): Payer: Self-pay | Admitting: Pharmacist

## 2024-06-04 DIAGNOSIS — Z794 Long term (current) use of insulin: Secondary | ICD-10-CM

## 2024-06-04 DIAGNOSIS — E1121 Type 2 diabetes mellitus with diabetic nephropathy: Secondary | ICD-10-CM

## 2024-06-04 NOTE — Patient Instructions (Signed)
 Goals Addressed             This Visit's Progress    Pharmacy Goals       If you need to reach out to patient assistance programs regarding refills or to find out the status of your application, you can do so by calling:  [name] at [phone]  Our goal A1c is less than 7%. This corresponds with fasting sugars less than 130 and 2 hour after meal sugars less than 180. Please continue to monitor home blood sugar.  Please check your home blood pressure, keep a log of the results and have this record for [place] to review during our next telephone appointment  Feel free to call me with any questions or concerns. I look forward to our next call!  Estelle Grumbles, PharmD, P[place]y [place], CPP Clinical Pharmacist [facility] [phone]

## 2024-06-04 NOTE — Progress Notes (Signed)
 06/04/2024 Name: Stephen White MRN: [id number] DOB: Jul 19, 1936  Chief Complaint  Patient presents with   Medication Management   Medication Assistance    Stephen White is a 88 y.o. year old male who presented for a telephone visit.   They were referred to the pharmacist by their PCP for assistance in managing diabetes, hypertension, hyperlipidemia, and medication access.      Subjective:   Care Team: Primary Care Provider: Raina Bunting, DO; Next Scheduled Visit: 10/26/2024 Cardiologist: Stephen Brace, MD Endocrinologist Stephen White, [place]; Next Scheduled Visit: 11/19/2024 Pulmonologist: Stephen Genta, NP Nephrologist: Worthy Heads, MD; Next Scheduled Visit: 06/12/2024 Dermatologist: Stephen Halter, MD Neurologist: Stephen Lora, MD; Next Scheduled Visit: 07/09/2024  Medication Access/Adherence  Current Pharmacy:  [id number] - [place], [place] - [address] [place] [address] Phone: [phone] Fax: [phone]  [facility] - [place], [place] - [address] [place] [address] Phone: [phone] Fax: [phone]  [id number] [place], [place] - [address] AT [facility] [address] [place] [address] [place] [address] Phone: [phone] Fax: [phone]  [facility] [address] [place] [address] Phone: [phone] Fax: [phone]   Patient reports affordability concerns with their medications: No  Patient reports access/transportation concerns to their pharmacy: No  Patient reports adherence concerns with their medications:  No       Diabetes:   Patient followed by [facility] End[facility]inology   Current medications:  Tresiba  15 units nightly NovoLog  15-20 units with meals as directed by Endocrinologist   Patient using FreeStyle Libre 3 continuous glucose  monitor (CGM)   Reports reading today after breakfast: 190   Denies symptoms of hypoglycemia, such as shaking, sweating or dizziness - Note has glucose tablets that he carries with him to use if needed for hypoglycemia   Current medication access support: Patient enrolled in [name] patient assistance program for Tresiba  and Novolog  through 12/19/2024   Objective:  Lab Results  Component Value Date   HGBA1C 8.7 04/13/2022    Lab Results  Component Value Date   CREATININE 2.57 (H) 03/23/2024   BUN 45 (H) 03/23/2024   NA 138 03/23/2024   K 4.3 03/23/2024   CL 107 03/23/2024   CO2 24 03/23/2024    Lab Results  Component Value Date   CHOL 152 08/01/2020   HDL 56 08/01/2020   LDLCALC 82 08/01/2020   TRIG 68 08/01/2020   CHOLHDL 2.7 08/01/2020    Medications Reviewed Today     Reviewed by Stephen White, RPH-CPP (Pharmacist) on 06/04/24 at 1332  Med List Status: <None>   Medication Order Taking? Sig Documenting Provider Last Dose Status Informant  ALPRAZolam  (XANAX ) 0.5 MG tablet [id number]  Take 1 tablet (0.5 mg total) by mouth 2 (two) times daily as needed for anxiety. May start with half pill for 0.25mg  dose. May increase in future if needed. Stephen Bunting, DO  Active Self  aspirin  81 MG EC tablet [id number]  Take 81 mg by mouth daily [provider]  Active Self  Blood Glucose Monitoring Suppl (ONETOUCH VERIO FLEX SYSTEM) w/Device KIT [id number]  Use to check blood sugar up to twice daily as directed Stephen Bunting, DO  Active Self  Carboxymethylcellulose Sodium (EYE DROPS OP) [id number]  Apply to eye. [provider]  Active Self   Patient not taking:   Discontinued 06/04/24 1329   Discontinued 06/04/24 1327 (Change in therapy) Continuous Glucose Sensor (FREESTYLE LIBRE 3 SENSOR) MISC [id number] Yes 1 each by Does not apply route every 14 (fourteen) days. [provider]  Active   gabapentin  (NEURONTIN ) 300 MG capsule  [id number]  Take 300 mg by mouth as needed.  Patient not taking: Reported on 04/24/2024   [provider]  Active Self  glucose blood (ONETOUCH VERIO) test strip [id number]  Use to check blood sugar twice daily Stephen Bunting, DO  Active Self  GVOKE HYPOPEN  2-PACK 1 MG/0.2ML Stephen White [id number]  Inject 1 mg into the skin as needed (hypoglycemia). Stephen Bunting, DO  Active Self  hydrochlorothiazide  (HYDRODIURIL ) 12.5 MG tablet [id number]  Take half tab daily Stephen Bunting, DO  Active   insulin  aspart (NOVOLOG ) 100 UNIT/ML injection [id number] Yes Inject 15-20 Units into the skin 3 (three) times daily before meals. [provider]  Active   insulin  degludec (TRESIBA ) 100 UNIT/ML FlexTouch Pen [id number] Yes Inject 15 Units into the skin at bedtime. [provider]  Active Self  Lancets Stephen White [place] PLUS [place]) MISC [id number]  Use to check blood sugar twice daily Stephen Bunting, DO  Active Self  levalbuterol  (XOPENEX  HFA) 45 MCG/ACT inhaler [id number]  Inhale 1 puff into the lungs every 6 (six) hours as needed for wheezing.  Patient not taking: Reported on 04/24/2024   White, Macdonald Savoy, NP  Active Self  losartan  (COZAAR ) 50 MG tablet [id number]  Take 0.5 tablets (25 mg total) by mouth daily. Stephen Bunting, DO  Active   Multiple Vitamins-Minerals (PX COMPLETE SENIOR MULTIVITS) TABS [id number]  Take by mouth. [provider]  Active Self           Med Note Stephen White, Stephen White   Fri Mar 30, 2024 11:09 AM) PRESERVISION IN IT   SALINE MIST Stephen White [id number]  Place into the nose. [provider]  Active Self  vitamin B-12 (CYANOCOBALAMIN) 500 MCG tablet [id number]  Take 500 mcg by mouth daily. [provider]  Active Self              Assessment/Plan:   Diabetes: - Have counseled on s/s of low blood sugar, prevention of lows and how to manage lows Patient using Freestyle Libre 3 Plus monitor for  feedback on dietary choices Patient carries glucose tablets to have in case of hypoglycemia - Recommend to continue to use Freestyle Libre CGM to monitor blood sugar/as feedback on dietary choices - Recommend to check glucose with fingerstick check when needed for symptoms and as back up to CGM. Patient to contact office if needed for readings outside of established parameters or symptoms - Patient to follow up with [name] patient assistance program as needed for refills of his Novolog  and Tresiba    Follow Up Plan: Clinical Pharmacist will follow up with patient by telephone on 10/01/2024 at 1:30 PM      Arthur Lash, PharmD, Becky Bowels, CPP Clinical Pharmacist [facility] [phone]

## 2024-08-17 ENCOUNTER — Ambulatory Visit: Admitting: Family Medicine

## 2024-08-22 ENCOUNTER — Ambulatory Visit: Admitting: Family Medicine

## 2024-08-22 ENCOUNTER — Encounter: Payer: Self-pay | Admitting: Family Medicine

## 2024-08-22 VITALS — BP 132/58 | HR 65 | Ht 66.0 in | Wt 185.0 lb

## 2024-08-22 DIAGNOSIS — J449 Chronic obstructive pulmonary disease, unspecified: Secondary | ICD-10-CM

## 2024-08-22 DIAGNOSIS — M25511 Pain in right shoulder: Secondary | ICD-10-CM

## 2024-08-22 DIAGNOSIS — M7551 Bursitis of right shoulder: Secondary | ICD-10-CM

## 2024-08-22 DIAGNOSIS — G8929 Other chronic pain: Secondary | ICD-10-CM

## 2024-08-22 DIAGNOSIS — J4540 Moderate persistent asthma, uncomplicated: Secondary | ICD-10-CM | POA: Diagnosis not present

## 2024-08-22 DIAGNOSIS — F458 Other somatoform disorders: Secondary | ICD-10-CM

## 2024-08-22 MED ORDER — ALBUTEROL SULFATE HFA 108 (90 BASE) MCG/ACT IN AERS: 2.0000 | INHALATION_SPRAY | RESPIRATORY_TRACT | 3 refills | Status: AC | PRN

## 2024-08-22 NOTE — Progress Notes (Signed)
 Subjective:    Patient ID: Stephen White, male    DOB: 1936-02-13, 88 y.o.   MRN: [id number]  Stephen White is a 88 y.o. male presenting on 08/22/2024 for Emesis  Patient presents for a same day appointment.   HPI  Discussed the use of AI scribe software for clinical note transcription with the patient, who gave verbal consent to proceed.  History of Present Illness   [name] is an 88 year old male who presents with shoulder pain and respiratory issues.  Right shoulder pain, acute on chronic Known history of bursitis / rotator cuff tendonitis New flare Onset since Sunday or Monday night - Attributes pain to sleeping on right shoulder - No specific injury recalled - Similar episode in the past - Uses ibuprofen  and Voltaren gel for relief - Typically avoids pain medication unless necessary, prefers Tylenol  - Recently used ibuprofen  for pain but he cannot take this long due to interactions and risk side effects - Considers using gabapentin  for shoulder pain, which he uses for foot pain - No prior injection  Hyperventilation Chronic Hyperventilation Asthma CAFL Previously followed by Pulm 2024, they no longer see him, nothing else to offer - History of using albuterol  and Xopenex  inhalers - Currently without any rescue inhalers Request re order albuterol   Nausea / Gastrointestinal symptoms - Two episodes of nausea in the past two to three months - First episode occurred after eating pizza with mushrooms and peppers - Second episode occurred after eating a large hamburger from [employer] - Nausea resolved with medication provided by his daughter - Severe episode of abdominal pain occurred when loosening his belt, not associated with nausea          03/30/2024   11:10 AM 04/29/2023    9:54 AM 02/25/2023    3:23 PM  Depression screen PHQ 2/9  Decreased Interest 0 0 0  Down, Depressed, Hopeless 0 0 0  PHQ - 2 Score 0 0 0  Altered sleeping 0 2 0  Tired,  decreased energy 0 3 0  Change in appetite 0 0 0  Feeling bad or failure about yourself  0 0 0  Trouble concentrating 0 2 0  Moving slowly or fidgety/restless 0 2 0  Suicidal thoughts 0 0 0  PHQ-9 Score 0 9 0  Difficult doing work/chores Not difficult at all Not difficult at all Not difficult at all       04/29/2023    9:54 AM  GAD 7 : Generalized Anxiety Score  Nervous, Anxious, on Edge 3  Control/stop worrying 0  Worry too much - different things 0  Trouble relaxing 0  Restless 0  Easily annoyed or irritable 2  Afraid - awful might happen 0  Total GAD 7 Score 5    Social History   Tobacco Use   Smoking status: Former    Current packs/day: 0.00    Average packs/day: 0.3 packs/day for 25.0 years (7.5 ttl pk-yrs)    Types: Cigarettes    Start date: 12/19/1953    Quit date: 12/19/1978    Years since quitting: 45.7   Smokeless tobacco: Former  Building services engineer status: Never Used  Substance Use Topics   Alcohol use: Yes    Alcohol/week: 0.0 standard drinks of alcohol   Drug use: No    Review of Systems Per HPI unless specifically indicated above     Objective:    BP (!) 132/58 (BP Location: Left Arm, Patient  Position: Sitting, Cuff Size: Normal)   Pulse 65   Ht 5' 6 (1.676 m)   Wt 185 lb (83.9 kg)   SpO2 97%   BMI 29.86 kg/m   Wt Readings from Last 3 Encounters:  08/22/24 185 lb (83.9 kg)  04/24/24 187 lb (84.8 kg)  03/22/24 188 lb 0.8 oz (85.3 kg)    Physical Exam Vitals and nursing note reviewed.  Constitutional:      General: He is not in acute distress.    Appearance: Normal appearance. He is well-developed. He is not diaphoretic.     Comments: Elderly 88 yr male, cooperative  HENT:     Head: Normocephalic and atraumatic.  Eyes:     General:        Right eye: No discharge.        Left eye: No discharge.     Conjunctiva/sclera: Conjunctivae normal.  Cardiovascular:     Rate and Rhythm: Normal rate.  Pulmonary:     Comments: Baseline  hyperventilation, no change today Musculoskeletal:     Comments: R Shoulder reduced ROM active flex abduction pain. Requires passive range of motion with other arm or assistance. Not able to fully test rotator cuff strength testing today, provoked with impingement testing. Known issue with this shoulder.  Skin:    General: Skin is warm and dry.     Findings: No erythema or rash.  Neurological:     Mental Status: He is alert and oriented to person, place, and time.  Psychiatric:        Mood and Affect: Mood normal.        Behavior: Behavior normal.        Thought Content: Thought content normal.     Comments: Well groomed, good eye contact, normal speech and thoughts     Results for orders placed or performed in visit on 05/22/24  Surgical pathology   Collection Time: 05/22/24 12:00 AM  Result Value Ref Range   SURGICAL PATHOLOGY      SURGICAL PATHOLOGY [facility] [address] [place], [place] [address] Telephone [phone] or [phone] Fax [phone]  REPORT OF DERMATOPATHOLOGY   Accession #: [id number] Patient Name: Stephen, White Visit # : [id number]  MRN: [id number] Physician: Hester Lenis DOB/Age [birth year] (Age: 22) Gender: M Collected Date: 05/22/2024 Received Date: 05/22/2024  FINAL DIAGNOSIS       1. Skin, R inf cheek :       EXCORIATED, ULCERATED SQUAMOUS CELL CARCINOMA IN SITU (TREATED WITH ED&C)       DATE SIGNED OUT: 05/24/2024 ELECTRONIC SIGNATURE : DIONA M.D., HONGYU, Dermatopathologist  MICROSCOPIC DESCRIPTION 1. The epidermis exhibits full thickness keratinocytic atypia. This is a squamous cell carcinoma in situ. Base is transected.  CASE COMMENTS STAINS USED IN DIAGNOSIS: H&E    CLINICAL HISTORY  SPECIMEN(S) OBTAINED 1. Skin, R Inf Cheek  SPECIMEN COMMENTS: 1. Crusted plaque 1.8cm, check margins, EDC SPECIMEN CLI NICAL INFORMATION: 1. Neoplasm of skin, R/O SCC    Gross Description 1.  Formalin fixed specimen received:  11 X 8 X 1 MM, TOTO (4 P) (1 B) ( ew ) margins inked.        Report signed out from the following location(s) [place] [address] [place], [place] [address] CLIA #: [id number]  [facility] [address] [place], [place] [address] CLIA #: [id number]       Assessment & Plan:   Problem List  Items Addressed This Visit     Asthma   Relevant Medications   albuterol  (VENTOLIN  HFA) 108 (90 Base) MCG/ACT inhaler   CAFL (chronic airflow limitation) (HCC) - Primary   Relevant Medications   albuterol  (VENTOLIN  HFA) 108 (90 Base) MCG/ACT inhaler   Hyperventilation syndrome   Other Visit Diagnoses       Chronic right shoulder pain         Bursitis of right shoulder           Right shoulder rotator cuff tendinopathy and bursitis Chronic problem now with Acute flare of rotator cuff. Painful to lift shoulder, manageable once elevated. Intolerant to prednisone  in past. - Use topical Voltaren for pain relief. - Consider gabapentin  for shoulder pain if needed. Use existing medication contact back if need new order. - If pain persists, consider stronger pain medication or schedule a steroid joint injection into the right shoulder. - Provide stretching exercises for shoulder mobility. AVS  Asthma persistent Hyperventilation Syndrome Previously followed by various specialists Pulmonology included, last visit 2024, ultimately extensive work up and unable to diagnose his breathing issue. He requests a refill on Albuterol . Has had other Xopenex  inhaler in past but no longer recalls this one. Does not have a neb currently - Order albuterol  inhaler for rescue use as needed.  Recurrent nausea, likely food-related Episodes likely triggered by specific food triggers recently has resolved - Use Zofran  or other nausea medication as needed.  Offer refill he has enough med from previous episode       No orders of the  defined types were placed in this encounter.   Meds ordered this encounter  Medications   albuterol  (VENTOLIN  HFA) 108 (90 Base) MCG/ACT inhaler    Sig: Inhale 2 puffs into the lungs every 4 (four) hours as needed for wheezing or shortness of breath.    Dispense:  8 g    Refill:  3    Follow up plan: Return if symptoms worsen or fail to improve.   Marsa Officer, DO [facility] 08/22/2024, 3:30 PM

## 2024-08-22 NOTE — Patient Instructions (Addendum)
 Thank you for coming to the office today.  Use Zofran  or nausea medication as needed, agree that likely it was triggered nausea episodes.  Right Shoulder seems to be bursitis or rotator cuff flare  Normally we would do prednisone , but if you cannot take this, we are limited.  START anti inflammatory topical - OTC Voltaren (generic Diclofenac) topical 2-4 times a day as needed for pain swelling of affected joint for 1-2 weeks or longer.  Okay to take Gabapentin  for shoulder pain  Call back if it is not strong enough to help the shoulder. - we can order stronger dose or other pill  If still not improving we can schedule a steroid joint injection into RIGHT SHOULDER if need.  ------------------  Albuterol  inhaler rescue as needed for breathing  Range of Motion Shoulder Exercises  Pendulum Circles - Lean with your good arm against a counter or table for support - Bend forward with a wide stance (make sure your body is comfortable) - Your painful shoulder should hang down and feel heavy - Gently move your painful arm in small circles clockwise for several turns - Switch to counterclockwise for several turns - Early on keep circles narrow and move slowly - Later in rehab, move in larger circles and faster movement   Wall Crawl - Stand close (about 1-2 ft away) to a wall, facing it directly - Reach out with your arm of painful shoulder and place fingers (not palm) on wall - You should make contact with wall at your waist level - Slowly walk your fingers up the wall. Stay in contact with wall entire time, do not remove fingers - Keep walking fingers up wall until you reach shoulder level - You may feel tightening or mild discomfort, once you reach a height that causes pain or if you are already above your shoulder height then stop. Repeat from starting position. - Early on stand closer to wall, move fingers slowly, and stay at or below shoulder level - Later in rehab, stand  farther away from wall (fingertips), move fingers quicker, go above shoulder level     Please schedule a Follow-up Appointment to: Return if symptoms worsen or fail to improve.  If you have any other questions or concerns, please feel free to call the office or send a message through MyChart. You may also schedule an earlier appointment if necessary.  Additionally, you may be receiving a survey about your experience at our office within a few days to 1 week by e-mail or mail. We value your feedback.  Marsa Officer, DO [facility], [place]

## 2024-08-31 NOTE — Progress Notes (Signed)
 Cardiology Office Note  Date:  09/03/2024   ID:  Stephen White, DOB [birth year], MRN [id number]  PCP:  Stephen Marsa PARAS, DO   Chief Complaint  Patient presents with   New Patient (Initial Visit)    Ref by Dr. Edman for HTN and shortness of breath. Patient c/o shortness of breath, racing heart beats at times & chest pain/discomfort.     HPI:  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., CR 2.5, up from 2./1 six years ago Former smoker, 25 years Who presents for follow-up of his dizziness, leg weakness, SOB, palpitations  On today's visit he presents with his family He reports having chronic SOB, dating back 3-4 years  Last seen by myself in clinic May 2021 Seen by [facility] cardiology March 2023 For shortness of breath  Echo performed 3/23 which was essentially normal Followed by pulmonary for shortness of breath, last seen 1 year ago Sedentary at baseline Walks in house, gets mail No regular exercise program Reports that he gives out quickly, does not go shopping much Gait instability, no falls  Reports most of his activity is taking care of his wife who is disabled He has to push her wheelchair around  Labs reviewed A1C 8.3 CR 2.67, GFR 23  EKG personally reviewed by myself on todays visit EKG Interpretation Date/Time:  Monday September 03 2024 15:58:42 EDT Ventricular Rate:  63 PR Interval:  198 QRS Duration:  74 QT Interval:  404 QTC Calculation: 413 R Axis:   -8  Text Interpretation: Normal sinus rhythm Normal ECG When compared with ECG of 22-Mar-2024 12:21, No significant change was found Confirmed by Stephen White [phone]) on 09/03/2024 5:57:45 PM     PMH:   has a past medical history of Actinic keratosis, Allergy, Basal cell carcinoma (08/29/2018), Basal cell carcinoma (08/05/2020), Chronic kidney disease, GERD (gastroesophageal reflux disease), History of  squamous cell carcinoma in situ (SCCIS) (05/21/2024), Hyperlipidemia, Hypertension, SCC (squamous cell carcinoma) (12/22/2023), Squamous cell carcinoma of skin (03/26/2015), Squamous cell carcinoma of skin (06/17/2015), Squamous cell carcinoma of skin (05/10/2019), Squamous cell carcinoma of skin (08/05/2020), Squamous cell carcinoma of skin (10/03/2023), Thyroid  disease, Type 2 diabetes mellitus (HCC), Vertigo, and Wears hearing aid in both ears.  PSH:    Past Surgical History:  Procedure Laterality Date   CATARACT EXTRACTION W/PHACO Left 04/03/2021   Procedure: CATARACT EXTRACTION PHACO AND INTRAOCULAR LENS PLACEMENT (IOC) LEFT DIABETIC 17.22 01:53.3 15.2%;  Surgeon: Stephen Gaskin, MD;  [facility];  Service: Ophthalmology;  Laterality: Left;  Diabetic - insulin    EYE SURGERY      Current Outpatient Medications  Medication Sig Dispense Refill   albuterol  (VENTOLIN  HFA) 108 (90 Base) MCG/ACT inhaler Inhale 2 puffs into the lungs every 4 (four) hours as needed for wheezing or shortness of breath. 8 g 3   ALPRAZolam  (XANAX ) 0.5 MG tablet Take 1 tablet (0.5 mg total) by mouth 2 (two) times daily as needed for anxiety. May start with half pill for 0.25mg  dose. May increase in future if needed. 30 tablet 2   aspirin  81 MG EC tablet Take 81 mg by mouth daily     Blood Glucose Monitoring Suppl (ONETOUCH VERIO FLEX SYSTEM) w/Device KIT Use to check blood sugar up to twice daily as directed 1 kit 0   Carboxymethylcellulose Sodium (EYE DROPS OP) Apply to eye.     Continuous Glucose Sensor (FREESTYLE [place]  3 SENSOR) MISC 1 each by Does not apply route every 14 (fourteen) days.     gabapentin  (NEURONTIN ) 300 MG capsule Take 300 mg by mouth as needed.     glucose blood (ONETOUCH VERIO) test strip Use to check blood sugar twice daily 200 each 3   GVOKE HYPOPEN  2-PACK 1 MG/0.2ML SOAJ Inject 1 mg into the skin as needed (hypoglycemia). 1 mL 2   hydrochlorothiazide  (HYDRODIURIL ) 12.5 MG  tablet Take half tab daily     insulin  aspart (NOVOLOG ) 100 UNIT/ML injection Inject 15-20 Units into the skin 3 (three) times daily before meals.     insulin  degludec (TRESIBA ) 100 UNIT/ML FlexTouch Pen Inject 15 Units into the skin at bedtime.     Lancets (ONETOUCH DELICA PLUS LANCET30G) MISC Use to check blood sugar twice daily 200 each 3   losartan  (COZAAR ) 50 MG tablet Take 0.5 tablets (25 mg total) by mouth daily.     Multiple Vitamins-Minerals (PX COMPLETE SENIOR MULTIVITS) TABS Take by mouth.     SALINE MIST SPRAY NA Place into the nose.     vitamin B-12 (CYANOCOBALAMIN) 500 MCG tablet Take 500 mcg by mouth daily.     No current facility-administered medications for this visit.     Allergies:   Patient has no known allergies.   Social History:  The patient  reports that he quit smoking about 45 years ago. His smoking use included cigarettes. He started smoking about 70 years ago. He has a 7.5 pack-year smoking history. He has quit using smokeless tobacco. He reports current alcohol use. He reports that he does not use drugs.   Family History:   family history includes Cancer in his brother; Diabetes in his brother, brother, brother, brother, brother, father, mother, sister, and sister; Stroke in his father.    Review of Systems: Review of Systems  Constitutional: Negative.   HENT: Negative.    Eyes:  Positive for blurred vision.  Respiratory: Negative.    Cardiovascular: Negative.   Gastrointestinal: Negative.   Musculoskeletal: Negative.        Leg weakness when standing in 1 place  Neurological: Negative.   Psychiatric/Behavioral: Negative.    All other systems reviewed and are negative.   PHYSICAL EXAM: VS:  BP (!) 144/60 (BP Location: Right Arm, Patient Position: Sitting, Cuff Size: Normal)   Pulse 63   Ht 5' 6 (1.676 m)   Wt 184 lb 4 oz (83.6 kg)   SpO2 96%   BMI 29.74 kg/m  , BMI Body mass index is 29.74 kg/m. Constitutional:  oriented to person, place, and  time. No distress.  HENT:  Head: Grossly normal Eyes:  no discharge. No scleral icterus.  Neck: No JVD, no carotid bruits  Cardiovascular: Regular rate and rhythm, no murmurs appreciated Pulmonary/Chest: Clear to auscultation bilaterally, no wheezes, +  rales at the bases worse on the right than the left Abdominal: Soft.  no distension.  no tenderness.  Musculoskeletal: Normal range of motion Neurological:  normal muscle tone. Coordination normal. No atrophy Skin: Skin warm and dry Psychiatric: normal affect, pleasant   Recent Labs: 03/22/2024: B Natriuretic Peptide 87.4 03/23/2024: BUN 45; Creatinine, Ser 2.57; Hemoglobin 12.4; Platelets 225; Potassium 4.3; Sodium 138    Lipid Panel Lab Results  Component Value Date   CHOL 152 08/01/2020   HDL 56 08/01/2020   LDLCALC 82 08/01/2020   TRIG 68 08/01/2020      Wt Readings from Last 3 Encounters:  09/03/24 184 lb 4  oz (83.6 kg)  08/22/24 185 lb (83.9 kg)  04/24/24 187 lb (84.8 kg)     ASSESSMENT AND PLAN:  Problem List Items Addressed This Visit       Cardiology Problems   HTN (hypertension)   Relevant Orders   EKG 12-Lead (Completed)   Chronic diastolic CHF (congestive heart failure) (HCC) - Primary   Relevant Orders   EKG 12-Lead (Completed)   HLD (hyperlipidemia)     Other   Type 2 diabetes mellitus with diabetic chronic kidney disease (HCC)   Shortness of breath Previously seen by pulmonary, hyperventilation part of his diagnosis Not on oxygen Sedentary, deconditioned On clinical exam with Rales at the bases, concern for fibrosis Discussed with Dr. Tamea, will order high-resolution chest CT scan with no contrast given his renal dysfunction - Unable to exclude ischemia, pharmacologic Myoview ordered He is unable to treadmill  Diabetes type 2 poorly controlled with nephropathy Working with endocrinology and primary care A1c running 8.0 Creatinine 2.6  Leg weakness/tremor Long history of debility,  sedentary lifestyle, Difficulty transitioning from chair to exam table on today's visit May benefit from PT  Hypertension Blood pressure is well controlled on today's visit. No changes made to the medications.   Signed, Velinda Lunger, M.D., Ph.D. [facility] [place], [place] [phone]

## 2024-09-03 ENCOUNTER — Other Ambulatory Visit: Payer: Self-pay | Admitting: Family Medicine

## 2024-09-03 ENCOUNTER — Ambulatory Visit: Attending: Cardiovascular Disease | Admitting: Cardiovascular Disease

## 2024-09-03 ENCOUNTER — Encounter: Payer: Self-pay | Admitting: Cardiovascular Disease

## 2024-09-03 VITALS — BP 144/60 | HR 63 | Ht 66.0 in | Wt 184.2 lb

## 2024-09-03 DIAGNOSIS — R0989 Other specified symptoms and signs involving the circulatory and respiratory systems: Secondary | ICD-10-CM

## 2024-09-03 DIAGNOSIS — I129 Hypertensive chronic kidney disease with stage 1 through stage 4 chronic kidney disease, or unspecified chronic kidney disease: Secondary | ICD-10-CM

## 2024-09-03 DIAGNOSIS — Z794 Long term (current) use of insulin: Secondary | ICD-10-CM

## 2024-09-03 DIAGNOSIS — J8410 Pulmonary fibrosis, unspecified: Secondary | ICD-10-CM

## 2024-09-03 DIAGNOSIS — R0602 Shortness of breath: Secondary | ICD-10-CM

## 2024-09-03 DIAGNOSIS — I5032 Chronic diastolic (congestive) heart failure: Secondary | ICD-10-CM | POA: Diagnosis not present

## 2024-09-03 DIAGNOSIS — E1122 Type 2 diabetes mellitus with diabetic chronic kidney disease: Secondary | ICD-10-CM | POA: Diagnosis not present

## 2024-09-03 DIAGNOSIS — I209 Angina pectoris, unspecified: Secondary | ICD-10-CM | POA: Diagnosis not present

## 2024-09-03 DIAGNOSIS — E782 Mixed hyperlipidemia: Secondary | ICD-10-CM

## 2024-09-03 DIAGNOSIS — I10 Essential (primary) hypertension: Secondary | ICD-10-CM

## 2024-09-03 DIAGNOSIS — N184 Chronic kidney disease, stage 4 (severe): Secondary | ICD-10-CM

## 2024-09-03 NOTE — Patient Instructions (Addendum)
 Medication Instructions:   No changes  If you need a refill on your cardiac medications before your next appointment, please call your pharmacy.   Lab work: No new labs needed  Testing/Procedures:  Your provider has ordered a Lexiscan/ Exercise Myoview Stress test. This will take place at [facility]. Please report to the [facility] medical mall entrance. The volunteers at the first desk will direct you where to go.  [facility] MYOVIEW  Your provider has ordered a Stress Test with nuclear imaging. The purpose of this test is to evaluate the blood supply to your heart muscle. This procedure is referred to as a Non-Invasive Stress Test. This is because other than having an IV started in your vein, nothing is inserted or invades your body. Cardiac stress tests are done to find areas of poor blood flow to the heart by determining the extent of coronary artery disease (CAD). Some patients exercise on a treadmill, which naturally increases the blood flow to your heart, while others who are unable to walk on a treadmill due to physical limitations will have a pharmacologic/chemical stress agent called Lexiscan . This medicine will mimic walking on a treadmill by temporarily increasing your coronary blood flow.   Please note: these test may take anywhere between 2-4 hours to complete  How to prepare for your Myoview test:  Nothing to eat for 6 hours prior to the test No caffeine for 24 hours prior to test No smoking 24 hours prior to test. Your medication may be taken with water.  If your doctor stopped a medication because of this test, do not take that medication. Ladies, please do not wear dresses.  Skirts or pants are appropriate. Please wear a short sleeve shirt. No perfume, cologne or lotion. Wear comfortable walking shoes. No heels!   PLEASE NOTIFY THE OFFICE AT LEAST 24 HOURS IN ADVANCE IF YOU ARE UNABLE TO KEEP YOUR APPOINTMENT.  [phone] AND  PLEASE NOTIFY NUCLEAR MEDICINE AT [facility] AT LEAST 24  HOURS IN ADVANCE IF YOU ARE UNABLE TO KEEP YOUR APPOINTMENT. [phone]   Follow-Up: At [facility], you and your health needs are our priority.  As part of our continuing mission to provide you with exceptional heart care, we have created designated Provider Care Teams.  These Care Teams include your primary Cardiologist (physician) and Advanced Practice Providers (APPs -  Physician Assistants and Nurse Practitioners) who all work together to provide you with the care you need, when you need it.  You will need a follow up appointment as needed  Providers on your designated Care Team:   Lonni Meager, NP Bernardino Bring, PA-C Cadence Franc[place]ster, [place]  COVID-19 Vaccine Information can be found at: [email or web address] For questions related to vaccine distribution or appointments, please email [email or web address] or call [phone].

## 2024-09-04 NOTE — Telephone Encounter (Signed)
 Requested medications are due for refill today.  unsure  Requested medications are on the active medications list.  yes  Last refill. 04/24/2024  Future visit scheduled.   yes  Notes to clinic.  Sig on med list differs from that on Rx request.    Requested Prescriptions  Pending Prescriptions Disp Refills   losartan  (COZAAR ) 50 MG tablet [Pharmacy Med Name: LOSARTAN  POTASSIUM 50 MG TAB] 90 tablet 3    Sig: TAKE 1 TABLET BY MOUTH EVERY DAY     Cardiovascular:  Angiotensin Receptor Blockers Failed - 09/04/2024  4:26 PM      Failed - Cr in normal range and within 180 days    Creat  Date Value Ref Range Status  02/24/2021 2.69 (H) 0.70 - 1.11 mg/dL Final    Comment:    For patients >54 years of age, the reference limit for Creatinine is approximately 13% higher for people identified as African-American. .    Creatinine, Ser  Date Value Ref Range Status  03/23/2024 2.57 (H) 0.61 - 1.24 mg/dL Final         Failed - Last BP in normal range    BP Readings from Last 1 Encounters:  09/03/24 (!) 144/60         Passed - K in normal range and within 180 days    Potassium  Date Value Ref Range Status  03/23/2024 4.3 3.5 - 5.1 mmol/L Final         Passed - Patient is not pregnant      Passed - Valid encounter within last 6 months    Recent Outpatient Visits           1 week ago CAFL (chronic airflow limitation) [facility])   [place] [facility] [place], Marsa PARAS, DO   4 months ago GAD (generalized anxiety disorder)   [place] [facility] Edman Marsa PARAS, DO   6 months ago GAD (generalized anxiety disorder)   [place] [facility] Edman Marsa PARAS, DO       Future Appointments             In 3 months Hester Alm BROCKS, MD [facility]

## 2024-09-12 ENCOUNTER — Other Ambulatory Visit: Payer: Self-pay | Admitting: Physician Assistant

## 2024-09-12 ENCOUNTER — Encounter
Admission: RE | Admit: 2024-09-12 | Discharge: 2024-09-12 | Disposition: A | Discharge status: Home / Self Care | Source: Ambulatory Visit | Attending: Cardiovascular Disease | Admitting: Cardiovascular Disease

## 2024-09-12 DIAGNOSIS — R0602 Shortness of breath: Secondary | ICD-10-CM | POA: Diagnosis not present

## 2024-09-12 DIAGNOSIS — I209 Angina pectoris, unspecified: Secondary | ICD-10-CM | POA: Diagnosis present

## 2024-09-12 LAB — NM MYOCAR MULTI W/SPECT W/WALL MOTION / EF
LV dias vol: 58 mL (ref 62–150)
LV sys vol: 23 mL (ref 4.2–5.8)
MPHR: 135 {beats}/min
Nuc Stress EF: 60 %
Peak HR: 90 {beats}/min
Percent HR: 66 %
Rest HR: 65 {beats}/min
Rest Nuclear Isotope Dose: 10.5 mCi
SDS: 0
SRS: 4
SSS: 0
ST Depression (mm): 0 mm
Stress Nuclear Isotope Dose: 32.8 mCi
TID: 1.27

## 2024-09-12 MED ORDER — TECHNETIUM TC 99M TETROFOSMIN IV KIT
30.0000 | PACK | Freq: Once | INTRAVENOUS | Status: AC | PRN
Start: 2024-09-12 — End: 2024-09-12
  Administered 2024-09-12: 32.84 via INTRAVENOUS

## 2024-09-12 MED ORDER — REGADENOSON 0.4 MG/5ML IV SOLN
0.4000 mg | Freq: Once | INTRAVENOUS | Status: AC
Start: 2024-09-12 — End: 2024-09-12
  Administered 2024-09-12: 0.4 mg via INTRAVENOUS

## 2024-09-12 MED ORDER — TECHNETIUM TC 99M TETROFOSMIN IV KIT
10.5000 | PACK | Freq: Once | INTRAVENOUS | Status: AC | PRN
Start: 2024-09-12 — End: 2024-09-12
  Administered 2024-09-12: 10.5 via INTRAVENOUS

## 2024-09-12 NOTE — Progress Notes (Signed)
     Stephen White presented for a nuclear stress test today.  I Lesley LITTIE Maffucci, PA-C, provided direct supervision and was present during the stress portion of the study today, which was completed without significant symptoms, immediate complications, or acute ST/T changes on ECG.  Stress imaging is pending at this time.  Preliminary ECG findings may be listed in the chart, but the stress test result will not be finalized until perfusion imaging is complete.  Lesley LITTIE Maffucci, PA-C  09/12/2024, 10:17 AM

## 2024-09-14 ENCOUNTER — Encounter: Payer: Self-pay | Admitting: Emergency Medicine

## 2024-09-14 ENCOUNTER — Ambulatory Visit: Payer: Self-pay | Admitting: Cardiovascular Disease

## 2024-09-19 ENCOUNTER — Telehealth: Payer: Self-pay | Admitting: Cardiovascular Disease

## 2024-09-19 NOTE — Telephone Encounter (Signed)
 [employer] automated system calling to inform pts procedure was not approved. Fax sent as well. Info below was stated for peer to peer appeal. Please advise.   Call back: [phone] Case #[id number]

## 2024-09-20 ENCOUNTER — Other Ambulatory Visit: Payer: Self-pay | Admitting: Emergency Medicine

## 2024-09-20 DIAGNOSIS — R0602 Shortness of breath: Secondary | ICD-10-CM

## 2024-09-20 DIAGNOSIS — J8410 Pulmonary fibrosis, unspecified: Secondary | ICD-10-CM

## 2024-09-21 ENCOUNTER — Ambulatory Visit

## 2024-10-01 ENCOUNTER — Encounter: Payer: Self-pay | Admitting: Cardiovascular Disease

## 2024-10-01 ENCOUNTER — Other Ambulatory Visit: Admitting: Pharmacist

## 2024-10-01 DIAGNOSIS — E1121 Type 2 diabetes mellitus with diabetic nephropathy: Secondary | ICD-10-CM

## 2024-10-01 DIAGNOSIS — Z794 Long term (current) use of insulin: Secondary | ICD-10-CM

## 2024-10-01 NOTE — Progress Notes (Signed)
 10/01/2024 Name: Stephen White MRN: [id number] DOB: 1936-03-17  Chief Complaint  Patient presents with   Medication Assistance    Stephen White is a 88 y.o. year old male who presented for a telephone visit.   They were referred to the pharmacist by their PCP for assistance in managing diabetes, hypertension, hyperlipidemia, and medication access.      Subjective:   Care Team: Primary Care Provider: [place] Marsa PARAS, DO; Next Scheduled Visit: 10/26/2024 Cardiologist: Ammon Marsa, MD Endocrinologist Brendia Calton Squires, [place]; Next Scheduled Visit: 11/19/2024 Pulmonologist: Orlie Madelin RAMAN, NP Nephrologist: Dominica Brandy, MD; Next Scheduled Visit: 10/09/2024 Dermatologist: Hester Alm BROCKS, MD Neurologist: Maree Jannett Hering, MD    Medication Access/Adherence  [facility] Pharmacy:  [phone] [place], [place] - [address] [place] [address] Phone: [phone] Fax: [phone]  [facility] - [place], [place] - [address] [place] [address] Phone: [phone] Fax: [phone]  [id number] [place], [place] - [address] AT [facility] OF [address] [place] [address] Phone: [phone] Fax: [phone]  [facility] - [facility] [address] [place] [address] Phone: [phone] Fax: [phone]   Patient reports affordability concerns with their medications: No  Patient reports access/transportation concerns to their pharmacy: No  Patient reports adherence concerns with their medications:  No       Diabetes:   Patient followed by [facility] End[facility]   Current medications:  Tresiba  15 units nightly NovoLog  15-20 units with meals as directed by Endocrinologist Notes that he is to skip dose if his blood sugar before meals is under 100    Patient using FreeStyle  Libre continuous glucose monitor (CGM) with reader device   Recalls reading today before breakfast was a bit above 100   Denies symptoms of hypoglycemia, such as shaking, sweating or dizziness - Note has glucose tablets that he carries with him to use if needed for hypoglycemia   Current medication access support: Patient enrolled in [name] patient assistance program for Tresiba  and Novolog  through 12/19/2024   Objective:  Lab Results  Component Value Date   HGBA1C 8.7 04/13/2022    Lab Results  Component Value Date   CREATININE 2.57 (H) 03/23/2024   BUN 45 (H) 03/23/2024   NA 138 03/23/2024   K 4.3 03/23/2024   CL 107 03/23/2024   CO2 24 03/23/2024    Lab Results  Component Value Date   CHOL 152 08/01/2020   HDL 56 08/01/2020   LDLCALC 82 08/01/2020   TRIG 68 08/01/2020   CHOLHDL 2.7 08/01/2020    Current Outpatient Medications on File Prior to Visit  Medication Sig Dispense Refill   insulin  degludec (TRESIBA ) 100 UNIT/ML FlexTouch Pen Inject 15 Units into the skin at bedtime.     albuterol  (VENTOLIN  HFA) 108 (90 Base) MCG/ACT inhaler Inhale 2 puffs into the lungs every 4 (four) hours as needed for wheezing or shortness of breath. 8 g 3   ALPRAZolam  (XANAX ) 0.5 MG tablet Take 1 tablet (0.5 mg total) by mouth 2 (two) times daily as needed for anxiety. May start with half pill for 0.25mg  dose. May increase in future if needed. 30 tablet 2   aspirin  81 MG EC tablet Take 81 mg by mouth daily     Blood Glucose Monitoring Suppl (ONETOUCH VERIO FLEX SYSTEM)  w/Device KIT Use to check blood sugar up to twice daily as directed 1 kit 0   Carboxymethylcellulose Sodium (EYE DROPS OP) Apply to eye.     Continuous Glucose Sensor (FREESTYLE [place]RE 3 SENSOR) MISC 1 each by Does not apply route every 14 (fourteen) days.     gabapentin  (NEURONTIN ) 300 MG capsule Take 300 mg by mouth as needed.     glucose blood (ONETOUCH VERIO) test strip Use to check blood sugar twice daily 200 each  3   GVOKE HYPOPEN  2-PACK 1 MG/0.2ML SOAJ Inject 1 mg into the skin as needed (hypoglycemia). 1 mL 2   hydrochlorothiazide  (HYDRODIURIL ) 12.5 MG tablet Take half tab daily     insulin  aspart (NOVOLOG ) 100 UNIT/ML injection Inject 15-20 Units into the skin 3 (three) times daily before meals.     Lancets (ONETOUCH DELICA PLUS LANCET30G) MISC Use to check blood sugar twice daily 200 each 3   losartan  (COZAAR ) 50 MG tablet TAKE 1 TABLET BY MOUTH EVERY DAY 90 tablet 3   Multiple Vitamins-Minerals (PX COMPLETE SENIOR MULTIVITS) TABS Take by mouth.     SALINE MIST SPRAY NA Place into the nose.     vitamin B-12 (CYANOCOBALAMIN) 500 MCG tablet Take 500 mcg by mouth daily.     No current facility-administered medications on file prior to visit.      Assessment/Plan:   Diabetes: - Have counseled on s/s of low blood sugar, prevention of lows and how to manage lows Patient using Freestyle Libre 3 Plus monitor for feedback on dietary choices Patient carries glucose tablets to have in case of hypoglycemia - Recommend to continue to use Freestyle Libre CGM to monitor blood sugar/as feedback on dietary choices -Recommend to check glucose with fingerstick check when needed for symptoms and as back up to CGM.  Patient to contact office if needed for readings outside of established parameters or symptoms - Patient to follow up with [name] patient assistance program as needed for refills of his Novolog  and Tresiba  - Will collaborate with [facility] [place] to request support to patient with applying for re-enrollment in [name] patient assistance program   Follow Up Plan: Clinical Pharmacist will follow up with patient by telephone in December     Sharyle Sia, PharmD, JAQUELINE, CPP Clinical Pharmacist [facility] [phone]

## 2024-10-01 NOTE — Patient Instructions (Signed)
 Goals Addressed             This Visit's Progress    Pharmacy Goals       If you need to reach out to patient assistance programs regarding refills or to find out the status of your application, you can do so by calling:  [name] at [phone]  Our goal A1c is less than 7%. This corresponds with fasting sugars less than 130 and 2 hour after meal sugars less than 180. Please continue to monitor home blood sugar.  Please check your home blood pressure, keep a log of the results and have this record for [place] to review during our next telephone appointment  Feel free to call me with any questions or concerns. I look forward to our next call!  Estelle Grumbles, PharmD, P[place]y [place], CPP Clinical Pharmacist [facility] [phone]

## 2024-10-05 ENCOUNTER — Telehealth: Payer: Self-pay

## 2024-10-05 NOTE — Telephone Encounter (Signed)
 PAP: Patient assistance application for Novolog  and Tresiba  through [name] has been mailed to pt's home address on file. Provider portion of application will be faxed to provider's office. Patient portion e-filed.

## 2024-10-10 NOTE — Telephone Encounter (Signed)
 PAP: Application for Novolog  and Tresiba  has been submitted to [name], via fax

## 2024-10-23 ENCOUNTER — Other Ambulatory Visit: Payer: Self-pay | Admitting: Family Medicine

## 2024-10-24 ENCOUNTER — Other Ambulatory Visit: Payer: Self-pay | Admitting: Family Medicine

## 2024-10-24 DIAGNOSIS — I129 Hypertensive chronic kidney disease with stage 1 through stage 4 chronic kidney disease, or unspecified chronic kidney disease: Secondary | ICD-10-CM

## 2024-10-24 NOTE — Telephone Encounter (Unsigned)
 Copied from CRM #[id number]. Topic: Clinical - Medication Refill >> Oct 24, 2024 10:59 AM Lonell PEDLAR wrote: Medication:  hydrochlorothiazide  (HYDRODIURIL ) 12.5 MG tablet   Has the patient contacted their pharmacy? Yes, advised pt to call office  This is the patient's preferred pharmacy:  [id number] [place], [place] - [address] [place] [address] Phone: [phone] Fax: [phone]  Is this the correct pharmacy for this prescription? Yes If no, delete pharmacy and type the correct one.   Has the prescription been filled recently? Yes  Is the patient out of the medication? Yes  Has the patient been seen for an appointment in the last year OR does the patient have an upcoming appointment? Yes  Can we respond through MyChart? No  Agent: Please be advised that Rx refills may take up to 3 business days. We ask that you follow-up with your pharmacy.

## 2024-10-25 MED ORDER — HYDROCHLOROTHIAZIDE 12.5 MG PO TABS: ORAL_TABLET | ORAL | 1 refills | Status: AC

## 2024-10-25 NOTE — Telephone Encounter (Signed)
 Requested medication (s) are due for refill today: yes  Requested medication (s) are on the active medication list: yes  Last refill:  n/a  Future visit scheduled: no  Notes to clinic:  Unable to refill per protocol, Rx expired.      Requested Prescriptions  Pending Prescriptions Disp Refills   hydrochlorothiazide  (HYDRODIURIL ) 12.5 MG tablet      Sig: Take half tab daily     Cardiovascular: Diuretics - Thiazide Failed - 10/25/2024  4:10 PM      Failed - Cr in normal range and within 180 days    Creat  Date Value Ref Range Status  02/24/2021 2.69 (H) 0.70 - 1.11 mg/dL Final    Comment:    For patients >11 years of age, the reference limit for Creatinine is approximately 13% higher for people identified as African-American. .    Creatinine, Ser  Date Value Ref Range Status  03/23/2024 2.57 (H) 0.61 - 1.24 mg/dL Final         Failed - K in normal range and within 180 days    Potassium  Date Value Ref Range Status  03/23/2024 4.3 3.5 - 5.1 mmol/L Final         Failed - Na in normal range and within 180 days    Sodium  Date Value Ref Range Status  03/23/2024 138 135 - 145 mmol/L Final  11/07/2018 141 134 - 144 mmol/L Final         Failed - Last BP in normal range    BP Readings from Last 1 Encounters:  09/03/24 (!) 144/60         Failed - Valid encounter within last 6 months    Recent Outpatient Visits           2 months ago CAFL (chronic airflow limitation) [facility])   [place] [facility] [place], Marsa PARAS, DO   6 months ago GAD (generalized anxiety disorder)   [place] [facility] [place] Marsa PARAS, DO   8 months ago GAD (generalized anxiety disorder)   [place] [facility] [place] Marsa PARAS, DO       Future Appointments             In 2 months Hester Alm BROCKS, MD [facility]

## 2024-10-25 NOTE — Telephone Encounter (Signed)
 Not current dosing of this medication and listed as historical Requested Prescriptions  Pending Prescriptions Disp Refills   hydrochlorothiazide  (HYDRODIURIL ) 25 MG tablet [Pharmacy Med Name: HYDROCHLOROTHIAZIDE  25 MG TAB] 90 tablet 2    Sig: TAKE 1 TABLET BY MOUTH EVERY DAY     Cardiovascular: Diuretics - Thiazide Failed - 10/25/2024  1:27 PM      Failed - Cr in normal range and within 180 days    Creat  Date Value Ref Range Status  02/24/2021 2.69 (H) 0.70 - 1.11 mg/dL Final    Comment:    For patients >67 years of age, the reference limit for Creatinine is approximately 13% higher for people identified as African-American. .    Creatinine, Ser  Date Value Ref Range Status  03/23/2024 2.57 (H) 0.61 - 1.24 mg/dL Final         Failed - K in normal range and within 180 days    Potassium  Date Value Ref Range Status  03/23/2024 4.3 3.5 - 5.1 mmol/L Final         Failed - Na in normal range and within 180 days    Sodium  Date Value Ref Range Status  03/23/2024 138 135 - 145 mmol/L Final  11/07/2018 141 134 - 144 mmol/L Final         Failed - Last BP in normal range    BP Readings from Last 1 Encounters:  09/03/24 (!) 144/60         Failed - Valid encounter within last 6 months    Recent Outpatient Visits           2 months ago CAFL (chronic airflow limitation) [facility])   [place] [facility] [place], Marsa PARAS, DO   6 months ago GAD (generalized anxiety disorder)   [place] [facility] [place] Marsa PARAS, DO   8 months ago GAD (generalized anxiety disorder)    [facility] [place] Marsa PARAS, DO       Future Appointments             In 2 months Hester Alm BROCKS, MD [facility]

## 2024-10-26 ENCOUNTER — Encounter: Payer: Self-pay | Admitting: Family Medicine

## 2024-10-26 ENCOUNTER — Ambulatory Visit: Admitting: Family Medicine

## 2024-10-26 VITALS — BP 122/78 | HR 68 | Ht 66.0 in | Wt 182.5 lb

## 2024-10-26 DIAGNOSIS — Z23 Encounter for immunization: Secondary | ICD-10-CM

## 2024-10-26 DIAGNOSIS — Z794 Long term (current) use of insulin: Secondary | ICD-10-CM | POA: Diagnosis not present

## 2024-10-26 DIAGNOSIS — E1121 Type 2 diabetes mellitus with diabetic nephropathy: Secondary | ICD-10-CM | POA: Diagnosis not present

## 2024-10-26 DIAGNOSIS — I129 Hypertensive chronic kidney disease with stage 1 through stage 4 chronic kidney disease, or unspecified chronic kidney disease: Secondary | ICD-10-CM

## 2024-10-26 DIAGNOSIS — N184 Chronic kidney disease, stage 4 (severe): Secondary | ICD-10-CM

## 2024-10-26 NOTE — Patient Instructions (Addendum)
 Thank you for coming to the office today.  Keep up with your specialists  Apt with Endocrine 11/19/24 [facility]  Encounter Start  11/19/2024  2:00 PM   Encounter End  11/19/2024  2:30 PM   Admission Type  --      Patient Class: -- Authorization Number: --  Pre-Certification Number: --     Providers  Brendia Calton Squires, PA Endocrinology NPI: [id number] [address] [place] [address]   Phone: [phone]      Other specialist apts coming up   Nephrology appointment reminder.  Encounter Start  02/04/2025  1:00 PM   Encounter End  02/04/2025  1:20 PM   Admission Type  --      Patient Class: -- Authorization Number: --  Pre-Certification Number: --     Providers  Dennise Capri, MD Nephrology NPI: [id number] [place] [id number]   Phone: [phone]    Please schedule a Follow-up Appointment to: Return in about 6 months (around 04/25/2025) for 6 month fasting lab > 1 week later Annual Physical.  If you have any other questions or concerns, please feel free to call the office or send a message through MyChart. You may also schedule an earlier appointment if necessary.  Additionally, you may be receiving a survey about your experience at our office within a few days to 1 week by e-mail or mail. We value your feedback.  Marsa Officer, DO [facility], [place]

## 2024-10-26 NOTE — Progress Notes (Signed)
 Subjective:    Patient ID: Stephen White, male    DOB: 1936-10-30, 88 y.o.   MRN: [id number]  Stephen White is a 88 y.o. male presenting on 10/26/2024 for Medical Management of Chronic Issues   HPI  Discussed the use of AI scribe software for clinical note transcription with the patient, who gave verbal consent to proceed.  History of Present Illness   Stephen White is an 88 year old male who presents for a routine follow-up visit.  Nausea - Experienced nausea two months ago, attributed to dietary triggers - Nausea has since resolved  R Shoulder pain - Shoulder pain has improved and is not currently bothersome  HTN CKD IV  Controlled on medication Followed by Renal Last lab with eGFR 25  Hyperventilation Chronic Hyperventilation Asthma CAFL Previously followed by Pulm 2024, they no longer see him, nothing else to offer - History of using albuterol  and Xopenex  inhalers - Currently without any rescue inhalers Request re order albuterol    Type 2 Diabetes Diabetes managed by [facility] On insulin  No Hypoglycemia reported  Health Maintenance: Flu Shot     03/30/2024   11:10 AM 04/29/2023    9:54 AM 02/25/2023    3:23 PM  Depression screen PHQ 2/9  Decreased Interest 0 0 0  Down, Depressed, Hopeless 0 0 0  PHQ - 2 Score 0 0 0  Altered sleeping 0 2 0  Tired, decreased energy 0 3 0  Change in appetite 0 0 0  Feeling bad or failure about yourself  0 0 0  Trouble concentrating 0 2 0  Moving slowly or fidgety/restless 0 2 0  Suicidal thoughts 0 0 0  PHQ-9 Score 0  9  0   Difficult doing work/chores Not difficult at all Not difficult at all Not difficult at all     Data saved with a previous flowsheet row definition       04/29/2023    9:54 AM  GAD 7 : Generalized Anxiety Score  Nervous, Anxious, on Edge 3  Control/stop worrying 0  Worry too much - different things 0  Trouble relaxing 0  Restless 0  Easily annoyed or irritable 2   Afraid - awful might happen 0  Total GAD 7 Score 5    Social History   Tobacco Use   Smoking status: Former    Current packs/day: 0.00    Average packs/day: 0.3 packs/day for 25.0 years (7.5 ttl pk-yrs)    Types: Cigarettes    Start date: 12/19/1953    Quit date: 12/19/1978    Years since quitting: 45.8   Smokeless tobacco: Former  Building Services Engineer status: Never Used  Substance Use Topics   Alcohol use: Yes    Alcohol/week: 0.0 standard drinks of alcohol   Drug use: No    Review of Systems Per HPI unless specifically indicated above     Objective:    BP 122/78 (BP Location: Left Arm, Patient Position: Sitting, Cuff Size: Normal)   Pulse 68   Ht 5' 6 (1.676 m)   Wt 182 lb 8 oz (82.8 kg)   SpO2 97%   BMI 29.46 kg/m   Wt Readings from Last 3 Encounters:  10/26/24 182 lb 8 oz (82.8 kg)  09/03/24 184 lb 4 oz (83.6 kg)  08/22/24 185 lb (83.9 kg)    Physical Exam Vitals and nursing note reviewed.  Constitutional:      General: He is not in acute  distress.    Appearance: He is well-developed. He is not diaphoretic.     Comments: Well-appearing, comfortable, cooperative  HENT:     Head: Normocephalic and atraumatic.  Eyes:     General:        Right eye: No discharge.        Left eye: No discharge.     Conjunctiva/sclera: Conjunctivae normal.  Neck:     Thyroid : No thyromegaly.  Cardiovascular:     Rate and Rhythm: Normal rate and regular rhythm.     Pulses: Normal pulses.     Heart sounds: Normal heart sounds. No murmur heard. Pulmonary:     Effort: Pulmonary effort is normal. No respiratory distress.     Breath sounds: Normal breath sounds. No wheezing or rales.  Musculoskeletal:        General: Normal range of motion.     Cervical back: Normal range of motion and neck supple.  Lymphadenopathy:     Cervical: No cervical adenopathy.  Skin:    General: Skin is warm and dry.     Findings: No erythema or rash.  Neurological:     Mental Status: He is  alert and oriented to person, place, and time. Mental status is at baseline.  Psychiatric:        Behavior: Behavior normal.     Comments: Well groomed, good eye contact, normal speech and thoughts     Results for orders placed or performed during the hospital encounter of 09/12/24  NM Myocar Multi W/Spect W/Wall Motion / EF   Collection Time: 09/12/24 12:23 PM  Result Value Ref Range   Rest HR 65.0 bpm   Rest BP 183/76 mmHg   Peak HR 90 bpm   Peak BP 189/53 mmHg   MPHR 135 bpm   Percent HR 66.0 %   ST Depression (mm) 0 mm   Rest Nuclear Isotope Dose 10.5 mCi   Stress Nuclear Isotope Dose 32.8 mCi   SSS 0.0    SRS 4.0    SDS 0.0    TID 1.27    LV sys vol 23.0 4.2 - 5.8 mL   LV dias vol 58.0 62 - 150 mL   Nuc Stress EF 60 %      Assessment & Plan:   Problem List Items Addressed This Visit     Benign hypertension with CKD (chronic kidney disease) stage IV (HCC)   Flu vaccine need   Relevant Orders   Flu vaccine HIGH DOSE PF(Fluzone Trivalent) (Completed)   Type 2 diabetes mellitus with diabetic nephropathy, with long-term current use of insulin  (HCC) - Primary   Other Visit Diagnoses       Long term current use of insulin  (HCC)            Type 2 diabetes mellitus with diabetic nephropathy Blood sugar levels well-controlled with insulin . Endocrinology and nephrology follow-ups scheduled. - Continue current insulin  regimen. - No recent hypoglycemia, has GVOKE - Attend endocrinology appointment on December 1st. - Attend nephrology appointment on February 16th. - Complete blood work a week prior to May appointment.  General Health Maintenance Routine health maintenance and flu vaccination discussed. - Continue routine health maintenance. - Maintain balanced diet and lifestyle.     Hypertension / CKD IV BP Controlled Followed by Nephrology Most recent chemistry per nephrology 09/2024 showed Cr 2.246 and eGFR up to 25    Orders Placed This Encounter   Procedures   Flu vaccine HIGH DOSE PF(Fluzone Trivalent)  No orders of the defined types were placed in this encounter.   Follow up plan: Return in about 6 months (around 04/25/2025) for 6 month fasting lab > 1 week later Annual Physical.  Future labs ordered for 04/2025   Marsa Officer, DO [facility] 10/26/2024, 2:38 PM

## 2024-10-27 ENCOUNTER — Other Ambulatory Visit: Payer: Self-pay | Admitting: Family Medicine

## 2024-10-27 DIAGNOSIS — E1169 Type 2 diabetes mellitus with other specified complication: Secondary | ICD-10-CM

## 2024-10-27 DIAGNOSIS — Z0000 Encounter for general adult medical examination without abnormal findings: Secondary | ICD-10-CM

## 2024-10-27 DIAGNOSIS — Z794 Long term (current) use of insulin: Secondary | ICD-10-CM

## 2024-10-27 DIAGNOSIS — I129 Hypertensive chronic kidney disease with stage 1 through stage 4 chronic kidney disease, or unspecified chronic kidney disease: Secondary | ICD-10-CM

## 2024-10-27 DIAGNOSIS — R351 Nocturia: Secondary | ICD-10-CM

## 2024-12-03 ENCOUNTER — Other Ambulatory Visit: Admitting: Pharmacist

## 2024-12-03 DIAGNOSIS — Z794 Long term (current) use of insulin: Secondary | ICD-10-CM

## 2024-12-03 DIAGNOSIS — E1121 Type 2 diabetes mellitus with diabetic nephropathy: Secondary | ICD-10-CM

## 2024-12-03 NOTE — Progress Notes (Signed)
 12/03/2024 Name: Stephen White MRN: [id number] DOB: 1936-04-03  Chief Complaint  Patient presents with   Medication Assistance   Medication Management    Stephen White is a 88 y.o. year old male who presented for a telephone visit.   They were referred to the pharmacist by PCP for assistance in managing diabetes, hypertension, hyperlipidemia, and medication access.      Subjective:   Care Team: Primary Care Provider: [place] Marsa PARAS, DO; Next Scheduled Visit: 05/03/2025 Cardiologist: Ammon Marsa, MD Endocrinologist Brendia Calton Squires, [place]; Next Scheduled Visit: 05/22/2025 Pulmonologist: Orlie Madelin RAMAN, NP Nephrologist: Dominica Brandy, MD; Next Scheduled Visit: 02/04/2025 Dermatologist: Hester Alm BROCKS, MD Neurologist: Maree Jannett Hering, MD  Medication Access/Adherence  Current Pharmacy:  [facility] [phone] [place], [place] - [address] [place] [address] Phone: [phone] Fax: [phone]  [facility] [place], [place] - [address] [place] [address] Phone: [phone] Fax: [phone]  [id number] [place], [place] - [address] AT [facility] [address] [place] [address] [place] [address] Phone: [phone] Fax: [phone]  [facility] [address] [place] [address] Phone: [phone] Fax: [phone]   Patient reports affordability concerns with their medications: No  Patient reports access/transportation concerns to their pharmacy: No  Patient reports adherence concerns with their medications:  No       Diabetes:   Patient followed by [facility] Endocrinology. Per review of Office Visit note from Endocrinologist from 12/1, provider advised patient: continue current regimen of Tresiba  15 units QHS and Novolog  10-15 units BEFORE meals. SKIP Novolog  if pre-meal sugar  is <100   Current medications:  Tresiba  15 units nightly NovoLog  15-20 units with meals as directed by Endocrinologist Notes that he is to skip dose if his blood sugar before meals is under 100    Patient using FreeStyle Libre continuous glucose monitor (CGM) with reader device   Recalls reading today after breakfast: 266 (ate: eggs, sausage and small biscuit with black coffee)   Denies symptoms of hypoglycemia, such as shaking, sweating or dizziness - Note has glucose tablets that he carries with him to use if needed for hypoglycemia   Current medication access support: Patient enrolled in [name] patient assistance program for Tresiba  and Novolog  through 12/19/2024 Patient collaborating with CPhT [place] to apply for re-enrollment in [name] patient assistance program. Per update message from CPhT today, patient was required to complete [employer] subsidy application for this program  Today find that patient has received denial letter for Extra Help from [employer] in the mail  Objective:  Lab Results  Component Value Date   HGBA1C 8.7 04/13/2022  Per shared record with [facility], patient's A1C was 8.4% on 11/19/2024  Lab Results  Component Value Date   CREATININE 2.57 (H) 03/23/2024   BUN 45 (H) 03/23/2024   NA 138 03/23/2024   K 4.3 03/23/2024   CL 107 03/23/2024   CO2 24 03/23/2024    Lab Results  Component Value Date   CHOL 152 08/01/2020   HDL 56 08/01/2020   LDLCALC 82 08/01/2020   TRIG 68 08/01/2020   CHOLHDL 2.7 08/01/2020    Medications Reviewed Today     Reviewed by Alana Sharyle LABOR, RPH-CPP (Pharmacist) on 12/03/24 at 1348  Med List Status: <None>  Medication Order Taking? Sig Documenting Provider Last Dose Status Informant  albuterol  (VENTOLIN  HFA) 108 (90 Base) MCG/ACT inhaler [id number]  Inhale 2 puffs into the lungs every 4 (four) hours as needed for wheezing or shortness of breath. Edman Marsa PARAS, DO  Active   ALPRAZolam  (XANAX ) 0.5 MG tablet [id number]  Take 1 tablet (0.5 mg total) by mouth 2 (two) times daily as needed for anxiety. May start with half pill for 0.25mg  dose. May increase in future if needed. Edman Marsa PARAS, DO  Active Self  aspirin  81 MG EC tablet [id number]  Take 81 mg by mouth daily [provider]  Active Self  Blood Glucose Monitoring Suppl (ONETOUCH VERIO FLEX SYSTEM) w/Device KIT [id number]  Use to check blood sugar up to twice daily as directed Edman Marsa PARAS, DO  Active Self  Carboxymethylcellulose Sodium (EYE DROPS OP) [id number]  Apply to eye. [provider]  Active Self  Continuous Glucose Sensor (FREESTYLE LIBRE 3 SENSOR) MISC [id number]  1 each by Does not apply route every 14 (fourteen) days. [provider]  Active   gabapentin  (NEURONTIN ) 300 MG capsule [id number]  Take 300 mg by mouth as needed. [provider]  Active Self  glucose blood (ONETOUCH VERIO) test strip [id number]  Use to check blood sugar twice daily Edman Marsa PARAS, DO  Active Self  GVOKE HYPOPEN  2-PACK 1 MG/0.2ML EMMANUEL [id number]  Inject 1 mg into the skin as needed (hypoglycemia). Edman Marsa PARAS, DO  Active Self  hydrochlorothiazide  (HYDRODIURIL ) 12.5 MG tablet [id number]  Take half tab daily Edman Marsa PARAS, DO  Active   insulin  aspart (NOVOLOG ) 100 UNIT/ML injection [id number] Yes Inject 15-20 Units into the skin 3 (three) times daily before meals. [provider]  Active   insulin  degludec (TRESIBA ) 100 UNIT/ML FlexTouch Pen [id number] Yes Inject 15 Units into the skin at bedtime. [provider]  Active Self  Lancets JANETT [place] PLUS Tool) MISC [id number]  Use to check blood sugar twice daily Edman Marsa PARAS, DO  Active Self  losartan  (COZAAR ) 50 MG tablet [id number]  TAKE 1 TABLET BY MOUTH EVERY DAY Edman Marsa PARAS, DO  Active   Multiple Vitamins-Minerals (PX COMPLETE  SENIOR MULTIVITS) TABS [id number]  Take by mouth. [provider]  Active Self           Med Note HUMBERTO, JHONNIE RAMAN   Fri Mar 30, 2024 11:09 AM) PRESERVISION IN IT   SALINE MIST [place] [id number]  Place into the nose. [provider]  Active Self  vitamin B-12 (CYANOCOBALAMIN) 500 MCG tablet [id number]  Take 500 mcg by mouth daily. [provider]  Active Self              Assessment/Plan:   Diabetes: - Have counseled on s/s of low blood sugar, prevention of lows and how to manage lows Patient using Freestyle Libre 3 Plus monitor for feedback on dietary choices Patient carries glucose tablets to have in case of hypoglycemia - Recommend to continue to use Freestyle Libre CGM to monitor blood sugar/as feedback on dietary choices -Recommend to check glucose with fingerstick check when needed for symptoms and as back up to CGM.  Patient to contact office if needed for readings outside of established parameters or symptoms - Patient to follow up with [name] patient assistance program as needed for refills of his Novolog , Tresiba  and pen needles - Patient collaborating with CPhT [place] for support with applying for re-enrollment in [name]  patient assistance program Provide patient with contact info for [place]. States will bring Extra Help denial letter to office tomorrow to be faxed to her    Follow Up Plan: Clinical Pharmacist will follow up with patient by telephone 03/04/2025 at 1:00 PM    Sharyle Sia, PharmD, JAQUELINE, CPP Clinical Pharmacist [facility] [phone]

## 2024-12-03 NOTE — Patient Instructions (Signed)
 Goals Addressed             This Visit's Progress    Pharmacy Goals       If you need to reach out to patient assistance programs regarding refills or to find out the status of your application, you can do so by calling:  [name] at [phone]  Our goal A1c is less than 7%. This corresponds with fasting sugars less than 130 and 2 hour after meal sugars less than 180. Please continue to monitor home blood sugar.  Please check your home blood pressure, keep a log of the results and have this record for [place] to review during our next telephone appointment  Feel free to call me with any questions or concerns. I look forward to our next call!  Estelle Grumbles, PharmD, P[place]y [place], CPP Clinical Pharmacist [facility] [phone]

## 2024-12-05 NOTE — Telephone Encounter (Signed)
 Patient provided LIS letter of determination and it was faxed along with PAP application to [name] for review.

## 2024-12-07 NOTE — Telephone Encounter (Signed)
 PAP: Patient assistance application for Novolog  and Tresiba  has been approved by PAP Companies: [employer] from 12/20/2024 to 12/19/2025. Medication should be delivered to PAP Delivery: Provider's office. For further shipping updates, please contact [name] at [phone]. Patient ID is: [id number]

## 2024-12-25 ENCOUNTER — Ambulatory Visit: Admitting: Dermatology

## 2025-03-04 ENCOUNTER — Other Ambulatory Visit

## 2025-04-05 ENCOUNTER — Ambulatory Visit

## 2025-04-10 ENCOUNTER — Ambulatory Visit

## 2025-04-26 ENCOUNTER — Other Ambulatory Visit

## 2025-05-03 ENCOUNTER — Encounter: Admitting: Family Medicine

## 2025-10-22 ENCOUNTER — Other Ambulatory Visit

## 2025-10-29 ENCOUNTER — Encounter: Admitting: Family Medicine
